# Patient Record
Sex: Female | Born: 1935
Health system: Southern US, Community
[De-identification: ages and names within clinical notes are randomized; demographics above are authoritative.]

## PROBLEM LIST (undated history)

## (undated) DIAGNOSIS — T4145XA Adverse effect of unspecified anesthetic, initial encounter: Secondary | ICD-10-CM

## (undated) DIAGNOSIS — I491 Atrial premature depolarization: Secondary | ICD-10-CM

## (undated) DIAGNOSIS — Z8619 Personal history of other infectious and parasitic diseases: Secondary | ICD-10-CM

## (undated) DIAGNOSIS — R002 Palpitations: Secondary | ICD-10-CM

## (undated) DIAGNOSIS — M1990 Unspecified osteoarthritis, unspecified site: Secondary | ICD-10-CM

## (undated) DIAGNOSIS — Z87442 Personal history of urinary calculi: Secondary | ICD-10-CM

## (undated) DIAGNOSIS — N200 Calculus of kidney: Secondary | ICD-10-CM

## (undated) DIAGNOSIS — Z8679 Personal history of other diseases of the circulatory system: Secondary | ICD-10-CM

## (undated) DIAGNOSIS — N201 Calculus of ureter: Secondary | ICD-10-CM

## (undated) DIAGNOSIS — M419 Scoliosis, unspecified: Secondary | ICD-10-CM

## (undated) DIAGNOSIS — Z972 Presence of dental prosthetic device (complete) (partial): Secondary | ICD-10-CM

## (undated) DIAGNOSIS — R251 Tremor, unspecified: Secondary | ICD-10-CM

## (undated) DIAGNOSIS — G709 Myoneural disorder, unspecified: Secondary | ICD-10-CM

## (undated) DIAGNOSIS — C801 Malignant (primary) neoplasm, unspecified: Secondary | ICD-10-CM

## (undated) DIAGNOSIS — T8859XA Other complications of anesthesia, initial encounter: Secondary | ICD-10-CM

## (undated) DIAGNOSIS — R350 Frequency of micturition: Secondary | ICD-10-CM

## (undated) DIAGNOSIS — R3915 Urgency of urination: Secondary | ICD-10-CM

## (undated) DIAGNOSIS — R5383 Other fatigue: Secondary | ICD-10-CM

## (undated) DIAGNOSIS — I10 Essential (primary) hypertension: Secondary | ICD-10-CM

## (undated) DIAGNOSIS — R911 Solitary pulmonary nodule: Secondary | ICD-10-CM

## (undated) DIAGNOSIS — J309 Allergic rhinitis, unspecified: Secondary | ICD-10-CM

## (undated) DIAGNOSIS — M545 Low back pain, unspecified: Secondary | ICD-10-CM

## (undated) DIAGNOSIS — I499 Cardiac arrhythmia, unspecified: Secondary | ICD-10-CM

## (undated) HISTORY — DX: Tremor, unspecified: R25.1

## (undated) HISTORY — DX: Scoliosis, unspecified: M41.9

## (undated) HISTORY — PX: TOTAL ABDOMINAL HYSTERECTOMY: SHX209

## (undated) HISTORY — DX: Low back pain, unspecified: M54.50

## (undated) HISTORY — DX: Other fatigue: R53.83

## (undated) HISTORY — PX: OTHER SURGICAL HISTORY: SHX169

---
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
## 2007-03-14 ENCOUNTER — Ambulatory Visit: Payer: Self-pay | Admitting: Cardiology

## 2008-10-02 HISTORY — PX: CATARACT EXTRACTION W/ INTRAOCULAR LENS  IMPLANT, BILATERAL: SHX1307

## 2011-04-10 ENCOUNTER — Other Ambulatory Visit (HOSPITAL_COMMUNITY): Payer: Self-pay | Admitting: Urology

## 2011-04-10 ENCOUNTER — Ambulatory Visit (HOSPITAL_COMMUNITY)
Admission: RE | Admit: 2011-04-10 | Discharge: 2011-04-10 | Disposition: A | Discharge status: Home / Self Care | Payer: Medicare Other | Source: Ambulatory Visit | Attending: Urology | Admitting: Urology

## 2011-04-10 DIAGNOSIS — N200 Calculus of kidney: Secondary | ICD-10-CM | POA: Insufficient documentation

## 2011-04-10 DIAGNOSIS — R109 Unspecified abdominal pain: Secondary | ICD-10-CM | POA: Insufficient documentation

## 2011-10-03 HISTORY — PX: EXTRACORPOREAL SHOCK WAVE LITHOTRIPSY: SHX1557

## 2012-02-13 HISTORY — PX: TRANSTHORACIC ECHOCARDIOGRAM: SHX275

## 2012-04-03 ENCOUNTER — Encounter: Payer: Self-pay | Admitting: Cardiology

## 2012-04-08 ENCOUNTER — Ambulatory Visit: Payer: Medicare Other | Admitting: Cardiology

## 2012-05-06 ENCOUNTER — Ambulatory Visit (INDEPENDENT_AMBULATORY_CARE_PROVIDER_SITE_OTHER): Payer: Medicare Other | Admitting: Cardiology

## 2012-05-06 ENCOUNTER — Encounter: Payer: Self-pay | Admitting: Cardiology

## 2012-05-06 VITALS — BP 180/80 | HR 62 | Ht 66.0 in | Wt 157.0 lb

## 2012-05-06 DIAGNOSIS — I491 Atrial premature depolarization: Secondary | ICD-10-CM

## 2012-05-06 DIAGNOSIS — I4891 Unspecified atrial fibrillation: Secondary | ICD-10-CM

## 2012-05-06 DIAGNOSIS — R002 Palpitations: Secondary | ICD-10-CM | POA: Insufficient documentation

## 2012-05-06 NOTE — Patient Instructions (Signed)
   24 hour holter monitor - applied today  Office will contact with results Follow up as needed

## 2012-05-06 NOTE — Progress Notes (Signed)
HPI The patient presents for evaluation of atrial fibrillation. She had this transiently while she was hospitalized recently for management of nephrolithiasis and bacteremia. I reviewed these hospital records. She developed bacteremia and sepsis. During her acute illness she did have an episode of atrial fibrillation with rapid ventricular rate. However, this converted quickly after treatment with Cardizem. She did have an echocardiogram which demonstrated a well preserved ejection fraction and no evidence of significant valvular abnormalities or wall motion abnormalities.  Since going home she has had no sustained tachypalpitations. However, she has had occasional skipped beats. She thinks that at times these might wake her from her sleep very she's experiencing them during this visit and was noted to have PACs on her EKG. She describes them as fleeting skipped beats. She does not describe presyncope or syncope. She's not been having any chest pressure, neck or arm discomfort. She's not been having any palpitations, presyncope or syncope. She hasn't been overly active since her hospitalization but she does her chores of daily living without symptoms. She has no shortness of breath, PND or orthopnea. She's had no weight gain or edema.  No Known Allergies  Current Outpatient Prescriptions  Medication Sig Dispense Refill  . atenolol (TENORMIN) 50 MG tablet Take 50 mg by mouth 2 (two) times daily.       . cholecalciferol (VITAMIN D) 1000 UNITS tablet Take 1,000 Units by mouth daily.      . fluticasone (FLONASE) 50 MCG/ACT nasal spray Place 2 sprays into the nose daily.      . hydrochlorothiazide (HYDRODIURIL) 25 MG tablet Take 25 mg by mouth daily.      . Multiple Vitamins-Minerals (MULTIVITAMIN WITH MINERALS) tablet Take 1 tablet by mouth daily.        Past Medical History  Diagnosis Date  . Calculus of kidney   . Acute pyelonephritis without lesion of renal medullary necrosis   . Atrial  fibrillation   . Anemia, unspecified     Past Surgical History  Procedure Date  . Cataract extraction   . Abdominal hysterectomy     Family History  Problem Relation Age of Onset  . Cancer      Lung and bladder  . Cancer      "Bone marrow cancer"    History   Social History  . Marital Status: Married    Spouse Name: N/A    Number of Children: 1  . Years of Education: N/A   Occupational History  .     Social History Main Topics  . Smoking status: Never Smoker   . Smokeless tobacco: Not on file  . Alcohol Use: Not on file  . Drug Use: Not on file  . Sexually Active: Not on file   Other Topics Concern  . Not on file   Social History Narrative  . No narrative on file    ROS:  Positive for seasonal allergies, frequent headaches, pain and swelling in her legs, fatigue. Otherwise as stated in the HPI and negative for all other systems.  PHYSICAL EXAM BP 180/80  Pulse 62  Ht 5\' 6"  (1.676 m)  Wt 157 lb (71.215 kg)  BMI 25.34 kg/m2 GENERAL:  Well appearing HEENT:  Pupils equal round and reactive, fundi not visualized, oral mucosa unremarkable NECK:  No jugular venous distention, waveform within normal limits, carotid upstroke brisk and symmetric, no bruits, no thyromegaly LYMPHATICS:  No cervical, inguinal adenopathy LUNGS:  Clear to auscultation bilaterally BACK:  No CVA tenderness CHEST:  Unremarkable HEART:  PMI not displaced or sustained,S1 and S2 within normal limits, no S3, no S4, no clicks, no rubs, no murmurs ABD:  Flat, positive bowel sounds normal in frequency in pitch, no bruits, no rebound, no guarding, no midline pulsatile mass, no hepatomegaly, no splenomegaly EXT:  2 plus pulses throughout, no edema, no cyanosis no clubbing SKIN:  No rashes no nodules NEURO:  Cranial nerves II through XII grossly intact, motor grossly intact throughout PSYCH:  Cognitively intact, oriented to person place and time  EKG:  Normal sinus rhythm, rate 60 to, axis within  normal limits, intervals within normal limits, premature atrial contractions, no acute ST-T wave changes. 05/06/2012  ASSESSMENT AND PLAN  Atrial fibrillation:  I suspect this is related to her acute illness. I'm going to apply a 24-hour monitor which will certainly be an imperfect screening for atrial fibrillation but will be helpful in quantifying her PACs. At this point there is no indication for anticoagulation for further evaluation.  PACs:  As above  Hypertension:  We discussed this and the need for a blood pressure diary. She'll let me know she is seeing frequent blood pressures above 1/90.

## 2013-10-02 HISTORY — PX: BACK SURGERY: SHX140

## 2014-08-09 ENCOUNTER — Inpatient Hospital Stay (HOSPITAL_COMMUNITY): Payer: Medicare HMO | Admitting: Registered Nurse

## 2014-08-09 ENCOUNTER — Encounter (HOSPITAL_COMMUNITY)
Admission: EM | Disposition: A | Discharge status: Home / Self Care | Payer: Self-pay | Source: Home / Self Care | Attending: Urology

## 2014-08-09 ENCOUNTER — Encounter (HOSPITAL_COMMUNITY): Payer: Self-pay | Admitting: Emergency Medicine

## 2014-08-09 ENCOUNTER — Observation Stay (HOSPITAL_COMMUNITY)
Admission: EM | Admit: 2014-08-09 | Discharge: 2014-08-10 | Disposition: A | Discharge status: Home / Self Care | Payer: Medicare HMO | Attending: Urology | Admitting: Urology

## 2014-08-09 ENCOUNTER — Emergency Department (HOSPITAL_COMMUNITY): Payer: Medicare HMO

## 2014-08-09 DIAGNOSIS — I4891 Unspecified atrial fibrillation: Secondary | ICD-10-CM | POA: Insufficient documentation

## 2014-08-09 DIAGNOSIS — N132 Hydronephrosis with renal and ureteral calculous obstruction: Secondary | ICD-10-CM | POA: Diagnosis not present

## 2014-08-09 DIAGNOSIS — Z79899 Other long term (current) drug therapy: Secondary | ICD-10-CM | POA: Insufficient documentation

## 2014-08-09 DIAGNOSIS — N200 Calculus of kidney: Secondary | ICD-10-CM

## 2014-08-09 DIAGNOSIS — D649 Anemia, unspecified: Secondary | ICD-10-CM | POA: Diagnosis not present

## 2014-08-09 DIAGNOSIS — N201 Calculus of ureter: Secondary | ICD-10-CM

## 2014-08-09 DIAGNOSIS — R109 Unspecified abdominal pain: Secondary | ICD-10-CM

## 2014-08-09 DIAGNOSIS — N12 Tubulo-interstitial nephritis, not specified as acute or chronic: Secondary | ICD-10-CM | POA: Insufficient documentation

## 2014-08-09 HISTORY — PX: CYSTOSCOPY/RETROGRADE/URETEROSCOPY: SHX5316

## 2014-08-09 LAB — URINALYSIS, ROUTINE W REFLEX MICROSCOPIC
Bilirubin Urine: NEGATIVE
Glucose, UA: NEGATIVE mg/dL
KETONES UR: NEGATIVE mg/dL
Nitrite: NEGATIVE
PROTEIN: 30 mg/dL — AB
Specific Gravity, Urine: 1.025 (ref 1.005–1.030)
Urobilinogen, UA: 0.2 mg/dL (ref 0.0–1.0)
pH: 6 (ref 5.0–8.0)

## 2014-08-09 LAB — CBC WITH DIFFERENTIAL/PLATELET
BASOS PCT: 0 % (ref 0–1)
Basophils Absolute: 0 10*3/uL (ref 0.0–0.1)
Eosinophils Absolute: 0 10*3/uL (ref 0.0–0.7)
Eosinophils Relative: 0 % (ref 0–5)
HCT: 38.9 % (ref 36.0–46.0)
Hemoglobin: 13.6 g/dL (ref 12.0–15.0)
Lymphocytes Relative: 9 % — ABNORMAL LOW (ref 12–46)
Lymphs Abs: 1.2 10*3/uL (ref 0.7–4.0)
MCH: 34.3 pg — ABNORMAL HIGH (ref 26.0–34.0)
MCHC: 35 g/dL (ref 30.0–36.0)
MCV: 98 fL (ref 78.0–100.0)
Monocytes Absolute: 1.3 10*3/uL — ABNORMAL HIGH (ref 0.1–1.0)
Monocytes Relative: 10 % (ref 3–12)
Neutro Abs: 10.5 10*3/uL — ABNORMAL HIGH (ref 1.7–7.7)
Neutrophils Relative %: 81 % — ABNORMAL HIGH (ref 43–77)
PLATELETS: 235 10*3/uL (ref 150–400)
RBC: 3.97 MIL/uL (ref 3.87–5.11)
RDW: 12.5 % (ref 11.5–15.5)
WBC: 13.1 10*3/uL — AB (ref 4.0–10.5)

## 2014-08-09 LAB — BASIC METABOLIC PANEL
Anion gap: 13 (ref 5–15)
BUN: 11 mg/dL (ref 6–23)
CALCIUM: 9.2 mg/dL (ref 8.4–10.5)
CO2: 25 mEq/L (ref 19–32)
CREATININE: 0.91 mg/dL (ref 0.50–1.10)
Chloride: 92 mEq/L — ABNORMAL LOW (ref 96–112)
GFR calc Af Amer: 69 mL/min — ABNORMAL LOW (ref >90)
GFR, EST NON AFRICAN AMERICAN: 59 mL/min — AB (ref >90)
Glucose, Bld: 143 mg/dL — ABNORMAL HIGH (ref 70–99)
Potassium: 3.1 mEq/L — ABNORMAL LOW (ref 3.7–5.3)
SODIUM: 130 meq/L — AB (ref 137–147)

## 2014-08-09 LAB — CBC
HCT: 37 % (ref 36.0–46.0)
Hemoglobin: 12.7 g/dL (ref 12.0–15.0)
MCH: 33.3 pg (ref 26.0–34.0)
MCHC: 34.3 g/dL (ref 30.0–36.0)
MCV: 97.1 fL (ref 78.0–100.0)
PLATELETS: 221 10*3/uL (ref 150–400)
RBC: 3.81 MIL/uL — ABNORMAL LOW (ref 3.87–5.11)
RDW: 12.5 % (ref 11.5–15.5)
WBC: 12.3 10*3/uL — ABNORMAL HIGH (ref 4.0–10.5)

## 2014-08-09 LAB — URINE MICROSCOPIC-ADD ON

## 2014-08-09 LAB — I-STAT CG4 LACTIC ACID, ED: LACTIC ACID, VENOUS: 1.69 mmol/L (ref 0.5–2.2)

## 2014-08-09 LAB — CREATININE, SERUM
CREATININE: 0.79 mg/dL (ref 0.50–1.10)
GFR calc Af Amer: >90 mL/min (ref >90)
GFR, EST NON AFRICAN AMERICAN: 78 mL/min — AB (ref >90)

## 2014-08-09 SURGERY — CYSTOSCOPY/RETROGRADE/URETEROSCOPY
Anesthesia: Monitor Anesthesia Care | Site: Ureter | Laterality: Left

## 2014-08-09 MED ORDER — DOCUSATE SODIUM 100 MG PO CAPS
100.0000 mg | ORAL_CAPSULE | Freq: Two times a day (BID) | ORAL | Status: DC
  Administered 2014-08-09 – 2014-08-10 (×2): 100 mg via ORAL
  Filled 2014-08-09 (×2): qty 1

## 2014-08-09 MED ORDER — ONDANSETRON HCL 4 MG/2ML IJ SOLN
4.0000 mg | Freq: Once | INTRAMUSCULAR | Status: AC
  Administered 2014-08-09: 4 mg via INTRAVENOUS
  Filled 2014-08-09: qty 2

## 2014-08-09 MED ORDER — KCL IN DEXTROSE-NACL 20-5-0.45 MEQ/L-%-% IV SOLN
INTRAVENOUS | Status: AC
  Filled 2014-08-09: qty 1000

## 2014-08-09 MED ORDER — HEPARIN SODIUM (PORCINE) 5000 UNIT/ML IJ SOLN
5000.0000 [IU] | Freq: Three times a day (TID) | INTRAMUSCULAR | Status: DC
  Administered 2014-08-09 – 2014-08-10 (×2): 5000 [IU] via SUBCUTANEOUS
  Filled 2014-08-09 (×3): qty 1

## 2014-08-09 MED ORDER — OXYBUTYNIN CHLORIDE 5 MG PO TABS
5.0000 mg | ORAL_TABLET | Freq: Three times a day (TID) | ORAL | Status: DC | PRN
  Filled 2014-08-09: qty 1

## 2014-08-09 MED ORDER — DEXTROSE 5 % IV SOLN
1.0000 g | Freq: Once | INTRAVENOUS | Status: AC
  Administered 2014-08-09: 1 g via INTRAVENOUS
  Filled 2014-08-09: qty 10

## 2014-08-09 MED ORDER — LIDOCAINE HCL 2 % EX GEL
CUTANEOUS | Status: DC | PRN
  Administered 2014-08-09: 1 via URETHRAL

## 2014-08-09 MED ORDER — HYDROMORPHONE HCL 1 MG/ML IJ SOLN
0.5000 mg | INTRAMUSCULAR | Status: AC | PRN
  Administered 2014-08-09 (×2): 0.5 mg via INTRAVENOUS
  Filled 2014-08-09 (×2): qty 1

## 2014-08-09 MED ORDER — SODIUM CHLORIDE 0.9 % IR SOLN
Status: DC | PRN
  Administered 2014-08-09: 3000 mL

## 2014-08-09 MED ORDER — DEXAMETHASONE SODIUM PHOSPHATE 10 MG/ML IJ SOLN
INTRAMUSCULAR | Status: DC | PRN
  Administered 2014-08-09: 10 mg via INTRAVENOUS

## 2014-08-09 MED ORDER — FENTANYL CITRATE 0.05 MG/ML IJ SOLN: 25.0000 ug | INTRAMUSCULAR | Status: DC | PRN

## 2014-08-09 MED ORDER — IOHEXOL 300 MG/ML  SOLN
INTRAMUSCULAR | Status: DC | PRN
  Administered 2014-08-09: 10 mL

## 2014-08-09 MED ORDER — DEXTROSE 5 % IV SOLN
1.0000 g | INTRAVENOUS | Status: DC
  Administered 2014-08-09: 1 g via INTRAVENOUS
  Filled 2014-08-09: qty 10

## 2014-08-09 MED ORDER — LACTATED RINGERS IV SOLN: INTRAVENOUS | Status: DC

## 2014-08-09 MED ORDER — ATENOLOL 50 MG PO TABS
50.0000 mg | ORAL_TABLET | Freq: Two times a day (BID) | ORAL | Status: DC
  Administered 2014-08-09 – 2014-08-10 (×2): 50 mg via ORAL
  Filled 2014-08-09 (×2): qty 1

## 2014-08-09 MED ORDER — HYDROCODONE-ACETAMINOPHEN 5-325 MG PO TABS
1.0000 | ORAL_TABLET | ORAL | Status: DC | PRN
  Administered 2014-08-10: 1 via ORAL
  Filled 2014-08-09: qty 2
  Filled 2014-08-09: qty 1

## 2014-08-09 MED ORDER — LIDOCAINE HCL 2 % EX GEL
CUTANEOUS | Status: AC
  Filled 2014-08-09: qty 10

## 2014-08-09 MED ORDER — MORPHINE SULFATE 2 MG/ML IJ SOLN
2.0000 mg | INTRAMUSCULAR | Status: DC | PRN
  Administered 2014-08-09: 2 mg via INTRAVENOUS
  Filled 2014-08-09: qty 1

## 2014-08-09 MED ORDER — PROMETHAZINE HCL 25 MG/ML IJ SOLN
12.5000 mg | Freq: Once | INTRAMUSCULAR | Status: AC
  Administered 2014-08-09: 12.5 mg via INTRAVENOUS
  Filled 2014-08-09: qty 1

## 2014-08-09 MED ORDER — ONDANSETRON HCL 4 MG/2ML IJ SOLN
4.0000 mg | INTRAMUSCULAR | Status: DC | PRN
Start: 2014-08-09 — End: 2014-08-10

## 2014-08-09 MED ORDER — KCL IN DEXTROSE-NACL 20-5-0.45 MEQ/L-%-% IV SOLN
INTRAVENOUS | Status: DC
  Administered 2014-08-10: 05:00:00 via INTRAVENOUS
  Filled 2014-08-09 (×3): qty 1000

## 2014-08-09 MED ORDER — PROPOFOL INFUSION 10 MG/ML OPTIME
INTRAVENOUS | Status: DC | PRN
  Administered 2014-08-09: 300 ug/kg/min via INTRAVENOUS

## 2014-08-09 MED ORDER — LACTATED RINGERS IV SOLN
INTRAVENOUS | Status: DC | PRN
  Administered 2014-08-09: 18:00:00 via INTRAVENOUS

## 2014-08-09 MED ORDER — HYDROCHLOROTHIAZIDE 25 MG PO TABS
25.0000 mg | ORAL_TABLET | Freq: Every day | ORAL | Status: DC
  Administered 2014-08-10: 25 mg via ORAL
  Filled 2014-08-09: qty 1

## 2014-08-09 MED ORDER — ACETAMINOPHEN 650 MG RE SUPP
650.0000 mg | Freq: Once | RECTAL | Status: AC
  Administered 2014-08-09: 650 mg via RECTAL
  Filled 2014-08-09: qty 1

## 2014-08-09 SURGICAL SUPPLY — 16 items
BAG URO CATCHER STRL LF (DRAPE) ×3 IMPLANT
BASKET ZERO TIP NITINOL 2.4FR (BASKET) IMPLANT
BSKT STON RTRVL ZERO TP 2.4FR (BASKET)
CATH INTERMIT  6FR 70CM (CATHETERS) ×2 IMPLANT
CATH URET 5FR 28IN CONE TIP (BALLOONS)
CATH URET 5FR 70CM CONE TIP (BALLOONS) IMPLANT
DRAPE CAMERA CLOSED 9X96 (DRAPES) ×3 IMPLANT
GLOVE BIOGEL M STRL SZ7.5 (GLOVE) ×3 IMPLANT
GOWN STRL REUS W/TWL LRG LVL3 (GOWN DISPOSABLE) ×3 IMPLANT
GOWN STRL REUS W/TWL XL LVL3 (GOWN DISPOSABLE) ×3 IMPLANT
MANIFOLD NEPTUNE II (INSTRUMENTS) ×3 IMPLANT
PACK CYSTO (CUSTOM PROCEDURE TRAY) ×3 IMPLANT
STENT CONTOUR 7FRX24 (STENTS) ×2 IMPLANT
TUBING CONNECTING 10 (TUBING) ×1 IMPLANT
TUBING CONNECTING 10' (TUBING) ×1
WIRE COONS/BENSON .038X145CM (WIRE) ×3 IMPLANT

## 2014-08-09 NOTE — Anesthesia Postprocedure Evaluation (Signed)
  Anesthesia Post-op Note  Patient: Tanya Mueller  Procedure(s) Performed: Procedure(s) (LRB): CYSTOSCOPY/RETROGRADE/LEFT STENT (Left)  Patient Location: PACU  Anesthesia Type: MAC  Level of Consciousness: awake and alert   Airway and Oxygen Therapy: Patient Spontanous Breathing  Post-op Pain: mild  Post-op Assessment: Post-op Vital signs reviewed, Patient's Cardiovascular Status Stable, Respiratory Function Stable, Patent Airway and No signs of Nausea or vomiting  Last Vitals:  Filed Vitals:   08/09/14 1845  BP: 135/48  Pulse: 69  Temp: 38.2 C  Resp: 17    Post-op Vital Signs: stable   Complications: No apparent anesthesia complications

## 2014-08-09 NOTE — ED Notes (Signed)
RN spoke with Seth Bake at [facility], states pt going directly to OR. [phone]. Information given to [facility].

## 2014-08-09 NOTE — ED Notes (Signed)
Contact Seth Bake @ [phone] when Karen Chafe is on the way.

## 2014-08-09 NOTE — ED Notes (Signed)
Pt ambulated to restroom with standby assist. Upon returning to room pt vomited small amount of yellow/brown emesis. Zofran given. VSS. nad noted.

## 2014-08-09 NOTE — ED Notes (Signed)
Opened chart at request of OR in order to assist with transfer from [facility] to OR.

## 2014-08-09 NOTE — Anesthesia Preprocedure Evaluation (Addendum)
Anesthesia Evaluation  Patient identified by MRN, date of birth, ID band Patient awake    Reviewed: Allergy & Precautions, H&P , NPO status , Patient's Chart, lab work & pertinent test results, reviewed documented beta blocker date and time   Airway Mallampati: II  TM Distance: >3 FB Neck ROM: full    Dental  (+) Missing, Dental Advisory Given Lower partial:   Pulmonary neg pulmonary ROS,  breath sounds clear to auscultation  Pulmonary exam normal       Cardiovascular Exercise Tolerance: Good + dysrhythmias Atrial Fibrillation Rhythm:regular Rate:Normal  ECG NSR   Neuro/Psych negative neurological ROS  negative psych ROS   GI/Hepatic negative GI ROS, Neg liver ROS,   Endo/Other  negative endocrine ROS  Renal/GU negative Renal ROS  negative genitourinary   Musculoskeletal   Abdominal   Peds  Hematology negative hematology ROS (+)   Anesthesia Other Findings   Reproductive/Obstetrics negative OB ROS                            Anesthesia Physical Anesthesia Plan  ASA: III  Anesthesia Plan: MAC   Post-op Pain Management:    Induction:   Airway Management Planned: Simple Face Mask  Additional Equipment:   Intra-op Plan:   Post-operative Plan:   Informed Consent: I have reviewed the patients History and Physical, chart, labs and discussed the procedure including the risks, benefits and alternatives for the proposed anesthesia with the patient or authorized representative who has indicated his/her understanding and acceptance.   Dental Advisory Given  Plan Discussed with: CRNA and Surgeon  Anesthesia Plan Comments:        Anesthesia Quick Evaluation

## 2014-08-09 NOTE — ED Notes (Addendum)
Pt reports posterior head pain of 6/10. Pt denies headache. Pt reports lateral and posterior neck pain of 6/10. Pt reports generalized weakness and nausea. Pt denies vomiting.

## 2014-08-09 NOTE — ED Provider Notes (Addendum)
CSN: [id number]     Arrival date & time 08/09/14  1026 History  This chart was scribed for Tanna Furry, MD by Martinique Peace, ED Scribe. The patient was seen in [place]. The patient's care was started at 11:35 AM.    Chief Complaint  Patient presents with  . Weakness      Patient is a 79 y.o. female presenting with weakness. The history is provided by the patient. No language interpreter was used.  Weakness Associated symptoms include abdominal pain. Pertinent negatives include no chest pain, no headaches and no shortness of breath.  HPI Comments: Tanya Mueller is a 79 y.o. female who presents to the Emergency Department complaining of generalized weakness and malaise  onset past week with associated fever (max: 103), diaphoresis, and lack of appetite. Pt reports she saw her PCP yesterday and was diagnosed with UTI which she was given antibiotic to treat (Macrobid). . She notes odor to urine. Reports history of kidney stones. Pt also complains of pain to posterior aspect of head and neck rated as 6/10. She states that she may be allergic to Morphine. No neck stiffness. Temperature up to 102 at home.   Past Medical History  Diagnosis Date  . Calculus of kidney   . Acute pyelonephritis without lesion of renal medullary necrosis   . Atrial fibrillation   . Anemia, unspecified    Past Surgical History  Procedure Laterality Date  . Cataract extraction    . Abdominal hysterectomy     Family History  Problem Relation Age of Onset  . Cancer      Lung and bladder  . Cancer      "Bone marrow cancer"   History  Substance Use Topics  . Smoking status: Never Smoker   . Smokeless tobacco: Not on file  . Alcohol Use: No   OB History    No data available     Review of Systems  Constitutional: Positive for fever, diaphoresis, appetite change and fatigue. Negative for chills.  HENT: Negative for mouth sores, sore throat and trouble swallowing.   Eyes: Negative for visual disturbance.   Respiratory: Negative for cough, chest tightness, shortness of breath and wheezing.   Cardiovascular: Negative for chest pain.  Gastrointestinal: Positive for abdominal pain. Negative for nausea, vomiting, diarrhea, blood in stool and abdominal distention.  Endocrine: Negative for polydipsia, polyphagia and polyuria.  Genitourinary: Negative for dysuria, frequency and hematuria.  Musculoskeletal: Positive for neck pain. Negative for gait problem.  Skin: Negative for color change, pallor and rash.  Neurological: Positive for weakness. Negative for dizziness, syncope, light-headedness and headaches.  Hematological: Does not bruise/bleed easily.  Psychiatric/Behavioral: Negative for behavioral problems and confusion.      Allergies  Review of patient's allergies indicates no known allergies.  Home Medications   Prior to Admission medications   Medication Sig Start Date End Date Taking? Authorizing Provider  acetaminophen-codeine (TYLENOL #3) 300-30 MG per tablet Take 1 tablet by mouth 4 (four) times daily as needed (pain).   Yes Historical Provider, MD  atenolol (TENORMIN) 50 MG tablet Take 50 mg by mouth 2 (two) times daily.    Yes Historical Provider, MD  cholecalciferol (VITAMIN D) 1000 UNITS tablet Take 1,000 Units by mouth daily.   Yes Historical Provider, MD  hydrochlorothiazide (HYDRODIURIL) 25 MG tablet Take 25 mg by mouth daily.   Yes Historical Provider, MD  Multiple Vitamins-Minerals (MULTIVITAMIN WITH MINERALS) tablet Take 1 tablet by mouth daily.   Yes Historical Provider, MD  nitrofurantoin, macrocrystal-monohydrate, (MACROBID) 100 MG capsule Take 100 mg by mouth 2 (two) times daily. Started 08/08/14   Yes Historical Provider, MD  ondansetron (ZOFRAN-ODT) 8 MG disintegrating tablet Take 8 mg by mouth every 6 (six) hours as needed for nausea.    Yes Historical Provider, MD  fluticasone (FLONASE) 50 MCG/ACT nasal spray Place 2 sprays into the nose daily as needed for allergies.      Historical Provider, MD   BP 129/101 mmHg  Pulse 71  Temp(Src) 99.3 F (37.4 C) (Oral)  Resp 12  Ht 5\' 5"  (1.651 m)  Wt 160 lb (72.576 kg)  BMI 26.63 kg/m2  SpO2 95% Physical Exam  Constitutional: She is oriented to person, place, and time. She appears well-developed and well-nourished. No distress.  HENT:  Head: Normocephalic.  Left ear effusion.   Eyes: Conjunctivae are normal. Pupils are equal, round, and reactive to light. No scleral icterus.  Neck: Normal range of motion. Neck supple. No thyromegaly present.  Neck pain but freely mobile.   Cardiovascular: Normal rate and regular rhythm.  Exam reveals no gallop and no friction rub.   No murmur heard. Pulmonary/Chest: Effort normal and breath sounds normal. No respiratory distress. She has no wheezes. She has no rales.  Abdominal: Soft. Bowel sounds are normal. She exhibits no distension. There is no tenderness. There is no rebound and no guarding.  Left lower quadrant tenderness.   Musculoskeletal: Normal range of motion.  Neurological: She is alert and oriented to person, place, and time.  Skin: Skin is warm and dry. No rash noted.  Psychiatric: She has a normal mood and affect. Her behavior is normal.    ED Course  Procedures (including critical care time) Labs Review Labs Reviewed  URINALYSIS, ROUTINE W REFLEX MICROSCOPIC - Abnormal; Notable for the following:    Color, Urine AMBER (*)    APPearance CLOUDY (*)    Hgb urine dipstick TRACE (*)    Protein, ur 30 (*)    Leukocytes, UA SMALL (*)    All other components within normal limits  CBC WITH DIFFERENTIAL - Abnormal; Notable for the following:    WBC 13.1 (*)    MCH 34.3 (*)    Neutrophils Relative % 81 (*)    Neutro Abs 10.5 (*)    Lymphocytes Relative 9 (*)    Monocytes Absolute 1.3 (*)    All other components within normal limits  BASIC METABOLIC PANEL - Abnormal; Notable for the following:    Sodium 130 (*)    Potassium 3.1 (*)    Chloride 92 (*)     Glucose, Bld 143 (*)    GFR calc non Af Amer 59 (*)    GFR calc Af Amer 69 (*)    All other components within normal limits  URINE MICROSCOPIC-ADD ON - Abnormal; Notable for the following:    Squamous Epithelial / LPF FEW (*)    Bacteria, UA MANY (*)    All other components within normal limits  I-STAT CG4 LACTIC ACID, ED    Imaging Review Ct Renal Stone Study  08/09/2014   CLINICAL DATA:  79 year old female with fever, decreased appetite and generalized weakness an Chad is. Reports past history of kidney stones.  EXAM: CT ABDOMEN AND PELVIS WITHOUT CONTRAST  TECHNIQUE: Multidetector CT imaging of the abdomen and pelvis was performed following the standard protocol without IV contrast.  COMPARISON:  CT scan of the pelvis 05/21/2014; prior CT of the abdomen 03/13/2012  FINDINGS: Lower Chest:  Dependent and subsegmental atelectasis in the lower lungs. Stable 3 mm pulmonary nodule in the periphery of the right middle lobe dating back to June of 2013. Two year stability is consistent with benignity. No other suspicious pulmonary nodules or masses. Visualized heart within normal limits for size. No pericardial effusion. Small hiatal hernia.  Abdomen: Unenhanced CT was performed per clinician order. Lack of IV contrast limits sensitivity and specificity, especially for evaluation of abdominal/pelvic solid viscera. Within these limitations, unremarkable CT appearance of the stomach, duodenum, spleen, adrenal glands and pancreas. Normal hepatic contour and morphology. No discrete hepatic lesion. Gallbladder is unremarkable. No intra or extrahepatic biliary ductal dilatation.  Moderate to severe left hydronephrosis secondary to a 7.5 mm obstructing stone in the proximal left ureter. There is associated renal edema and perinephric stranding. Additional nephrolithiasis present in the lower pole of the left kidney. The largest remaining stone measures 7 mm. Abnormal contour of the right kidney likely represents  the sequelae of the prior large subcapsular hematoma. No definite mass slight contour abnormality on the sagittal reformatted images. No evidence of right hydronephrosis or nephrolithiasis.  No evidence of focal bowel wall thickening or obstruction. The sigmoid colon is redundant. No free fluid or suspicious adenopathy.  Pelvis: Surgical changes of prior hysterectomy. Mild pelvic floor laxity. Unremarkable appearance of the bladder. No free fluid or suspicious adenopathy.  Bones/Soft Tissues: No acute fracture or aggressive appearing lytic or blastic osseous lesion. Multilevel degenerative disc disease most focal at L4-L5.  Vascular: Limited evaluation in the absence of intravenous contrast. Mild scattered atherosclerotic vascular calcifications without evidence of aneurysmal dilatation.  IMPRESSION: 1. Obstructing 7.5 x 9 mm stone in the proximal left ureter resulting in moderate to severe left hydronephrosis, renal edema and perinephric stranding. 2. Additional nonobstructing nephrolithiasis in the left lower pole collecting system. 3. Abnormal configuration and fullness within the central aspect of the right kidney which is incompletely evaluated in the absence of intravenous contrast material. However, on the prior CT scan dated 03/13/2012, this kidney was affected with a relatively large subcapsular hematoma. The current configuration the kidney is favored to represent the sequelae of the largely resolved subcapsular hematoma. 4. Small hiatal hernia. 5. Atherosclerotic vascular calcifications.   Electronically Signed   By: Jacqulynn Cadet M.D.   On: 08/09/2014 13:01     EKG Interpretation None     Medications  cefTRIAXone (ROCEPHIN) 1 g in dextrose 5 % 50 mL IVPB (1 g Intravenous New Bag/Given 08/09/14 1327)  HYDROmorphone (DILAUDID) injection 0.5 mg (0.5 mg Intravenous Given 08/09/14 1338)  ondansetron (ZOFRAN) injection 4 mg (4 mg Intravenous Given 08/09/14 1219)  ondansetron (ZOFRAN) injection 4 mg  (4 mg Intravenous Given 08/09/14 1300)    11:41 AM- Treatment plan was discussed with patient who verbalizes understanding and agrees.   MDM   Final diagnoses:  Flank pain  Kidney stone  Pyelonephritis    Findings discussed at length with patient and her family. I placed a call to [facility]. Patient follows with Dr. Larwance Sachs. Dr. Risa Grill returned my call. I presented her as a patient with urinary tract infection and flank pain with kidney stone. Hemodynamically stable and not currently septic. Dr. Risa Grill request transfer to [facility] long for stent placement. I think this is perfectly appropriate. Arrangements being made for transfer at this time.  I personally performed the services described in this documentation, which was scribed in my presence. The recorded information has been reviewed and is accurate.   Tanna Furry, MD  08/09/14 [place], MD 08/09/14 1354

## 2014-08-09 NOTE — Op Note (Signed)
Preoperative diagnosis:left proximal ureteral calculus 9 mm with febrile UTI Postoperative diagnosis:same  Procedure:cystoscopy, left retrograde pyelogram, left double-J stent placement 7 French 24 cm   Surgeon: Bernestine Amass M.D.  Anesthesia: Gen.  Indications:79 year old female with prior history of nephrolithiasis. She presented with fever lethargy and flank discomfort. CT revealed a nonobstructing 9 mm left proximal ureteral stone with high-grade obstruction and concurrent febrile UTI. She presents now for semi-emergent decompression.     Technique and findings:patient was brought to the operating room. There she received some IV sedation with monitoring. She was placed in lithotomy position and prepped and draped in usual manner. Appropriate surgical timeout was performed. Cystoscopy revealed some edema and erythema of the trigone consistent with cystitis. Left ureteral orifice was cannulated with an open-ended catheter. Retrograde pyelogram showed a 9 mm filling defect in the proximal left ureter causing high-grade obstruction with dilation of the calyceal system and renal pelvis. A guidewire was placed up to the renal pelvis.  The retrograde was performed with fluoroscopic interpretation. Over the guidewire I placed a 7 French 24 cm double-J stent with good positioning confirmed visually as well as with fluoroscopy. The bladder was emptied. She was brought to recovery room in stable condition.

## 2014-08-09 NOTE — ED Notes (Signed)
Pt reports headache x1 week. Pt reports was seen for same at pcp and diagnosed with UTI. Pt reports no relief in symptoms. Pt reports generalized weakness and malaise.

## 2014-08-09 NOTE — H&P (Signed)
Tanya Mueller is an 79 y.o. female.   Chief Complaint: newly diagnosed left proximal ureteral calculus with concurrent febrile urinary tract infection HPI: Tanya Mueller is 79 years of age. She has a long-standing history of recurrent nephrolithiasis. She was previously cared for by an outside urologist in [facility]. She more recently has been under the care of Tanya Mueller. She presented to the [facility] emergency room complaining of weakness with malaise and fever to as high as 103. She had shaking chills and was diaphoretic. She had significant nausea. She apparently was seen by her primary care physician yesterday and diagnosed with a urinary tract infection. She was given Macrobid. She was noted to be 99.3 in the emergency room with stable vital signs. She was not grossly uroseptic. Urinalysis did show too numerous to count white blood cells with many bacteria. White blood cell count was elevated at 13.1 thousand. Creatinine was 0.9. CT imaging revealed an obstructing 8 x 9 mm stone in the left proximal ureter causing moderate to severe hydronephrosis with what appeared to be renal edema and some perinephric inflammatory changes. Additional nonobstructing stones were noted in the lower pole the left kidney.urine culture was apparently obtained in the emergency room and the patient was given 1 g of Rocephin several hours ago. She appears to have remained hemodynamically stable and is without gross evidence of sepsis.  Past Medical History  Diagnosis Date  . Calculus of kidney   . Acute pyelonephritis without lesion of renal medullary necrosis   . Atrial fibrillation   . Anemia, unspecified     Past Surgical History  Procedure Laterality Date  . Cataract extraction    . Abdominal hysterectomy      Family History  Problem Relation Age of Onset  . Cancer      Lung and bladder  . Cancer      "Bone marrow cancer"   Social History:  reports that she has never smoked. She  does not have any smokeless tobacco history on file. She reports that she does not drink alcohol or use illicit drugs.  Allergies: No Known Allergies  Medications Prior to Admission  Medication Sig Dispense Refill  . acetaminophen-codeine (TYLENOL #3) 300-30 MG per tablet Take 1 tablet by mouth 4 (four) times daily as needed (pain).    [name] atenolol (TENORMIN) 50 MG tablet Take 50 mg by mouth 2 (two) times daily.     . cholecalciferol (VITAMIN D) 1000 UNITS tablet Take 1,000 Units by mouth daily.    . hydrochlorothiazide (HYDRODIURIL) 25 MG tablet Take 25 mg by mouth daily.    . Multiple Vitamins-Minerals (MULTIVITAMIN WITH MINERALS) tablet Take 1 tablet by mouth daily.    . nitrofurantoin, macrocrystal-monohydrate, (MACROBID) 100 MG capsule Take 100 mg by mouth 2 (two) times daily. Started 08/08/14    . ondansetron (ZOFRAN-ODT) 8 MG disintegrating tablet Take 8 mg by mouth every 6 (six) hours as needed for nausea.     . fluticasone (FLONASE) 50 MCG/ACT nasal spray Place 2 sprays into the nose daily as needed for allergies.       Results for orders placed or performed during the hospital encounter of 08/09/14 (from the past 48 hour(s))  CBC with Differential     Status: Abnormal   Collection Time: 08/09/14 12:20 PM  Result Value Ref Range   WBC 13.1 (H) 4.0 - 10.5 K/uL   RBC 3.97 3.87 - 5.11 MIL/uL   Hemoglobin 13.6 12.0 - 15.0 g/dL  HCT 38.9 36.0 - 46.0 %   MCV 98.0 78.0 - 100.0 fL   MCH 34.3 (H) 26.0 - 34.0 pg   MCHC 35.0 30.0 - 36.0 g/dL   RDW 12.5 11.5 - 15.5 %   Platelets 235 150 - 400 K/uL   Neutrophils Relative % 81 (H) 43 - 77 %   Neutro Abs 10.5 (H) 1.7 - 7.7 K/uL   Lymphocytes Relative 9 (L) 12 - 46 %   Lymphs Abs 1.2 0.7 - 4.0 K/uL   Monocytes Relative 10 3 - 12 %   Monocytes Absolute 1.3 (H) 0.1 - 1.0 K/uL   Eosinophils Relative 0 0 - 5 %   Eosinophils Absolute 0.0 0.0 - 0.7 K/uL   Basophils Relative 0 0 - 1 %   Basophils Absolute 0.0 0.0 - 0.1 K/uL  Basic metabolic  panel     Status: Abnormal   Collection Time: 08/09/14 12:20 PM  Result Value Ref Range   Sodium 130 (L) 137 - 147 mEq/L   Potassium 3.1 (L) 3.7 - 5.3 mEq/L   Chloride 92 (L) 96 - 112 mEq/L   CO2 25 19 - 32 mEq/L   Glucose, Bld 143 (H) 70 - 99 mg/dL   BUN 11 6 - 23 mg/dL   Creatinine, Ser 0.91 0.50 - 1.10 mg/dL   Calcium 9.2 8.4 - 10.5 mg/dL   GFR calc non Af Amer 59 (L) >90 mL/min   GFR calc Af Amer 69 (L) >90 mL/min    Comment: (NOTE) The eGFR has been calculated using the CKD EPI equation. This calculation has not been validated in all clinical situations. eGFR's persistently <90 mL/min signify possible Chronic Kidney Disease.    Anion gap 13 5 - 15  Urinalysis, Routine w reflex microscopic     Status: Abnormal   Collection Time: 08/09/14 12:30 PM  Result Value Ref Range   Color, Urine AMBER (A) YELLOW    Comment: BIOCHEMICALS MAY BE AFFECTED BY COLOR   APPearance CLOUDY (A) CLEAR   Specific Gravity, Urine 1.025 1.005 - 1.030   pH 6.0 5.0 - 8.0   Glucose, UA NEGATIVE NEGATIVE mg/dL   Hgb urine dipstick TRACE (A) NEGATIVE   Bilirubin Urine NEGATIVE NEGATIVE   Ketones, ur NEGATIVE NEGATIVE mg/dL   Protein, ur 30 (A) NEGATIVE mg/dL   Urobilinogen, UA 0.2 0.0 - 1.0 mg/dL   Nitrite NEGATIVE NEGATIVE   Leukocytes, UA SMALL (A) NEGATIVE  Urine microscopic-add on     Status: Abnormal   Collection Time: 08/09/14 12:30 PM  Result Value Ref Range   Squamous Epithelial / LPF FEW (A) RARE   WBC, UA TOO NUMEROUS TO COUNT <3 WBC/hpf   RBC / HPF 7-10 <3 RBC/hpf   Bacteria, UA MANY (A) RARE  I-Stat CG4 Lactic Acid, ED     Status: None   Collection Time: 08/09/14 12:32 PM  Result Value Ref Range   Lactic Acid, Venous 1.69 0.5 - 2.2 mmol/L   Ct Renal Stone Study  08/09/2014   CLINICAL DATA:  79 year old female with fever, decreased appetite and generalized weakness an Chad is. Reports past history of kidney stones.  EXAM: CT ABDOMEN AND PELVIS WITHOUT CONTRAST  TECHNIQUE:  Multidetector CT imaging of the abdomen and pelvis was performed following the standard protocol without IV contrast.  COMPARISON:  CT scan of the pelvis 05/21/2014; prior CT of the abdomen 03/13/2012  FINDINGS: Lower Chest: Dependent and subsegmental atelectasis in the lower lungs. Stable 3 mm pulmonary nodule  in the periphery of the right middle lobe dating back to June of 2013. Two year stability is consistent with benignity. No other suspicious pulmonary nodules or masses. Visualized heart within normal limits for size. No pericardial effusion. Small hiatal hernia.  Abdomen: Unenhanced CT was performed per clinician order. Lack of IV contrast limits sensitivity and specificity, especially for evaluation of abdominal/pelvic solid viscera. Within these limitations, unremarkable CT appearance of the stomach, duodenum, spleen, adrenal glands and pancreas. Normal hepatic contour and morphology. No discrete hepatic lesion. Gallbladder is unremarkable. No intra or extrahepatic biliary ductal dilatation.  Moderate to severe left hydronephrosis secondary to a 7.5 mm obstructing stone in the proximal left ureter. There is associated renal edema and perinephric stranding. Additional nephrolithiasis present in the lower pole of the left kidney. The largest remaining stone measures 7 mm. Abnormal contour of the right kidney likely represents the sequelae of the prior large subcapsular hematoma. No definite mass slight contour abnormality on the sagittal reformatted images. No evidence of right hydronephrosis or nephrolithiasis.  No evidence of focal bowel wall thickening or obstruction. The sigmoid colon is redundant. No free fluid or suspicious adenopathy.  Pelvis: Surgical changes of prior hysterectomy. Mild pelvic floor laxity. Unremarkable appearance of the bladder. No free fluid or suspicious adenopathy.  Bones/Soft Tissues: No acute fracture or aggressive appearing lytic or blastic osseous lesion. Multilevel  degenerative disc disease most focal at L4-L5.  Vascular: Limited evaluation in the absence of intravenous contrast. Mild scattered atherosclerotic vascular calcifications without evidence of aneurysmal dilatation.  IMPRESSION: 1. Obstructing 7.5 x 9 mm stone in the proximal left ureter resulting in moderate to severe left hydronephrosis, renal edema and perinephric stranding. 2. Additional nonobstructing nephrolithiasis in the left lower pole collecting system. 3. Abnormal configuration and fullness within the central aspect of the right kidney which is incompletely evaluated in the absence of intravenous contrast material. However, on the prior CT scan dated 03/13/2012, this kidney was affected with a relatively large subcapsular hematoma. The current configuration the kidney is favored to represent the sequelae of the largely resolved subcapsular hematoma. 4. Small hiatal hernia. 5. Atherosclerotic vascular calcifications.   Electronically Signed   By: Jacqulynn Cadet M.D.   On: 08/09/2014 13:01    Review of Systems - Negative except fever, chills, nausea, extreme lethargy and weakness. She is also had some left-sided abdominal and back discomfort.  Blood pressure 137/53, pulse 74, temperature 99.3 F (37.4 C), temperature source Oral, resp. rate 17, height _0  (1.651 m), weight 72.576 kg (160 lb), SpO2 93 %. General appearance: in no acute distress. She is somewhat lethargic and at least mildly confused. Neck: no adenopathy and no JVD Resp: clear to auscultation bilaterally Cardio: regular rate and rhythm GI: soft, non-tender; bowel sounds normal; no masses,  no organomegaly no significant CVA tenderness Extremities: extremities normal, atraumatic, no cyanosis or edema Skin: Skin color, texture, turgor normal. No rashes or lesions Neurologic: Grossly normal  Assessment/Plan 9 mm left proximal ureteral stone with febrile urinary tract infection. I explained to the husband and daughter that  this does require urgent intervention with decompression of left kidney. We will make no effort to attempt to manipulate the stone in any manner.the rationale for this procedure and risk and benefits discussed. If we are unable to place a double-J stent that she will require percutaneous nephrostomy tube placement. She will need to be kept on broad-spectrum antibiotics and observe. Definitive stone treatment will occur 10-14 days from now. We will  plan on admission status post attempted double-J stent placement today.  Bernie Fobes S 08/09/2014, 5:17 PM

## 2014-08-09 NOTE — Transfer of Care (Signed)
Immediate Anesthesia Transfer of Care Note  Patient: Tanya Mueller  Procedure(s) Performed: Procedure(s): CYSTOSCOPY/RETROGRADE/LEFT STENT (Left)  Patient Location: PACU  Anesthesia Type:MAC  Level of Consciousness: awake, sedated and patient cooperative  Airway & Oxygen Therapy: Patient Spontanous Breathing and Patient connected to face mask oxygen  Post-op Assessment: Report given to PACU RN, Post -op Vital signs reviewed and stable and Patient moving all extremities X 4  Post vital signs: stable  Complications: No apparent anesthesia complications

## 2014-08-10 ENCOUNTER — Encounter (HOSPITAL_COMMUNITY): Payer: Self-pay | Admitting: Urology

## 2014-08-10 LAB — CBC
HCT: 38.6 % (ref 36.0–46.0)
Hemoglobin: 13 g/dL (ref 12.0–15.0)
MCH: 32.8 pg (ref 26.0–34.0)
MCHC: 33.7 g/dL (ref 30.0–36.0)
MCV: 97.5 fL (ref 78.0–100.0)
PLATELETS: 203 10*3/uL (ref 150–400)
RBC: 3.96 MIL/uL (ref 3.87–5.11)
RDW: 12.3 % (ref 11.5–15.5)
WBC: 9.4 10*3/uL (ref 4.0–10.5)

## 2014-08-10 LAB — BASIC METABOLIC PANEL
ANION GAP: 15 (ref 5–15)
BUN: 11 mg/dL (ref 6–23)
CO2: 24 meq/L (ref 19–32)
Calcium: 8.9 mg/dL (ref 8.4–10.5)
Chloride: 92 mEq/L — ABNORMAL LOW (ref 96–112)
Creatinine, Ser: 0.7 mg/dL (ref 0.50–1.10)
GFR calc Af Amer: >90 mL/min (ref >90)
GFR calc non Af Amer: 81 mL/min — ABNORMAL LOW (ref >90)
Glucose, Bld: 187 mg/dL — ABNORMAL HIGH (ref 70–99)
POTASSIUM: 3.4 meq/L — AB (ref 3.7–5.3)
SODIUM: 131 meq/L — AB (ref 137–147)

## 2014-08-10 MED ORDER — POTASSIUM CHLORIDE ER 10 MEQ PO TBCR: 10.0000 meq | EXTENDED_RELEASE_TABLET | Freq: Every day | ORAL | Status: DC

## 2014-08-10 MED ORDER — CEPHALEXIN 500 MG PO CAPS: 500.0000 mg | ORAL_CAPSULE | Freq: Four times a day (QID) | ORAL | Status: DC

## 2014-08-10 NOTE — Discharge Summary (Signed)
Patient ID: Tanya Mueller MRN: [id number] DOB/AGE: 1935/04/11 79 y.o.  Admit date: 08/09/2014 Discharge date: 08/10/2014  Admission Diagnoses: Kidney stone [N20.0] Pyelonephritis [N12] Flank pain [R10.9]  Left hydronephrosis  Discharge Diagnoses:  Active Problems:   Kidney stone   Ureteral calculus   Discharged Condition:  Markedly improved.  Hospital Course: Patient was admitted on 11/8 with fever, left flank pain.  CT scan revealed left renal calculi. A 9 mm obstructing proximal left ureteral calculus, moderate to severe left hydronephrosis.  She had left JJ stent insertion by Dr Risa Grill.  She is doing much better today.  She does not have any fever or flank pain. She is alert and oriented.    Consults: None  Significant Diagnostic Studies: Ct Renal Stone Study  08/09/2014   CLINICAL DATA:  79 year old female with fever, decreased appetite and generalized weakness an Malay is. Reports past history of kidney stones.  EXAM: CT ABDOMEN AND PELVIS WITHOUT CONTRAST  TECHNIQUE: Multidetector CT imaging of the abdomen and pelvis was performed following the standard protocol without IV contrast.  COMPARISON:  CT scan of the pelvis 05/21/2014; prior CT of the abdomen 03/13/2012  FINDINGS: Lower Chest: Dependent and subsegmental atelectasis in the lower lungs. Stable 3 mm pulmonary nodule in the periphery of the right middle lobe dating back to June of 2013. Two year stability is consistent with benignity. No other suspicious pulmonary nodules or masses. Visualized heart within normal limits for size. No pericardial effusion. Small hiatal hernia.  Abdomen: Unenhanced CT was performed per clinician order. Lack of IV contrast limits sensitivity and specificity, especially for evaluation of abdominal/pelvic solid viscera. Within these limitations, unremarkable CT appearance of the stomach, duodenum, spleen, adrenal glands and pancreas. Normal hepatic contour and morphology. No discrete hepatic lesion.  Gallbladder is unremarkable. No intra or extrahepatic biliary ductal dilatation.  Moderate to severe left hydronephrosis secondary to a 7.5 mm obstructing stone in the proximal left ureter. There is associated renal edema and perinephric stranding. Additional nephrolithiasis present in the lower pole of the left kidney. The largest remaining stone measures 7 mm. Abnormal contour of the right kidney likely represents the sequelae of the prior large subcapsular hematoma. No definite mass slight contour abnormality on the sagittal reformatted images. No evidence of right hydronephrosis or nephrolithiasis.  No evidence of focal bowel wall thickening or obstruction. The sigmoid colon is redundant. No free fluid or suspicious adenopathy.  Pelvis: Surgical changes of prior hysterectomy. Mild pelvic floor laxity. Unremarkable appearance of the bladder. No free fluid or suspicious adenopathy.  Bones/Soft Tissues: No acute fracture or aggressive appearing lytic or blastic osseous lesion. Multilevel degenerative disc disease most focal at L4-L5.  Vascular: Limited evaluation in the absence of intravenous contrast. Mild scattered atherosclerotic vascular calcifications without evidence of aneurysmal dilatation.  IMPRESSION: 1. Obstructing 7.5 x 9 mm stone in the proximal left ureter resulting in moderate to severe left hydronephrosis, renal edema and perinephric stranding. 2. Additional nonobstructing nephrolithiasis in the left lower pole collecting system. 3. Abnormal configuration and fullness within the central aspect of the right kidney which is incompletely evaluated in the absence of intravenous contrast material. However, on the prior CT scan dated 03/13/2012, this kidney was affected with a relatively large subcapsular hematoma. The current configuration the kidney is favored to represent the sequelae of the largely resolved subcapsular hematoma. 4. Small hiatal hernia. 5. Atherosclerotic vascular calcifications.    Electronically Signed   By: Jacqulynn Cadet M.D.   On: 08/09/2014 13:01  Treatments: Cystoscopy, left JJ stent placement. IV antibiotics.  Discharge Exam: Blood pressure 125/56, pulse 54, temperature 98 F (36.7 C), temperature source Oral, resp. rate 18, height 5\' 5"  (1.651 m), weight 73.3 kg (161 lb 9.6 oz), SpO2 96 %. Abdomen: Soft, non distended, non tender.  No CVA tenderness.  Disposition: 01-Home or Self Care  Patient will be followed as outpatient.  She will have either ESL or ureteroscopic stone extraction when she cools off from her episode of pyelonephritis.  She is discharged home on cephalexin.  Urine culture is pending.  Will switch to culture sensitive antibiotics if the organism is not susceptible to cephalexin.     Medication List    STOP taking these medications        nitrofurantoin (macrocrystal-monohydrate) 100 MG capsule  Commonly known as:  MACROBID      TAKE these medications        acetaminophen-codeine 300-30 MG per tablet  Commonly known as:  TYLENOL #3  Take 1 tablet by mouth 4 (four) times daily as needed (pain).     atenolol 50 MG tablet  Commonly known as:  TENORMIN  Take 50 mg by mouth 2 (two) times daily.     cephALEXin 500 MG capsule  Commonly known as:  KEFLEX  Take 1 capsule (500 mg total) by mouth 4 (four) times daily.     cholecalciferol 1000 UNITS tablet  Commonly known as:  VITAMIN D  Take 1,000 Units by mouth daily.     fluticasone 50 MCG/ACT nasal spray  Commonly known as:  FLONASE  Place 2 sprays into the nose daily as needed for allergies.     hydrochlorothiazide 25 MG tablet  Commonly known as:  HYDRODIURIL  Take 25 mg by mouth daily.     multivitamin with minerals tablet  Take 1 tablet by mouth daily.     ondansetron 8 MG disintegrating tablet  Commonly known as:  ZOFRAN-ODT  Take 8 mg by mouth every 6 (six) hours as needed for nausea.     potassium chloride 10 MEQ tablet  Commonly known as:  K-DUR  Take 1  tablet (10 mEq total) by mouth daily.         Signed: Arvil Persons 08/10/2014, 6:51 PM

## 2014-08-10 NOTE — Plan of Care (Signed)
Problem: Consults Goal: General Surgical Patient Education (See Patient Education module for education specifics) Outcome: Completed/Met Date Met:  08/10/14  Problem: Phase I Progression Outcomes Goal: Pain controlled with appropriate interventions Outcome: Completed/Met Date Met:  08/10/14 Goal: OOB as tolerated unless otherwise ordered Outcome: Completed/Met Date Met:  08/10/14 Goal: Incision/dressings dry and intact Outcome: Not Applicable Date Met:  45/03/88 Goal: Sutures/staples intact Outcome: Not Applicable Date Met:  82/80/03 Goal: Tubes/drains patent Outcome: Not Applicable Date Met:  49/17/91 Goal: Voiding-avoid urinary catheter unless indicated Outcome: Completed/Met Date Met:  08/10/14 Goal: Vital signs/hemodynamically stable Outcome: Completed/Met Date Met:  08/10/14

## 2014-08-18 ENCOUNTER — Other Ambulatory Visit: Payer: Self-pay | Admitting: Urology

## 2014-09-09 ENCOUNTER — Encounter (HOSPITAL_BASED_OUTPATIENT_CLINIC_OR_DEPARTMENT_OTHER): Payer: Self-pay | Admitting: *Deleted

## 2014-09-10 ENCOUNTER — Encounter (HOSPITAL_BASED_OUTPATIENT_CLINIC_OR_DEPARTMENT_OTHER): Payer: Self-pay | Admitting: *Deleted

## 2014-09-11 ENCOUNTER — Encounter (HOSPITAL_BASED_OUTPATIENT_CLINIC_OR_DEPARTMENT_OTHER): Payer: Self-pay | Admitting: *Deleted

## 2014-09-11 NOTE — Progress Notes (Signed)
NPO AFTER MN. ARRIVE AT [id number]. NEEDS ISTAT . CURRENT EKG IN CHART AND EPIC. WILL TAKE ATENOLOL AM DOS W/ SIPS OF WATER AND IF NEEDED TAKE PAIN/ NAUSEA RX.

## 2014-09-14 NOTE — Anesthesia Preprocedure Evaluation (Addendum)
Anesthesia Evaluation  Patient identified by MRN, date of birth, ID band Patient awake    Reviewed: Allergy & Precautions, H&P , NPO status , Patient's Chart, lab work & pertinent test results, reviewed documented beta blocker date and time   Airway Mallampati: II  TM Distance: >3 FB Neck ROM: full    Dental  (+) Missing, Dental Advisory Given, Edentulous Upper Lower partial:   Pulmonary neg pulmonary ROS,  breath sounds clear to auscultation  Pulmonary exam normal       Cardiovascular Exercise Tolerance: Good + dysrhythmias Atrial Fibrillation Rhythm:regular Rate:Normal  ECG NSR   Neuro/Psych negative neurological ROS  negative psych ROS   GI/Hepatic negative GI ROS, Neg liver ROS,   Endo/Other  negative endocrine ROS  Renal/GU negative Renal ROS  negative genitourinary   Musculoskeletal   Abdominal   Peds  Hematology negative hematology ROS (+)   Anesthesia Other Findings   Reproductive/Obstetrics negative OB ROS                            Anesthesia Physical Anesthesia Plan  ASA: III  Anesthesia Plan: General   Post-op Pain Management:    Induction: Intravenous  Airway Management Planned: LMA  Additional Equipment:   Intra-op Plan:   Post-operative Plan:   Informed Consent:   Plan Discussed with: Surgeon  Anesthesia Plan Comments:         Anesthesia Quick Evaluation

## 2014-09-15 ENCOUNTER — Encounter (HOSPITAL_BASED_OUTPATIENT_CLINIC_OR_DEPARTMENT_OTHER): Payer: Self-pay | Admitting: *Deleted

## 2014-09-15 ENCOUNTER — Ambulatory Visit (HOSPITAL_BASED_OUTPATIENT_CLINIC_OR_DEPARTMENT_OTHER)
Admission: RE | Admit: 2014-09-15 | Discharge: 2014-09-15 | Disposition: A | Discharge status: Home / Self Care | Payer: Medicare HMO | Source: Ambulatory Visit | Attending: Urology | Admitting: Urology

## 2014-09-15 ENCOUNTER — Encounter (HOSPITAL_BASED_OUTPATIENT_CLINIC_OR_DEPARTMENT_OTHER)
Admission: RE | Disposition: A | Discharge status: Home / Self Care | Payer: Self-pay | Source: Ambulatory Visit | Attending: Urology

## 2014-09-15 ENCOUNTER — Ambulatory Visit (HOSPITAL_BASED_OUTPATIENT_CLINIC_OR_DEPARTMENT_OTHER): Payer: Medicare HMO | Admitting: Anesthesiology

## 2014-09-15 DIAGNOSIS — Z8052 Family history of malignant neoplasm of bladder: Secondary | ICD-10-CM | POA: Insufficient documentation

## 2014-09-15 DIAGNOSIS — R3915 Urgency of urination: Secondary | ICD-10-CM | POA: Diagnosis not present

## 2014-09-15 DIAGNOSIS — Z801 Family history of malignant neoplasm of trachea, bronchus and lung: Secondary | ICD-10-CM | POA: Diagnosis not present

## 2014-09-15 DIAGNOSIS — R350 Frequency of micturition: Secondary | ICD-10-CM | POA: Diagnosis not present

## 2014-09-15 DIAGNOSIS — R911 Solitary pulmonary nodule: Secondary | ICD-10-CM | POA: Insufficient documentation

## 2014-09-15 DIAGNOSIS — Z807 Family history of other malignant neoplasms of lymphoid, hematopoietic and related tissues: Secondary | ICD-10-CM | POA: Diagnosis not present

## 2014-09-15 DIAGNOSIS — I4891 Unspecified atrial fibrillation: Secondary | ICD-10-CM | POA: Diagnosis not present

## 2014-09-15 DIAGNOSIS — I491 Atrial premature depolarization: Secondary | ICD-10-CM | POA: Diagnosis not present

## 2014-09-15 DIAGNOSIS — N202 Calculus of kidney with calculus of ureter: Secondary | ICD-10-CM | POA: Insufficient documentation

## 2014-09-15 HISTORY — PX: HOLMIUM LASER APPLICATION: SHX5852

## 2014-09-15 HISTORY — DX: Allergic rhinitis, unspecified: J30.9

## 2014-09-15 HISTORY — DX: Presence of dental prosthetic device (complete) (partial): Z97.2

## 2014-09-15 HISTORY — PX: CYSTOSCOPY WITH STENT PLACEMENT: SHX5790

## 2014-09-15 HISTORY — DX: Personal history of urinary calculi: Z87.442

## 2014-09-15 HISTORY — DX: Palpitations: R00.2

## 2014-09-15 HISTORY — DX: Personal history of other infectious and parasitic diseases: Z86.19

## 2014-09-15 HISTORY — DX: Atrial premature depolarization: I49.1

## 2014-09-15 HISTORY — DX: Urgency of urination: R39.15

## 2014-09-15 HISTORY — DX: Calculus of kidney: N20.0

## 2014-09-15 HISTORY — DX: Calculus of ureter: N20.1

## 2014-09-15 HISTORY — PX: URETEROSCOPY: SHX842

## 2014-09-15 HISTORY — DX: Frequency of micturition: R35.0

## 2014-09-15 HISTORY — DX: Personal history of other diseases of the circulatory system: Z86.79

## 2014-09-15 HISTORY — DX: Solitary pulmonary nodule: R91.1

## 2014-09-15 LAB — POCT I-STAT 4, (NA,K, GLUC, HGB,HCT)
Glucose, Bld: 116 mg/dL — ABNORMAL HIGH (ref 70–99)
HEMATOCRIT: 40 % (ref 36.0–46.0)
HEMOGLOBIN: 13.6 g/dL (ref 12.0–15.0)
Potassium: 3.5 mEq/L — ABNORMAL LOW (ref 3.7–5.3)
Sodium: 137 mEq/L (ref 137–147)

## 2014-09-15 SURGERY — URETEROSCOPY
Anesthesia: General | Site: Ureter | Laterality: Left

## 2014-09-15 MED ORDER — ONDANSETRON HCL 4 MG/2ML IJ SOLN
INTRAMUSCULAR | Status: DC | PRN
  Administered 2014-09-15: 4 mg via INTRAVENOUS

## 2014-09-15 MED ORDER — SODIUM CHLORIDE 0.9 % IR SOLN
Status: DC | PRN
  Administered 2014-09-15: 6000 mL

## 2014-09-15 MED ORDER — PROPOFOL 10 MG/ML IV BOLUS
INTRAVENOUS | Status: DC | PRN
  Administered 2014-09-15: 150 mg via INTRAVENOUS

## 2014-09-15 MED ORDER — LIDOCAINE HCL (CARDIAC) 20 MG/ML IV SOLN
INTRAVENOUS | Status: DC | PRN
  Administered 2014-09-15: 50 mg via INTRAVENOUS

## 2014-09-15 MED ORDER — LACTATED RINGERS IV SOLN
INTRAVENOUS | Status: DC
  Filled 2014-09-15: qty 1000

## 2014-09-15 MED ORDER — FENTANYL CITRATE 0.05 MG/ML IJ SOLN
INTRAMUSCULAR | Status: DC | PRN
  Administered 2014-09-15 (×4): 25 ug via INTRAVENOUS

## 2014-09-15 MED ORDER — STERILE WATER FOR IRRIGATION IR SOLN
Status: DC | PRN
  Administered 2014-09-15: 500 mL

## 2014-09-15 MED ORDER — IOHEXOL 350 MG/ML SOLN
INTRAVENOUS | Status: DC | PRN
  Administered 2014-09-15: 10 mL

## 2014-09-15 MED ORDER — ACETAMINOPHEN 10 MG/ML IV SOLN
INTRAVENOUS | Status: DC | PRN
  Administered 2014-09-15: 1000 mg via INTRAVENOUS

## 2014-09-15 MED ORDER — FENTANYL CITRATE 0.05 MG/ML IJ SOLN
INTRAMUSCULAR | Status: AC
  Filled 2014-09-15: qty 4

## 2014-09-15 MED ORDER — BELLADONNA ALKALOIDS-OPIUM 16.2-60 MG RE SUPP
RECTAL | Status: AC
  Filled 2014-09-15: qty 1

## 2014-09-15 MED ORDER — KETOROLAC TROMETHAMINE 30 MG/ML IJ SOLN
INTRAMUSCULAR | Status: DC | PRN
  Administered 2014-09-15: 15 mg via INTRAVENOUS

## 2014-09-15 MED ORDER — SULFAMETHOXAZOLE-TRIMETHOPRIM 800-160 MG PO TABS: 1.0000 | ORAL_TABLET | Freq: Every day | ORAL | Status: DC

## 2014-09-15 MED ORDER — CEFAZOLIN SODIUM-DEXTROSE 2-3 GM-% IV SOLR
2.0000 g | INTRAVENOUS | Status: AC
  Administered 2014-09-15: 2 g via INTRAVENOUS
  Filled 2014-09-15: qty 50

## 2014-09-15 MED ORDER — LACTATED RINGERS IV SOLN
INTRAVENOUS | Status: DC
  Administered 2014-09-15: 07:00:00 via INTRAVENOUS
  Filled 2014-09-15: qty 1000

## 2014-09-15 MED ORDER — OXYCODONE-ACETAMINOPHEN 5-325 MG PO TABS: 1.0000 | ORAL_TABLET | Freq: Four times a day (QID) | ORAL | Status: DC | PRN

## 2014-09-15 MED ORDER — CEFAZOLIN SODIUM-DEXTROSE 2-3 GM-% IV SOLR
INTRAVENOUS | Status: AC
  Filled 2014-09-15: qty 50

## 2014-09-15 MED ORDER — CEFAZOLIN SODIUM 1-5 GM-% IV SOLN
1.0000 g | INTRAVENOUS | Status: DC
  Filled 2014-09-15: qty 50

## 2014-09-15 MED ORDER — DEXAMETHASONE SODIUM PHOSPHATE 4 MG/ML IJ SOLN
INTRAMUSCULAR | Status: DC | PRN
  Administered 2014-09-15: 10 mg via INTRAVENOUS

## 2014-09-15 MED ORDER — FENTANYL CITRATE 0.05 MG/ML IJ SOLN
25.0000 ug | INTRAMUSCULAR | Status: DC | PRN
  Filled 2014-09-15: qty 1

## 2014-09-15 SURGICAL SUPPLY — 45 items
ADAPTER CATH URET PLST 4-6FR (CATHETERS) IMPLANT
ADPR CATH URET STRL DISP 4-6FR (CATHETERS)
BAG DRAIN URO-CYSTO SKYTR STRL (DRAIN) ×3 IMPLANT
BAG DRN UROCATH (DRAIN) ×1
BASKET LASER NITINOL 1.9FR (BASKET) IMPLANT
BASKET STNLS GEMINI 4WIRE 3FR (BASKET) IMPLANT
BASKET ZERO TIP NITINOL 2.4FR (BASKET) IMPLANT
BSKT STON RTRVL 120 1.9FR (BASKET)
BSKT STON RTRVL GEM 120X11 3FR (BASKET)
BSKT STON RTRVL ZERO TP 2.4FR (BASKET)
CANISTER SUCT LVC 12 LTR MEDI- (MISCELLANEOUS) ×2 IMPLANT
CATH INTERMIT  6FR 70CM (CATHETERS) IMPLANT
CATH URET 5FR 28IN CONE TIP (BALLOONS)
CATH URET 5FR 28IN OPEN ENDED (CATHETERS) IMPLANT
CATH URET 5FR 70CM CONE TIP (BALLOONS) IMPLANT
CATH URET DUAL LUMEN 6-10FR 50 (CATHETERS) IMPLANT
CLOTH BEACON ORANGE TIMEOUT ST (SAFETY) ×3 IMPLANT
DRAPE CAMERA CLOSED 9X96 (DRAPES) ×3 IMPLANT
ELECT REM PT RETURN 9FT ADLT (ELECTROSURGICAL)
ELECTRODE REM PT RTRN 9FT ADLT (ELECTROSURGICAL) IMPLANT
FIBER LASER TRAC TIP (UROLOGICAL SUPPLIES) ×2 IMPLANT
GLOVE BIO SURGEON STRL SZ 6.5 (GLOVE) ×1 IMPLANT
GLOVE BIO SURGEON STRL SZ7.5 (GLOVE) ×3 IMPLANT
GLOVE BIO SURGEONS STRL SZ 6.5 (GLOVE) ×1
GLOVE INDICATOR 6.5 STRL GRN (GLOVE) ×2 IMPLANT
GOWN PREVENTION PLUS LG XLONG (DISPOSABLE) ×1 IMPLANT
GOWN STRL REIN XL XLG (GOWN DISPOSABLE) ×3 IMPLANT
GOWN STRL REUS W/ TWL LRG LVL3 (GOWN DISPOSABLE) IMPLANT
GOWN STRL REUS W/ TWL XL LVL3 (GOWN DISPOSABLE) IMPLANT
GOWN STRL REUS W/TWL LRG LVL3 (GOWN DISPOSABLE) ×3
GOWN STRL REUS W/TWL XL LVL3 (GOWN DISPOSABLE) ×3
GUIDEWIRE 0.038 PTFE COATED (WIRE) IMPLANT
GUIDEWIRE ANG ZIPWIRE 038X150 (WIRE) IMPLANT
GUIDEWIRE STR DUAL SENSOR (WIRE) ×5 IMPLANT
IV NS IRRIG 3000ML ARTHROMATIC (IV SOLUTION) ×6 IMPLANT
KIT BALLIN UROMAX 15FX10 (LABEL) IMPLANT
KIT BALLN UROMAX 15FX4 (MISCELLANEOUS) IMPLANT
KIT BALLN UROMAX 26 75X4 (MISCELLANEOUS)
PACK CYSTO (CUSTOM PROCEDURE TRAY) ×3 IMPLANT
SET HIGH PRES BAL DIL (LABEL)
SHEATH ACCESS URETERAL 38CM (SHEATH) IMPLANT
SHEATH URET ACCESS 12FR/35CM (UROLOGICAL SUPPLIES) IMPLANT
SHEATH URET ACCESS 12FR/55CM (UROLOGICAL SUPPLIES) IMPLANT
STENT CONTOUR 7FRX26 (STENTS) ×2 IMPLANT
SYRINGE IRR TOOMEY STRL 70CC (SYRINGE) IMPLANT

## 2014-09-15 NOTE — Discharge Instructions (Signed)
Ureteral Stent Implantation, Care After Refer to this sheet in the next few weeks. These instructions provide you with information on caring for yourself after your procedure. Your health care provider may also give you more specific instructions. Your treatment has been planned according to current medical practices, but problems sometimes occur. Call your health care provider if you have any problems or questions after your procedure. WHAT TO EXPECT AFTER THE PROCEDURE You should be back to normal activity within 48 hours after the procedure. Nausea and vomiting may occur and are commonly the result of anesthesia. It is common to experience sharp pain in the back or lower abdomen and penis with voiding. This is caused by movement of the ends of the stent with the act of urinating.It usually goes away within minutes after you have stopped urinating. HOME CARE INSTRUCTIONS Make sure to drink plenty of fluids. You may have small amounts of bleeding, causing your urine to be red. This is normal. Certain movements may trigger pain or a feeling that you need to urinate. You may be given medicines to prevent infection or bladder spasms. Be sure to take all medicines as directed. Only take over-the-counter or prescription medicines for pain, discomfort, or fever as directed by your health care provider. Do not take aspirin, as this can make bleeding worse.  REMOVAL OF THE STENT: Remove the stent Friday morning, Sep 18, 2014 by pulling the string with slow steady pressure.    Be sure to keep all follow-up appointments so your health care provider can check that you are healing properly in 6 weeks.   SEEK MEDICAL CARE IF:  You experience increasing pain.  Your pain medicine is not working. SEEK IMMEDIATE MEDICAL CARE IF:  Your urine is dark red or has blood clots.  You are leaking urine (incontinent).  You have a fever, chills, feeling sick to your stomach (nausea), or vomiting.  Your pain is not  relieved by pain medicine.  The end of the stent comes out of the urethra.  You are unable to urinate.   Post Anesthesia Home Care Instructions  Activity: Get plenty of rest for the remainder of the day. A responsible adult should stay with you for 24 hours following the procedure.  For the next 24 hours, DO NOT: -Drive a car -Paediatric nurse -Drink alcoholic beverages -Take any medication unless instructed by your physician -Make any legal decisions or sign important papers.  Meals: Start with liquid foods such as gelatin or soup. Progress to regular foods as tolerated. Avoid greasy, spicy, heavy foods. If nausea and/or vomiting occur, drink only clear liquids until the nausea and/or vomiting subsides. Call your physician if vomiting continues.  Special Instructions/Symptoms: Your throat may feel dry or sore from the anesthesia or the breathing tube placed in your throat during surgery. If this causes discomfort, gargle with warm salt water. The discomfort should disappear within 24 hours.

## 2014-09-15 NOTE — Transfer of Care (Signed)
Immediate Anesthesia Transfer of Care Note  Patient: Tanya Mueller  Procedure(s) Performed: Procedure(s): URETEROSCOPY (Left) HOLMIUM LASER APPLICATION (Left) CYSTOSCOPY WITH STENT PLACEMENT (Left)  Patient Location: PACU  Anesthesia Type:General  Level of Consciousness: awake and oriented  Airway & Oxygen Therapy: Patient Spontanous Breathing and Patient connected to nasal cannula oxygen  Post-op Assessment: Report given to PACU RN  Post vital signs: Reviewed and stable  Complications: No apparent anesthesia complications

## 2014-09-15 NOTE — Anesthesia Procedure Notes (Signed)
Procedure Name: LMA Insertion Date/Time: 09/15/2014 8:22 AM Performed by: Bethena Roys T Pre-anesthesia Checklist: Patient identified, Emergency Drugs available, Suction available and Patient being monitored Patient Re-evaluated:Patient Re-evaluated prior to inductionOxygen Delivery Method: Circle System Utilized Preoxygenation: Pre-oxygenation with 100% oxygen Intubation Type: IV induction Ventilation: Mask ventilation without difficulty LMA: LMA inserted LMA Size: 4.0 Number of attempts: 1 Airway Equipment and Method: bite block Placement Confirmation: positive ETCO2 Tube secured with: Tape Dental Injury: Teeth and Oropharynx as per pre-operative assessment

## 2014-09-15 NOTE — Anesthesia Postprocedure Evaluation (Signed)
  Anesthesia Post-op Note  Patient: Tanya Mueller  Procedure(s) Performed: Procedure(s) (LRB): URETEROSCOPY (Left) HOLMIUM LASER APPLICATION (Left) CYSTOSCOPY WITH STENT PLACEMENT (Left)  Patient Location: PACU  Anesthesia Type: General  Level of Consciousness: awake and alert   Airway and Oxygen Therapy: Patient Spontanous Breathing  Post-op Pain: mild  Post-op Assessment: Post-op Vital signs reviewed, Patient's Cardiovascular Status Stable, Respiratory Function Stable, Patent Airway and No signs of Nausea or vomiting  Last Vitals:  Filed Vitals:   09/15/14 0955  BP:   Pulse: 59  Temp:   Resp: 15    Post-op Vital Signs: stable   Complications: No apparent anesthesia complications

## 2014-09-15 NOTE — H&P (Addendum)
H&P  Chief Complaint: Left proximal ureter and left renal stones  History of Present Illness: Patient is a 79 year old female who presented about a month ago with sepsis and obstructing left proximal ureteral stone here and she underwent urgent stenting. She recovered well. She presents today for definitive stone management of the left proximal ureteral stone and she also has significant left renal stones.  She presented earlier in the month and the urine culture grew Escherichia coli resistant to Cipro. She was experiencing fever, frequency and foul urine odor. She was treated with Bactrim and completed this course a few days ago. She reports no recurrence of any of these symptoms. She feels well today. She has had minimal stent discomfort over the past few days and no dysuria. She is afebrile today.    Past Medical History  Diagnosis Date  . History of atrial fibrillation     episode 2013 converted with cardizem-- happened at time acute illness due to kidney stone --  resolved  no issues since  . Left ureteral calculus   . Left nephrolithiasis   . History of kidney stones   . Solitary lung nodule     benign right middle lobe per CT  . PAC (premature atrial contraction)   . Intermittent palpitations     controlled w/ atenolol  . History of sepsis     urosepsis  2013 due to kidney stone  . Allergic rhinitis   . Frequency of urination   . Urgency of urination   . Wears dentures     upper  . Wears partial dentures     lower   Past Surgical History  Procedure Laterality Date  . Cystoscopy/retrograde/ureteroscopy Left 08/09/2014    Procedure: CYSTOSCOPY/RETROGRADE/LEFT STENT;  Surgeon: Bernestine Amass, MD;  Location: [facility];  Service: Urology;  Laterality: Left;  . Transthoracic echocardiogram  02-13-2012    mild LVH/  ef 65%/  trivial MR  &  PR/  mild RVE  . Extracorporeal shock wave lithotripsy  2013  . Cataract extraction w/ intraocular lens  implant, bilateral  2010  . Total  abdominal hysterectomy  age 53    w/ unilateral salpingoophorectomy  . Laparotomy w/  unilateral salpingoophorectomy  age 6    Home Medications:  Prescriptions prior to admission  Medication Sig Dispense Refill Last Dose  . acetaminophen-codeine (TYLENOL #3) 300-30 MG per tablet Take 1 tablet by mouth 4 (four) times daily as needed (pain).   09/14/2014 at Unknown time  . atenolol (TENORMIN) 50 MG tablet Take 50 mg by mouth 2 (two) times daily.    09/15/2014 at 0500  . cholecalciferol (VITAMIN D) 1000 UNITS tablet Take 1,000 Units by mouth daily.   Past Month at Unknown time  . fluticasone (FLONASE) 50 MCG/ACT nasal spray Place 2 sprays into the nose daily as needed for allergies.    Past Week at Unknown time  . hydrochlorothiazide (HYDRODIURIL) 25 MG tablet Take 25 mg by mouth daily.   09/14/2014 at Unknown time  . Multiple Vitamins-Minerals (MULTIVITAMIN WITH MINERALS) tablet Take 1 tablet by mouth daily.   Past Week at Unknown time  . potassium chloride (K-DUR) 10 MEQ tablet Take 1 tablet (10 mEq total) by mouth daily. 30 tablet 5 09/14/2014 at Unknown time  . ondansetron (ZOFRAN-ODT) 8 MG disintegrating tablet Take 8 mg by mouth every 6 (six) hours as needed for nausea.    Unknown at Unknown time  . Psyllium (METAMUCIL PO) Take by mouth as needed.  More than a month at Unknown time   Allergies: No Known Allergies  Family History  Problem Relation Age of Onset  . Cancer      Lung and bladder  . Cancer      "Bone marrow cancer"   Social History:  reports that she has never smoked. She has never used smokeless tobacco. She reports that she does not drink alcohol or use illicit drugs.  ROS: A complete review of systems was performed.  All systems are negative except for pertinent findings as noted. ROS   Physical Exam:  Vital signs in last 24 hours: Temp:  [98.6 F (37 C)] 98.6 F (37 C) (12/15 0648) Pulse Rate:  [66] 66 (12/15 0648) Resp:  [16] 16 (12/15 0648) BP:  (156)/(67) 156/67 mmHg (12/15 0648) SpO2:  [99 %] 99 % (12/15 0648) Weight:  [71.668 kg (158 lb)] 71.668 kg (158 lb) (12/15 7588) General:  Alert and oriented, No acute distress HEENT: Normocephalic, atraumatic Neck: No JVD or lymphadenopathy Cardiovascular: Regular rate and rhythm Lungs: Regular rate and effort Abdomen: Soft, nontender, nondistended, no abdominal masses Back: No CVA tenderness Extremities: No edema Neurologic: Grossly intact  Laboratory Data:  Results for orders placed or performed during the hospital encounter of 09/15/14 (from the past 24 hour(s))  I-STAT 4, (NA,K, GLUC, HGB,HCT)     Status: Abnormal   Collection Time: 09/15/14  7:28 AM  Result Value Ref Range   Sodium 137 137 - 147 mEq/L   Potassium 3.5 (L) 3.7 - 5.3 mEq/L   Glucose, Bld 116 (H) 70 - 99 mg/dL   HCT 40.0 36.0 - 46.0 %   Hemoglobin 13.6 12.0 - 15.0 g/dL   No results found for this or any previous visit (from the past 240 hour(s)). Creatinine: No results for input(s): CREATININE in the last 168 hours.  Impression/Assessment/plan: I discussed with the patient, her husband and daughter the nature, potential benefits, risks and alternatives to cystoscopy with left ureteroscopy holmium laser lithotripsy stone basket extraction and left ureteral stent exchange, including side effects of the proposed treatment, the likelihood of the patient achieving the goals of the procedure, and any potential problems that might occur during the procedure or recuperation. We discussed she may need a staged procedure. We discussed our goal would be to render the left ureter and kidney stone free now the chance access. We discussed alternatives such as simply removing the stent or shockwave lithotripsy. All questions answered. Patient elects to proceed.  Festus Aloe 09/15/2014, 8:15 AM    I should add I discussed the patient with Dr. Janice Norrie and also reviewed her labs, notes and November 2015 CT images.

## 2014-09-16 ENCOUNTER — Encounter (HOSPITAL_BASED_OUTPATIENT_CLINIC_OR_DEPARTMENT_OTHER): Payer: Self-pay | Admitting: Urology

## 2014-09-16 NOTE — Op Note (Signed)
Preoperative diagnosis: Left ureteral stone, left nephrolithiasis Postoperative diagnosis: Left nephrolithiasis  Procedure: Cystoscopy Left ureteroscopy with holmium laser lithotripsy, stone basket extraction Left ureteral stent exchange  Surgeon: Gracelin Weisberg  Type of anesthesia: General  Indications for procedure: Patient is a 79 year old female who was stented urgently for a left proximal ureteral stone, UTI and sepsis.  She presents for definitive stone management.  On CT stone was in the left proximal ureter and there were some left lower pole calcifications.  Findings: Cystoscopy was unremarkable.  On left ureteroscopy the stone had been pushed into the renal pelvis likely at stent placement.  The lower pole stones were submucosal and not accessible.  Description of procedure: After consent was obtained patient brought to the operating room.  After adequate anesthesia she was placed in lithotomy position and prepped and draped in the usual sterile fashion.  A timeout was performed to confirm the patient and procedure.  The cystoscope was passed per urethra and the left ureteral stent grasped and removed to the urethral meatus.  A sensor wire was advanced and coiled in the collecting system.  A dual lumen exchange catheter was passed without difficulty.  A second wire was placed.  Over the second wire a ureteral access sheath was advanced without difficulty.  The digital ureteroscope was passed and the stone was found in the renal pelvis.  It was grasped with an end gauge basket and placed in the upper pole for more direct access for lithotripsy.  It was broken into small fragments with the holmium laser at a setting of 0.8 and 8.  The fragments were sequentially removed with the engage basket without difficulty.  Final inspection of the collecting system noted there to be no other stones or significant stone fragments.  The renal pelvis was normal.  The proximal ureter was normal.  The scope was  backed out to the access sheath and the 2 were removed together inspecting the ureter.  The ureter was noted to be without injury and without stone fragment or stone.  The remaining wire was backloaded on the cystoscope and a 6 x 26 cm stent was advanced.  The wire was removed with a good coil reconstituting in the kidney and a good coil in the bladder.  The string was left on the stent.  Patient was awakened taken to the recovery room in stable condition.  Complications: None Blood loss: Minimal  Specimens: Stone fragments given to patient  Drains: 6 x 26 cm left ureteral stent with string

## 2015-11-03 DIAGNOSIS — N200 Calculus of kidney: Secondary | ICD-10-CM | POA: Diagnosis not present

## 2015-11-03 DIAGNOSIS — R8271 Bacteriuria: Secondary | ICD-10-CM | POA: Diagnosis not present

## 2015-11-03 DIAGNOSIS — Z0000 Encounter for general adult medical examination without abnormal findings: Secondary | ICD-10-CM | POA: Diagnosis not present

## 2015-11-19 DIAGNOSIS — N183 Chronic kidney disease, stage 3 (moderate): Secondary | ICD-10-CM | POA: Diagnosis not present

## 2015-11-19 DIAGNOSIS — E7800 Pure hypercholesterolemia, unspecified: Secondary | ICD-10-CM | POA: Diagnosis not present

## 2015-11-19 DIAGNOSIS — I10 Essential (primary) hypertension: Secondary | ICD-10-CM | POA: Diagnosis not present

## 2015-11-23 DIAGNOSIS — M25552 Pain in left hip: Secondary | ICD-10-CM | POA: Diagnosis not present

## 2015-11-23 DIAGNOSIS — I10 Essential (primary) hypertension: Secondary | ICD-10-CM | POA: Diagnosis not present

## 2015-11-23 DIAGNOSIS — I480 Paroxysmal atrial fibrillation: Secondary | ICD-10-CM | POA: Diagnosis not present

## 2015-12-16 DIAGNOSIS — M5431 Sciatica, right side: Secondary | ICD-10-CM | POA: Diagnosis not present

## 2015-12-24 DIAGNOSIS — M25551 Pain in right hip: Secondary | ICD-10-CM | POA: Diagnosis not present

## 2016-01-05 DIAGNOSIS — Z0000 Encounter for general adult medical examination without abnormal findings: Secondary | ICD-10-CM | POA: Diagnosis not present

## 2016-01-05 DIAGNOSIS — R3915 Urgency of urination: Secondary | ICD-10-CM | POA: Diagnosis not present

## 2016-01-12 DIAGNOSIS — I700 Atherosclerosis of aorta: Secondary | ICD-10-CM | POA: Diagnosis not present

## 2016-01-12 DIAGNOSIS — M545 Low back pain: Secondary | ICD-10-CM | POA: Diagnosis not present

## 2016-01-12 DIAGNOSIS — M47816 Spondylosis without myelopathy or radiculopathy, lumbar region: Secondary | ICD-10-CM | POA: Diagnosis not present

## 2016-01-12 DIAGNOSIS — M47896 Other spondylosis, lumbar region: Secondary | ICD-10-CM | POA: Diagnosis not present

## 2016-01-12 DIAGNOSIS — M5431 Sciatica, right side: Secondary | ICD-10-CM | POA: Diagnosis not present

## 2016-01-12 DIAGNOSIS — M4186 Other forms of scoliosis, lumbar region: Secondary | ICD-10-CM | POA: Diagnosis not present

## 2016-01-12 DIAGNOSIS — N200 Calculus of kidney: Secondary | ICD-10-CM | POA: Diagnosis not present

## 2016-01-12 DIAGNOSIS — M5432 Sciatica, left side: Secondary | ICD-10-CM | POA: Diagnosis not present

## 2016-01-27 DIAGNOSIS — Y939 Activity, unspecified: Secondary | ICD-10-CM | POA: Diagnosis not present

## 2016-01-27 DIAGNOSIS — M25551 Pain in right hip: Secondary | ICD-10-CM | POA: Diagnosis not present

## 2016-01-27 DIAGNOSIS — M47816 Spondylosis without myelopathy or radiculopathy, lumbar region: Secondary | ICD-10-CM | POA: Diagnosis not present

## 2016-01-27 DIAGNOSIS — M10051 Idiopathic gout, right hip: Secondary | ICD-10-CM | POA: Diagnosis not present

## 2016-01-27 DIAGNOSIS — M5136 Other intervertebral disc degeneration, lumbar region: Secondary | ICD-10-CM | POA: Diagnosis not present

## 2016-01-27 DIAGNOSIS — M7071 Other bursitis of hip, right hip: Secondary | ICD-10-CM | POA: Diagnosis not present

## 2016-01-27 DIAGNOSIS — Z79899 Other long term (current) drug therapy: Secondary | ICD-10-CM | POA: Diagnosis not present

## 2016-01-28 DIAGNOSIS — M419 Scoliosis, unspecified: Secondary | ICD-10-CM | POA: Diagnosis not present

## 2016-01-28 DIAGNOSIS — M549 Dorsalgia, unspecified: Secondary | ICD-10-CM | POA: Diagnosis not present

## 2016-01-28 DIAGNOSIS — Z6826 Body mass index (BMI) 26.0-26.9, adult: Secondary | ICD-10-CM | POA: Diagnosis not present

## 2016-01-28 DIAGNOSIS — M4726 Other spondylosis with radiculopathy, lumbar region: Secondary | ICD-10-CM | POA: Diagnosis not present

## 2016-01-28 DIAGNOSIS — M4806 Spinal stenosis, lumbar region: Secondary | ICD-10-CM | POA: Diagnosis not present

## 2016-01-31 DIAGNOSIS — M79604 Pain in right leg: Secondary | ICD-10-CM | POA: Diagnosis not present

## 2016-01-31 DIAGNOSIS — M1611 Unilateral primary osteoarthritis, right hip: Secondary | ICD-10-CM | POA: Diagnosis not present

## 2016-01-31 DIAGNOSIS — R600 Localized edema: Secondary | ICD-10-CM | POA: Diagnosis not present

## 2016-01-31 DIAGNOSIS — Z79899 Other long term (current) drug therapy: Secondary | ICD-10-CM | POA: Diagnosis not present

## 2016-02-01 DIAGNOSIS — N3090 Cystitis, unspecified without hematuria: Secondary | ICD-10-CM | POA: Diagnosis not present

## 2016-02-01 DIAGNOSIS — J189 Pneumonia, unspecified organism: Secondary | ICD-10-CM | POA: Diagnosis not present

## 2016-02-01 DIAGNOSIS — I10 Essential (primary) hypertension: Secondary | ICD-10-CM | POA: Diagnosis not present

## 2016-02-01 DIAGNOSIS — R51 Headache: Secondary | ICD-10-CM | POA: Diagnosis not present

## 2016-02-02 DIAGNOSIS — B9620 Unspecified Escherichia coli [E. coli] as the cause of diseases classified elsewhere: Secondary | ICD-10-CM | POA: Diagnosis not present

## 2016-02-02 DIAGNOSIS — N390 Urinary tract infection, site not specified: Secondary | ICD-10-CM | POA: Diagnosis not present

## 2016-02-02 DIAGNOSIS — I10 Essential (primary) hypertension: Secondary | ICD-10-CM | POA: Diagnosis not present

## 2016-02-02 DIAGNOSIS — E871 Hypo-osmolality and hyponatremia: Secondary | ICD-10-CM | POA: Diagnosis not present

## 2016-02-02 DIAGNOSIS — M25551 Pain in right hip: Secondary | ICD-10-CM | POA: Diagnosis not present

## 2016-02-02 DIAGNOSIS — N200 Calculus of kidney: Secondary | ICD-10-CM | POA: Diagnosis not present

## 2016-02-02 DIAGNOSIS — G894 Chronic pain syndrome: Secondary | ICD-10-CM | POA: Diagnosis not present

## 2016-02-02 DIAGNOSIS — I499 Cardiac arrhythmia, unspecified: Secondary | ICD-10-CM | POA: Diagnosis not present

## 2016-02-02 DIAGNOSIS — I159 Secondary hypertension, unspecified: Secondary | ICD-10-CM | POA: Diagnosis not present

## 2016-02-02 DIAGNOSIS — Z79899 Other long term (current) drug therapy: Secondary | ICD-10-CM | POA: Diagnosis not present

## 2016-02-03 DIAGNOSIS — E871 Hypo-osmolality and hyponatremia: Secondary | ICD-10-CM | POA: Diagnosis not present

## 2016-02-04 DIAGNOSIS — E871 Hypo-osmolality and hyponatremia: Secondary | ICD-10-CM | POA: Diagnosis not present

## 2016-02-09 DIAGNOSIS — M5126 Other intervertebral disc displacement, lumbar region: Secondary | ICD-10-CM | POA: Diagnosis not present

## 2016-02-09 DIAGNOSIS — M4806 Spinal stenosis, lumbar region: Secondary | ICD-10-CM | POA: Diagnosis not present

## 2016-02-09 DIAGNOSIS — M47816 Spondylosis without myelopathy or radiculopathy, lumbar region: Secondary | ICD-10-CM | POA: Diagnosis not present

## 2016-02-11 DIAGNOSIS — M5116 Intervertebral disc disorders with radiculopathy, lumbar region: Secondary | ICD-10-CM | POA: Diagnosis not present

## 2016-02-11 DIAGNOSIS — M4726 Other spondylosis with radiculopathy, lumbar region: Secondary | ICD-10-CM | POA: Diagnosis not present

## 2016-02-11 DIAGNOSIS — M419 Scoliosis, unspecified: Secondary | ICD-10-CM | POA: Diagnosis not present

## 2016-02-11 DIAGNOSIS — M4806 Spinal stenosis, lumbar region: Secondary | ICD-10-CM | POA: Diagnosis not present

## 2016-02-23 DIAGNOSIS — N3000 Acute cystitis without hematuria: Secondary | ICD-10-CM | POA: Diagnosis not present

## 2016-02-24 DIAGNOSIS — M4806 Spinal stenosis, lumbar region: Secondary | ICD-10-CM | POA: Diagnosis not present

## 2016-02-24 DIAGNOSIS — M419 Scoliosis, unspecified: Secondary | ICD-10-CM | POA: Diagnosis not present

## 2016-02-24 DIAGNOSIS — M5416 Radiculopathy, lumbar region: Secondary | ICD-10-CM | POA: Diagnosis not present

## 2016-03-01 DIAGNOSIS — M5416 Radiculopathy, lumbar region: Secondary | ICD-10-CM | POA: Diagnosis not present

## 2016-03-03 DIAGNOSIS — M5416 Radiculopathy, lumbar region: Secondary | ICD-10-CM | POA: Diagnosis not present

## 2016-03-03 DIAGNOSIS — M4806 Spinal stenosis, lumbar region: Secondary | ICD-10-CM | POA: Diagnosis not present

## 2016-03-03 DIAGNOSIS — M545 Low back pain: Secondary | ICD-10-CM | POA: Diagnosis not present

## 2016-03-03 DIAGNOSIS — I482 Chronic atrial fibrillation: Secondary | ICD-10-CM | POA: Diagnosis not present

## 2016-03-05 DIAGNOSIS — E871 Hypo-osmolality and hyponatremia: Secondary | ICD-10-CM | POA: Diagnosis not present

## 2016-03-05 DIAGNOSIS — M5431 Sciatica, right side: Secondary | ICD-10-CM | POA: Diagnosis not present

## 2016-03-22 DIAGNOSIS — M5416 Radiculopathy, lumbar region: Secondary | ICD-10-CM | POA: Diagnosis not present

## 2016-03-27 DIAGNOSIS — I482 Chronic atrial fibrillation: Secondary | ICD-10-CM | POA: Diagnosis not present

## 2016-03-27 DIAGNOSIS — R300 Dysuria: Secondary | ICD-10-CM | POA: Diagnosis not present

## 2016-03-27 DIAGNOSIS — M5416 Radiculopathy, lumbar region: Secondary | ICD-10-CM | POA: Diagnosis not present

## 2016-03-27 DIAGNOSIS — M545 Low back pain: Secondary | ICD-10-CM | POA: Diagnosis not present

## 2016-03-27 DIAGNOSIS — M4806 Spinal stenosis, lumbar region: Secondary | ICD-10-CM | POA: Diagnosis not present

## 2016-04-05 DIAGNOSIS — M419 Scoliosis, unspecified: Secondary | ICD-10-CM | POA: Diagnosis not present

## 2016-04-05 DIAGNOSIS — M4716 Other spondylosis with myelopathy, lumbar region: Secondary | ICD-10-CM | POA: Diagnosis not present

## 2016-04-17 DIAGNOSIS — M5126 Other intervertebral disc displacement, lumbar region: Secondary | ICD-10-CM | POA: Diagnosis not present

## 2016-04-17 DIAGNOSIS — M5136 Other intervertebral disc degeneration, lumbar region: Secondary | ICD-10-CM | POA: Diagnosis not present

## 2016-04-17 DIAGNOSIS — M4716 Other spondylosis with myelopathy, lumbar region: Secondary | ICD-10-CM | POA: Diagnosis not present

## 2016-04-17 DIAGNOSIS — M47814 Spondylosis without myelopathy or radiculopathy, thoracic region: Secondary | ICD-10-CM | POA: Diagnosis not present

## 2016-04-17 DIAGNOSIS — I700 Atherosclerosis of aorta: Secondary | ICD-10-CM | POA: Diagnosis not present

## 2016-04-17 DIAGNOSIS — M4807 Spinal stenosis, lumbosacral region: Secondary | ICD-10-CM | POA: Diagnosis not present

## 2016-04-17 DIAGNOSIS — M4125 Other idiopathic scoliosis, thoracolumbar region: Secondary | ICD-10-CM | POA: Diagnosis not present

## 2016-04-17 DIAGNOSIS — N200 Calculus of kidney: Secondary | ICD-10-CM | POA: Diagnosis not present

## 2016-04-17 DIAGNOSIS — M5135 Other intervertebral disc degeneration, thoracolumbar region: Secondary | ICD-10-CM | POA: Diagnosis not present

## 2016-04-17 DIAGNOSIS — M4806 Spinal stenosis, lumbar region: Secondary | ICD-10-CM | POA: Diagnosis not present

## 2016-04-19 DIAGNOSIS — Z6824 Body mass index (BMI) 24.0-24.9, adult: Secondary | ICD-10-CM | POA: Diagnosis not present

## 2016-04-19 DIAGNOSIS — R600 Localized edema: Secondary | ICD-10-CM | POA: Diagnosis not present

## 2016-04-19 DIAGNOSIS — N3000 Acute cystitis without hematuria: Secondary | ICD-10-CM | POA: Diagnosis not present

## 2016-05-02 DIAGNOSIS — M419 Scoliosis, unspecified: Secondary | ICD-10-CM | POA: Diagnosis not present

## 2016-05-02 DIAGNOSIS — M4716 Other spondylosis with myelopathy, lumbar region: Secondary | ICD-10-CM | POA: Diagnosis not present

## 2016-05-05 DIAGNOSIS — N3000 Acute cystitis without hematuria: Secondary | ICD-10-CM | POA: Diagnosis not present

## 2016-05-05 DIAGNOSIS — H81319 Aural vertigo, unspecified ear: Secondary | ICD-10-CM | POA: Diagnosis not present

## 2016-05-05 DIAGNOSIS — Z6824 Body mass index (BMI) 24.0-24.9, adult: Secondary | ICD-10-CM | POA: Diagnosis not present

## 2016-05-22 DIAGNOSIS — E7800 Pure hypercholesterolemia, unspecified: Secondary | ICD-10-CM | POA: Diagnosis not present

## 2016-05-22 DIAGNOSIS — I10 Essential (primary) hypertension: Secondary | ICD-10-CM | POA: Diagnosis not present

## 2016-05-22 DIAGNOSIS — N183 Chronic kidney disease, stage 3 (moderate): Secondary | ICD-10-CM | POA: Diagnosis not present

## 2016-05-24 DIAGNOSIS — M25552 Pain in left hip: Secondary | ICD-10-CM | POA: Diagnosis not present

## 2016-05-24 DIAGNOSIS — Z6825 Body mass index (BMI) 25.0-25.9, adult: Secondary | ICD-10-CM | POA: Diagnosis not present

## 2016-05-24 DIAGNOSIS — I480 Paroxysmal atrial fibrillation: Secondary | ICD-10-CM | POA: Diagnosis not present

## 2016-05-24 DIAGNOSIS — I10 Essential (primary) hypertension: Secondary | ICD-10-CM | POA: Diagnosis not present

## 2016-06-19 DIAGNOSIS — Z79891 Long term (current) use of opiate analgesic: Secondary | ICD-10-CM | POA: Diagnosis not present

## 2016-06-19 DIAGNOSIS — M4726 Other spondylosis with radiculopathy, lumbar region: Secondary | ICD-10-CM | POA: Diagnosis not present

## 2016-06-19 DIAGNOSIS — I10 Essential (primary) hypertension: Secondary | ICD-10-CM | POA: Diagnosis not present

## 2016-06-19 DIAGNOSIS — M4716 Other spondylosis with myelopathy, lumbar region: Secondary | ICD-10-CM | POA: Diagnosis not present

## 2016-06-19 DIAGNOSIS — M4316 Spondylolisthesis, lumbar region: Secondary | ICD-10-CM | POA: Diagnosis not present

## 2016-06-19 DIAGNOSIS — I499 Cardiac arrhythmia, unspecified: Secondary | ICD-10-CM | POA: Diagnosis not present

## 2016-06-19 DIAGNOSIS — M419 Scoliosis, unspecified: Secondary | ICD-10-CM | POA: Diagnosis not present

## 2016-06-19 DIAGNOSIS — Z79899 Other long term (current) drug therapy: Secondary | ICD-10-CM | POA: Diagnosis not present

## 2016-06-19 DIAGNOSIS — M47816 Spondylosis without myelopathy or radiculopathy, lumbar region: Secondary | ICD-10-CM | POA: Diagnosis not present

## 2016-06-19 DIAGNOSIS — M4186 Other forms of scoliosis, lumbar region: Secondary | ICD-10-CM | POA: Diagnosis not present

## 2016-06-19 DIAGNOSIS — M4326 Fusion of spine, lumbar region: Secondary | ICD-10-CM | POA: Diagnosis not present

## 2016-06-20 DIAGNOSIS — M4716 Other spondylosis with myelopathy, lumbar region: Secondary | ICD-10-CM | POA: Diagnosis not present

## 2016-06-21 DIAGNOSIS — M4716 Other spondylosis with myelopathy, lumbar region: Secondary | ICD-10-CM | POA: Diagnosis not present

## 2016-06-22 DIAGNOSIS — M47816 Spondylosis without myelopathy or radiculopathy, lumbar region: Secondary | ICD-10-CM | POA: Diagnosis not present

## 2016-06-23 DIAGNOSIS — M4326 Fusion of spine, lumbar region: Secondary | ICD-10-CM | POA: Diagnosis not present

## 2016-06-27 DIAGNOSIS — M47816 Spondylosis without myelopathy or radiculopathy, lumbar region: Secondary | ICD-10-CM | POA: Diagnosis not present

## 2016-06-27 DIAGNOSIS — Z981 Arthrodesis status: Secondary | ICD-10-CM | POA: Diagnosis not present

## 2016-06-27 DIAGNOSIS — M4716 Other spondylosis with myelopathy, lumbar region: Secondary | ICD-10-CM | POA: Diagnosis not present

## 2016-07-07 ENCOUNTER — Other Ambulatory Visit: Payer: Self-pay | Admitting: *Deleted

## 2016-07-07 ENCOUNTER — Encounter: Payer: Self-pay | Admitting: *Deleted

## 2016-07-07 NOTE — Telephone Encounter (Signed)
This encounter was created in error - please disregard.

## 2016-07-07 NOTE — Patient Outreach (Signed)
[facility]) Care Management  07/07/2016  Tanya Mueller 13-Jun-1935 [id number]  Referral from [name]:  Telephone call to patient. Person who answered call advised that patient was not available. Message left to have patient return call.  Plan: Will follow up.   [name], RN BSN [name] Management Coordinator [facility] Care Management  [phone]

## 2016-07-10 ENCOUNTER — Other Ambulatory Visit: Payer: Self-pay | Admitting: *Deleted

## 2016-07-10 NOTE — Patient Outreach (Signed)
[name]) Care Management  07/10/2016  Tanya Mueller 1935/03/08 [id number]  Telephone call to patient; who was advised of reason for call & of [facility] care management services.  HIPPA verification received from patient.  Patient voices that she was recently hospitalized for lumbar fusion at [facility].  Patient voices that she was discharged to home 06/25/2016. Voices that spouse & daughter help care for her. Currently no physical therapy or home services.  States she is using cane or walker to move around with. States she has fallen once since discharge from hospital.    Voices that she has set up appointment for follow up with back surgeon-Dr. Glenna Fellows on 07/25/2016.  Voices that she has not set up appointment with primary care doctor. Patient advised of the importance of setting up appointment for hospital follow up with primary care. Voices understanding & plans to call for appointment. States spouse will take her to appointments.   States getting prescriptions filled without problems and taking medications as prescribed consistently. States her pain is controlled with pain medication & only uses twice daily.   Patient consents to [facility] care management services & agrees to referral to [facility] care coordinator.   Plan: Refer to care management assistant to assign to community care coordinator for complex case management of patient with recent hospital discharge.    Tanya Daisy, RN BSN [place] Management Coordinator [facility] Care Management  [phone]

## 2016-07-11 NOTE — Patient Outreach (Signed)
Telephone call to pt for transition of care week 1, no answer to telephone, left voicemail requesting return phone call.  Jacqlyn Larsen [facility], [place] Coordinator [phone]

## 2016-07-12 ENCOUNTER — Encounter: Payer: Self-pay | Admitting: *Deleted

## 2016-07-12 ENCOUNTER — Other Ambulatory Visit: Payer: Self-pay | Admitting: *Deleted

## 2016-07-12 NOTE — Patient Outreach (Signed)
RN CM received referral from [place], pt discharged [facility] 06/25/16 after lumbar fusion.  RN CM spoke with pt, HIPAA verified, pt reports she lives with husband and a friend is staying with her while her husband is out.  Pt to follow up Dr. Carloyn Manner in 2 weeks and sees Dr. Quintin Alto q 3-4 months, RN CM encouraged pt to see primary care MD post hospital follow up, pt does not want to do this and does not feel it is necessary, pt still drives, pt states she has all medications and her husband assists with the medications.  RN CM attempted to reconcile medications and ask pt to read from labels, pt put her friend on the phone to assist with this and friend hung up the telephone.  RN CM called back and no answer to telephone and no option to leave voicemail, unable to reconcile medications over the phone. Pt agreeable to weekly transition of care calls but does not feel she needs a home visit citing she does not have a lot of needs.  Pt states her main goal is to heal up and "get over this surgery".  Pt states " I already feel so much better since I had this surgery"  [facility] CM Care Plan Problem One   Flowsheet Row Most Recent Value  Care Plan Problem One  Pt will heal without complications or infection (lumbar fusion)  Role Documenting the Problem One  Care Management [place] for Problem One  Active  [facility]N Long Term Goal (31-90 days)  pt will heal without complications or infection within 45 days.  THN Long Term Goal Start Date  07/12/16  Interventions for Problem One Long Term Goal  RN CM reviewed signs/ symptoms infection, reportable signs/ symptoms, reviewed no heavy lifting and try to walk some daily.  THN CM Short Term Goal #1 (0-30 days)  pt will attend all MD appointments within 30 days.  THN CM Short Term Goal #1 Start Date  07/12/16  Interventions for Short Term Goal #1  RN CM reviewed all upcoming appointments with pt and importance of attending, pt states does not see importance of  follow up with primary care MD but will be seeing surgeon.  THN CM Short Term Goal #2 (0-30 days)  Pt will verbalize ways to prevent falls within 30 days.  THN CM Short Term Goal #2 Start Date  07/12/16  Interventions for Short Term Goal #2  RN CM reviewed safety precautions and importance of using walker at all times, ask husband for assistance as needed, do not ambulate if dizzy, keep pathways clear.      PLAN Continue weekly transition of care calls  Jacqlyn Larsen [facility], [place] Coordinator [phone]

## 2016-07-13 DIAGNOSIS — Z23 Encounter for immunization: Secondary | ICD-10-CM | POA: Diagnosis not present

## 2016-07-17 ENCOUNTER — Other Ambulatory Visit: Payer: Self-pay | Admitting: *Deleted

## 2016-07-17 ENCOUNTER — Encounter: Payer: Self-pay | Admitting: *Deleted

## 2016-07-17 NOTE — Patient Outreach (Signed)
07/17/16- Telephone call to pt for transition of care, spoke with pt, HIPAA verified, pt reports she is to see surgeon on 07/25/16, pt states she will not see primary care MD yet and does not feel this is important to do so, states she can call MD if she needs anything.  Pt reports she is doing better, moving around well with walker, has had no recent falls, has shower seat in bathroom and husband and daughter assists her "with anything I need".  Pt states she got flu vaccine recently.  RN CM discussed with pt about doing home visit and pt states " what for?".  Pt states she has healed well so far after back surgery and states she "really doesn't have any needs" and does not feel that she needs [facility] program at present.  RN CM gave pt 24 hour nurse line number as reminder to call at anytime if she has any health related concerns, questions.  RN CM mailed case closure letter with [facility] contact numbers- to patient's home, faxed case closure letter to primary MD Dr. Consuello Masse.  [facility] CM Care Plan Problem One   Flowsheet Row Most Recent Value  Care Plan Problem One  Pt will heal without complications or infection (lumbar fusion)  Role Documenting the Problem One  Care Management [place] for Problem One  Active  THN Long Term Goal (31-90 days)  pt will heal without complications or infection within 45 days.  THN Long Term Goal Start Date  07/12/16  THN Long Term Goal Met Date  07/17/16  Interventions for Problem One Long Term Goal  RN CM reinforced signs/ symptoms infection, reportable signs/ symptoms, reviewed no heavy lifting and try to walk some daily.  THN CM Short Term Goal #1 (0-30 days)  pt will attend all MD appointments within 30 days.  THN CM Short Term Goal #1 Start Date  07/12/16  [facility] CM Short Term Goal #1 Met Date  07/17/16  Interventions for Short Term Goal #1  RN CM reviewed all upcoming appointments with pt and importance of attending, pt states does not see importance of follow  up with primary care MD but will be seeing surgeon.  THN CM Short Term Goal #2 (0-30 days)  Pt will verbalize ways to prevent falls within 30 days.  THN CM Short Term Goal #2 Start Date  07/12/16  [facility] CM Short Term Goal #2 Met Date  07/17/16  Interventions for Short Term Goal #2  RN CM reinforced safety precautions and importance of using walker at all times, ask husband for assistance as needed, do not ambulate if dizzy, keep pathways clear.     Discharge today  Jacqlyn Larsen [facility], BSN [place] Coordinator [phone]

## 2016-08-12 DIAGNOSIS — Z6823 Body mass index (BMI) 23.0-23.9, adult: Secondary | ICD-10-CM | POA: Diagnosis not present

## 2016-08-12 DIAGNOSIS — J0100 Acute maxillary sinusitis, unspecified: Secondary | ICD-10-CM | POA: Diagnosis not present

## 2016-09-18 DIAGNOSIS — I480 Paroxysmal atrial fibrillation: Secondary | ICD-10-CM | POA: Diagnosis not present

## 2016-09-18 DIAGNOSIS — E871 Hypo-osmolality and hyponatremia: Secondary | ICD-10-CM | POA: Diagnosis not present

## 2016-09-18 DIAGNOSIS — E7800 Pure hypercholesterolemia, unspecified: Secondary | ICD-10-CM | POA: Diagnosis not present

## 2016-09-18 DIAGNOSIS — N183 Chronic kidney disease, stage 3 (moderate): Secondary | ICD-10-CM | POA: Diagnosis not present

## 2016-09-18 DIAGNOSIS — I10 Essential (primary) hypertension: Secondary | ICD-10-CM | POA: Diagnosis not present

## 2016-09-22 DIAGNOSIS — I10 Essential (primary) hypertension: Secondary | ICD-10-CM | POA: Diagnosis not present

## 2016-09-22 DIAGNOSIS — E7800 Pure hypercholesterolemia, unspecified: Secondary | ICD-10-CM | POA: Diagnosis not present

## 2016-09-22 DIAGNOSIS — Z6824 Body mass index (BMI) 24.0-24.9, adult: Secondary | ICD-10-CM | POA: Diagnosis not present

## 2016-09-22 DIAGNOSIS — M25552 Pain in left hip: Secondary | ICD-10-CM | POA: Diagnosis not present

## 2016-09-22 DIAGNOSIS — I480 Paroxysmal atrial fibrillation: Secondary | ICD-10-CM | POA: Diagnosis not present

## 2016-09-22 DIAGNOSIS — N3000 Acute cystitis without hematuria: Secondary | ICD-10-CM | POA: Diagnosis not present

## 2016-11-15 DIAGNOSIS — R8271 Bacteriuria: Secondary | ICD-10-CM | POA: Diagnosis not present

## 2016-11-15 DIAGNOSIS — N200 Calculus of kidney: Secondary | ICD-10-CM | POA: Diagnosis not present

## 2016-11-23 DIAGNOSIS — M419 Scoliosis, unspecified: Secondary | ICD-10-CM | POA: Diagnosis not present

## 2016-11-23 DIAGNOSIS — M4716 Other spondylosis with myelopathy, lumbar region: Secondary | ICD-10-CM | POA: Diagnosis not present

## 2017-01-16 DIAGNOSIS — E7800 Pure hypercholesterolemia, unspecified: Secondary | ICD-10-CM | POA: Diagnosis not present

## 2017-01-16 DIAGNOSIS — E871 Hypo-osmolality and hyponatremia: Secondary | ICD-10-CM | POA: Diagnosis not present

## 2017-01-16 DIAGNOSIS — I10 Essential (primary) hypertension: Secondary | ICD-10-CM | POA: Diagnosis not present

## 2017-01-22 DIAGNOSIS — E7800 Pure hypercholesterolemia, unspecified: Secondary | ICD-10-CM | POA: Diagnosis not present

## 2017-01-22 DIAGNOSIS — B372 Candidiasis of skin and nail: Secondary | ICD-10-CM | POA: Diagnosis not present

## 2017-01-22 DIAGNOSIS — Z6824 Body mass index (BMI) 24.0-24.9, adult: Secondary | ICD-10-CM | POA: Diagnosis not present

## 2017-01-22 DIAGNOSIS — M25552 Pain in left hip: Secondary | ICD-10-CM | POA: Diagnosis not present

## 2017-01-22 DIAGNOSIS — I480 Paroxysmal atrial fibrillation: Secondary | ICD-10-CM | POA: Diagnosis not present

## 2017-01-22 DIAGNOSIS — I10 Essential (primary) hypertension: Secondary | ICD-10-CM | POA: Diagnosis not present

## 2017-02-15 DIAGNOSIS — M419 Scoliosis, unspecified: Secondary | ICD-10-CM | POA: Diagnosis not present

## 2017-02-15 DIAGNOSIS — M4716 Other spondylosis with myelopathy, lumbar region: Secondary | ICD-10-CM | POA: Diagnosis not present

## 2017-03-20 DIAGNOSIS — N3000 Acute cystitis without hematuria: Secondary | ICD-10-CM | POA: Diagnosis not present

## 2017-03-20 DIAGNOSIS — Z6826 Body mass index (BMI) 26.0-26.9, adult: Secondary | ICD-10-CM | POA: Diagnosis not present

## 2017-03-29 DIAGNOSIS — N390 Urinary tract infection, site not specified: Secondary | ICD-10-CM | POA: Diagnosis not present

## 2017-03-29 DIAGNOSIS — Z6825 Body mass index (BMI) 25.0-25.9, adult: Secondary | ICD-10-CM | POA: Diagnosis not present

## 2017-03-29 DIAGNOSIS — I480 Paroxysmal atrial fibrillation: Secondary | ICD-10-CM | POA: Diagnosis not present

## 2017-03-29 DIAGNOSIS — R141 Gas pain: Secondary | ICD-10-CM | POA: Diagnosis not present

## 2017-04-10 DIAGNOSIS — N200 Calculus of kidney: Secondary | ICD-10-CM | POA: Diagnosis not present

## 2017-04-10 DIAGNOSIS — N3020 Other chronic cystitis without hematuria: Secondary | ICD-10-CM | POA: Diagnosis not present

## 2017-04-10 DIAGNOSIS — R1030 Lower abdominal pain, unspecified: Secondary | ICD-10-CM | POA: Diagnosis not present

## 2017-04-28 ENCOUNTER — Inpatient Hospital Stay (HOSPITAL_COMMUNITY)
Admission: EM | Admit: 2017-04-28 | Discharge: 2017-04-30 | DRG: 292 | Disposition: A | Discharge status: Home / Self Care | Payer: PPO | Attending: Internal Medicine | Admitting: Internal Medicine

## 2017-04-28 ENCOUNTER — Emergency Department (HOSPITAL_COMMUNITY): Payer: PPO

## 2017-04-28 ENCOUNTER — Encounter (HOSPITAL_COMMUNITY): Payer: Self-pay | Admitting: Emergency Medicine

## 2017-04-28 DIAGNOSIS — J069 Acute upper respiratory infection, unspecified: Secondary | ICD-10-CM | POA: Diagnosis present

## 2017-04-28 DIAGNOSIS — E876 Hypokalemia: Secondary | ICD-10-CM | POA: Diagnosis not present

## 2017-04-28 DIAGNOSIS — Z9841 Cataract extraction status, right eye: Secondary | ICD-10-CM | POA: Diagnosis not present

## 2017-04-28 DIAGNOSIS — Z7951 Long term (current) use of inhaled steroids: Secondary | ICD-10-CM

## 2017-04-28 DIAGNOSIS — T502X5A Adverse effect of carbonic-anhydrase inhibitors, benzothiadiazides and other diuretics, initial encounter: Secondary | ICD-10-CM | POA: Diagnosis present

## 2017-04-28 DIAGNOSIS — R002 Palpitations: Secondary | ICD-10-CM | POA: Diagnosis not present

## 2017-04-28 DIAGNOSIS — I493 Ventricular premature depolarization: Secondary | ICD-10-CM | POA: Diagnosis present

## 2017-04-28 DIAGNOSIS — Z79899 Other long term (current) drug therapy: Secondary | ICD-10-CM

## 2017-04-28 DIAGNOSIS — R791 Abnormal coagulation profile: Secondary | ICD-10-CM | POA: Diagnosis present

## 2017-04-28 DIAGNOSIS — R0602 Shortness of breath: Secondary | ICD-10-CM | POA: Diagnosis not present

## 2017-04-28 DIAGNOSIS — E871 Hypo-osmolality and hyponatremia: Secondary | ICD-10-CM | POA: Diagnosis present

## 2017-04-28 DIAGNOSIS — Z9842 Cataract extraction status, left eye: Secondary | ICD-10-CM

## 2017-04-28 DIAGNOSIS — N201 Calculus of ureter: Secondary | ICD-10-CM | POA: Diagnosis present

## 2017-04-28 DIAGNOSIS — I5041 Acute combined systolic (congestive) and diastolic (congestive) heart failure: Secondary | ICD-10-CM | POA: Diagnosis not present

## 2017-04-28 DIAGNOSIS — I517 Cardiomegaly: Secondary | ICD-10-CM | POA: Diagnosis not present

## 2017-04-28 DIAGNOSIS — R000 Tachycardia, unspecified: Secondary | ICD-10-CM | POA: Diagnosis not present

## 2017-04-28 DIAGNOSIS — Z961 Presence of intraocular lens: Secondary | ICD-10-CM | POA: Diagnosis not present

## 2017-04-28 DIAGNOSIS — I110 Hypertensive heart disease with heart failure: Principal | ICD-10-CM | POA: Diagnosis present

## 2017-04-28 DIAGNOSIS — R7989 Other specified abnormal findings of blood chemistry: Secondary | ICD-10-CM | POA: Diagnosis not present

## 2017-04-28 DIAGNOSIS — I509 Heart failure, unspecified: Secondary | ICD-10-CM

## 2017-04-28 HISTORY — DX: Palpitations: R00.2

## 2017-04-28 LAB — BASIC METABOLIC PANEL
ANION GAP: 11 (ref 5–15)
BUN: 10 mg/dL (ref 6–20)
CALCIUM: 9.8 mg/dL (ref 8.9–10.3)
CO2: 24 mmol/L (ref 22–32)
CREATININE: 0.74 mg/dL (ref 0.44–1.00)
Chloride: 93 mmol/L — ABNORMAL LOW (ref 101–111)
GLUCOSE: 115 mg/dL — AB (ref 65–99)
Potassium: 3.5 mmol/L (ref 3.5–5.1)
Sodium: 128 mmol/L — ABNORMAL LOW (ref 135–145)

## 2017-04-28 LAB — BRAIN NATRIURETIC PEPTIDE: B Natriuretic Peptide: 2049 pg/mL — ABNORMAL HIGH (ref 0.0–100.0)

## 2017-04-28 LAB — CBC WITH DIFFERENTIAL/PLATELET
BASOS PCT: 1 %
Basophils Absolute: 0.1 10*3/uL (ref 0.0–0.1)
EOS ABS: 0.1 10*3/uL (ref 0.0–0.7)
EOS PCT: 1 %
HCT: 38.8 % (ref 36.0–46.0)
HEMOGLOBIN: 13.4 g/dL (ref 12.0–15.0)
LYMPHS ABS: 1.8 10*3/uL (ref 0.7–4.0)
Lymphocytes Relative: 17 %
MCH: 33.6 pg (ref 26.0–34.0)
MCHC: 34.5 g/dL (ref 30.0–36.0)
MCV: 97.2 fL (ref 78.0–100.0)
MONO ABS: 1.2 10*3/uL — AB (ref 0.1–1.0)
MONOS PCT: 12 %
NEUTROS PCT: 69 %
Neutro Abs: 7.3 10*3/uL (ref 1.7–7.7)
PLATELETS: 334 10*3/uL (ref 150–400)
RBC: 3.99 MIL/uL (ref 3.87–5.11)
RDW: 12.2 % (ref 11.5–15.5)
WBC: 10.5 10*3/uL (ref 4.0–10.5)

## 2017-04-28 LAB — URINALYSIS, ROUTINE W REFLEX MICROSCOPIC
BILIRUBIN URINE: NEGATIVE
GLUCOSE, UA: NEGATIVE mg/dL
HGB URINE DIPSTICK: NEGATIVE
KETONES UR: NEGATIVE mg/dL
Leukocytes, UA: NEGATIVE
Nitrite: NEGATIVE
PROTEIN: NEGATIVE mg/dL
Specific Gravity, Urine: 1.005 (ref 1.005–1.030)
pH: 7 (ref 5.0–8.0)

## 2017-04-28 LAB — MAGNESIUM: MAGNESIUM: 1.7 mg/dL (ref 1.7–2.4)

## 2017-04-28 LAB — T4, FREE: FREE T4: 0.89 ng/dL (ref 0.61–1.12)

## 2017-04-28 LAB — TSH: TSH: 1.406 u[IU]/mL (ref 0.350–4.500)

## 2017-04-28 LAB — TROPONIN I: Troponin I: <0.03 ng/mL (ref <0.03)

## 2017-04-28 LAB — D-DIMER, QUANTITATIVE (NOT AT ARMC): D DIMER QUANT: 0.91 ug{FEU}/mL — AB (ref 0.00–0.50)

## 2017-04-28 MED ORDER — CLONAZEPAM 0.5 MG PO TABS
0.2500 mg | ORAL_TABLET | Freq: Two times a day (BID) | ORAL | Status: DC | PRN
  Administered 2017-04-28 – 2017-04-29 (×2): 0.25 mg via ORAL
  Filled 2017-04-28 (×2): qty 1

## 2017-04-28 MED ORDER — LEVALBUTEROL HCL 0.63 MG/3ML IN NEBU
0.6300 mg | INHALATION_SOLUTION | Freq: Four times a day (QID) | RESPIRATORY_TRACT | Status: DC
  Administered 2017-04-28 – 2017-04-29 (×5): 0.63 mg via RESPIRATORY_TRACT
  Filled 2017-04-28 (×6): qty 3

## 2017-04-28 MED ORDER — SALINE SPRAY 0.65 % NA SOLN: 1.0000 | NASAL | Status: DC | PRN

## 2017-04-28 MED ORDER — ATENOLOL 25 MG PO TABS
50.0000 mg | ORAL_TABLET | Freq: Two times a day (BID) | ORAL | Status: DC
  Administered 2017-04-28 – 2017-04-29 (×2): 50 mg via ORAL
  Filled 2017-04-28 (×2): qty 2

## 2017-04-28 MED ORDER — IOPAMIDOL (ISOVUE-370) INJECTION 76%
100.0000 mL | Freq: Once | INTRAVENOUS | Status: AC | PRN
  Administered 2017-04-28: 100 mL via INTRAVENOUS

## 2017-04-28 MED ORDER — POTASSIUM CHLORIDE CRYS ER 20 MEQ PO TBCR
40.0000 meq | EXTENDED_RELEASE_TABLET | Freq: Two times a day (BID) | ORAL | Status: AC
  Administered 2017-04-28 (×2): 40 meq via ORAL
  Filled 2017-04-28 (×2): qty 2

## 2017-04-28 MED ORDER — MAGNESIUM SULFATE 50 % IJ SOLN
2.0000 g | Freq: Once | INTRAVENOUS | Status: AC
  Administered 2017-04-28: 2 g via INTRAVENOUS
  Filled 2017-04-28: qty 4

## 2017-04-28 MED ORDER — MAGNESIUM OXIDE 400 (241.3 MG) MG PO TABS
400.0000 mg | ORAL_TABLET | Freq: Every day | ORAL | Status: DC
  Administered 2017-04-28 – 2017-04-30 (×3): 400 mg via ORAL
  Filled 2017-04-28 (×3): qty 1

## 2017-04-28 MED ORDER — ENOXAPARIN SODIUM 40 MG/0.4ML ~~LOC~~ SOLN
40.0000 mg | SUBCUTANEOUS | Status: DC
  Administered 2017-04-28 – 2017-04-29 (×2): 40 mg via SUBCUTANEOUS
  Filled 2017-04-28 (×2): qty 0.4

## 2017-04-28 MED ORDER — ACETAMINOPHEN 650 MG RE SUPP: 650.0000 mg | Freq: Four times a day (QID) | RECTAL | Status: DC | PRN

## 2017-04-28 MED ORDER — ONDANSETRON HCL 4 MG PO TABS: 4.0000 mg | ORAL_TABLET | Freq: Four times a day (QID) | ORAL | Status: DC | PRN

## 2017-04-28 MED ORDER — FLUTICASONE PROPIONATE 50 MCG/ACT NA SUSP
1.0000 | Freq: Every day | NASAL | Status: DC
  Administered 2017-04-28 – 2017-04-30 (×3): 1 via NASAL
  Filled 2017-04-28: qty 16

## 2017-04-28 MED ORDER — POTASSIUM CHLORIDE CRYS ER 20 MEQ PO TBCR
40.0000 meq | EXTENDED_RELEASE_TABLET | Freq: Once | ORAL | Status: AC
  Administered 2017-04-28: 40 meq via ORAL
  Filled 2017-04-28: qty 2

## 2017-04-28 MED ORDER — FUROSEMIDE 10 MG/ML IJ SOLN
40.0000 mg | Freq: Two times a day (BID) | INTRAMUSCULAR | Status: DC
  Administered 2017-04-28 – 2017-04-30 (×4): 40 mg via INTRAVENOUS
  Filled 2017-04-28 (×4): qty 4

## 2017-04-28 MED ORDER — ONDANSETRON HCL 4 MG/2ML IJ SOLN: 4.0000 mg | Freq: Four times a day (QID) | INTRAMUSCULAR | Status: DC | PRN

## 2017-04-28 MED ORDER — ACETAMINOPHEN 325 MG PO TABS: 650.0000 mg | ORAL_TABLET | Freq: Four times a day (QID) | ORAL | Status: DC | PRN

## 2017-04-28 MED ORDER — DIAZEPAM 5 MG/ML IJ SOLN
2.5000 mg | Freq: Once | INTRAMUSCULAR | Status: AC
  Administered 2017-04-28: 2.5 mg via INTRAVENOUS
  Filled 2017-04-28: qty 2

## 2017-04-28 MED ORDER — OXYCODONE-ACETAMINOPHEN 7.5-325 MG PO TABS
1.0000 | ORAL_TABLET | Freq: Four times a day (QID) | ORAL | Status: DC | PRN
  Administered 2017-04-28 – 2017-04-29 (×4): 1 via ORAL
  Filled 2017-04-28 (×4): qty 1

## 2017-04-28 NOTE — ED Provider Notes (Signed)
[facility] DEPT Provider Note   CSN: [id number] Arrival date & time: 04/28/17  1019     History   Chief Complaint Chief Complaint  Patient presents with  . Palpitations    HPI Tanya Mueller is a 82 y.o. female.  HPI  Pt was seen at 1035. Per pt, c/o gradual onset and persistence of multiple intermittent episodes of palpitations that began several months ago. Pt states she "feels like they're coming more frequently" over the past few weeks.  Has been associated with DOE for the past 2 weeks. Denies CP, no cough, no abd pain, no N/V/D, no back pain, no focal motor weakness, no tingling/numbness in extremities.   Past Medical History:  Diagnosis Date  . Allergic rhinitis   . Frequency of urination   . History of atrial fibrillation    episode 2013 converted with cardizem-- happened at time acute illness due to kidney stone --  resolved  no issues since  . History of kidney stones   . History of sepsis    urosepsis  2013 due to kidney stone  . Intermittent palpitations    controlled w/ atenolol  . Left nephrolithiasis   . Left ureteral calculus   . PAC (premature atrial contraction)   . Palpitations   . Solitary lung nodule    benign right middle lobe per CT  . Urgency of urination   . Wears dentures    upper  . Wears partial dentures    lower    Patient Active Problem List   Diagnosis Date Noted  . Kidney stone 08/09/2014  . Ureteral calculus 08/09/2014  . Palpitations 05/06/2012    Past Surgical History:  Procedure Laterality Date  . CATARACT EXTRACTION W/ INTRAOCULAR LENS  IMPLANT, BILATERAL  2010  . CYSTOSCOPY WITH STENT PLACEMENT Left 09/15/2014   Procedure: CYSTOSCOPY WITH STENT PLACEMENT;  Surgeon: Georgette Dover, MD;  Location: [facility];  Service: Urology;  Laterality: Left;  . CYSTOSCOPY/RETROGRADE/URETEROSCOPY Left 08/09/2014   Procedure: CYSTOSCOPY/RETROGRADE/LEFT STENT;  Surgeon: Bernestine Amass, MD;  Location: [facility];   Service: Urology;  Laterality: Left;  . EXTRACORPOREAL SHOCK WAVE LITHOTRIPSY  2013  . HOLMIUM LASER APPLICATION Left 85/63/1497   Procedure: HOLMIUM LASER APPLICATION;  Surgeon: Georgette Dover, MD;  Location: [facility];  Service: Urology;  Laterality: Left;  . LAPAROTOMY W/  UNILATERAL SALPINGOOPHORECTOMY  age 62  . TOTAL ABDOMINAL HYSTERECTOMY  age 86   w/ unilateral salpingoophorectomy  . TRANSTHORACIC ECHOCARDIOGRAM  02-13-2012   mild LVH/  ef 65%/  trivial MR  &  PR/  mild RVE  . URETEROSCOPY Left 09/15/2014   Procedure: URETEROSCOPY;  Surgeon: Georgette Dover, MD;  Location: [facility];  Service: Urology;  Laterality: Left;    OB History    No data available       Home Medications    Prior to Admission medications   Medication Sig Start Date End Date Taking? Authorizing Provider  acetaminophen-codeine (TYLENOL #3) 300-30 MG per tablet Take 1 tablet by mouth 4 (four) times daily as needed (pain).    [provider]  atenolol (TENORMIN) 50 MG tablet Take 50 mg by mouth 2 (two) times daily.     [provider]  cholecalciferol (VITAMIN D) 1000 UNITS tablet Take 1,000 Units by mouth daily.    [provider]  fluticasone (FLONASE) 50 MCG/ACT nasal spray Place 2 sprays into the nose daily as needed for allergies.  [provider]  hydrochlorothiazide (HYDRODIURIL) 25 MG tablet Take 25 mg by mouth daily.    [provider]  Multiple Vitamins-Minerals (MULTIVITAMIN WITH MINERALS) tablet Take 1 tablet by mouth daily.    [provider]  ondansetron (ZOFRAN-ODT) 8 MG disintegrating tablet Take 8 mg by mouth every 6 (six) hours as needed for nausea.     [provider]  oxyCODONE-acetaminophen (ROXICET) 5-325 MG per tablet Take 1 tablet by mouth every 6 (six) hours as needed for severe pain. 09/15/14   Festus Aloe, MD  Psyllium (METAMUCIL PO) Take by mouth as needed.    [provider]    Family History Family History  Problem Relation Age of Onset  . Cancer Unknown        Lung and bladder  . Cancer Unknown        "Bone marrow cancer"    Social History Social History  Substance Use Topics  . Smoking status: Never Smoker  . Smokeless tobacco: Never Used  . Alcohol use No     Allergies   Patient has no known allergies.   Review of Systems Review of Systems ROS: Statement: All systems negative except as marked or noted in the HPI; Constitutional: Negative for fever and chills. ; ; Eyes: Negative for eye pain, redness and discharge. ; ; ENMT: Negative for ear pain, hoarseness, nasal congestion, sinus pressure and sore throat. ; ; Cardiovascular: +palpitations, DOE. Negative for chest pain, diaphoresis, and peripheral edema. ; ; Respiratory: Negative for cough, wheezing and stridor. ; ; Gastrointestinal: Negative for nausea, vomiting, diarrhea, abdominal pain, blood in stool, hematemesis, jaundice and rectal bleeding. . ; ; Genitourinary: Negative for dysuria, flank pain and hematuria. ; ; Musculoskeletal: Negative for back pain and neck pain. Negative for swelling and trauma.; ; Skin: Negative for pruritus, rash, abrasions, blisters, bruising and skin lesion.; ; Neuro: Negative for headache, lightheadedness and neck stiffness. Negative for weakness, altered level of consciousness, altered mental status, extremity weakness, paresthesias, involuntary movement, seizure and syncope.     Physical Exam Updated Vital Signs BP (!) 161/62 (BP Location: Left Arm)   Pulse 73   Temp 98.5 F (36.9 C) (Oral)   Resp (!) 22   Ht 5\' 6"  (1.676 m)   Wt 72.1 kg (159 lb)   SpO2 100%   BMI 25.66 kg/m   Physical Exam 1040: Physical examination:  Nursing notes reviewed; Vital signs and O2 SAT reviewed;  Constitutional: Well developed, Well nourished, Well hydrated, In no acute distress; Head:  Normocephalic, atraumatic; Eyes: EOMI, PERRL, No scleral icterus; ENMT:  Mouth and pharynx normal, Mucous membranes moist; Neck: Supple, Full range of motion, No lymphadenopathy; Cardiovascular: Regular rate and rhythm, No gallop; Respiratory: Breath sounds coarse & equal bilaterally, No wheezes.  Speaking full sentences with ease, Normal respiratory effort/excursion; Chest: Nontender, Movement normal; Abdomen: Soft, Nontender, Nondistended, Normal bowel sounds; Genitourinary: No CVA tenderness; Extremities: Pulses normal, No tenderness, No edema, No calf edema or asymmetry.; Neuro: AA&Ox3, Major CN grossly intact. Speech clear. No gross focal motor or sensory deficits in extremities.; Skin: Color normal, Warm, Dry.; Psych:  Anxious.     ED Treatments / Results  Labs (all labs ordered are listed, but only abnormal results are displayed)   EKG  EKG Interpretation  Date/Time:  Saturday April 28 2017 10:29:59 EDT Ventricular Rate:  80 PR Interval:    QRS Duration: 83 QT Interval:  358 QTC Calculation: 392 R Axis:   77 Text Interpretation:  Sinus rhythm Atrial premature complexes Abnormal R-wave progression, early transition Baseline wander When compared with ECG of 08/09/2014 No significant change was found Confirmed by [facility]  MD, Nunzio Cory [phone]) on 04/28/2017 10:44:17 AM       Radiology   Procedures Procedures (including critical care time)  Medications Ordered in ED Medications - No data to display   Initial Impression / Assessment and Plan / ED Course  I have reviewed the triage vital signs and the nursing notes.  Pertinent labs & imaging results that were available during my care of the patient were reviewed by me and considered in my medical decision making (see chart for details).  MDM Reviewed: previous chart, nursing note and vitals Reviewed previous: labs and ECG Interpretation: labs, ECG and x-ray Total time providing critical care: 30-74 minutes. This excludes time spent performing separately reportable procedures and  services. Consults: admitting MD   CRITICAL CARE Performed by: Alfonzo Feller Total critical care time: 35 minutes Critical care time was exclusive of separately billable procedures and treating other patients. Critical care was necessary to treat or prevent imminent or life-threatening deterioration. Critical care was time spent personally by me on the following activities: development of treatment plan with patient and/or surrogate as well as nursing, discussions with consultants, evaluation of patient's response to treatment, examination of patient, obtaining history from patient or surrogate, ordering and performing treatments and interventions, ordering and review of laboratory studies, ordering and review of radiographic studies, pulse oximetry and re-evaluation of patient's condition.    Results for orders placed or performed during the hospital encounter of 59/56/38  Basic metabolic panel  Result Value Ref Range   Sodium 128 (L) 135 - 145 mmol/L   Potassium 3.5 3.5 - 5.1 mmol/L   Chloride 93 (L) 101 - 111 mmol/L   CO2 24 22 - 32 mmol/L   Glucose, Bld 115 (H) 65 - 99 mg/dL   BUN 10 6 - 20 mg/dL   Creatinine, Ser 0.74 0.44 - 1.00 mg/dL   Calcium 9.8 8.9 - 10.3 mg/dL   GFR calc non Af Amer >60 >60 mL/min   GFR calc Af Amer >60 >60 mL/min   Anion gap 11 5 - 15  Troponin I  Result Value Ref Range   Troponin I <0.03 <0.03 ng/mL  CBC with Differential  Result Value Ref Range   WBC 10.5 4.0 - 10.5 K/uL   RBC 3.99 3.87 - 5.11 MIL/uL   Hemoglobin 13.4 12.0 - 15.0 g/dL   HCT 38.8 36.0 - 46.0 %   MCV 97.2 78.0 - 100.0 fL   MCH 33.6 26.0 - 34.0 pg   MCHC 34.5 30.0 - 36.0 g/dL   RDW 12.2 11.5 - 15.5 %   Platelets 334 150 - 400 K/uL   Neutrophils Relative % 69 %   Neutro Abs 7.3 1.7 - 7.7 K/uL   Lymphocytes Relative 17 %   Lymphs Abs 1.8 0.7 - 4.0 K/uL   Monocytes Relative 12 %   Monocytes Absolute 1.2 (H) 0.1 - 1.0 K/uL   Eosinophils Relative 1 %   Eosinophils Absolute  0.1 0.0 - 0.7 K/uL   Basophils Relative 1 %   Basophils Absolute 0.1 0.0 - 0.1 K/uL  Magnesium  Result Value Ref Range   Magnesium 1.7 1.7 - 2.4 mg/dL  Urinalysis, Routine w reflex microscopic  Result Value Ref Range   Color, Urine STRAW (A) YELLOW   APPearance CLEAR CLEAR   Specific Gravity, Urine 1.005 1.005 - 1.030  pH 7.0 5.0 - 8.0   Glucose, UA NEGATIVE NEGATIVE mg/dL   Hgb urine dipstick NEGATIVE NEGATIVE   Bilirubin Urine NEGATIVE NEGATIVE   Ketones, ur NEGATIVE NEGATIVE mg/dL   Protein, ur NEGATIVE NEGATIVE mg/dL   Nitrite NEGATIVE NEGATIVE   Leukocytes, UA NEGATIVE NEGATIVE  D-dimer, quantitative  Result Value Ref Range   D-Dimer, Quant 0.91 (H) 0.00 - 0.50 ug/mL-FEU  Brain natriuretic peptide  Result Value Ref Range   B Natriuretic Peptide 2,049.0 (H) 0.0 - 100.0 pg/mL   Dg Chest 2 View Result Date: 04/28/2017 CLINICAL DATA:  Tachycardia.  Palpitations. EXAM: CHEST  2 VIEW COMPARISON:  Chest radiograph 02/13/2012. FINDINGS: Monitoring leads overlie the patient. Stable cardiomegaly and tortuosity of the thoracic aorta. Emphysematous change. Pulmonary vascular redistribution. Pulmonary hyperinflation. Thoracic spine degenerative changes. No definite pleural effusion or pneumothorax. IMPRESSION: Cardiomegaly and pulmonary vascular redistribution. Cannot exclude mild interstitial edema. Pulmonary hyperinflation. Electronically Signed   By: [name] Newcomer M.D.   On: 04/28/2017 11:16   Ct Angio Chest Pe W/cm &/or Wo Cm Result Date: 04/28/2017 CLINICAL DATA:  Heart palpitations.  Elevated D-dimer EXAM: CT ANGIOGRAPHY CHEST WITH CONTRAST TECHNIQUE: Multidetector CT imaging of the chest was performed using the standard protocol during bolus administration of intravenous contrast. Multiplanar CT image reconstructions and MIPs were obtained to evaluate the vascular anatomy. CONTRAST:  70 cc Isovue 370 IV COMPARISON:  Chest x-ray earlier today FINDINGS: Cardiovascular: No filling defects in  the pulmonary arteries to suggest pulmonary emboli. Heart is upper limits normal in size. Aorta is normal caliber. Scattered aortic calcifications. Mediastinum/Nodes: No mediastinal, hilar, or axillary adenopathy. Lungs/Pleura: Areas of atelectasis in the lower lobes bilaterally. No effusions. Upper Abdomen: Imaging into the upper abdomen shows no acute findings. Musculoskeletal: Chest wall soft tissues are unremarkable. No acute bony abnormality. Review of the MIP images confirms the above findings. IMPRESSION: No evidence of pulmonary embolus. Bilateral lower lobe atelectasis. No acute findings. Aortic Atherosclerosis (ICD10-I70.0). Electronically Signed   By: Rolm Baptise M.D.   On: 04/28/2017 12:43    1330:  EKG and monitor in ED with NSR/PAC's, no afib while in the ED.  New hyponatremia on labs today. Potassium repleted PO. Magnesium repleted IV. New BNP elevation; will dose IV lasix, admit. Dx and testing d/w pt and family.  Questions answered.  Verb understanding, agreeable to admit. T/C to [facility] Dr. Allyson Sabal, case discussed, including:  HPI, pertinent PM/SHx, VS/PE, dx testing, ED course and treatment:  Agreeable to admit.    Final Clinical Impressions(s) / ED Diagnoses   Final diagnoses:  None    New Prescriptions New Prescriptions   No medications on file     Francine Graven, DO 04/30/17 [id number]

## 2017-04-28 NOTE — ED Triage Notes (Signed)
Pt c/o heart palpitaions x 2 weeiks and getting worse. Denies any other sxs or pain. Nad. Color wnl. Non diaphoretic. Hx of a fib

## 2017-04-28 NOTE — ED Notes (Signed)
Pt ambulated well. O2 stayed 98-100.

## 2017-04-28 NOTE — H&P (Addendum)
[facility] History and Physical  Tanya Mueller:[id number] DOB: 1935/10/02 DOA: 04/28/2017  Referring physician: ER * PCP: Manon Hilding, MD   Chief Complaint palpitations 82 year old female with a history of PACs,/atrial fibrillation on atenolol, essential hypertension, presents to the ED today with gradual onset of intermittent palpitations associated with dyspnea on exertion 2 weeks. Patient has had URI-like symptoms for 2 weeks and started taking over-the-counter sinus medication. She feels that this was Mucinex. She still has URI-like symptoms and is requesting something for nasal congestion. Patient denied chest pain. Patient denied any recent intercurrent illness. No prior history of coronary artery disease. Denies any history of orthopnea, dependent edema, ED course BP (!) 161/62 (BP Location: Left Arm)   Pulse 73   Temp 98.5 F (36.9 C) (Oral)   Resp (!) 22    EKG shows sinus rhythm with PACs, sodium 128, potassium 3.5, creatinine 0.74, BNP, 2049 Patient admitted for rule out arrhythmia/CHF    Review of Systems: negative for the following  Constitutional: Denies fever, chills, diaphoresis, appetite change and fatigue.  HEENT: Denies photophobia, eye pain, redness, hearing loss, ear pain, congestion, sore throat, rhinorrhea, sneezing, mouth sores, trouble swallowing, neck pain, neck stiffness and tinnitus.  Respiratory: Denies SOB, DOE, cough, chest tightness, and wheezing.  Cardiovascular: Denies chest pain, positive for palpitations   Gastrointestinal: Denies nausea, vomiting, abdominal pain, diarrhea, constipation, blood in stool and abdominal distention.  Genitourinary: Denies dysuria, urgency, frequency, hematuria, flank pain and difficulty urinating.  Musculoskeletal: Denies myalgias, back pain, joint swelling, arthralgias and gait problem.  Skin: Denies pallor, rash and wound.  Neurological: Denies dizziness, seizures, syncope, weakness, light-headedness,  numbness and headaches.  Hematological: Denies adenopathy. Easy bruising, personal or family bleeding history  Psychiatric/Behavioral: Denies suicidal ideation, mood changes, confusion, nervousness, sleep disturbance and agitation       Past Medical History:  Diagnosis Date  . Allergic rhinitis   . Frequency of urination   . History of atrial fibrillation    episode 2013 converted with cardizem-- happened at time acute illness due to kidney stone --  resolved  no issues since  . History of kidney stones   . History of sepsis    urosepsis  2013 due to kidney stone  . Intermittent palpitations    controlled w/ atenolol  . Left nephrolithiasis   . Left ureteral calculus   . PAC (premature atrial contraction)   . Palpitations   . Solitary lung nodule    benign right middle lobe per CT  . Urgency of urination   . Wears dentures    upper  . Wears partial dentures    lower     Past Surgical History:  Procedure Laterality Date  . CATARACT EXTRACTION W/ INTRAOCULAR LENS  IMPLANT, BILATERAL  2010  . CYSTOSCOPY WITH STENT PLACEMENT Left 09/15/2014   Procedure: CYSTOSCOPY WITH STENT PLACEMENT;  Surgeon: Georgette Dover, MD;  Location: [facility];  Service: Urology;  Laterality: Left;  . CYSTOSCOPY/RETROGRADE/URETEROSCOPY Left 08/09/2014   Procedure: CYSTOSCOPY/RETROGRADE/LEFT STENT;  Surgeon: Bernestine Amass, MD;  Location: [facility];  Service: Urology;  Laterality: Left;  . EXTRACORPOREAL SHOCK WAVE LITHOTRIPSY  2013  . HOLMIUM LASER APPLICATION Left 35/00/9381   Procedure: HOLMIUM LASER APPLICATION;  Surgeon: Georgette Dover, MD;  Location: [facility];  Service: Urology;  Laterality: Left;  . LAPAROTOMY W/  UNILATERAL SALPINGOOPHORECTOMY  age 24  . TOTAL ABDOMINAL HYSTERECTOMY  age 53   w/ unilateral salpingoophorectomy  .  TRANSTHORACIC ECHOCARDIOGRAM  02-13-2012   mild LVH/  ef 65%/  trivial MR  &  PR/  mild RVE  . URETEROSCOPY Left 09/15/2014    Procedure: URETEROSCOPY;  Surgeon: Georgette Dover, MD;  Location: [facility];  Service: Urology;  Laterality: Left;      Social History:  reports that she has never smoked. She has never used smokeless tobacco. She reports that she does not drink alcohol or use drugs.    No Known Allergies  Family History  Problem Relation Age of Onset  . Cancer Unknown        Lung and bladder  . Cancer Unknown        "Bone marrow cancer"        Prior to Admission medications   Medication Sig Start Date End Date Taking? Authorizing Provider  atenolol (TENORMIN) 50 MG tablet Take 50 mg by mouth 2 (two) times daily.    Yes [provider]  fluticasone (FLONASE) 50 MCG/ACT nasal spray Place 2 sprays into the nose daily as needed for allergies.    Yes [provider]  hydrochlorothiazide (HYDRODIURIL) 25 MG tablet Take 25 mg by mouth daily.   Yes [provider]  losartan (COZAAR) 50 MG tablet Take 50 mg by mouth daily.   Yes [provider]  methenamine (HIPREX) 1 g tablet 2 (two) times daily with a meal. Take 1/2 tablet twice a day.   Yes [provider]  oxyCODONE-acetaminophen (PERCOCET) 7.5-325 MG tablet Take 1 tablet by mouth every 6 (six) hours as needed for severe pain.   Yes [provider]  Psyllium (METAMUCIL PO) Take by mouth as needed.   Yes [provider]  trimethoprim (TRIMPEX) 100 MG tablet Take 100 mg by mouth at bedtime.   Yes [provider]     Physical Exam: Vitals:   04/28/17 1130 04/28/17 1145 04/28/17 1200 04/28/17 1300  BP: (!) 169/69  (!) 167/72 (!) 162/64  Pulse:  70 72   Resp: 19 12 18 15   Temp:      TempSrc:      SpO2:  96% 99%   Weight:      Height:            Vitals:   04/28/17 1130 04/28/17 1145 04/28/17 1200 04/28/17 1300  BP: (!) 169/69  (!) 167/72 (!) 162/64  Pulse:  70 72   Resp: 19 12 18 15   Temp:      TempSrc:      SpO2:  96% 99%   Weight:       Height:       Constitutional: NAD, calm, comfortable Eyes: PERRL, lids and conjunctivae normal ENMT: Mucous membranes are moist. Posterior pharynx clear of any exudate or lesions.Normal dentition.  Neck: normal, supple, no masses, no thyromegaly Respiratory: clear to auscultation bilaterally, no wheezing, no crackles. Normal respiratory effort. No accessory muscle use.  Cardiovascular: Irregularly irregular, no murmurs / rubs / gallops. No extremity edema. 2+ pedal pulses. No carotid bruits.  Abdomen: no tenderness, no masses palpated. No hepatosplenomegaly. Bowel sounds positive.  Musculoskeletal: no clubbing / cyanosis. No joint deformity upper and lower extremities. Good ROM, no contractures. Normal muscle tone.  Skin: no rashes, lesions, ulcers. No induration Neurologic: CN 2-12 grossly intact. Sensation intact, DTR normal. Strength 5/5 in all 4.  Psychiatric: Normal judgment and insight. Alert and oriented x 3. Normal mood.     Labs on Admission: I have personally reviewed following labs  and imaging studies  CBC:  Recent Labs Lab 04/28/17 1050  WBC 10.5  NEUTROABS 7.3  HGB 13.4  HCT 38.8  MCV 97.2  PLT 854    Basic Metabolic Panel:  Recent Labs Lab 04/28/17 1050  NA 128*  K 3.5  CL 93*  CO2 24  GLUCOSE 115*  BUN 10  CREATININE 0.74  CALCIUM 9.8  MG 1.7    GFR: Estimated Creatinine Clearance: 57 mL/min (by C-G formula based on SCr of 0.74 mg/dL).  Liver Function Tests: No results for input(s): AST, ALT, ALKPHOS, BILITOT, PROT, ALBUMIN in the last 168 hours. No results for input(s): LIPASE, AMYLASE in the last 168 hours. No results for input(s): AMMONIA in the last 168 hours.  Coagulation Profile: No results for input(s): INR, PROTIME in the last 168 hours.  Recent Labs  04/28/17 1050  DDIMER 0.91*    Cardiac Enzymes:  Recent Labs Lab 04/28/17 1050  TROPONINI <0.03    BNP (last 3 results) No results for input(s): PROBNP in the last 8760  hours.  HbA1C: No results for input(s): HGBA1C in the last 72 hours. No results found for: HGBA1C   CBG: No results for input(s): GLUCAP in the last 168 hours.  Lipid Profile: No results for input(s): CHOL, HDL, LDLCALC, TRIG, CHOLHDL, LDLDIRECT in the last 72 hours.  Thyroid Function Tests: No results for input(s): TSH, T4TOTAL, FREET4, T3FREE, THYROIDAB in the last 72 hours.  Anemia Panel: No results for input(s): VITAMINB12, FOLATE, FERRITIN, TIBC, IRON, RETICCTPCT in the last 72 hours.  Urine analysis:    Component Value Date/Time   COLORURINE STRAW (A) 04/28/2017 1040   APPEARANCEUR CLEAR 04/28/2017 1040   LABSPEC 1.005 04/28/2017 1040   PHURINE 7.0 04/28/2017 1040   GLUCOSEU NEGATIVE 04/28/2017 1040   HGBUR NEGATIVE 04/28/2017 1040   BILIRUBINUR NEGATIVE 04/28/2017 1040   KETONESUR NEGATIVE 04/28/2017 1040   PROTEINUR NEGATIVE 04/28/2017 1040   UROBILINOGEN 0.2 08/09/2014 1230   NITRITE NEGATIVE 04/28/2017 1040   LEUKOCYTESUR NEGATIVE 04/28/2017 1040    Sepsis Labs: @LABRCNTIP (procalcitonin:4,lacticidven:4) )No results found for this or any previous visit (from the past 240 hour(s)).       Radiological Exams on Admission: Dg Chest 2 View  Result Date: 04/28/2017 CLINICAL DATA:  Tachycardia.  Palpitations. EXAM: CHEST  2 VIEW COMPARISON:  Chest radiograph 02/13/2012. FINDINGS: Monitoring leads overlie the patient. Stable cardiomegaly and tortuosity of the thoracic aorta. Emphysematous change. Pulmonary vascular redistribution. Pulmonary hyperinflation. Thoracic spine degenerative changes. No definite pleural effusion or pneumothorax. IMPRESSION: Cardiomegaly and pulmonary vascular redistribution. Cannot exclude mild interstitial edema. Pulmonary hyperinflation. Electronically Signed   By: [name] Newcomer M.D.   On: 04/28/2017 11:16   Ct Angio Chest Pe W/cm &/or Wo Cm  Result Date: 04/28/2017 CLINICAL DATA:  Heart palpitations.  Elevated D-dimer EXAM: CT ANGIOGRAPHY  CHEST WITH CONTRAST TECHNIQUE: Multidetector CT imaging of the chest was performed using the standard protocol during bolus administration of intravenous contrast. Multiplanar CT image reconstructions and MIPs were obtained to evaluate the vascular anatomy. CONTRAST:  70 cc Isovue 370 IV COMPARISON:  Chest x-ray earlier today FINDINGS: Cardiovascular: No filling defects in the pulmonary arteries to suggest pulmonary emboli. Heart is upper limits normal in size. Aorta is normal caliber. Scattered aortic calcifications. Mediastinum/Nodes: No mediastinal, hilar, or axillary adenopathy. Lungs/Pleura: Areas of atelectasis in the lower lobes bilaterally. No effusions. Upper Abdomen: Imaging into the upper abdomen shows no acute findings. Musculoskeletal: Chest wall soft tissues are unremarkable. No acute bony abnormality.  Review of the MIP images confirms the above findings. IMPRESSION: No evidence of pulmonary embolus. Bilateral lower lobe atelectasis. No acute findings. Aortic Atherosclerosis (ICD10-I70.0). Electronically Signed   By: Rolm Baptise M.D.   On: 04/28/2017 12:43   Dg Chest 2 View  Result Date: 04/28/2017 CLINICAL DATA:  Tachycardia.  Palpitations. EXAM: CHEST  2 VIEW COMPARISON:  Chest radiograph 02/13/2012. FINDINGS: Monitoring leads overlie the patient. Stable cardiomegaly and tortuosity of the thoracic aorta. Emphysematous change. Pulmonary vascular redistribution. Pulmonary hyperinflation. Thoracic spine degenerative changes. No definite pleural effusion or pneumothorax. IMPRESSION: Cardiomegaly and pulmonary vascular redistribution. Cannot exclude mild interstitial edema. Pulmonary hyperinflation. Electronically Signed   By: [name] Newcomer M.D.   On: 04/28/2017 11:16   Ct Angio Chest Pe W/cm &/or Wo Cm  Result Date: 04/28/2017 CLINICAL DATA:  Heart palpitations.  Elevated D-dimer EXAM: CT ANGIOGRAPHY CHEST WITH CONTRAST TECHNIQUE: Multidetector CT imaging of the chest was performed using the  standard protocol during bolus administration of intravenous contrast. Multiplanar CT image reconstructions and MIPs were obtained to evaluate the vascular anatomy. CONTRAST:  70 cc Isovue 370 IV COMPARISON:  Chest x-ray earlier today FINDINGS: Cardiovascular: No filling defects in the pulmonary arteries to suggest pulmonary emboli. Heart is upper limits normal in size. Aorta is normal caliber. Scattered aortic calcifications. Mediastinum/Nodes: No mediastinal, hilar, or axillary adenopathy. Lungs/Pleura: Areas of atelectasis in the lower lobes bilaterally. No effusions. Upper Abdomen: Imaging into the upper abdomen shows no acute findings. Musculoskeletal: Chest wall soft tissues are unremarkable. No acute bony abnormality. Review of the MIP images confirms the above findings. IMPRESSION: No evidence of pulmonary embolus. Bilateral lower lobe atelectasis. No acute findings. Aortic Atherosclerosis (ICD10-I70.0). Electronically Signed   By: Rolm Baptise M.D.   On: 04/28/2017 12:43      EKG: Independently reviewed.  Sinus rhythm with PACs  Assessment/Plan Principal Problem:  Palpitations-likely PACs Patient will be admitted to telemetry Cannot rule out paroxysmal A. fib/flutter Patient has never had a Holter or loop Check TSH, free T4 , [history of goiter] Patient may benefit from outpatient 30 day Holter monitor Symptoms could have been exacerbated due to recent URI, patient is absolutely sure that her over-the-counter medications did not have pseudoephedrine  Will continue atenolol    Acute combined  congestive heart failure (HCC) BNP elevated Chest x-ray shows cardiomegaly with pulmonary vascular congestion Patient will be treated with Lasix IV 2-D echo to rule out congestive heart failure  Hypokalemia/hypomagnesemia-replete  Hyponatremia likely secondary to HCTZ, hold HCTZ  URI-continue Flonase and will start Ocean nasal spray      DVT prophylaxis: Lovenox     Code Status  Orders full code        consults called:None  Family Communication: Admission, patients condition and plan of care including tests being ordered have been discussed with the patient  who indicates understanding and agree with the plan and Code Status  Admission status: inpatient    Disposition plan: Further plan will depend as patient's clinical course evolves and further radiologic and laboratory data become available. Likely home when stable   At the time of admission, it appears that the appropriate admission status for this patient is INPATIENT .Thisis judged to be reasonable and necessary in order to provide the required intensity of service to ensure the patient's safetygiven thepresenting symptoms, physical exam findings, and initial radiographic and laboratory data in the context of their chronic comorbidities.   Reyne Dumas MD [facility] Pager [phone]  If 7PM-7AM, please  contact night-coverage [email or web address] Password [email or web address]  04/28/2017, 1:30 PM

## 2017-04-28 NOTE — ED Notes (Signed)
Pt stable and ready for transport to [facility]. Report given to [facility], Therapist, sports.

## 2017-04-29 ENCOUNTER — Inpatient Hospital Stay (HOSPITAL_COMMUNITY): Payer: PPO

## 2017-04-29 DIAGNOSIS — R002 Palpitations: Secondary | ICD-10-CM

## 2017-04-29 DIAGNOSIS — I5041 Acute combined systolic (congestive) and diastolic (congestive) heart failure: Secondary | ICD-10-CM

## 2017-04-29 DIAGNOSIS — E871 Hypo-osmolality and hyponatremia: Secondary | ICD-10-CM

## 2017-04-29 DIAGNOSIS — I517 Cardiomegaly: Secondary | ICD-10-CM

## 2017-04-29 LAB — COMPREHENSIVE METABOLIC PANEL
ALBUMIN: 3.7 g/dL (ref 3.5–5.0)
ALK PHOS: 48 U/L (ref 38–126)
ALT: 17 U/L (ref 14–54)
AST: 26 U/L (ref 15–41)
Anion gap: 11 (ref 5–15)
BUN: 15 mg/dL (ref 6–20)
CALCIUM: 9.6 mg/dL (ref 8.9–10.3)
CHLORIDE: 99 mmol/L — AB (ref 101–111)
CO2: 23 mmol/L (ref 22–32)
CREATININE: 1.17 mg/dL — AB (ref 0.44–1.00)
GFR calc non Af Amer: 43 mL/min — ABNORMAL LOW (ref >60)
GFR, EST AFRICAN AMERICAN: 50 mL/min — AB (ref >60)
GLUCOSE: 104 mg/dL — AB (ref 65–99)
Potassium: 4.4 mmol/L (ref 3.5–5.1)
SODIUM: 133 mmol/L — AB (ref 135–145)
Total Bilirubin: 1 mg/dL (ref 0.3–1.2)
Total Protein: 7.8 g/dL (ref 6.5–8.1)

## 2017-04-29 LAB — CBC
HCT: 37 % (ref 36.0–46.0)
Hemoglobin: 12.8 g/dL (ref 12.0–15.0)
MCH: 33.9 pg (ref 26.0–34.0)
MCHC: 34.6 g/dL (ref 30.0–36.0)
MCV: 97.9 fL (ref 78.0–100.0)
PLATELETS: 348 10*3/uL (ref 150–400)
RBC: 3.78 MIL/uL — ABNORMAL LOW (ref 3.87–5.11)
RDW: 12.4 % (ref 11.5–15.5)
WBC: 10.1 10*3/uL (ref 4.0–10.5)

## 2017-04-29 LAB — BRAIN NATRIURETIC PEPTIDE: B NATRIURETIC PEPTIDE 5: 158 pg/mL — AB (ref 0.0–100.0)

## 2017-04-29 LAB — ECHOCARDIOGRAM COMPLETE
Height: 66 in
Weight: 2398.4 oz

## 2017-04-29 MED ORDER — CLONAZEPAM 0.5 MG PO TABS
0.5000 mg | ORAL_TABLET | Freq: Three times a day (TID) | ORAL | Status: DC | PRN
  Administered 2017-04-29 – 2017-04-30 (×3): 0.5 mg via ORAL
  Filled 2017-04-29 (×3): qty 1

## 2017-04-29 MED ORDER — METOPROLOL TARTRATE 50 MG PO TABS
50.0000 mg | ORAL_TABLET | Freq: Two times a day (BID) | ORAL | Status: DC
  Administered 2017-04-29 – 2017-04-30 (×2): 50 mg via ORAL
  Filled 2017-04-29 (×2): qty 1

## 2017-04-29 NOTE — Progress Notes (Signed)
  Echocardiogram 2D Echocardiogram has been performed.  Tanya Mueller 04/29/2017, 8:40 AM

## 2017-04-29 NOTE — Progress Notes (Signed)
PROGRESS NOTE                                                                                                                                                                                                             Patient Demographics:    Tanya Mueller, is a 82 y.o. female, DOB - [birth year], NTI:[id number]  Admit date - 04/28/2017   Admitting Physician Reyne Dumas, MD  Outpatient Primary MD for the patient is Sasser, Silvestre Moment, MD  LOS - 1  Outpatient Specialists:None  Chief Complaint  Patient presents with  . Palpitations       Brief Narrative   82 year old female with history of PACs/A. fib (in 2013 with acute illness which converted with Cardizem and no further issues), history of kidney stones who presented to the ED with intermittent palpitations which have worsened and associated dyspnea on exertion for past 2 weeks. She reported having some URI symptoms for 2 weeks and was taking some over-the-counter sinus medications (mainly Mucinex). She denied any chest pain, dizziness, lightheadedness, syncope, abdominal pain, leg edema, nausea, vomiting, bowel urinary symptoms. In the ED blood pressure was mildly elevated, EKG shows sinus rhythm with PACs. Blood work shows sodium of 128, potassium 3.5, BNP in 2000 and normal renal function. CT angiogram of the chest was negative for PE, showed bilateral lower lobe atelectasis.   Subjective:   Patient reports her palpitations to be better. Noted for multiple PACs on the heart monitor.   Assessment  & Plan :    Principal Problem: Palpitations Multiple PACs on the cardiac monitor. Did not see any A. fib or flutter was on a monitor and EKG. TSH and free T4 are normal. Patient should benefit from outpatient 30 day Holter monitoring. Switch atenolol to metoprolol. Symptoms are making her anxious. Added Klonopin as needed.     Acute combined systolic and diastolic congestive  heart failure (HCC) Symptoms better. Being diuresed with IV Lasix. Monitor strict I/O and daily weight. Check 2-D echo. Switch atenolol to metoprolol.[name]  add baby aspirin.  Hypokalemia/hypomagnesemia Replenished  Hyponatremia Possibly due to HCTZ which is being held  URI Continue Flonase and nasal spray ED if symptoms better  Elevated d-dimer CT angiogram of the chest in the ED was negative for PE.  Code Status : Full code  Family Communication  : Husband and  daughter at bedside  Disposition Plan  : Home possibly tomorrow if heart rate stable  Barriers For Discharge : Active symptoms  Consults  :  None  Procedures  : 2-D echo  DVT Prophylaxis  :  Lovenox -  Lab Results  Component Value Date   PLT 348 04/29/2017    Antibiotics  :    Anti-infectives    None        Objective:   Vitals:   04/29/17 0526 04/29/17 0637 04/29/17 0738 04/29/17 0747  BP: (!) 104/44 (!) 108/56    Pulse: 65 68    Resp: 16 17    Temp: 98.7 F (37.1 C) 98.4 F (36.9 C)    TempSrc: Oral Oral    SpO2: 98% 97%  94%  Weight:   68 kg (149 lb 14.4 oz)   Height:        Wt Readings from Last 3 Encounters:  04/29/17 68 kg (149 lb 14.4 oz)  09/15/14 71.7 kg (158 lb)  08/09/14 73.3 kg (161 lb 9.6 oz)     Intake/Output Summary (Last 24 hours) at 04/29/17 1115 Last data filed at 04/29/17 0900  Gross per 24 hour  Intake              700 ml  Output              700 ml  Net                0 ml     Physical Exam  Gen: not in distress HEENT:  moist mucosa, supple neck Chest: clear b/l, no added sounds CVS: Son and S2 irregular, no murmurs rub or gallop GI: soft, NT, ND,  Musculoskeletal: warm, no edema     Data Review:    CBC  Recent Labs Lab 04/28/17 1050 04/29/17 0613  WBC 10.5 10.1  HGB 13.4 12.8  HCT 38.8 37.0  PLT 334 348  MCV 97.2 97.9  MCH 33.6 33.9  MCHC 34.5 34.6  RDW 12.2 12.4  LYMPHSABS 1.8  --   MONOABS 1.2*  --   EOSABS 0.1  --   BASOSABS 0.1  --      Chemistries   Recent Labs Lab 04/28/17 1050 04/29/17 0613  NA 128* 133*  K 3.5 4.4  CL 93* 99*  CO2 24 23  GLUCOSE 115* 104*  BUN 10 15  CREATININE 0.74 1.17*  CALCIUM 9.8 9.6  MG 1.7  --   AST  --  26  ALT  --  17  ALKPHOS  --  48  BILITOT  --  1.0   ------------------------------------------------------------------------------------------------------------------ No results for input(s): CHOL, HDL, LDLCALC, TRIG, CHOLHDL, LDLDIRECT in the last 72 hours.  No results found for: HGBA1C ------------------------------------------------------------------------------------------------------------------  Recent Labs  04/28/17 1050  TSH 1.406   ------------------------------------------------------------------------------------------------------------------ No results for input(s): VITAMINB12, FOLATE, FERRITIN, TIBC, IRON, RETICCTPCT in the last 72 hours.  Coagulation profile No results for input(s): INR, PROTIME in the last 168 hours.   Recent Labs  04/28/17 1050  DDIMER 0.91*    Cardiac Enzymes  Recent Labs Lab 04/28/17 1050  TROPONINI <0.03   ------------------------------------------------------------------------------------------------------------------    Component Value Date/Time   BNP 158.0 (H) 04/29/2017 8676    Inpatient Medications  Scheduled Meds: . atenolol  50 mg Oral BID  . enoxaparin (LOVENOX) injection  40 mg Subcutaneous Q24H  . fluticasone  1 spray Each Nare Daily  . furosemide  40 mg Intravenous Q12H  . levalbuterol  0.63 mg Nebulization Q6H  . magnesium oxide  400 mg Oral Daily   Continuous Infusions: PRN Meds:.acetaminophen **OR** acetaminophen, clonazePAM, ondansetron **OR** ondansetron (ZOFRAN) IV, oxyCODONE-acetaminophen, sodium chloride  Micro Results No results found for this or any previous visit (from the past 240 hour(s)).  Radiology Reports Dg Chest 2 View  Result Date: 04/28/2017 CLINICAL DATA:  Tachycardia.   Palpitations. EXAM: CHEST  2 VIEW COMPARISON:  Chest radiograph 02/13/2012. FINDINGS: Monitoring leads overlie the patient. Stable cardiomegaly and tortuosity of the thoracic aorta. Emphysematous change. Pulmonary vascular redistribution. Pulmonary hyperinflation. Thoracic spine degenerative changes. No definite pleural effusion or pneumothorax. IMPRESSION: Cardiomegaly and pulmonary vascular redistribution. Cannot exclude mild interstitial edema. Pulmonary hyperinflation. Electronically Signed   By: [name] Newcomer M.D.   On: 04/28/2017 11:16   Ct Angio Chest Pe W/cm &/or Wo Cm  Result Date: 04/28/2017 CLINICAL DATA:  Heart palpitations.  Elevated D-dimer EXAM: CT ANGIOGRAPHY CHEST WITH CONTRAST TECHNIQUE: Multidetector CT imaging of the chest was performed using the standard protocol during bolus administration of intravenous contrast. Multiplanar CT image reconstructions and MIPs were obtained to evaluate the vascular anatomy. CONTRAST:  70 cc Isovue 370 IV COMPARISON:  Chest x-ray earlier today FINDINGS: Cardiovascular: No filling defects in the pulmonary arteries to suggest pulmonary emboli. Heart is upper limits normal in size. Aorta is normal caliber. Scattered aortic calcifications. Mediastinum/Nodes: No mediastinal, hilar, or axillary adenopathy. Lungs/Pleura: Areas of atelectasis in the lower lobes bilaterally. No effusions. Upper Abdomen: Imaging into the upper abdomen shows no acute findings. Musculoskeletal: Chest wall soft tissues are unremarkable. No acute bony abnormality. Review of the MIP images confirms the above findings. IMPRESSION: No evidence of pulmonary embolus. Bilateral lower lobe atelectasis. No acute findings. Aortic Atherosclerosis (ICD10-I70.0). Electronically Signed   By: Rolm Baptise M.D.   On: 04/28/2017 12:43    Time Spent in minutes  25   Louellen Molder M.D on 04/29/2017 at 11:15 AM  Between 7am to 7pm - Pager - [phone]  After 7pm go to [email or web address] - password  [facility]  [facility] -  Office  [phone]

## 2017-04-30 DIAGNOSIS — E871 Hypo-osmolality and hyponatremia: Secondary | ICD-10-CM | POA: Diagnosis present

## 2017-04-30 DIAGNOSIS — E876 Hypokalemia: Secondary | ICD-10-CM

## 2017-04-30 DIAGNOSIS — I493 Ventricular premature depolarization: Secondary | ICD-10-CM

## 2017-04-30 LAB — BASIC METABOLIC PANEL
ANION GAP: 11 (ref 5–15)
BUN: 20 mg/dL (ref 6–20)
CHLORIDE: 97 mmol/L — AB (ref 101–111)
CO2: 23 mmol/L (ref 22–32)
Calcium: 9.5 mg/dL (ref 8.9–10.3)
Creatinine, Ser: 1.1 mg/dL — ABNORMAL HIGH (ref 0.44–1.00)
GFR calc non Af Amer: 46 mL/min — ABNORMAL LOW (ref >60)
GFR, EST AFRICAN AMERICAN: 53 mL/min — AB (ref >60)
Glucose, Bld: 118 mg/dL — ABNORMAL HIGH (ref 65–99)
POTASSIUM: 4.1 mmol/L (ref 3.5–5.1)
SODIUM: 131 mmol/L — AB (ref 135–145)

## 2017-04-30 MED ORDER — LEVALBUTEROL HCL 0.63 MG/3ML IN NEBU
0.6300 mg | INHALATION_SOLUTION | Freq: Four times a day (QID) | RESPIRATORY_TRACT | Status: DC
  Administered 2017-04-30: 0.63 mg via RESPIRATORY_TRACT
  Filled 2017-04-30: qty 3

## 2017-04-30 MED ORDER — SALINE SPRAY 0.65 % NA SOLN: 1.0000 | NASAL | 0 refills | Status: DC | PRN

## 2017-04-30 MED ORDER — METOPROLOL TARTRATE 50 MG PO TABS: 50.0000 mg | ORAL_TABLET | Freq: Two times a day (BID) | ORAL | 0 refills | Status: DC

## 2017-04-30 MED ORDER — CLONAZEPAM 0.5 MG PO TABS: 0.5000 mg | ORAL_TABLET | Freq: Two times a day (BID) | ORAL | 0 refills | Status: DC | PRN

## 2017-04-30 NOTE — Progress Notes (Signed)
Discharge instructions and prescriptions given, verbalized understanding, out in stable condition via w/c with staff. 

## 2017-04-30 NOTE — Progress Notes (Signed)
Changed neb to while awake.

## 2017-04-30 NOTE — Discharge Instructions (Signed)
Palpitations A palpitation is the feeling that your heart:  Has an uneven (irregular) heartbeat.  Is beating faster than normal.  Is fluttering.  Is skipping a beat.  This is usually not a serious problem. In some cases, you may need more medical tests. Follow these instructions at home:  Avoid: ? Caffeine in coffee, tea, soft drinks, diet pills, and energy drinks. ? Chocolate. ? Alcohol.  Do not use any tobacco products. These include cigarettes, chewing tobacco, and e-cigarettes. If you need help quitting, ask your doctor.  Try to reduce your stress. These things may help: ? Yoga. ? Meditation. ? Physical activity. Swimming, jogging, and walking are good choices. ? A method that helps you use your mind to control things in your body, like heartbeats (biofeedback).  Get plenty of rest and sleep.  Take over-the-counter and prescription medicines only as told by your doctor.  Keep all follow-up visits as told by your doctor. This is important. Contact a doctor if:  Your heartbeat is still fast or uneven after 24 hours.  Your palpitations occur more often. Get help right away if:  You have chest pain.  You feel short of breath.  You have a very bad headache.  You feel dizzy.  You pass out (faint). This information is not intended to replace advice given to you by your health care provider. Make sure you discuss any questions you have with your health care provider. Document Released: 06/27/2008 Document Revised: 02/24/2016 Document Reviewed: 06/03/2015 Elsevier Interactive Patient Education  2018 Elsevier Inc.  

## 2017-04-30 NOTE — Care Management Note (Signed)
Case Management Note  Patient Details  Name: Tanya Mueller MRN: [id number] Date of Birth: 07-Oct-1934  Subjective/Objective:                  Admitted with CHF. Pt is from home. She lives with her husband, she has a daughter for support. Pt is ind with ADL's. She has PCP, transportation and insurance with drug coverage. She has no HH or DME needs pta. She has a scale and uses it often. She follows with her cardiologist regularly. Pt communicates no needs.   Action/Plan: Discharge home today with self care.   Expected Discharge Date:  04/30/17               Expected Discharge Plan:  Home/Self Care  In-House Referral:  NA  Discharge planning Services  CM Consult  Post Acute Care Choice:  NA Choice offered to:  NA  Status of Service:  Completed, signed off Sherald Barge, RN 04/30/2017, 1:13 PM

## 2017-04-30 NOTE — Discharge Summary (Signed)
Physician Discharge Summary  Tanya Mueller KGM:[id number] DOB: [birth year] DOA: 04/28/2017  PCP: Manon Hilding, MD  Admit date: 04/28/2017 Discharge date: 04/30/2017  Admitted From: Home Disposition: Home  Recommendations for Outpatient Follow-up:  1. Follow up with PCP in 1-2 weeks 2. Cardiology will arrange outpatient follow-up and Holter monitoring.  Home Health: None Equipment/Devices: None  Discharge Condition: Fair CODE STATUS: Full code Diet recommendation: Heart Healthy    Discharge Diagnoses:   Principal problem Palpitations   Active Problems:   Ureteral calculus   Shortness of breath   Hypokalemia   Hypomagnesemia   Hyponatremia   PVC's (premature ventricular contractions)  Brief narrative/history of present illness Reason for to admission H&P for details, in brief,82 year old female with history of PACs/A. fib (in 2013 with acute illness which converted with Cardizem and no further issues), history of kidney stones who presented to the ED with intermittent palpitations which have worsened and associated dyspnea on exertion for past 2 weeks. She reported having some URI symptoms for 2 weeks and was taking some over-the-counter sinus medications (mainly Mucinex). She denied any chest pain, dizziness, lightheadedness, syncope, abdominal pain, leg edema, nausea, vomiting, bowel urinary symptoms. In the ED blood pressure was mildly elevated, EKG shows sinus rhythm with PACs. Blood work shows sodium of 128, potassium 3.5, BNP in 2000 and normal renal function. CT angiogram of the chest was negative for PE, showed bilateral lower lobe atelectasis.  Hospital course  Principal Problem: Palpitations Multiple PACs on the cardiac monitor. Did not have any A. fib on the monitor or on EKG. Switched atenolol to metoprolol twice daily. Added Klonopin given anxiety symptoms with the palpitations. 2-D echo with normal EF, no wall motion abnormality or diastolic dysfunction. No  valvular abnormality except for tubular mitral and tricuspid regurgitation. Mild LVH I have discussed with cardiology who will arrange outpatient Holter monitoring.  Patient symptoms are much better today and can be discharged home with outpatient PCP and cardiology follow-up.   Patient had elevated BNP on presentation and some shortness of breath however workup in the hospital did not show clinical signs or symptoms of CHF. Repeat BNP was normal. echo was also normal ruling out the possibility for acute CHF.     Hyponatremia/hypokalemia/hypomagnesemia Replenished. Discontinued HCTZ.  Essential hypertension Continue losartan. Switch atenolol to metoprolol. Discontinued HCTZ. Blood pressure stable.  URI Improved with nasal spray.  Elevated d-dimer CT angiogram of the chest in the ED was negative for PE.    Family Communication  : Husband and daughter at bedside  Disposition Plan  : Home  Consults  :  None  Procedures  : 2-D echo   Discharge Instructions   Allergies as of 04/30/2017   No Known Allergies     Medication List    STOP taking these medications   atenolol 50 MG tablet Commonly known as:  TENORMIN   hydrochlorothiazide 25 MG tablet Commonly known as:  HYDRODIURIL     TAKE these medications   clonazePAM 0.5 MG tablet Commonly known as:  KLONOPIN Take 1 tablet (0.5 mg total) by mouth 2 (two) times daily as needed (anxiety).   fluticasone 50 MCG/ACT nasal spray Commonly known as:  FLONASE Place 2 sprays into the nose daily as needed for allergies.   losartan 50 MG tablet Commonly known as:  COZAAR Take 50 mg by mouth daily.   METAMUCIL PO Take by mouth as needed.   methenamine 1 g tablet Commonly known as:  HIPREX 2 (two) times  daily with a meal. Take 1/2 tablet twice a day.   metoprolol tartrate 50 MG tablet Commonly known as:  LOPRESSOR Take 1 tablet (50 mg total) by mouth 2 (two) times daily.   oxyCODONE-acetaminophen 7.5-325  MG tablet Commonly known as:  PERCOCET Take 1 tablet by mouth every 6 (six) hours as needed for severe pain.   sodium chloride 0.65 % Soln nasal spray Commonly known as:  OCEAN Place 1 spray into both nostrils as needed for congestion.   trimethoprim 100 MG tablet Commonly known as:  TRIMPEX Take 100 mg by mouth at bedtime.      Follow-up Information    Arnoldo Lenis, MD Follow up on 05/31/2017.   Specialty:  Cardiology Why:  10:20 Contact information: [address] [place] [address] [phone]        Manon Hilding, MD. Schedule an appointment as soon as possible for a visit in 1 week(s).   Specialty:  Family Medicine Contact information: [place] [address] [phone]          No Known Allergies    Procedures/Studies: Dg Chest 2 View  Result Date: 04/28/2017 CLINICAL DATA:  Tachycardia.  Palpitations. EXAM: CHEST  2 VIEW COMPARISON:  Chest radiograph 02/13/2012. FINDINGS: Monitoring leads overlie the patient. Stable cardiomegaly and tortuosity of the thoracic aorta. Emphysematous change. Pulmonary vascular redistribution. Pulmonary hyperinflation. Thoracic spine degenerative changes. No definite pleural effusion or pneumothorax. IMPRESSION: Cardiomegaly and pulmonary vascular redistribution. Cannot exclude mild interstitial edema. Pulmonary hyperinflation. Electronically Signed   By: [name] Newcomer M.D.   On: 04/28/2017 11:16   Ct Angio Chest Pe W/cm &/or Wo Cm  Result Date: 04/28/2017 CLINICAL DATA:  Heart palpitations.  Elevated D-dimer EXAM: CT ANGIOGRAPHY CHEST WITH CONTRAST TECHNIQUE: Multidetector CT imaging of the chest was performed using the standard protocol during bolus administration of intravenous contrast. Multiplanar CT image reconstructions and MIPs were obtained to evaluate the vascular anatomy. CONTRAST:  70 cc Isovue 370 IV COMPARISON:  Chest x-ray earlier today FINDINGS: Cardiovascular: No filling defects in the pulmonary  arteries to suggest pulmonary emboli. Heart is upper limits normal in size. Aorta is normal caliber. Scattered aortic calcifications. Mediastinum/Nodes: No mediastinal, hilar, or axillary adenopathy. Lungs/Pleura: Areas of atelectasis in the lower lobes bilaterally. No effusions. Upper Abdomen: Imaging into the upper abdomen shows no acute findings. Musculoskeletal: Chest wall soft tissues are unremarkable. No acute bony abnormality. Review of the MIP images confirms the above findings. IMPRESSION: No evidence of pulmonary embolus. Bilateral lower lobe atelectasis. No acute findings. Aortic Atherosclerosis (ICD10-I70.0). Electronically Signed   By: Rolm Baptise M.D.   On: 04/28/2017 12:43    2-D echo Study Conclusions  - Left ventricle: The cavity size was normal. Wall thickness was   increased in a pattern of mild LVH. Systolic function was normal.   The estimated ejection fraction was in the range of 55% to 60%.   Wall motion was normal; there were no regional wall motion   abnormalities. Left ventricular diastolic function parameters   were normal for the patient&'s age. - Aortic valve: Mildly calcified annulus. Trileaflet. - Mitral valve: Calcified annulus. There was trivial regurgitation. - Right atrium: Central venous pressure (est): 3 mm Hg. - Atrial septum: No defect or patent foramen ovale was identified. - Tricuspid valve: There was trivial regurgitation. - Pulmonary arteries: PA peak pressure: 26 mm Hg (S). - Pericardium, extracardiac: There was no pericardial effusion.  Impressions:  - Mild LVH with LVEF  55-60%. Grossly normal diastolic function.   Mild calcified mitral annulus with trivial mitral regurgitation.   Mild calcified aortic annulus. Trivial tricuspid regurgitation   with PASP 26 mmHg.   Subjective: Still having some palpitations but much improved. Denies any shortness of breath.  Discharge Exam: Vitals:   04/29/17 2231 04/30/17 0500  BP: (!) 142/56 (!)  118/58  Pulse: 62 67  Resp: 16 16  Temp: 98.4 F (36.9 C) (!) 97.5 F (36.4 C)   Vitals:   04/29/17 2231 04/30/17 0500 04/30/17 0550 04/30/17 0732  BP: (!) 142/56 (!) 118/58    Pulse: 62 67    Resp: 16 16    Temp: 98.4 F (36.9 C) (!) 97.5 F (36.4 C)    TempSrc: Oral Oral    SpO2: 100% 100%  96%  Weight:   70.4 kg (155 lb 3.2 oz)   Height:        Gen: not in distress HEENT:  moist mucosa, supple neck Chest: clear b/l, no added sounds CVS: Son and S2 regular, no murmurs rub or gallop GI: soft, NT, ND,  Musculoskeletal: warm, no edema    The results of significant diagnostics from this hospitalization (including imaging, microbiology, ancillary and laboratory) are listed below for reference.     Microbiology: No results found for this or any previous visit (from the past 240 hour(s)).   Labs: BNP (last 3 results)  Recent Labs  04/28/17 1050 04/29/17 0613  BNP 2,049.0* 417.4*   Basic Metabolic Panel:  Recent Labs Lab 04/28/17 1050 04/29/17 0613 04/30/17 0611  NA 128* 133* 131*  K 3.5 4.4 4.1  CL 93* 99* 97*  CO2 24 23 23   GLUCOSE 115* 104* 118*  BUN 10 15 20   CREATININE 0.74 1.17* 1.10*  CALCIUM 9.8 9.6 9.5  MG 1.7  --   --    Liver Function Tests:  Recent Labs Lab 04/29/17 0613  AST 26  ALT 17  ALKPHOS 48  BILITOT 1.0  PROT 7.8  ALBUMIN 3.7   No results for input(s): LIPASE, AMYLASE in the last 168 hours. No results for input(s): AMMONIA in the last 168 hours. CBC:  Recent Labs Lab 04/28/17 1050 04/29/17 0613  WBC 10.5 10.1  NEUTROABS 7.3  --   HGB 13.4 12.8  HCT 38.8 37.0  MCV 97.2 97.9  PLT 334 348   Cardiac Enzymes:  Recent Labs Lab 04/28/17 1050  TROPONINI <0.03   BNP: Invalid input(s): POCBNP CBG: No results for input(s): GLUCAP in the last 168 hours. D-Dimer  Recent Labs  04/28/17 1050  DDIMER 0.91*   Hgb A1c No results for input(s): HGBA1C in the last 72 hours. Lipid Profile No results for input(s):  CHOL, HDL, LDLCALC, TRIG, CHOLHDL, LDLDIRECT in the last 72 hours. Thyroid function studies  Recent Labs  04/28/17 1050  TSH 1.406   Anemia work up No results for input(s): VITAMINB12, FOLATE, FERRITIN, TIBC, IRON, RETICCTPCT in the last 72 hours. Urinalysis    Component Value Date/Time   COLORURINE STRAW (A) 04/28/2017 1040   APPEARANCEUR CLEAR 04/28/2017 1040   LABSPEC 1.005 04/28/2017 1040   PHURINE 7.0 04/28/2017 1040   GLUCOSEU NEGATIVE 04/28/2017 1040   HGBUR NEGATIVE 04/28/2017 1040   BILIRUBINUR NEGATIVE 04/28/2017 1040   KETONESUR NEGATIVE 04/28/2017 1040   PROTEINUR NEGATIVE 04/28/2017 1040   UROBILINOGEN 0.2 08/09/2014 1230   NITRITE NEGATIVE 04/28/2017 1040   LEUKOCYTESUR NEGATIVE 04/28/2017 1040   Sepsis Labs Invalid input(s): PROCALCITONIN,  WBC,  LACTICIDVEN  Microbiology No results found for this or any previous visit (from the past 240 hour(s)).   Time coordinating discharge: Over 30 minutes  SIGNED:   Louellen Molder, MD  [facility] 04/30/2017, 10:49 AM Pager   If 7PM-7AM, please contact night-coverage [email or web address] Password [email or web address]

## 2017-04-30 NOTE — Care Management Important Message (Signed)
Important Message  Patient Details  Name: Tanya Mueller MRN: [id number] Date of Birth: 07/27/1935   Medicare Important Message Given:  Yes    Sherald Barge, RN 04/30/2017, 1:11 PM

## 2017-05-10 ENCOUNTER — Encounter: Payer: Self-pay | Admitting: *Deleted

## 2017-05-11 ENCOUNTER — Encounter: Payer: Self-pay | Admitting: Cardiovascular Disease

## 2017-05-11 ENCOUNTER — Ambulatory Visit (INDEPENDENT_AMBULATORY_CARE_PROVIDER_SITE_OTHER): Payer: PPO | Admitting: Cardiovascular Disease

## 2017-05-11 VITALS — BP 160/90 | HR 60 | Ht 66.0 in | Wt 154.6 lb

## 2017-05-11 DIAGNOSIS — R002 Palpitations: Secondary | ICD-10-CM

## 2017-05-11 DIAGNOSIS — I10 Essential (primary) hypertension: Secondary | ICD-10-CM | POA: Diagnosis not present

## 2017-05-11 DIAGNOSIS — Z9289 Personal history of other medical treatment: Secondary | ICD-10-CM | POA: Diagnosis not present

## 2017-05-11 MED ORDER — MAGNESIUM OXIDE -MG SUPPLEMENT 400 (240 MG) MG PO TABS: 400.0000 mg | ORAL_TABLET | Freq: Every day | ORAL | 3 refills | Status: DC

## 2017-05-11 MED ORDER — LOSARTAN POTASSIUM 50 MG PO TABS: 75.0000 mg | ORAL_TABLET | Freq: Every day | ORAL | 3 refills | Status: DC

## 2017-05-11 MED ORDER — METOPROLOL TARTRATE 25 MG PO TABS: 25.0000 mg | ORAL_TABLET | Freq: Two times a day (BID) | ORAL | 3 refills | Status: DC

## 2017-05-11 NOTE — Patient Instructions (Signed)
Medication Instructions:  DECREASE METOPROLOL TO 25 MG TWO TIMES DAILY  INCREASE LOSARTAN TO 75 MG DAILY  START MAGNESIUM OXIDE 400 MG DAILY   Labwork: 1 WEEK  MAGNESIUM  Testing/Procedures: Your physician has recommended that you wear a holter monitor. Holter monitors are medical devices that record the heart's electrical activity. Doctors most often use these monitors to diagnose arrhythmias. Arrhythmias are problems with the speed or rhythm of the heartbeat. The monitor is a small, portable device. You can wear one while you do your normal daily activities. This is usually used to diagnose what is causing palpitations/syncope (passing out). 48 HRS.   Follow-Up: Your physician recommends that you schedule a follow-up appointment in: 1 MONTH    Any Other Special Instructions Will Be Listed Below (If Applicable).     If you need a refill on your cardiac medications before your next appointment, please call your pharmacy.

## 2017-05-11 NOTE — Progress Notes (Signed)
CARDIOLOGY CONSULT NOTE  Patient ID: Tanya Mueller MRN: [id number] DOB/AGE: 80-Aug-1936 80 y.o.  Admit date: (Not on file) Primary Physician: Manon Hilding, MD Referring Physician: Quintin Alto  Reason for Consultation: palpitations  HPI: Tanya Mueller is a 82 y.o. female who is being seen today for the evaluation of palpitations at the request of Sasser, Silvestre Moment, MD.   She has a history of hypertension. She was recently hospitalized for palpitations and telemetry demonstrated multiple PACs but no evidence of atrial fibrillation. She was switched from atenolol to metoprolol.  Echocardiogram 04/29/17 demonstrated normal left ventricular systolic function, LVEF 76-22%, mild LVH, normal diastolic function.  Chest x-ray showed cardiomegaly and pulmonary vascular redistribution. CT angiography of the chest showed no evidence of pulmonary embolism with bilateral lower lobe atelectasis.  Relevant labs: BUN 20, creatinine 1.1, GFR 46 mL/m, BNP 158, hemoglobin 12.8, normal TSH of 1.4, magnesium low normal at 1.7.  I personally reviewed the ECG which demonstrated sinus rhythm with frequent PACs.  She reportedly had a very brief episode of atrial fibrillation while hospitalized for nephrolithiasis and bacteremia and sepsis in 2013. She had an episode of rapid atrial fibrillation with conversion to sinus rhythm with diltiazem.  She feels that her palpitations are better controlled with metoprolol. However she has felt much more fatigued in the afternoons and is now frequently napping. Her blood pressures are consistently elevated with systolic readings always greater than 140. In our office today it is 160/90.   She is here with her husband whom I met yesterday as a new patient. They have been married for 62 years.       No Known Allergies  Current Outpatient Prescriptions  Medication Sig Dispense Refill  . clonazePAM (KLONOPIN) 0.5 MG tablet Take 1 tablet (0.5 mg total) by mouth 2 (two)  times daily as needed (anxiety). 20 tablet 0  . fluticasone (FLONASE) 50 MCG/ACT nasal spray Place 2 sprays into the nose daily as needed for allergies.     [name] losartan (COZAAR) 50 MG tablet Take 50 mg by mouth daily.    . methenamine (HIPREX) 1 g tablet Take 0.5 g by mouth 2 (two) times daily with a meal. Take 1/2 tablet twice a day.     . metoprolol tartrate (LOPRESSOR) 50 MG tablet Take 1 tablet (50 mg total) by mouth 2 (two) times daily. 60 tablet 0  . oxyCODONE-acetaminophen (PERCOCET) 7.5-325 MG tablet Take 1 tablet by mouth every 6 (six) hours as needed for severe pain.    . sodium chloride (OCEAN) 0.65 % SOLN nasal spray Place 1 spray into both nostrils as needed for congestion. 1 Bottle 0  . trimethoprim (TRIMPEX) 100 MG tablet Take 100 mg by mouth at bedtime.     No current facility-administered medications for this visit.     Past Medical History:  Diagnosis Date  . Allergic rhinitis   . Frequency of urination   . History of atrial fibrillation    episode 2013 converted with cardizem-- happened at time acute illness due to kidney stone --  resolved  no issues since  . History of kidney stones   . History of sepsis    urosepsis  2013 due to kidney stone  . Intermittent palpitations    controlled w/ atenolol  . Left nephrolithiasis   . Left ureteral calculus   . PAC (premature atrial contraction)   . Palpitations   . Solitary lung nodule    benign right  middle lobe per CT  . Urgency of urination   . Wears dentures    upper  . Wears partial dentures    lower    Past Surgical History:  Procedure Laterality Date  . CATARACT EXTRACTION W/ INTRAOCULAR LENS  IMPLANT, BILATERAL  2010  . CYSTOSCOPY WITH STENT PLACEMENT Left 09/15/2014   Procedure: CYSTOSCOPY WITH STENT PLACEMENT;  Surgeon: Georgette Dover, MD;  Location: [facility];  Service: Urology;  Laterality: Left;  . CYSTOSCOPY/RETROGRADE/URETEROSCOPY Left 08/09/2014   Procedure:  CYSTOSCOPY/RETROGRADE/LEFT STENT;  Surgeon: Bernestine Amass, MD;  Location: [facility];  Service: Urology;  Laterality: Left;  . EXTRACORPOREAL SHOCK WAVE LITHOTRIPSY  2013  . HOLMIUM LASER APPLICATION Left 16/07/9603   Procedure: HOLMIUM LASER APPLICATION;  Surgeon: Georgette Dover, MD;  Location: [facility];  Service: Urology;  Laterality: Left;  . LAPAROTOMY W/  UNILATERAL SALPINGOOPHORECTOMY  age 64  . TOTAL ABDOMINAL HYSTERECTOMY  age 27   w/ unilateral salpingoophorectomy  . TRANSTHORACIC ECHOCARDIOGRAM  02-13-2012   mild LVH/  ef 65%/  trivial MR  &  PR/  mild RVE  . URETEROSCOPY Left 09/15/2014   Procedure: URETEROSCOPY;  Surgeon: Georgette Dover, MD;  Location: [facility];  Service: Urology;  Laterality: Left;    Social History   Social History  . Marital status: Married    Spouse name: N/A  . Number of children: 1  . Years of education: N/A   Occupational History  .  Retired   Social History Main Topics  . Smoking status: Never Smoker  . Smokeless tobacco: Never Used  . Alcohol use No  . Drug use: No  . Sexual activity: Not on file   Other Topics Concern  . Not on file   Social History Narrative  . No narrative on file     No family history of premature CAD in 1st degree relatives.  Current Meds  Medication Sig  . clonazePAM (KLONOPIN) 0.5 MG tablet Take 1 tablet (0.5 mg total) by mouth 2 (two) times daily as needed (anxiety).  . fluticasone (FLONASE) 50 MCG/ACT nasal spray Place 2 sprays into the nose daily as needed for allergies.   [name] losartan (COZAAR) 50 MG tablet Take 50 mg by mouth daily.  . methenamine (HIPREX) 1 g tablet Take 0.5 g by mouth 2 (two) times daily with a meal. Take 1/2 tablet twice a day.   . metoprolol tartrate (LOPRESSOR) 50 MG tablet Take 1 tablet (50 mg total) by mouth 2 (two) times daily.  [name] oxyCODONE-acetaminophen (PERCOCET) 7.5-325 MG tablet Take 1 tablet by mouth every 6 (six) hours as needed for  severe pain.  . sodium chloride (OCEAN) 0.65 % SOLN nasal spray Place 1 spray into both nostrils as needed for congestion.  [name] trimethoprim (TRIMPEX) 100 MG tablet Take 100 mg by mouth at bedtime.      Review of systems complete and found to be negative unless listed above in HPI    Physical exam Blood pressure (!) 160/90, pulse 60, height '5\' 6"'$  (1.676 m), weight 154 lb 9.6 oz (70.1 kg), SpO2 96 %. General: NAD Neck: No JVD, no thyromegaly or thyroid nodule.  Lungs: Clear to auscultation bilaterally with normal respiratory effort. CV: Nondisplaced PMI. Regular rate and rhythm, normal S1/S2, no S3/S4, no murmur.  No peripheral edema.  No carotid bruit.    Abdomen: Soft, nontender, no distention.  Skin: Intact without lesions or rashes.  Neurologic: Alert and oriented  x 3.  Psych: Normal affect. Extremities: No clubbing or cyanosis.  HEENT: Normal.   ECG: Most recent ECG reviewed.   Labs: Lab Results  Component Value Date/Time   K 4.1 04/30/2017 06:11 AM   BUN 20 04/30/2017 06:11 AM   CREATININE 1.10 (H) 04/30/2017 06:11 AM   ALT 17 04/29/2017 06:13 AM   TSH 1.406 04/28/2017 10:50 AM   HGB 12.8 04/29/2017 06:13 AM     Lipids: No results found for: LDLCALC, LDLDIRECT, CHOL, TRIG, HDL      ASSESSMENT AND PLAN:  1. Palpitations: While symptoms are better controlled with metoprolol, she is feeling much more fatigued. Heart rate is 60 bpm. I will reduce the dose to 25 mg twice daily. I will obtain a 48 hour Holter monitor. Magnesium was low normal. I will provide 400 mg of magnesium oxide daily and check a magnesium level one week later. I will also aim to control her blood pressure.  2. Hypertension: Systolic blood pressures have consistently been greater than 140. It is elevated in our office today. I will increase losartan to 75 mg daily. I have asked the patient to check blood pressure readings 4-5 times per week, at different times throughout the day, in order to get a  better approximation of mean BP values. These results will be provided to me at the end of that period so that I can determine if antihypertensive medication titration is indicated.  3. Hypomagnesemia: I will aim to normalize magnesium levels to increase arrhythmia threshold.  I will provide 400 mg of magnesium oxide daily and check a serum magnesium level one week later.     Disposition: Follow up in 1 month  Signed: Kate Sable, M.D., F.A.C.C.  05/11/2017, 10:29 AM

## 2017-05-15 ENCOUNTER — Ambulatory Visit (HOSPITAL_COMMUNITY)
Admission: RE | Admit: 2017-05-15 | Discharge: 2017-05-15 | Disposition: A | Discharge status: Home / Self Care | Payer: PPO | Source: Ambulatory Visit | Attending: Cardiology | Admitting: Cardiology

## 2017-05-15 DIAGNOSIS — R002 Palpitations: Secondary | ICD-10-CM

## 2017-05-17 DIAGNOSIS — M419 Scoliosis, unspecified: Secondary | ICD-10-CM | POA: Diagnosis not present

## 2017-05-17 DIAGNOSIS — M4716 Other spondylosis with myelopathy, lumbar region: Secondary | ICD-10-CM | POA: Diagnosis not present

## 2017-05-18 DIAGNOSIS — J019 Acute sinusitis, unspecified: Secondary | ICD-10-CM | POA: Diagnosis not present

## 2017-05-18 DIAGNOSIS — H9201 Otalgia, right ear: Secondary | ICD-10-CM | POA: Diagnosis not present

## 2017-05-18 DIAGNOSIS — Z6826 Body mass index (BMI) 26.0-26.9, adult: Secondary | ICD-10-CM | POA: Diagnosis not present

## 2017-05-21 DIAGNOSIS — R002 Palpitations: Secondary | ICD-10-CM | POA: Diagnosis not present

## 2017-05-22 ENCOUNTER — Telehealth: Payer: Self-pay

## 2017-05-22 LAB — MAGNESIUM: Magnesium: 2 mg/dL (ref 1.5–2.5)

## 2017-05-22 NOTE — Telephone Encounter (Signed)
-----   Message from Herminio Commons, MD sent at 05/22/2017  9:13 AM EDT ----- Frequent extra beats. No worrisome rhythms. Continue current therapy.

## 2017-05-22 NOTE — Telephone Encounter (Signed)
Called pt., no answer. Left message for pt to return call.  

## 2017-05-26 DIAGNOSIS — R002 Palpitations: Secondary | ICD-10-CM | POA: Diagnosis not present

## 2017-05-26 DIAGNOSIS — F411 Generalized anxiety disorder: Secondary | ICD-10-CM | POA: Diagnosis not present

## 2017-05-26 DIAGNOSIS — H9201 Otalgia, right ear: Secondary | ICD-10-CM | POA: Diagnosis not present

## 2017-05-26 DIAGNOSIS — J019 Acute sinusitis, unspecified: Secondary | ICD-10-CM | POA: Diagnosis not present

## 2017-05-26 DIAGNOSIS — Z6825 Body mass index (BMI) 25.0-25.9, adult: Secondary | ICD-10-CM | POA: Diagnosis not present

## 2017-05-30 DIAGNOSIS — E871 Hypo-osmolality and hyponatremia: Secondary | ICD-10-CM | POA: Diagnosis not present

## 2017-05-30 DIAGNOSIS — E876 Hypokalemia: Secondary | ICD-10-CM | POA: Diagnosis not present

## 2017-05-30 DIAGNOSIS — F411 Generalized anxiety disorder: Secondary | ICD-10-CM | POA: Diagnosis not present

## 2017-05-30 DIAGNOSIS — R002 Palpitations: Secondary | ICD-10-CM | POA: Diagnosis not present

## 2017-05-30 DIAGNOSIS — Z6825 Body mass index (BMI) 25.0-25.9, adult: Secondary | ICD-10-CM | POA: Diagnosis not present

## 2017-05-31 ENCOUNTER — Ambulatory Visit: Payer: PPO | Admitting: Cardiology

## 2017-06-08 ENCOUNTER — Encounter: Payer: Self-pay | Admitting: *Deleted

## 2017-06-08 ENCOUNTER — Ambulatory Visit (INDEPENDENT_AMBULATORY_CARE_PROVIDER_SITE_OTHER): Payer: PPO | Admitting: Cardiovascular Disease

## 2017-06-08 VITALS — BP 130/84 | HR 67 | Ht 66.0 in | Wt 156.0 lb

## 2017-06-08 DIAGNOSIS — I10 Essential (primary) hypertension: Secondary | ICD-10-CM

## 2017-06-08 DIAGNOSIS — F411 Generalized anxiety disorder: Secondary | ICD-10-CM

## 2017-06-08 DIAGNOSIS — R002 Palpitations: Secondary | ICD-10-CM

## 2017-06-08 MED ORDER — LOSARTAN POTASSIUM 100 MG PO TABS: 100.0000 mg | ORAL_TABLET | Freq: Every day | ORAL | 3 refills | Status: DC

## 2017-06-08 MED ORDER — PAROXETINE HCL 20 MG PO TABS: 20.0000 mg | ORAL_TABLET | Freq: Every day | ORAL | 1 refills | Status: DC

## 2017-06-08 NOTE — Patient Instructions (Signed)
Medication Instructions:   Increase Losartan to 100mg  daily.  Stop Clonazepam (Klonopin).  Begin Paxil 20mg  daily.  If this works, patient is to discuss further with primary MD for maintenance of this medication.   Continue all other medications.    Labwork: none  Testing/Procedures: none  Follow-Up: Your physician wants you to follow up in: 6 months.  You will receive a reminder letter in the mail one-two months in advance.  If you don't receive a letter, please call our office to schedule the follow up appointment   Any Other Special Instructions Will Be Listed Below (If Applicable).  If you need a refill on your cardiac medications before your next appointment, please call your pharmacy.

## 2017-06-08 NOTE — Progress Notes (Signed)
SUBJECTIVE: The patient returns for follow-up after undergoing cardiovascular testing performed for the evaluation of palpitations.  Holter monitoring demonstrated sinus rhythm with frequent PACs and isolated PVCs with brief atrial runs. Symptoms correlated with PACs.  I started magnesium oxide at her last visit. Magnesium level was 2 on 05/21/17.  She says her palpitations are less forceful. She has had occasional weak spells. They are not associated with chest pain or shortness of breath.  She also tells me that she was prescribed clonazepam for anxiety to be taken as needed. She feels she does need something for anxiety but is not convinced this has been helpful for her.   Review of Systems: As per "subjective", otherwise negative.  No Known Allergies  Current Outpatient Prescriptions  Medication Sig Dispense Refill  . clonazePAM (KLONOPIN) 0.5 MG tablet Take 1 tablet (0.5 mg total) by mouth 2 (two) times daily as needed (anxiety). 20 tablet 0  . fluticasone (FLONASE) 50 MCG/ACT nasal spray Place 2 sprays into the nose daily as needed for allergies.     [name] losartan (COZAAR) 50 MG tablet Take 1.5 tablets (75 mg total) by mouth daily. 135 tablet 3  . Magnesium Oxide 400 (240 Mg) MG TABS Take 1 tablet (400 mg total) by mouth daily. 90 tablet 3  . methenamine (HIPREX) 1 g tablet Take 0.5 g by mouth 2 (two) times daily with a meal. Take 1/2 tablet twice a day.     . metoprolol tartrate (LOPRESSOR) 25 MG tablet Take 1 tablet (25 mg total) by mouth 2 (two) times daily. 180 tablet 3  . oxyCODONE-acetaminophen (PERCOCET) 7.5-325 MG tablet Take 1 tablet by mouth every 6 (six) hours as needed for severe pain.    . sodium chloride (OCEAN) 0.65 % SOLN nasal spray Place 1 spray into both nostrils as needed for congestion. 1 Bottle 0  . trimethoprim (TRIMPEX) 100 MG tablet Take 100 mg by mouth at bedtime.     No current facility-administered medications for this visit.     Past Medical  History:  Diagnosis Date  . Allergic rhinitis   . Frequency of urination   . History of atrial fibrillation    episode 2013 converted with cardizem-- happened at time acute illness due to kidney stone --  resolved  no issues since  . History of kidney stones   . History of sepsis    urosepsis  2013 due to kidney stone  . Intermittent palpitations    controlled w/ atenolol  . Left nephrolithiasis   . Left ureteral calculus   . PAC (premature atrial contraction)   . Palpitations   . Solitary lung nodule    benign right middle lobe per CT  . Urgency of urination   . Wears dentures    upper  . Wears partial dentures    lower    Past Surgical History:  Procedure Laterality Date  . CATARACT EXTRACTION W/ INTRAOCULAR LENS  IMPLANT, BILATERAL  2010  . CYSTOSCOPY WITH STENT PLACEMENT Left 09/15/2014   Procedure: CYSTOSCOPY WITH STENT PLACEMENT;  Surgeon: Georgette Dover, MD;  Location: [facility];  Service: Urology;  Laterality: Left;  . CYSTOSCOPY/RETROGRADE/URETEROSCOPY Left 08/09/2014   Procedure: CYSTOSCOPY/RETROGRADE/LEFT STENT;  Surgeon: Bernestine Amass, MD;  Location: [facility];  Service: Urology;  Laterality: Left;  . EXTRACORPOREAL SHOCK WAVE LITHOTRIPSY  2013  . HOLMIUM LASER APPLICATION Left 81/19/1478   Procedure: HOLMIUM LASER APPLICATION;  Surgeon: Georgette Dover, MD;  Location:  [place];  Service: Urology;  Laterality: Left;  . LAPAROTOMY W/  UNILATERAL SALPINGOOPHORECTOMY  age 73  . TOTAL ABDOMINAL HYSTERECTOMY  age 4   w/ unilateral salpingoophorectomy  . TRANSTHORACIC ECHOCARDIOGRAM  02-13-2012   mild LVH/  ef 65%/  trivial MR  &  PR/  mild RVE  . URETEROSCOPY Left 09/15/2014   Procedure: URETEROSCOPY;  Surgeon: Georgette Dover, MD;  Location: [facility];  Service: Urology;  Laterality: Left;    Social History   Social History  . Marital status: Married    Spouse name: N/A  . Number of children: 1  .  Years of education: N/A   Occupational History  .  Retired   Social History Main Topics  . Smoking status: Never Smoker  . Smokeless tobacco: Never Used  . Alcohol use No  . Drug use: No  . Sexual activity: Not on file   Other Topics Concern  . Not on file   Social History Narrative  . No narrative on file     Vitals:   06/08/17 0958  BP: 130/84  Pulse: 67  SpO2: 97%  Weight: 156 lb (70.8 kg)  Height: 5\' 6"  (1.676 m)    Wt Readings from Last 3 Encounters:  06/08/17 156 lb (70.8 kg)  05/11/17 154 lb 9.6 oz (70.1 kg)  04/30/17 155 lb 3.2 oz (70.4 kg)     PHYSICAL EXAM General: NAD HEENT: Normal. Neck: No JVD, no thyromegaly. Lungs: Clear to auscultation bilaterally with normal respiratory effort. CV: Nondisplaced PMI.  Regular rate and rhythm, normal S1/S2, no S3/S4, no murmur. No pretibial or periankle edema.     Abdomen: Soft, nontender, no distention.  Neurologic: Alert and oriented.  Psych: Normal affect. Skin: Normal. Musculoskeletal: No gross deformities.    ECG: Most recent ECG reviewed.   Labs: Lab Results  Component Value Date/Time   K 4.1 04/30/2017 06:11 AM   BUN 20 04/30/2017 06:11 AM   CREATININE 1.10 (H) 04/30/2017 06:11 AM   ALT 17 04/29/2017 06:13 AM   TSH 1.406 04/28/2017 10:50 AM   HGB 12.8 04/29/2017 06:13 AM     Lipids: No results found for: LDLCALC, LDLDIRECT, CHOL, TRIG, HDL     ASSESSMENT AND PLAN:  1. Palpitations: Holter monitoring demonstrated sinus rhythm with frequent PACs and isolated PVCs with brief atrial runs. Symptoms correlated with PACs. Continue metoprolol 25 mg twice daily and 400 mg of magnesium oxide daily. Magnesium level was 2 on 05/21/17.  2. Hypertension: I reviewed her blood pressure log. She has had several elevated readings. I will increase losartan to 100 mg daily.  3. Hypomagnesemia: Magnesium level was 2 on 05/21/17. Continue Mag oxide.  4. Generalized anxiety disorder: I will discontinue  clonazepam and start paroxetine 20 mg daily. I will give her a two-month supply. I will defer to her PCP whether this should be increased or maintained.      Disposition: Follow up 6 months.   Kate Sable, M.D., F.A.C.C.

## 2017-06-26 DIAGNOSIS — M419 Scoliosis, unspecified: Secondary | ICD-10-CM | POA: Insufficient documentation

## 2017-06-26 DIAGNOSIS — M4716 Other spondylosis with myelopathy, lumbar region: Secondary | ICD-10-CM | POA: Insufficient documentation

## 2017-06-28 DIAGNOSIS — M419 Scoliosis, unspecified: Secondary | ICD-10-CM | POA: Diagnosis not present

## 2017-06-28 DIAGNOSIS — M4716 Other spondylosis with myelopathy, lumbar region: Secondary | ICD-10-CM | POA: Diagnosis not present

## 2017-07-13 DIAGNOSIS — I10 Essential (primary) hypertension: Secondary | ICD-10-CM | POA: Diagnosis not present

## 2017-07-13 DIAGNOSIS — I482 Chronic atrial fibrillation: Secondary | ICD-10-CM | POA: Diagnosis not present

## 2017-07-13 DIAGNOSIS — N183 Chronic kidney disease, stage 3 (moderate): Secondary | ICD-10-CM | POA: Diagnosis not present

## 2017-07-13 DIAGNOSIS — E7800 Pure hypercholesterolemia, unspecified: Secondary | ICD-10-CM | POA: Diagnosis not present

## 2017-07-13 DIAGNOSIS — I480 Paroxysmal atrial fibrillation: Secondary | ICD-10-CM | POA: Diagnosis not present

## 2017-07-13 DIAGNOSIS — E876 Hypokalemia: Secondary | ICD-10-CM | POA: Diagnosis not present

## 2017-07-13 DIAGNOSIS — E871 Hypo-osmolality and hyponatremia: Secondary | ICD-10-CM | POA: Diagnosis not present

## 2017-07-17 DIAGNOSIS — I10 Essential (primary) hypertension: Secondary | ICD-10-CM | POA: Diagnosis not present

## 2017-07-17 DIAGNOSIS — E7800 Pure hypercholesterolemia, unspecified: Secondary | ICD-10-CM | POA: Diagnosis not present

## 2017-07-17 DIAGNOSIS — Z6824 Body mass index (BMI) 24.0-24.9, adult: Secondary | ICD-10-CM | POA: Diagnosis not present

## 2017-07-17 DIAGNOSIS — F411 Generalized anxiety disorder: Secondary | ICD-10-CM | POA: Diagnosis not present

## 2017-07-17 DIAGNOSIS — I480 Paroxysmal atrial fibrillation: Secondary | ICD-10-CM | POA: Diagnosis not present

## 2017-07-17 DIAGNOSIS — Z23 Encounter for immunization: Secondary | ICD-10-CM | POA: Diagnosis not present

## 2017-07-17 DIAGNOSIS — M25552 Pain in left hip: Secondary | ICD-10-CM | POA: Diagnosis not present

## 2017-07-18 DIAGNOSIS — Z79899 Other long term (current) drug therapy: Secondary | ICD-10-CM | POA: Insufficient documentation

## 2017-07-30 DIAGNOSIS — Z79899 Other long term (current) drug therapy: Secondary | ICD-10-CM | POA: Diagnosis not present

## 2017-09-28 DIAGNOSIS — Z79899 Other long term (current) drug therapy: Secondary | ICD-10-CM | POA: Diagnosis not present

## 2017-10-05 DIAGNOSIS — M419 Scoliosis, unspecified: Secondary | ICD-10-CM | POA: Diagnosis not present

## 2017-10-05 DIAGNOSIS — M4716 Other spondylosis with myelopathy, lumbar region: Secondary | ICD-10-CM | POA: Diagnosis not present

## 2017-11-08 DIAGNOSIS — I10 Essential (primary) hypertension: Secondary | ICD-10-CM | POA: Diagnosis not present

## 2017-11-08 DIAGNOSIS — E7800 Pure hypercholesterolemia, unspecified: Secondary | ICD-10-CM | POA: Diagnosis not present

## 2017-11-08 DIAGNOSIS — E871 Hypo-osmolality and hyponatremia: Secondary | ICD-10-CM | POA: Diagnosis not present

## 2017-11-08 DIAGNOSIS — E876 Hypokalemia: Secondary | ICD-10-CM | POA: Diagnosis not present

## 2017-11-14 DIAGNOSIS — M25552 Pain in left hip: Secondary | ICD-10-CM | POA: Diagnosis not present

## 2017-11-14 DIAGNOSIS — I10 Essential (primary) hypertension: Secondary | ICD-10-CM | POA: Diagnosis not present

## 2017-11-14 DIAGNOSIS — Z6824 Body mass index (BMI) 24.0-24.9, adult: Secondary | ICD-10-CM | POA: Diagnosis not present

## 2017-11-14 DIAGNOSIS — F411 Generalized anxiety disorder: Secondary | ICD-10-CM | POA: Diagnosis not present

## 2017-11-14 DIAGNOSIS — E7800 Pure hypercholesterolemia, unspecified: Secondary | ICD-10-CM | POA: Diagnosis not present

## 2017-11-14 DIAGNOSIS — I480 Paroxysmal atrial fibrillation: Secondary | ICD-10-CM | POA: Diagnosis not present

## 2017-11-14 DIAGNOSIS — Z0001 Encounter for general adult medical examination with abnormal findings: Secondary | ICD-10-CM | POA: Diagnosis not present

## 2017-11-21 DIAGNOSIS — R3915 Urgency of urination: Secondary | ICD-10-CM | POA: Diagnosis not present

## 2017-11-21 DIAGNOSIS — N200 Calculus of kidney: Secondary | ICD-10-CM | POA: Diagnosis not present

## 2017-12-07 ENCOUNTER — Encounter: Payer: Self-pay | Admitting: *Deleted

## 2017-12-07 ENCOUNTER — Ambulatory Visit: Payer: PPO | Admitting: Cardiovascular Disease

## 2017-12-07 VITALS — BP 206/91 | HR 56 | Ht 66.0 in | Wt 147.0 lb

## 2017-12-07 DIAGNOSIS — I10 Essential (primary) hypertension: Secondary | ICD-10-CM

## 2017-12-07 DIAGNOSIS — F411 Generalized anxiety disorder: Secondary | ICD-10-CM | POA: Diagnosis not present

## 2017-12-07 DIAGNOSIS — R002 Palpitations: Secondary | ICD-10-CM | POA: Diagnosis not present

## 2017-12-07 MED ORDER — AMLODIPINE BESYLATE 5 MG PO TABS: 5.0000 mg | ORAL_TABLET | Freq: Every day | ORAL | 6 refills | Status: DC

## 2017-12-07 NOTE — Progress Notes (Signed)
SUBJECTIVE: The patient presents for follow-up of palpitations and hypertension.  Holter monitoring in August 2018 demonstrated sinus rhythm with frequent PACs and isolated PVCs with brief atrial runs. Symptoms correlated with PACs.  She was also found to be hypomagnesemic and I started her on magnesium oxide.  She also has a history of anxiety for which I initially started Paxil.  She feels like the Paxil has really helped her.  She denies chest pain, palpitations, leg swelling, orthopnea, and shortness of breath.  Her blood pressure at home has run in the 170/90 range.       Review of Systems: As per "subjective", otherwise negative.  No Known Allergies  Current Outpatient Medications  Medication Sig Dispense Refill  . fluticasone (FLONASE) 50 MCG/ACT nasal spray Place 2 sprays into the nose daily as needed for allergies.     [name] losartan (COZAAR) 100 MG tablet Take 1 tablet (100 mg total) by mouth daily. 90 tablet 3  . metoprolol tartrate (LOPRESSOR) 25 MG tablet Take 1 tablet (25 mg total) by mouth 2 (two) times daily. 180 tablet 3  . nitrofurantoin (MACRODANTIN) 100 MG capsule Take 1 capsule by mouth 2 (two) times daily.    [name] oxyCODONE-acetaminophen (PERCOCET) 7.5-325 MG tablet Take 1 tablet by mouth every 6 (six) hours as needed for severe pain.    [name] PARoxetine (PAXIL) 20 MG tablet Take 1 tablet (20 mg total) by mouth daily. 30 tablet 1  . sodium chloride (OCEAN) 0.65 % SOLN nasal spray Place 1 spray into both nostrils as needed for congestion. 1 Bottle 0   No current facility-administered medications for this visit.     Past Medical History:  Diagnosis Date  . Allergic rhinitis   . Frequency of urination   . History of atrial fibrillation    episode 2013 converted with cardizem-- happened at time acute illness due to kidney stone --  resolved  no issues since  . History of kidney stones   . History of sepsis    urosepsis  2013 due to kidney stone  .  Intermittent palpitations    controlled w/ atenolol  . Left nephrolithiasis   . Left ureteral calculus   . PAC (premature atrial contraction)   . Palpitations   . Solitary lung nodule    benign right middle lobe per CT  . Urgency of urination   . Wears dentures    upper  . Wears partial dentures    lower    Past Surgical History:  Procedure Laterality Date  . CATARACT EXTRACTION W/ INTRAOCULAR LENS  IMPLANT, BILATERAL  2010  . CYSTOSCOPY WITH STENT PLACEMENT Left 09/15/2014   Procedure: CYSTOSCOPY WITH STENT PLACEMENT;  Surgeon: Georgette Dover, MD;  Location: [facility];  Service: Urology;  Laterality: Left;  . CYSTOSCOPY/RETROGRADE/URETEROSCOPY Left 08/09/2014   Procedure: CYSTOSCOPY/RETROGRADE/LEFT STENT;  Surgeon: Bernestine Amass, MD;  Location: [facility];  Service: Urology;  Laterality: Left;  . EXTRACORPOREAL SHOCK WAVE LITHOTRIPSY  2013  . HOLMIUM LASER APPLICATION Left 47/06/6282   Procedure: HOLMIUM LASER APPLICATION;  Surgeon: Georgette Dover, MD;  Location: [facility];  Service: Urology;  Laterality: Left;  . LAPAROTOMY W/  UNILATERAL SALPINGOOPHORECTOMY  age 44  . TOTAL ABDOMINAL HYSTERECTOMY  age 42   w/ unilateral salpingoophorectomy  . TRANSTHORACIC ECHOCARDIOGRAM  02-13-2012   mild LVH/  ef 65%/  trivial MR  &  PR/  mild RVE  . URETEROSCOPY Left 09/15/2014  Procedure: URETEROSCOPY;  Surgeon: Georgette Dover, MD;  Location: [facility];  Service: Urology;  Laterality: Left;    Social History   Socioeconomic History  . Marital status: Married    Spouse name: Not on file  . Number of children: 1  . Years of education: Not on file  . Highest education level: Not on file  Social Needs  . Financial resource strain: Not on file  . Food insecurity - worry: Not on file  . Food insecurity - inability: Not on file  . Transportation needs - medical: Not on file  . Transportation needs - non-medical: Not on file    Occupational History    Employer: RETIRED  Tobacco Use  . Smoking status: Never Smoker  . Smokeless tobacco: Never Used  Substance and Sexual Activity  . Alcohol use: No  . Drug use: No  . Sexual activity: Not on file  Other Topics Concern  . Not on file  Social History Narrative  . Not on file     Vitals:   12/07/17 1105 12/07/17 1124  BP: (!) 210/75 (!) 206/91  Pulse: (!) 55 (!) 56  Weight: 147 lb (66.7 kg)   Height: 5\' 6"  (1.676 m)     Wt Readings from Last 3 Encounters:  12/07/17 147 lb (66.7 kg)  06/08/17 156 lb (70.8 kg)  05/11/17 154 lb 9.6 oz (70.1 kg)     PHYSICAL EXAM General: NAD HEENT: Normal. Neck: No JVD, no thyromegaly. Lungs: Clear to auscultation bilaterally with normal respiratory effort. CV: Regular rate and rhythm, normal S1/S2, no S3/S4, no murmur. No pretibial or periankle edema.  No carotid bruit.   Abdomen: Soft, nontender, no distention.  Neurologic: Alert and oriented.  Psych: Normal affect. Skin: Normal. Musculoskeletal: No gross deformities.    ECG: Most recent ECG reviewed.   Labs: Lab Results  Component Value Date/Time   K 4.1 04/30/2017 06:11 AM   BUN 20 04/30/2017 06:11 AM   CREATININE 1.10 (H) 04/30/2017 06:11 AM   ALT 17 04/29/2017 06:13 AM   TSH 1.406 04/28/2017 10:50 AM   HGB 12.8 04/29/2017 06:13 AM     Lipids: No results found for: LDLCALC, LDLDIRECT, CHOL, TRIG, HDL     ASSESSMENT AND PLAN: 1. Palpitations:  Symptomatically stable.  Symptoms have also improved with Paxil.  Holter monitoring demonstrated sinus rhythm with frequent PACs and isolated PVCs with brief atrial runs. Symptoms correlated with PACs. Continue metoprolol 25 mg twice daily and 400 mg of magnesium oxide daily. Magnesium level was 2 on 05/21/17.  2. Accelerated hypertension: Blood pressure is severely elevated.  I will start amlodipine 5 mg daily.  3. Hypomagnesemia: Magnesium level was 2 on 05/21/17. Continue Mag oxide.  4. Generalized  anxiety disorder: Symptomatic improvement with Paxil 20 mg daily.  No changes.      Disposition: Follow up 3 months   Kate Sable, M.D., F.A.C.C.

## 2017-12-07 NOTE — Patient Instructions (Signed)
Medication Instructions:   Begin Amlodipine 5mg  daily.  Continue all other medications.    Labwork: none  Testing/Procedures: none  Follow-Up: 3 months   Any Other Special Instructions Will Be Listed Below (If Applicable).  If you need a refill on your cardiac medications before your next appointment, please call your pharmacy.

## 2018-02-18 DIAGNOSIS — M4126 Other idiopathic scoliosis, lumbar region: Secondary | ICD-10-CM | POA: Diagnosis not present

## 2018-02-18 DIAGNOSIS — M4716 Other spondylosis with myelopathy, lumbar region: Secondary | ICD-10-CM | POA: Diagnosis not present

## 2018-02-23 DIAGNOSIS — N390 Urinary tract infection, site not specified: Secondary | ICD-10-CM | POA: Diagnosis not present

## 2018-02-23 DIAGNOSIS — R509 Fever, unspecified: Secondary | ICD-10-CM | POA: Diagnosis not present

## 2018-02-23 DIAGNOSIS — J019 Acute sinusitis, unspecified: Secondary | ICD-10-CM | POA: Diagnosis not present

## 2018-02-23 DIAGNOSIS — Z6824 Body mass index (BMI) 24.0-24.9, adult: Secondary | ICD-10-CM | POA: Diagnosis not present

## 2018-03-05 DIAGNOSIS — R300 Dysuria: Secondary | ICD-10-CM | POA: Diagnosis not present

## 2018-03-05 DIAGNOSIS — Z6824 Body mass index (BMI) 24.0-24.9, adult: Secondary | ICD-10-CM | POA: Diagnosis not present

## 2018-03-08 DIAGNOSIS — Z79899 Other long term (current) drug therapy: Secondary | ICD-10-CM | POA: Diagnosis not present

## 2018-03-08 DIAGNOSIS — I10 Essential (primary) hypertension: Secondary | ICD-10-CM | POA: Diagnosis not present

## 2018-03-08 DIAGNOSIS — E7800 Pure hypercholesterolemia, unspecified: Secondary | ICD-10-CM | POA: Diagnosis not present

## 2018-03-12 DIAGNOSIS — M25552 Pain in left hip: Secondary | ICD-10-CM | POA: Diagnosis not present

## 2018-03-12 DIAGNOSIS — E7800 Pure hypercholesterolemia, unspecified: Secondary | ICD-10-CM | POA: Diagnosis not present

## 2018-03-12 DIAGNOSIS — I480 Paroxysmal atrial fibrillation: Secondary | ICD-10-CM | POA: Diagnosis not present

## 2018-03-12 DIAGNOSIS — F411 Generalized anxiety disorder: Secondary | ICD-10-CM | POA: Diagnosis not present

## 2018-03-12 DIAGNOSIS — R002 Palpitations: Secondary | ICD-10-CM | POA: Diagnosis not present

## 2018-03-12 DIAGNOSIS — I10 Essential (primary) hypertension: Secondary | ICD-10-CM | POA: Diagnosis not present

## 2018-03-12 DIAGNOSIS — Z6825 Body mass index (BMI) 25.0-25.9, adult: Secondary | ICD-10-CM | POA: Diagnosis not present

## 2018-03-25 ENCOUNTER — Encounter: Payer: Self-pay | Admitting: *Deleted

## 2018-03-26 ENCOUNTER — Encounter: Payer: Self-pay | Admitting: Cardiovascular Disease

## 2018-03-26 ENCOUNTER — Ambulatory Visit: Payer: PPO | Admitting: Cardiovascular Disease

## 2018-03-26 VITALS — BP 142/72 | HR 56 | Ht 66.0 in | Wt 152.0 lb

## 2018-03-26 DIAGNOSIS — R002 Palpitations: Secondary | ICD-10-CM | POA: Diagnosis not present

## 2018-03-26 DIAGNOSIS — I10 Essential (primary) hypertension: Secondary | ICD-10-CM | POA: Diagnosis not present

## 2018-03-26 DIAGNOSIS — F411 Generalized anxiety disorder: Secondary | ICD-10-CM | POA: Diagnosis not present

## 2018-03-26 MED ORDER — METOPROLOL TARTRATE 25 MG PO TABS: 25.0000 mg | ORAL_TABLET | Freq: Two times a day (BID) | ORAL | 3 refills | Status: DC

## 2018-03-26 NOTE — Patient Instructions (Signed)

## 2018-03-26 NOTE — Progress Notes (Signed)
SUBJECTIVE: The patient presents for follow-up of palpitations and hypertension.  Holter monitoring in August 2018 demonstrated sinus rhythm with frequent PACs and isolated PVCs with brief atrial runs. Symptoms correlated with PACs.  She was also found to be hypomagnesemic and I started her on magnesium oxide.  She also has a history of anxiety for which I initially started Paxil.  She is doing very well overall.  She continues to have palpitations but they are more seldom and frequency.  She denies exertional chest pain and shortness of breath.  Systolic blood pressures at home run in the 116-120 range.  She tries to stay active around the house.  She has had problems with back pain.  ECG performed in the office today which I ordered and personally interpreted demonstrates sinus bradycardia with first-degree AV block, heart rate 54 bpm, PR interval 234 ms, with septal Q waves.    Review of Systems: As per "subjective", otherwise negative.  No Known Allergies  Current Outpatient Medications  Medication Sig Dispense Refill  . amLODipine (NORVASC) 5 MG tablet Take 1 tablet (5 mg total) by mouth daily. 30 tablet 6  . fluticasone (FLONASE) 50 MCG/ACT nasal spray Place 2 sprays into the nose daily as needed for allergies.     [name] losartan (COZAAR) 100 MG tablet Take 1 tablet (100 mg total) by mouth daily. 90 tablet 3  . metoprolol tartrate (LOPRESSOR) 25 MG tablet Take 1 tablet (25 mg total) by mouth 2 (two) times daily. 180 tablet 3  . oxyCODONE-acetaminophen (PERCOCET) 7.5-325 MG tablet Take 1 tablet by mouth every 6 (six) hours as needed for severe pain.    [name] PARoxetine (PAXIL) 20 MG tablet Take 1 tablet (20 mg total) by mouth daily. 30 tablet 1  . sodium chloride (OCEAN) 0.65 % SOLN nasal spray Place 1 spray into both nostrils as needed for congestion. 1 Bottle 0   No current facility-administered medications for this visit.     Past Medical History:  Diagnosis Date  .  Allergic rhinitis   . Frequency of urination   . History of atrial fibrillation    episode 2013 converted with cardizem-- happened at time acute illness due to kidney stone --  resolved  no issues since  . History of kidney stones   . History of sepsis    urosepsis  2013 due to kidney stone  . Intermittent palpitations    controlled w/ atenolol  . Left nephrolithiasis   . Left ureteral calculus   . PAC (premature atrial contraction)   . Palpitations   . Solitary lung nodule    benign right middle lobe per CT  . Urgency of urination   . Wears dentures    upper  . Wears partial dentures    lower    Past Surgical History:  Procedure Laterality Date  . CATARACT EXTRACTION W/ INTRAOCULAR LENS  IMPLANT, BILATERAL  2010  . CYSTOSCOPY WITH STENT PLACEMENT Left 09/15/2014   Procedure: CYSTOSCOPY WITH STENT PLACEMENT;  Surgeon: Georgette Dover, MD;  Location: [facility];  Service: Urology;  Laterality: Left;  . CYSTOSCOPY/RETROGRADE/URETEROSCOPY Left 08/09/2014   Procedure: CYSTOSCOPY/RETROGRADE/LEFT STENT;  Surgeon: Bernestine Amass, MD;  Location: [facility];  Service: Urology;  Laterality: Left;  . EXTRACORPOREAL SHOCK WAVE LITHOTRIPSY  2013  . HOLMIUM LASER APPLICATION Left 27/12/5007   Procedure: HOLMIUM LASER APPLICATION;  Surgeon: Georgette Dover, MD;  Location: [facility];  Service: Urology;  Laterality: Left;  .  LAPAROTOMY W/  UNILATERAL SALPINGOOPHORECTOMY  age 40  . TOTAL ABDOMINAL HYSTERECTOMY  age 50   w/ unilateral salpingoophorectomy  . TRANSTHORACIC ECHOCARDIOGRAM  02-13-2012   mild LVH/  ef 65%/  trivial MR  &  PR/  mild RVE  . URETEROSCOPY Left 09/15/2014   Procedure: URETEROSCOPY;  Surgeon: Georgette Dover, MD;  Location: [facility];  Service: Urology;  Laterality: Left;    Social History   Socioeconomic History  . Marital status: Married    Spouse name: Not on file  . Number of children: 1  . Years of  education: Not on file  . Highest education level: Not on file  Occupational History    Employer: RETIRED  Social Needs  . Financial resource strain: Not on file  . Food insecurity:    Worry: Not on file    Inability: Not on file  . Transportation needs:    Medical: Not on file    Non-medical: Not on file  Tobacco Use  . Smoking status: Never Smoker  . Smokeless tobacco: Never Used  Substance and Sexual Activity  . Alcohol use: No  . Drug use: No  . Sexual activity: Not on file  Lifestyle  . Physical activity:    Days per week: Not on file    Minutes per session: Not on file  . Stress: Not on file  Relationships  . Social connections:    Talks on phone: Not on file    Gets together: Not on file    Attends religious service: Not on file    Active member of club or organization: Not on file    Attends meetings of clubs or organizations: Not on file    Relationship status: Not on file  . Intimate partner violence:    Fear of current or ex partner: Not on file    Emotionally abused: Not on file    Physically abused: Not on file    Forced sexual activity: Not on file  Other Topics Concern  . Not on file  Social History Narrative  . Not on file     Vitals:   03/26/18 1128  BP: (!) 142/72  Pulse: (!) 56  SpO2: 96%  Weight: 152 lb (68.9 kg)  Height: 5\' 6"  (1.676 m)    Wt Readings from Last 3 Encounters:  03/26/18 152 lb (68.9 kg)  12/07/17 147 lb (66.7 kg)  06/08/17 156 lb (70.8 kg)     PHYSICAL EXAM General: NAD HEENT: Normal. Neck: No JVD, no thyromegaly. Lungs: Clear to auscultation bilaterally with normal respiratory effort. CV: Regular rate and rhythm, normal S1/S2, no S3/S4, no murmur. No pretibial or periankle edema.  No carotid bruit.   Abdomen: Soft, nontender, no distention.  Neurologic: Alert and oriented.  Psych: Normal affect. Skin: Normal. Musculoskeletal: No gross deformities.    ECG: Most recent ECG reviewed.   Labs: Lab Results    Component Value Date/Time   K 4.1 04/30/2017 06:11 AM   BUN 20 04/30/2017 06:11 AM   CREATININE 1.10 (H) 04/30/2017 06:11 AM   ALT 17 04/29/2017 06:13 AM   TSH 1.406 04/28/2017 10:50 AM   HGB 12.8 04/29/2017 06:13 AM     Lipids: No results found for: LDLCALC, LDLDIRECT, CHOL, TRIG, HDL     ASSESSMENT AND PLAN: 1. Palpitations:  Symptomatically stable overall.  Symptoms improved with Paxil.  Holter monitoring reviewed above with frequent PACs and isolated PVCs with brief atrial runs.  Symptoms appear to  correlate with PACs.  Continue metoprolol 25 mg twice daily and 400 mg of magnesium oxide daily.  2. Accelerated hypertension:  Blood pressure is mildly elevated.  This will need further monitoring.  It appears to be normal at home.  No changes to therapy.  3. Hypomagnesemia:Continue magnesium oxide.  4. Generalized anxiety disorder:  Symptomatic improvement with Paxil 20 mg daily.  No changes.      Disposition: Follow up 6 months   Kate Sable, M.D., F.A.C.C.

## 2018-04-01 DIAGNOSIS — M11252 Other chondrocalcinosis, left hip: Secondary | ICD-10-CM | POA: Diagnosis not present

## 2018-04-01 DIAGNOSIS — M11251 Other chondrocalcinosis, right hip: Secondary | ICD-10-CM | POA: Diagnosis not present

## 2018-04-01 DIAGNOSIS — M4716 Other spondylosis with myelopathy, lumbar region: Secondary | ICD-10-CM | POA: Diagnosis not present

## 2018-04-01 DIAGNOSIS — M419 Scoliosis, unspecified: Secondary | ICD-10-CM | POA: Diagnosis not present

## 2018-04-01 DIAGNOSIS — M25552 Pain in left hip: Secondary | ICD-10-CM | POA: Diagnosis not present

## 2018-04-12 DIAGNOSIS — M4805 Spinal stenosis, thoracolumbar region: Secondary | ICD-10-CM | POA: Diagnosis not present

## 2018-04-12 DIAGNOSIS — M4716 Other spondylosis with myelopathy, lumbar region: Secondary | ICD-10-CM | POA: Diagnosis not present

## 2018-04-12 DIAGNOSIS — M48061 Spinal stenosis, lumbar region without neurogenic claudication: Secondary | ICD-10-CM | POA: Diagnosis not present

## 2018-04-12 DIAGNOSIS — M5135 Other intervertebral disc degeneration, thoracolumbar region: Secondary | ICD-10-CM | POA: Diagnosis not present

## 2018-04-12 DIAGNOSIS — M5136 Other intervertebral disc degeneration, lumbar region: Secondary | ICD-10-CM | POA: Diagnosis not present

## 2018-04-12 DIAGNOSIS — Z981 Arthrodesis status: Secondary | ICD-10-CM | POA: Diagnosis not present

## 2018-04-12 DIAGNOSIS — M545 Low back pain: Secondary | ICD-10-CM | POA: Diagnosis not present

## 2018-04-18 DIAGNOSIS — M25552 Pain in left hip: Secondary | ICD-10-CM | POA: Diagnosis not present

## 2018-04-23 DIAGNOSIS — M25552 Pain in left hip: Secondary | ICD-10-CM | POA: Diagnosis not present

## 2018-04-23 DIAGNOSIS — M1612 Unilateral primary osteoarthritis, left hip: Secondary | ICD-10-CM | POA: Diagnosis not present

## 2018-05-08 DIAGNOSIS — R350 Frequency of micturition: Secondary | ICD-10-CM | POA: Diagnosis not present

## 2018-05-08 DIAGNOSIS — R3915 Urgency of urination: Secondary | ICD-10-CM | POA: Diagnosis not present

## 2018-05-08 DIAGNOSIS — N200 Calculus of kidney: Secondary | ICD-10-CM | POA: Diagnosis not present

## 2018-05-29 DIAGNOSIS — I10 Essential (primary) hypertension: Secondary | ICD-10-CM | POA: Diagnosis not present

## 2018-05-29 DIAGNOSIS — N200 Calculus of kidney: Secondary | ICD-10-CM | POA: Diagnosis not present

## 2018-05-29 DIAGNOSIS — F331 Major depressive disorder, recurrent, moderate: Secondary | ICD-10-CM | POA: Diagnosis not present

## 2018-06-18 ENCOUNTER — Other Ambulatory Visit: Payer: Self-pay | Admitting: Cardiovascular Disease

## 2018-07-05 ENCOUNTER — Other Ambulatory Visit: Payer: Self-pay | Admitting: Cardiovascular Disease

## 2018-07-15 DIAGNOSIS — Z23 Encounter for immunization: Secondary | ICD-10-CM | POA: Diagnosis not present

## 2018-07-23 DIAGNOSIS — M25552 Pain in left hip: Secondary | ICD-10-CM | POA: Diagnosis not present

## 2018-07-23 DIAGNOSIS — M419 Scoliosis, unspecified: Secondary | ICD-10-CM | POA: Diagnosis not present

## 2018-08-05 ENCOUNTER — Other Ambulatory Visit: Payer: Self-pay | Admitting: Cardiovascular Disease

## 2018-08-21 ENCOUNTER — Other Ambulatory Visit: Payer: Self-pay | Admitting: *Deleted

## 2018-08-21 MED ORDER — METOPROLOL TARTRATE 25 MG PO TABS: 25.0000 mg | ORAL_TABLET | Freq: Two times a day (BID) | ORAL | 2 refills | Status: DC

## 2018-08-31 DIAGNOSIS — I10 Essential (primary) hypertension: Secondary | ICD-10-CM | POA: Diagnosis not present

## 2018-08-31 DIAGNOSIS — F331 Major depressive disorder, recurrent, moderate: Secondary | ICD-10-CM | POA: Diagnosis not present

## 2018-08-31 DIAGNOSIS — N200 Calculus of kidney: Secondary | ICD-10-CM | POA: Diagnosis not present

## 2018-09-05 DIAGNOSIS — I480 Paroxysmal atrial fibrillation: Secondary | ICD-10-CM | POA: Diagnosis not present

## 2018-09-05 DIAGNOSIS — E876 Hypokalemia: Secondary | ICD-10-CM | POA: Diagnosis not present

## 2018-09-05 DIAGNOSIS — E871 Hypo-osmolality and hyponatremia: Secondary | ICD-10-CM | POA: Diagnosis not present

## 2018-09-05 DIAGNOSIS — N183 Chronic kidney disease, stage 3 (moderate): Secondary | ICD-10-CM | POA: Diagnosis not present

## 2018-09-05 DIAGNOSIS — I10 Essential (primary) hypertension: Secondary | ICD-10-CM | POA: Diagnosis not present

## 2018-09-10 DIAGNOSIS — E7800 Pure hypercholesterolemia, unspecified: Secondary | ICD-10-CM | POA: Diagnosis not present

## 2018-09-10 DIAGNOSIS — R350 Frequency of micturition: Secondary | ICD-10-CM | POA: Diagnosis not present

## 2018-09-10 DIAGNOSIS — M25552 Pain in left hip: Secondary | ICD-10-CM | POA: Diagnosis not present

## 2018-09-10 DIAGNOSIS — Z6826 Body mass index (BMI) 26.0-26.9, adult: Secondary | ICD-10-CM | POA: Diagnosis not present

## 2018-09-10 DIAGNOSIS — I10 Essential (primary) hypertension: Secondary | ICD-10-CM | POA: Diagnosis not present

## 2018-09-10 DIAGNOSIS — I480 Paroxysmal atrial fibrillation: Secondary | ICD-10-CM | POA: Diagnosis not present

## 2018-09-10 DIAGNOSIS — F411 Generalized anxiety disorder: Secondary | ICD-10-CM | POA: Diagnosis not present

## 2018-09-10 DIAGNOSIS — R002 Palpitations: Secondary | ICD-10-CM | POA: Diagnosis not present

## 2018-09-30 DIAGNOSIS — I480 Paroxysmal atrial fibrillation: Secondary | ICD-10-CM | POA: Diagnosis not present

## 2018-09-30 DIAGNOSIS — E7800 Pure hypercholesterolemia, unspecified: Secondary | ICD-10-CM | POA: Diagnosis not present

## 2018-09-30 DIAGNOSIS — I10 Essential (primary) hypertension: Secondary | ICD-10-CM | POA: Diagnosis not present

## 2018-10-01 ENCOUNTER — Encounter: Payer: Self-pay | Admitting: Cardiovascular Disease

## 2018-10-01 ENCOUNTER — Ambulatory Visit: Payer: PPO | Admitting: Cardiovascular Disease

## 2018-10-01 VITALS — BP 118/70 | HR 56 | Ht 66.5 in | Wt 158.0 lb

## 2018-10-01 DIAGNOSIS — R002 Palpitations: Secondary | ICD-10-CM

## 2018-10-01 DIAGNOSIS — M25471 Effusion, right ankle: Secondary | ICD-10-CM

## 2018-10-01 DIAGNOSIS — M25473 Effusion, unspecified ankle: Secondary | ICD-10-CM | POA: Diagnosis not present

## 2018-10-01 DIAGNOSIS — F411 Generalized anxiety disorder: Secondary | ICD-10-CM

## 2018-10-01 DIAGNOSIS — I10 Essential (primary) hypertension: Secondary | ICD-10-CM | POA: Diagnosis not present

## 2018-10-01 DIAGNOSIS — M25476 Effusion, unspecified foot: Secondary | ICD-10-CM | POA: Diagnosis not present

## 2018-10-01 NOTE — Patient Instructions (Signed)

## 2018-10-01 NOTE — Progress Notes (Signed)
SUBJECTIVE:  The patient presents for follow-up of palpitations and hypertension.  Holter monitoringin August 2018demonstrated sinus rhythm with frequent PACs and isolated PVCs with brief atrial runs. Symptoms correlated with PACs.  She was also found to be hypomagnesemic and I started her on magnesium oxide.  She also has a history of anxiety for which I initially started Paxil.  She is doing well overall and denies chest pain and shortness of breath.  She seldom has palpitations.  She does have some ankle and feet swelling and recently had a UTI.     Review of Systems: As per "subjective", otherwise negative.  No Known Allergies  Current Outpatient Medications  Medication Sig Dispense Refill  . amLODipine (NORVASC) 5 MG tablet TAKE 1 TABLET ONCE DAILY. 90 tablet 0  . fluticasone (FLONASE) 50 MCG/ACT nasal spray Place 2 sprays into the nose daily as needed for allergies.     [name] losartan (COZAAR) 100 MG tablet TAKE 1 TABLET ONCE DAILY. 90 tablet 2  . methenamine (MANDELAMINE) 1 g tablet Take 500 mg by mouth 2 (two) times daily.    . metoprolol tartrate (LOPRESSOR) 25 MG tablet Take 1 tablet (25 mg total) by mouth 2 (two) times daily. 180 tablet 2  . oxyCODONE-acetaminophen (PERCOCET) 7.5-325 MG tablet Take 1 tablet by mouth every 4 (four) hours as needed for severe pain.    [name] PARoxetine (PAXIL) 20 MG tablet Take 1 tablet (20 mg total) by mouth daily. 30 tablet 1  . sodium chloride (OCEAN) 0.65 % SOLN nasal spray Place 1 spray into both nostrils as needed for congestion. 1 Bottle 0  . trimethoprim (TRIMPEX) 100 MG tablet Take 100 mg by mouth daily.     No current facility-administered medications for this visit.     Past Medical History:  Diagnosis Date  . Allergic rhinitis   . Frequency of urination   . History of atrial fibrillation    episode 2013 converted with cardizem-- happened at time acute illness due to kidney stone --  resolved  no issues since  . History  of kidney stones   . History of sepsis    urosepsis  2013 due to kidney stone  . Intermittent palpitations    controlled w/ atenolol  . Left nephrolithiasis   . Left ureteral calculus   . PAC (premature atrial contraction)   . Palpitations   . Solitary lung nodule    benign right middle lobe per CT  . Urgency of urination   . Wears dentures    upper  . Wears partial dentures    lower    Past Surgical History:  Procedure Laterality Date  . CATARACT EXTRACTION W/ INTRAOCULAR LENS  IMPLANT, BILATERAL  2010  . CYSTOSCOPY WITH STENT PLACEMENT Left 09/15/2014   Procedure: CYSTOSCOPY WITH STENT PLACEMENT;  Surgeon: Georgette Dover, MD;  Location: [facility];  Service: Urology;  Laterality: Left;  . CYSTOSCOPY/RETROGRADE/URETEROSCOPY Left 08/09/2014   Procedure: CYSTOSCOPY/RETROGRADE/LEFT STENT;  Surgeon: Bernestine Amass, MD;  Location: [facility];  Service: Urology;  Laterality: Left;  . EXTRACORPOREAL SHOCK WAVE LITHOTRIPSY  2013  . HOLMIUM LASER APPLICATION Left 78/24/2353   Procedure: HOLMIUM LASER APPLICATION;  Surgeon: Georgette Dover, MD;  Location: [facility];  Service: Urology;  Laterality: Left;  . LAPAROTOMY W/  UNILATERAL SALPINGOOPHORECTOMY  age 57  . TOTAL ABDOMINAL HYSTERECTOMY  age 39   w/ unilateral salpingoophorectomy  . TRANSTHORACIC ECHOCARDIOGRAM  02-13-2012   mild  LVH/  ef 65%/  trivial MR  &  PR/  mild RVE  . URETEROSCOPY Left 09/15/2014   Procedure: URETEROSCOPY;  Surgeon: Georgette Dover, MD;  Location: [facility];  Service: Urology;  Laterality: Left;    Social History   Socioeconomic History  . Marital status: Married    Spouse name: Not on file  . Number of children: 1  . Years of education: Not on file  . Highest education level: Not on file  Occupational History    Employer: RETIRED  Social Needs  . Financial resource strain: Not on file  . Food insecurity:    Worry: Not on file    Inability:  Not on file  . Transportation needs:    Medical: Not on file    Non-medical: Not on file  Tobacco Use  . Smoking status: Never Smoker  . Smokeless tobacco: Never Used  Substance and Sexual Activity  . Alcohol use: No  . Drug use: No  . Sexual activity: Not on file  Lifestyle  . Physical activity:    Days per week: Not on file    Minutes per session: Not on file  . Stress: Not on file  Relationships  . Social connections:    Talks on phone: Not on file    Gets together: Not on file    Attends religious service: Not on file    Active member of club or organization: Not on file    Attends meetings of clubs or organizations: Not on file    Relationship status: Not on file  . Intimate partner violence:    Fear of current or ex partner: Not on file    Emotionally abused: Not on file    Physically abused: Not on file    Forced sexual activity: Not on file  Other Topics Concern  . Not on file  Social History Narrative  . Not on file     Vitals:   10/01/18 1259  BP: 118/70  Pulse: (!) 56  SpO2: 97%  Weight: 158 lb (71.7 kg)  Height: 5' 6.5" (1.689 m)    Wt Readings from Last 3 Encounters:  10/01/18 158 lb (71.7 kg)  03/26/18 152 lb (68.9 kg)  12/07/17 147 lb (66.7 kg)     PHYSICAL EXAM General: NAD HEENT: Normal. Neck: No JVD, no thyromegaly. Lungs: Clear to auscultation bilaterally with normal respiratory effort. CV: Regular rate and rhythm, normal S1/S2, no S3/S4, no murmur.  Trace bilateral peri-ankle and dorsal pedal edema.  No carotid bruit.   Abdomen: Soft, nontender, no distention.  Neurologic: Alert and oriented.  Psych: Normal affect. Skin: Normal. Musculoskeletal: No gross deformities.    ECG: Reviewed above under Subjective   Labs: Lab Results  Component Value Date/Time   K 4.1 04/30/2017 06:11 AM   BUN 20 04/30/2017 06:11 AM   CREATININE 1.10 (H) 04/30/2017 06:11 AM   ALT 17 04/29/2017 06:13 AM   TSH 1.406 04/28/2017 10:50 AM   HGB 12.8  04/29/2017 06:13 AM     Lipids: No results found for: LDLCALC, LDLDIRECT, CHOL, TRIG, HDL     ASSESSMENT AND PLAN:  1. Palpitations: Symptomatically stable overall.  Symptoms improved with Paxil.  Holter monitoring reviewed above with frequent PACs and isolated PVCs with brief atrial runs.  Symptoms appear to correlate with PACs.  Continue metoprolol 25 mg twice daily and 400 mg of magnesium oxide daily.  2.Accelerated hypertension: Blood pressure is normal.  No changes to therapy.  3. Hypomagnesemia:Continue magnesium oxide.  4. Generalized anxiety disorder: Symptomatic improvement with Paxil 20 mg daily.  No changes.  5.  Bilateral ankle and feet swelling: I will prescribe knee-high compression stockings.   Disposition: Follow up 1 year   Kate Sable, M.D., F.A.C.C.

## 2018-10-22 DIAGNOSIS — R2241 Localized swelling, mass and lump, right lower limb: Secondary | ICD-10-CM | POA: Diagnosis not present

## 2018-10-22 DIAGNOSIS — M4716 Other spondylosis with myelopathy, lumbar region: Secondary | ICD-10-CM | POA: Diagnosis not present

## 2018-10-22 DIAGNOSIS — M419 Scoliosis, unspecified: Secondary | ICD-10-CM | POA: Diagnosis not present

## 2018-10-24 DIAGNOSIS — R600 Localized edema: Secondary | ICD-10-CM | POA: Diagnosis not present

## 2018-10-24 DIAGNOSIS — R2241 Localized swelling, mass and lump, right lower limb: Secondary | ICD-10-CM | POA: Diagnosis not present

## 2018-10-31 DIAGNOSIS — I10 Essential (primary) hypertension: Secondary | ICD-10-CM | POA: Diagnosis not present

## 2018-10-31 DIAGNOSIS — E782 Mixed hyperlipidemia: Secondary | ICD-10-CM | POA: Diagnosis not present

## 2018-11-04 ENCOUNTER — Other Ambulatory Visit: Payer: Self-pay | Admitting: Cardiovascular Disease

## 2018-11-13 DIAGNOSIS — E782 Mixed hyperlipidemia: Secondary | ICD-10-CM | POA: Diagnosis not present

## 2018-11-13 DIAGNOSIS — M545 Low back pain: Secondary | ICD-10-CM | POA: Diagnosis not present

## 2018-11-13 DIAGNOSIS — I10 Essential (primary) hypertension: Secondary | ICD-10-CM | POA: Diagnosis not present

## 2018-11-13 DIAGNOSIS — F331 Major depressive disorder, recurrent, moderate: Secondary | ICD-10-CM | POA: Diagnosis not present

## 2018-11-13 DIAGNOSIS — Z6826 Body mass index (BMI) 26.0-26.9, adult: Secondary | ICD-10-CM | POA: Diagnosis not present

## 2018-11-29 DIAGNOSIS — I10 Essential (primary) hypertension: Secondary | ICD-10-CM | POA: Diagnosis not present

## 2018-11-29 DIAGNOSIS — E782 Mixed hyperlipidemia: Secondary | ICD-10-CM | POA: Diagnosis not present

## 2018-11-29 DIAGNOSIS — F331 Major depressive disorder, recurrent, moderate: Secondary | ICD-10-CM | POA: Diagnosis not present

## 2018-12-09 DIAGNOSIS — N3020 Other chronic cystitis without hematuria: Secondary | ICD-10-CM | POA: Diagnosis not present

## 2018-12-09 DIAGNOSIS — N200 Calculus of kidney: Secondary | ICD-10-CM | POA: Diagnosis not present

## 2018-12-19 DIAGNOSIS — N3020 Other chronic cystitis without hematuria: Secondary | ICD-10-CM | POA: Diagnosis not present

## 2019-02-03 IMAGING — CT CT ANGIO CHEST
2 of 6 series · 19 of 46 positions shown · IV contrast (Isovue)
Comparison: Chest x-ray earlier today

CLINICAL DATA: Heart palpitations.  Elevated D-dimer

EXAM:
CT ANGIOGRAPHY CHEST WITH CONTRAST
TECHNIQUE: Multidetector CT imaging of the chest was performed using the
standard protocol during bolus administration of intravenous
contrast. Multiplanar CT image reconstructions and MIPs were
obtained to evaluate the vascular anatomy.
CONTRAST:  70 cc Isovue 370 IV

[Series 5: thins · axial · 0.73mm/px · z∈[+830,+1088]mm · 16 of 284 slices shown]
[im 13/284  lung]
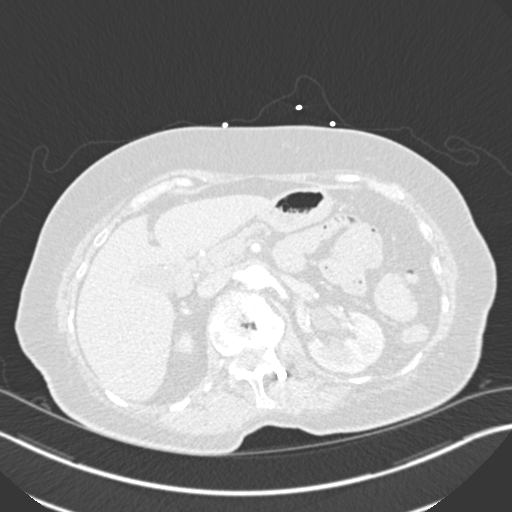
[im 37/284  soft-tissue]
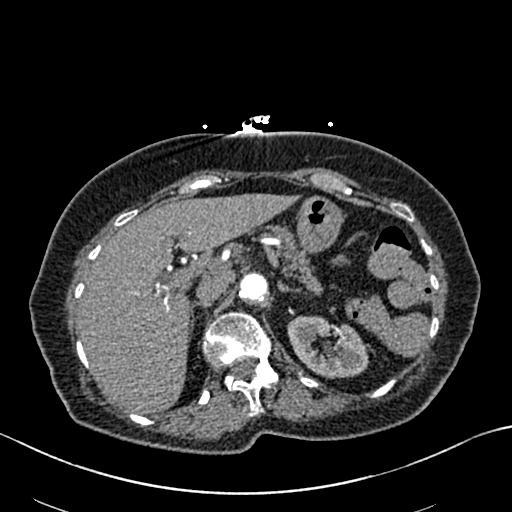
[im 50/284  lung]
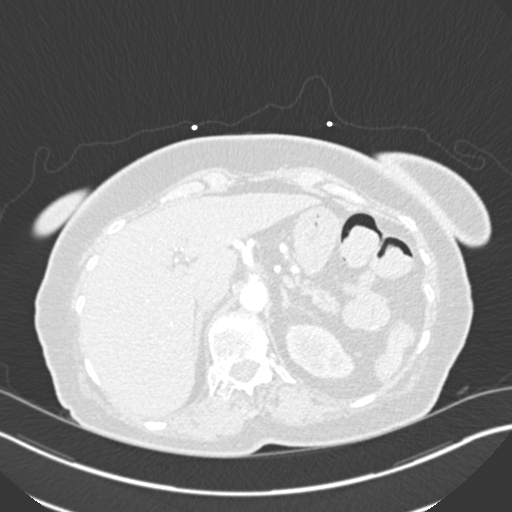
[im 62/284  soft-tissue]
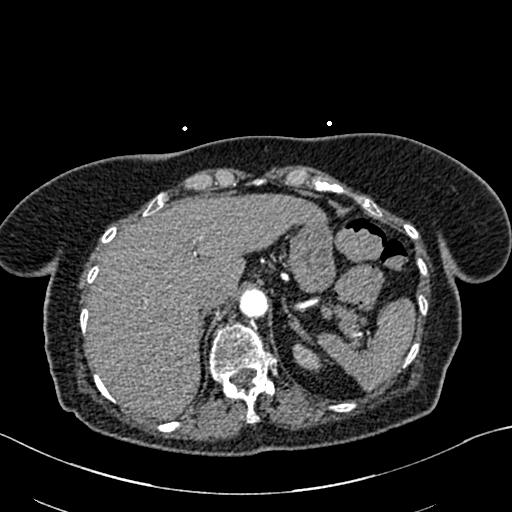
[im 87/284  lung]
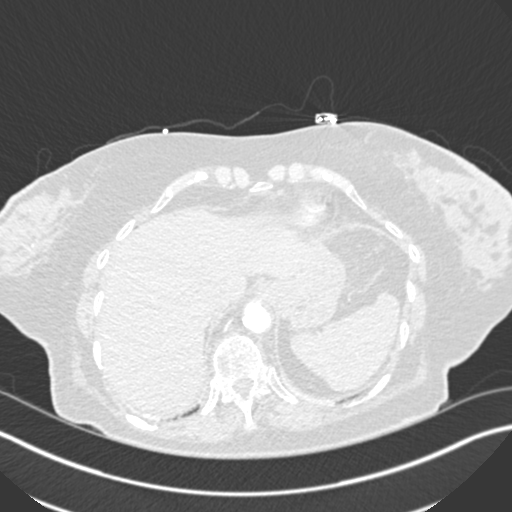
[im 99/284  soft-tissue]
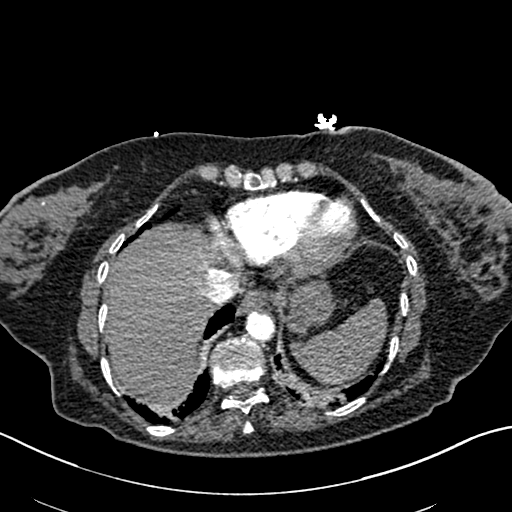
[im 111/284  lung]
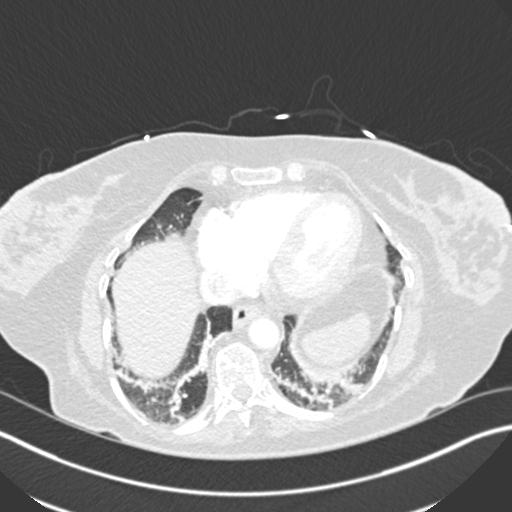
[im 136/284  soft-tissue]
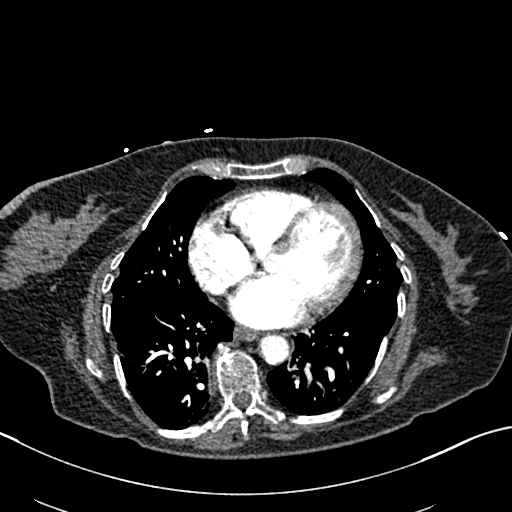
[im 148/284  lung]
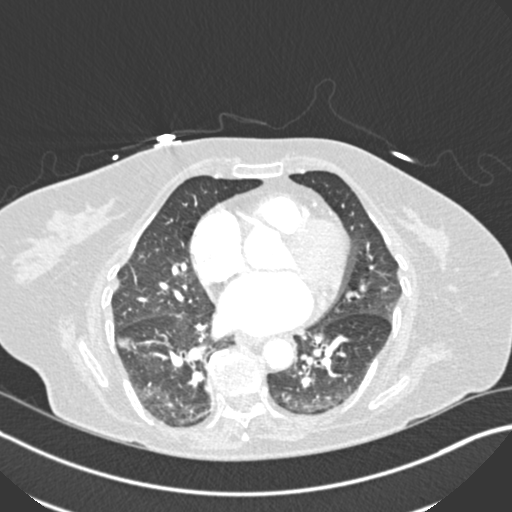
[im 173/284  soft-tissue]
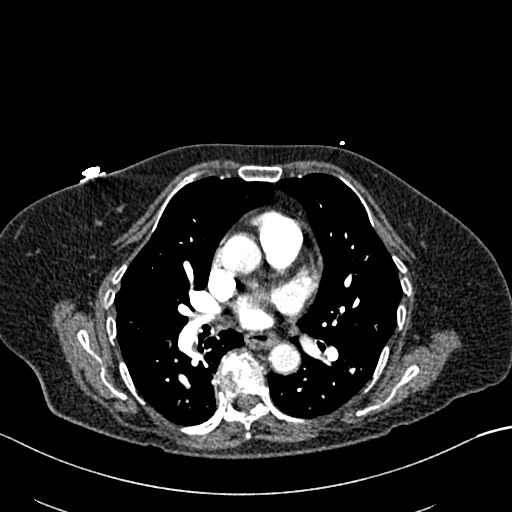
[im 185/284  lung]
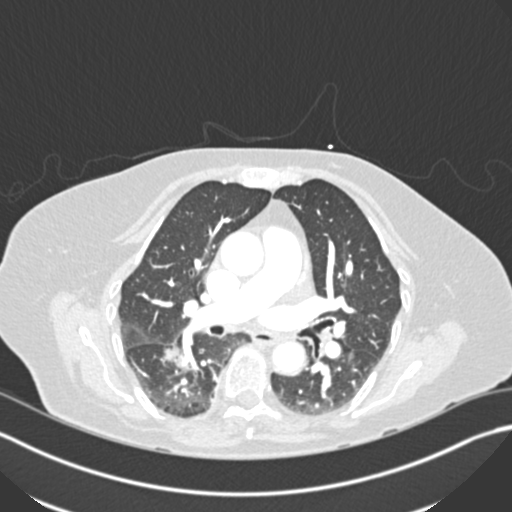
[im 197/284  soft-tissue]
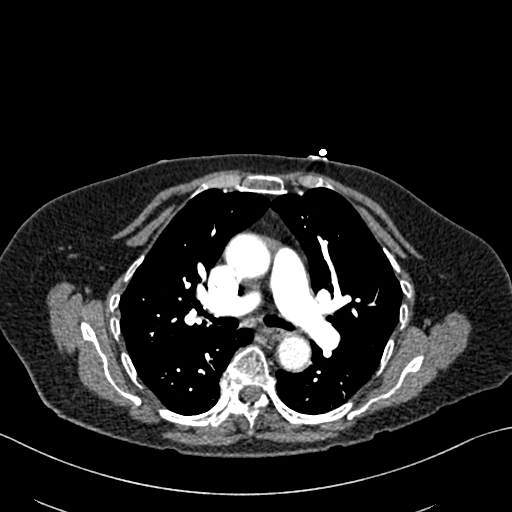
[im 222/284  lung]
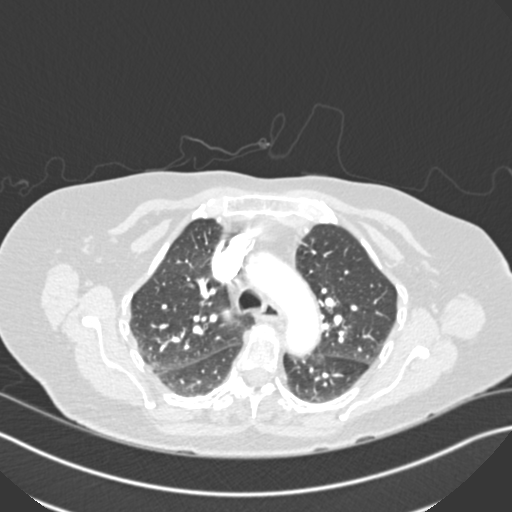
[im 234/284  soft-tissue]
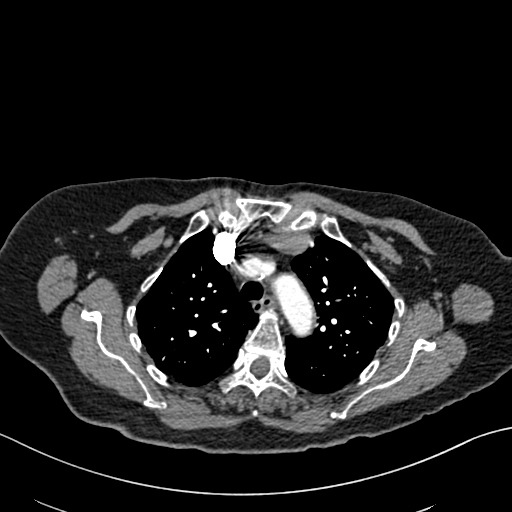
[im 247/284  lung]
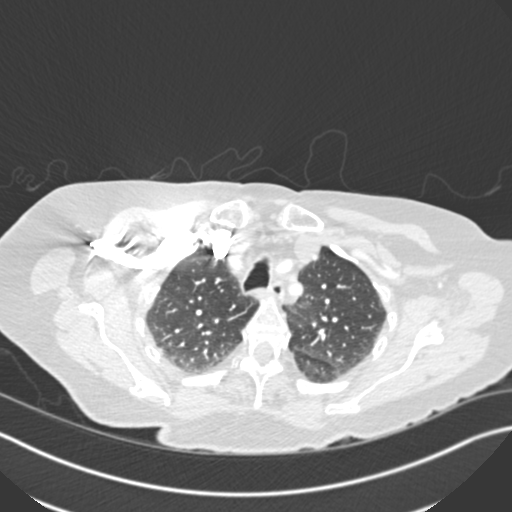
[im 271/284  soft-tissue]
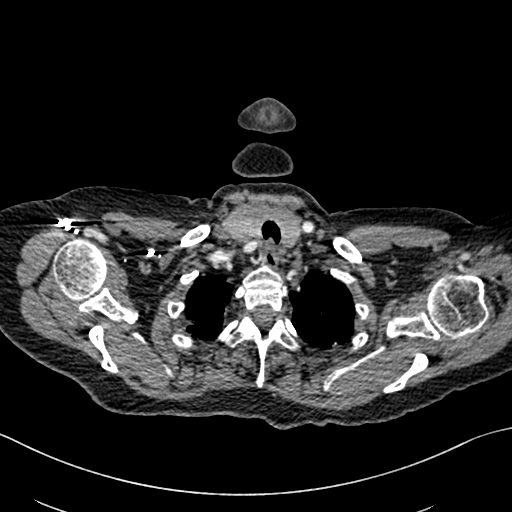

[Series 7: coronal mpr · coronal · 0.56mm/px · 3 of 129 slices shown]
[im 33/129  soft-tissue]
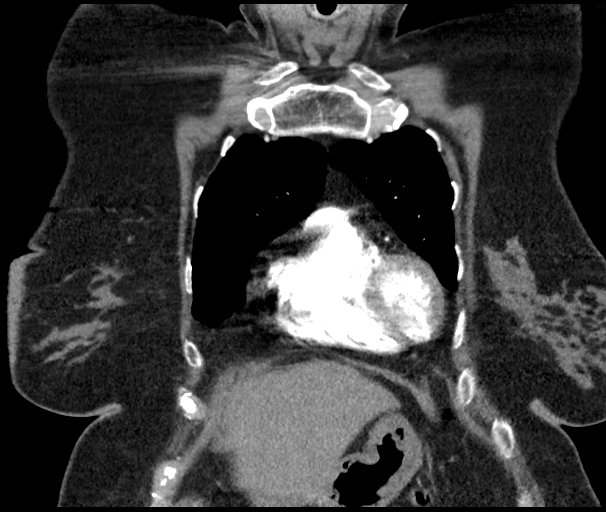
[im 65/129  soft-tissue]
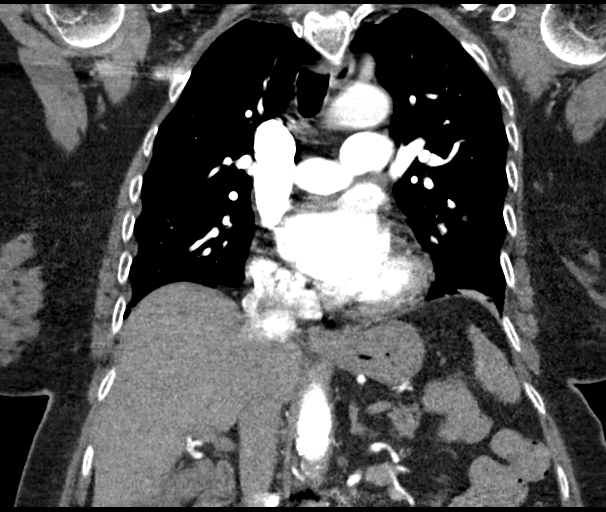
[im 97/129  soft-tissue]
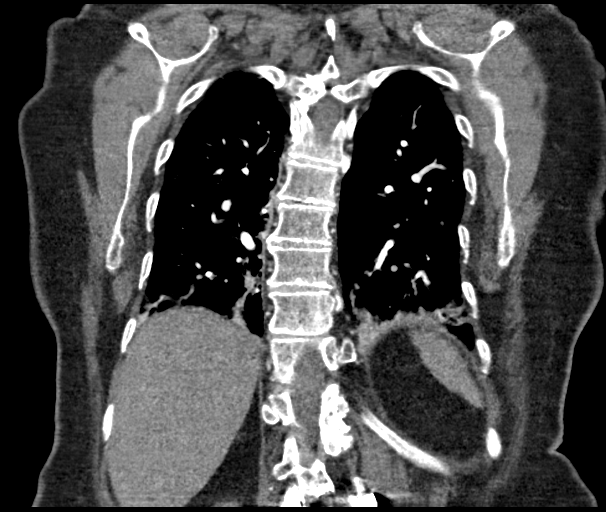

[19 of 46 positions shown; findings below may reference images not displayed]

FINDINGS: Cardiovascular: No filling defects in the pulmonary arteries to
suggest pulmonary emboli. Heart is upper limits normal in size.
Aorta is normal caliber. Scattered aortic calcifications.

Mediastinum/Nodes: No mediastinal, hilar, or axillary adenopathy.

Lungs/Pleura: Areas of atelectasis in the lower lobes bilaterally.
No effusions.

Upper Abdomen: Imaging into the upper abdomen shows no acute
findings.

Musculoskeletal: Chest wall soft tissues are unremarkable. No acute
bony abnormality.

Review of the MIP images confirms the above findings.
IMPRESSION: No evidence of pulmonary embolus.

Bilateral lower lobe atelectasis.

No acute findings.

Aortic Atherosclerosis ([id number]).

## 2019-02-26 DIAGNOSIS — R350 Frequency of micturition: Secondary | ICD-10-CM | POA: Diagnosis not present

## 2019-02-26 DIAGNOSIS — R310 Gross hematuria: Secondary | ICD-10-CM | POA: Diagnosis not present

## 2019-02-26 DIAGNOSIS — N200 Calculus of kidney: Secondary | ICD-10-CM | POA: Diagnosis not present

## 2019-03-04 DIAGNOSIS — N200 Calculus of kidney: Secondary | ICD-10-CM | POA: Diagnosis not present

## 2019-03-04 DIAGNOSIS — R310 Gross hematuria: Secondary | ICD-10-CM | POA: Diagnosis not present

## 2019-03-06 DIAGNOSIS — E7800 Pure hypercholesterolemia, unspecified: Secondary | ICD-10-CM | POA: Diagnosis not present

## 2019-03-06 DIAGNOSIS — E782 Mixed hyperlipidemia: Secondary | ICD-10-CM | POA: Diagnosis not present

## 2019-03-06 DIAGNOSIS — I10 Essential (primary) hypertension: Secondary | ICD-10-CM | POA: Diagnosis not present

## 2019-03-06 DIAGNOSIS — N183 Chronic kidney disease, stage 3 (moderate): Secondary | ICD-10-CM | POA: Diagnosis not present

## 2019-03-06 DIAGNOSIS — E871 Hypo-osmolality and hyponatremia: Secondary | ICD-10-CM | POA: Diagnosis not present

## 2019-03-10 DIAGNOSIS — Z0001 Encounter for general adult medical examination with abnormal findings: Secondary | ICD-10-CM | POA: Diagnosis not present

## 2019-03-10 DIAGNOSIS — F411 Generalized anxiety disorder: Secondary | ICD-10-CM | POA: Diagnosis not present

## 2019-03-10 DIAGNOSIS — R350 Frequency of micturition: Secondary | ICD-10-CM | POA: Diagnosis not present

## 2019-03-10 DIAGNOSIS — R002 Palpitations: Secondary | ICD-10-CM | POA: Diagnosis not present

## 2019-03-10 DIAGNOSIS — I480 Paroxysmal atrial fibrillation: Secondary | ICD-10-CM | POA: Diagnosis not present

## 2019-03-10 DIAGNOSIS — Z6825 Body mass index (BMI) 25.0-25.9, adult: Secondary | ICD-10-CM | POA: Diagnosis not present

## 2019-03-10 DIAGNOSIS — I10 Essential (primary) hypertension: Secondary | ICD-10-CM | POA: Diagnosis not present

## 2019-03-10 DIAGNOSIS — M25552 Pain in left hip: Secondary | ICD-10-CM | POA: Diagnosis not present

## 2019-03-18 DIAGNOSIS — R310 Gross hematuria: Secondary | ICD-10-CM | POA: Diagnosis not present

## 2019-03-18 DIAGNOSIS — N200 Calculus of kidney: Secondary | ICD-10-CM | POA: Diagnosis not present

## 2019-03-21 ENCOUNTER — Other Ambulatory Visit: Payer: Self-pay | Admitting: Urology

## 2019-03-31 ENCOUNTER — Encounter (HOSPITAL_COMMUNITY): Payer: Self-pay

## 2019-03-31 NOTE — Patient Instructions (Addendum)
YOU NEED TO HAVE A COVID 19 TEST ON_Friday 7/3______ @_11 :00______, THIS TEST MUST BE DONE BEFORE SURGERY, COME TO [facility] ENTRANCE. ONCE YOUR COVID TEST IS COMPLETED, PLEASE BEGIN THE QUARANTINE INSTRUCTIONS AS OUTLINED IN YOUR HANDOUT.                Tanya Mueller    Your procedure is scheduled on: 04-08-2019   Report to [facility] Main  Entrance Report to admitting at 7:15 AM      Call this number if you have problems the morning of surgery [phone]    Remember: Do not eat food or drink liquids :After Midnight            . BRUSH YOUR TEETH MORNING OF SURGERY AND RINSE YOUR MOUTH OUT, NO CHEWING GUM CANDY OR MINTS.     Take these medicines the morning of surgery with A SIP OF WATER: Paxil, Amlodipine,Lopressor                               You may not have any metal on your body including hair pins and              piercings  Do not wear jewelry, make-up, lotions, powders or perfumes, deodorant             Do not wear nail polish.  Do not shave  48 hours prior to surgery.               Do not bring valuables to the hospital. [place].  Contacts, dentures or bridgework may not be worn into surgery.  Leave suitcase in the car. After surgery it may be brought to your room.     Patients discharged the day of surgery will not be allowed to drive home. IF YOU ARE HAVING SURGERY AND GOING HOME THE SAME DAY, YOU MUST HAVE AN ADULT TO DRIVE YOU HOME AND BE WITH YOU FOR 24 HOURS. YOU MAY GO HOME BY TAXI OR UBER OR ORTHERWISE, BUT AN ADULT MUST ACCOMPANY YOU HOME AND STAY WITH YOU FOR 24 HOURS.  Name and phone number of your driver:  Special Instructions: N/A              Please read over the following fact sheets you were given: _____________________________________________________________________  [facility] - Preparing for Surgery Before surgery, you can play an important role.  Because skin  is not sterile, your skin needs to be as free of germs as possible.  You can reduce the number of germs on your skin by washing with CHG (chlorahexidine gluconate) soap before surgery.  CHG is an antiseptic cleaner which kills germs and bonds with the skin to continue killing germs even after washing. Please DO NOT use if you have an allergy to CHG or antibacterial soaps.  If your skin becomes reddened/irritated stop using the CHG and inform your nurse when you arrive at Short Stay. Do not shave (including legs and underarms) for at least 48 hours prior to the first CHG shower.  You may shave your face/neck. Please follow these instructions carefully:  1.  Shower with CHG Soap the night before surgery and the  morning of Surgery.  2.  If you choose to wash your hair, wash your hair first as usual with  your  normal  shampoo.  3.  After you shampoo, rinse your hair and body thoroughly to remove the  shampoo.                                        4.  Use CHG as you would any other liquid soap.  You can apply chg directly  to the skin and wash                       Gently with a scrungie or clean washcloth.  5.  Apply the CHG Soap to your body ONLY FROM THE NECK DOWN.   Do not use on face/ open                           Wound or open sores. Avoid contact with eyes, ears mouth and genitals (private parts).                       Wash face,  Genitals (private parts) with your normal soap.             6.  Wash thoroughly, paying special attention to the area where your surgery  will be performed.  7.  Thoroughly rinse your body with warm water from the neck down.  8.  DO NOT shower/wash with your normal soap after using and rinsing off  the CHG Soap.                9.  Pat yourself dry with a clean towel.            10.  Wear clean pajamas.            11.  Place clean sheets on your bed the night of your first shower and do not  sleep with pets. Day of Surgery : Do not apply any lotions/deodorants the  morning of surgery.  Please wear clean clothes to the hospital/surgery center.  FAILURE TO FOLLOW THESE INSTRUCTIONS MAY RESULT IN THE CANCELLATION OF YOUR SURGERY PATIENT SIGNATURE_________________________________  NURSE SIGNATURE__________________________________  ________________________________________________________________________

## 2019-04-01 ENCOUNTER — Encounter (HOSPITAL_COMMUNITY)
Admission: RE | Admit: 2019-04-01 | Discharge: 2019-04-01 | Disposition: A | Discharge status: Home / Self Care | Payer: PPO | Source: Ambulatory Visit | Attending: Urology | Admitting: Urology

## 2019-04-01 ENCOUNTER — Other Ambulatory Visit: Payer: Self-pay

## 2019-04-01 ENCOUNTER — Encounter (HOSPITAL_COMMUNITY): Payer: Self-pay

## 2019-04-01 DIAGNOSIS — I10 Essential (primary) hypertension: Secondary | ICD-10-CM | POA: Diagnosis not present

## 2019-04-01 DIAGNOSIS — Z01812 Encounter for preprocedural laboratory examination: Secondary | ICD-10-CM | POA: Diagnosis not present

## 2019-04-01 DIAGNOSIS — R310 Gross hematuria: Secondary | ICD-10-CM | POA: Diagnosis not present

## 2019-04-01 DIAGNOSIS — F331 Major depressive disorder, recurrent, moderate: Secondary | ICD-10-CM | POA: Diagnosis not present

## 2019-04-01 DIAGNOSIS — Z1159 Encounter for screening for other viral diseases: Secondary | ICD-10-CM | POA: Diagnosis not present

## 2019-04-01 DIAGNOSIS — Z79899 Other long term (current) drug therapy: Secondary | ICD-10-CM | POA: Insufficient documentation

## 2019-04-01 DIAGNOSIS — Z87442 Personal history of urinary calculi: Secondary | ICD-10-CM | POA: Diagnosis not present

## 2019-04-01 DIAGNOSIS — N200 Calculus of kidney: Secondary | ICD-10-CM | POA: Diagnosis not present

## 2019-04-01 HISTORY — DX: Other complications of anesthesia, initial encounter: T88.59XA

## 2019-04-01 HISTORY — DX: Cardiac arrhythmia, unspecified: I49.9

## 2019-04-01 HISTORY — DX: Unspecified osteoarthritis, unspecified site: M19.90

## 2019-04-01 HISTORY — DX: Essential (primary) hypertension: I10

## 2019-04-01 LAB — BASIC METABOLIC PANEL
Anion gap: 8 (ref 5–15)
BUN: 13 mg/dL (ref 8–23)
CO2: 29 mmol/L (ref 22–32)
Calcium: 9.1 mg/dL (ref 8.9–10.3)
Chloride: 105 mmol/L (ref 98–111)
Creatinine, Ser: 0.77 mg/dL (ref 0.44–1.00)
GFR calc Af Amer: >60 mL/min (ref >60)
GFR calc non Af Amer: >60 mL/min (ref >60)
Glucose, Bld: 98 mg/dL (ref 70–99)
Potassium: 4.2 mmol/L (ref 3.5–5.1)
Sodium: 142 mmol/L (ref 135–145)

## 2019-04-01 LAB — CBC
HCT: 40.6 % (ref 36.0–46.0)
Hemoglobin: 13.2 g/dL (ref 12.0–15.0)
MCH: 33.9 pg (ref 26.0–34.0)
MCHC: 32.5 g/dL (ref 30.0–36.0)
MCV: 104.4 fL — ABNORMAL HIGH (ref 80.0–100.0)
Platelets: 258 10*3/uL (ref 150–400)
RBC: 3.89 MIL/uL (ref 3.87–5.11)
RDW: 12.6 % (ref 11.5–15.5)
WBC: 6.5 10*3/uL (ref 4.0–10.5)
nRBC: 0 % (ref 0.0–0.2)

## 2019-04-02 NOTE — Progress Notes (Signed)
Anesthesia Chart Review   Case: [id number] Date/Time: 04/08/19 0900   Procedure: CYSTOSCOPY WITH BIOPSY, FULGURATION (N/A )   Anesthesia type: General   Pre-op diagnosis: BLADDER ERYTHEMA, GROSS HEMATURIA, LEFT RENAL STONE   Location: [place] [facility]   Surgeon: Festus Aloe, MD      DISCUSSION:84 y.o. never smoker with h/o A-fib (epidose in 2013 converted to sinus with cardizem, no episodes since), HTN, h/o awareness during general anesthesia, bladder erythema, gross hematuria, left renal stone scheduled for above procedure 04/08/19 with Dr. Festus Aloe.   Pt last seen by cardiologist, Dr. Kate Sable, 10/01/18 regarding HTN and palpitations.  Stable overall.  Holter monitor with PACs and isolated PVCs.  Continued on metoprolol 25 mg. HTN stable.  1 year follow up recommended.    Anticipate pt can proceed with planned procedure barring acute status change.   VS: BP (!) 143/68 (BP Location: Left Arm)   Pulse 72   Temp 36.8 C (Oral)   Resp 18   Ht 5\' 6"  (1.676 m)   Wt 69.4 kg   SpO2 99%   BMI 24.69 kg/m   PROVIDERS: Sasser, Silvestre Moment, MD is PCP   Kate Sable, MD is Cardiologist  LABS: Labs reviewed: Acceptable for surgery. (all labs ordered are listed, but only abnormal results are displayed)  Labs Reviewed  CBC - Abnormal; Notable for the following components:      Result Value   MCV 104.4 (*)    All other components within normal limits  BASIC METABOLIC PANEL     IMAGES:   EKG: 03/26/18 Rate 54 bpm Sinus bradycardia with 1st degree AV block  Septal infarct, age undetermined   CV: Echo 04/29/17 Study Conclusions  - Left ventricle: The cavity size was normal. Wall thickness was   increased in a pattern of mild LVH. Systolic function was normal.   The estimated ejection fraction was in the range of 55% to 60%.   Wall motion was normal; there were no regional wall motion   abnormalities. Left ventricular diastolic function parameters   were  normal for the patient&'s age. - Aortic valve: Mildly calcified annulus. Trileaflet. - Mitral valve: Calcified annulus. There was trivial regurgitation. - Right atrium: Central venous pressure (est): 3 mm Hg. - Atrial septum: No defect or patent foramen ovale was identified. - Tricuspid valve: There was trivial regurgitation. - Pulmonary arteries: PA peak pressure: 26 mm Hg (S). - Pericardium, extracardiac: There was no pericardial effusion.  Impressions:  - Mild LVH with LVEF 55-60%. Grossly normal diastolic function.   Mild calcified mitral annulus with trivial mitral regurgitation.   Mild calcified aortic annulus. Trivial tricuspid regurgitation   with PASP 26 mmHg. Past Medical History:  Diagnosis Date  . Allergic rhinitis   . Arthritis    Back ,hips  . Complication of anesthesia    awareness during general anesthesia.  [name] Dysrhythmia    A-Fib and PSVT  . Frequency of urination   . History of atrial fibrillation    episode 2013 converted with cardizem-- happened at time acute illness due to kidney stone --  resolved  no issues since  . History of kidney stones   . History of sepsis    urosepsis  2013 due to kidney stone  . Hypertension   . Intermittent palpitations    controlled w/ atenolol  . Left nephrolithiasis   . Left ureteral calculus   . PAC (premature atrial contraction)   . Palpitations   .  Solitary lung nodule    benign right middle lobe per CT  . Urgency of urination   . Wears dentures    upper  . Wears partial dentures    lower    Past Surgical History:  Procedure Laterality Date  . CATARACT EXTRACTION W/ INTRAOCULAR LENS  IMPLANT, BILATERAL  2010  . CYSTOSCOPY WITH STENT PLACEMENT Left 09/15/2014   Procedure: CYSTOSCOPY WITH STENT PLACEMENT;  Surgeon: Georgette Dover, MD;  [facility];  Service: Urology;  Laterality: Left;  . CYSTOSCOPY/RETROGRADE/URETEROSCOPY Left 08/09/2014   Procedure: CYSTOSCOPY/RETROGRADE/LEFT  STENT;  Surgeon: Bernestine Amass, MD;  Location: [facility];  Service: Urology;  Laterality: Left;  . EXTRACORPOREAL SHOCK WAVE LITHOTRIPSY  2013  . HOLMIUM LASER APPLICATION Left 41/93/7902   Procedure: HOLMIUM LASER APPLICATION;  Surgeon: Georgette Dover, MD;  Location: [facility];  Service: Urology;  Laterality: Left;  . LAPAROTOMY W/  UNILATERAL SALPINGOOPHORECTOMY  age 59  . TOTAL ABDOMINAL HYSTERECTOMY  age 87   w/ unilateral salpingoophorectomy  . TRANSTHORACIC ECHOCARDIOGRAM  02-13-2012   mild LVH/  ef 65%/  trivial MR  &  PR/  mild RVE  . URETEROSCOPY Left 09/15/2014   Procedure: URETEROSCOPY;  Surgeon: Georgette Dover, MD;  Location: [facility];  Service: Urology;  Laterality: Left;    MEDICATIONS: . amLODipine (NORVASC) 5 MG tablet  . fluticasone (FLONASE) 50 MCG/ACT nasal spray  . losartan (COZAAR) 100 MG tablet  . methenamine (MANDELAMINE) 1 g tablet  . metoprolol tartrate (LOPRESSOR) 25 MG tablet  . nitrofurantoin (MACRODANTIN) 100 MG capsule  . oxyCODONE-acetaminophen (PERCOCET) 7.5-325 MG tablet  . PARoxetine (PAXIL) 20 MG tablet  . sodium chloride (OCEAN) 0.65 % SOLN nasal spray   No current facility-administered medications for this encounter.    Maia Plan [facility] Pre-Surgical Testing [phone] 04/02/19  11:13 AM

## 2019-04-02 NOTE — Anesthesia Preprocedure Evaluation (Addendum)
Anesthesia Evaluation  Patient identified by MRN, date of birth, ID band Patient awake    Reviewed: Allergy & Precautions, NPO status , Patient's Chart, lab work & pertinent test results  History of Anesthesia Complications (+) history of anesthetic complications  Airway Mallampati: II  TM Distance: >3 FB Neck ROM: Full    Dental  (+) Dental Advisory Given, Edentulous Upper, Edentulous Lower   Pulmonary shortness of breath,    Pulmonary exam normal breath sounds clear to auscultation       Cardiovascular hypertension, Pt. on medications and Pt. on home beta blockers Normal cardiovascular exam+ dysrhythmias  Rhythm:Regular Rate:Normal     Neuro/Psych negative neurological ROS  negative psych ROS   GI/Hepatic negative GI ROS, Neg liver ROS,   Endo/Other  negative endocrine ROS  Renal/GU Renal disease  negative genitourinary   Musculoskeletal  (+) Arthritis ,   Abdominal   Peds negative pediatric ROS (+)  Hematology negative hematology ROS (+)   Anesthesia Other Findings   Reproductive/Obstetrics negative OB ROS                                                          Anesthesia Evaluation  Patient identified by MRN, date of birth, ID band Patient awake    Reviewed: Allergy & Precautions, H&P , NPO status , Patient's Chart, lab work & pertinent test results, reviewed documented beta blocker date and time   Airway Mallampati: II  TM Distance: >3 FB Neck ROM: full    Dental  (+) Missing, Dental Advisory Given, Edentulous Upper Lower partial:   Pulmonary neg pulmonary ROS,  breath sounds clear to auscultation  Pulmonary exam normal       Cardiovascular Exercise Tolerance: Good + dysrhythmias Atrial Fibrillation Rhythm:regular Rate:Normal  ECG NSR   Neuro/Psych negative neurological ROS  negative psych ROS   GI/Hepatic negative GI ROS, Neg liver ROS,   Endo/Other   negative endocrine ROS  Renal/GU negative Renal ROS  negative genitourinary   Musculoskeletal   Abdominal   Peds  Hematology negative hematology ROS (+)   Anesthesia Other Findings   Reproductive/Obstetrics negative OB ROS                            Anesthesia Physical Anesthesia Plan  ASA: III  Anesthesia Plan: General   Post-op Pain Management:    Induction: Intravenous  Airway Management Planned: LMA  Additional Equipment:   Intra-op Plan:   Post-operative Plan:   Informed Consent:   Plan Discussed with: Surgeon  Anesthesia Plan Comments:         Anesthesia Quick Evaluation  Anesthesia Physical Anesthesia Plan  ASA: III  Anesthesia Plan: General   Post-op Pain Management:    Induction: Intravenous  PONV Risk Score and Plan: 4 or greater and Ondansetron, Dexamethasone and Treatment may vary due to age or medical condition  Airway Management Planned: LMA  Additional Equipment:   Intra-op Plan:   Post-operative Plan: Extubation in OR  Informed Consent: I have reviewed the patients History and Physical, chart, labs and discussed the procedure including the risks, benefits and alternatives for the proposed anesthesia with the patient or authorized representative who has indicated his/her understanding and acceptance.     Dental advisory given  Plan Discussed  with: CRNA  Anesthesia Plan Comments: (See PAT note 04/01/2019, Konrad Felix, PA-C)       Anesthesia Quick Evaluation

## 2019-04-04 ENCOUNTER — Other Ambulatory Visit (HOSPITAL_COMMUNITY)
Admission: RE | Admit: 2019-04-04 | Discharge: 2019-04-04 | Disposition: A | Discharge status: Home / Self Care | Payer: PPO | Source: Ambulatory Visit | Attending: Urology | Admitting: Urology

## 2019-04-04 DIAGNOSIS — Z01812 Encounter for preprocedural laboratory examination: Secondary | ICD-10-CM | POA: Diagnosis not present

## 2019-04-04 LAB — SARS CORONAVIRUS 2 (TAT 6-24 HRS): SARS Coronavirus 2: NEGATIVE

## 2019-04-07 NOTE — H&P (Addendum)
Office Visit Report     03/18/2019   --------------------------------------------------------------------------------   Tanya Mueller  MRN: [id number]  PRIMARY CARE:  Consuello Masse, MD  DOB: 08-06-1935, 84 year old Female  REFERRING:  Consuello Masse, MD  SSN: -[id number]  PROVIDER:  Festus Aloe, M.D.    LOCATION:  [facility] [phone]   --------------------------------------------------------------------------------   CC: I have blood in my urine.  HPI: Tanya Mueller is a 84 year-old female established patient who is here for blood in the urine.  She did see the blood in her urine. She has not seen blood clots.   She does have a burning sensation when she urinates.   Her last U/S or CT Scan was 03/04/2019.   She noted red urine May 2020. CT scan of the abdomen and pelvis March 04, 2019 was benign. There was a stable 10 mm left lower pole stone.   She has not had further gross hematuria or dysuria. I treated her with nitrofurantoin twice a day and now daily at bedtime.     ALLERGIES: No Allergies    MEDICATIONS: Methenamine Hippurate 1 gram tablet 1/2 tablet PO BID  Metoprolol Tartrate 25 mg tablet  Amlodipine Besylate 5 mg tablet  Hydrocodone-Acetaminophen  Losartan Potassium 100 mg tablet  Methenamine Hippurate 1 gram tablet 1/2 tablet PO BID  Multi-Vitamin TABS Oral  Nitrofurantoin 100 mg capsule 1 capsule po BID for three days and then one capsule po QHS  Oxycodone-Acetaminophen 7.5 mg-325 mg tablet tablet  Paroxetine Hcl 20 mg tablet     GU PSH: Cysto Uretero Lithotripsy - 2015 Cystoscopy Insert Stent - 2015, 2015 ESWL - 2013, 2013, 2013 Hysterectomy Unilat SO - 2013 Locm 300-399Mg /Ml Iodine,1Ml - 03/04/2019 Ovary Removal Partial or Total - 2013 Ureteroscopic stone removal - 2013      [place] Notes: Cystoscopy With Ureteroscopy With Lithotripsy, Cystoscopy With Insertion Of Ureteral Stent Left, Cystoscopy With Insertion Of Ureteral Stent Left,  Lithotripsy, Cataract Surgery, Lithotripsy, Lithotripsy, Cystoscopy With Ureteroscopy With Manipulation Of Calculus, Oophorectomy - Unilateral (Removal Of One Ovary), Hysterectomy, Incisional Breast Biopsy   NON-GU PSH: Back surgery Breast Biopsy - 2013    GU PMH: Gross hematuria - 02/26/2019 Renal calculus - 02/26/2019, - 05/08/2018, - 11/21/2017, - 2018, Nephrolithiasis, - 2017 Urinary Frequency - 05/08/2018 Chronic cystitis (w/o hematuria) - 04/10/2017 Urinary Urgency, Urinary urgency - 2017 Urinary Tract Inf, Unspec site, Urinary tract infection - 2015 Ureteral calculus, Calculus of ureter - 2015 History of urolithiasis, Nephrolithiasis - 2014    NON-GU PMH: Bacteriuria (Stable) - 2018, Bacteriuria, asymptomatic, - 2017 Encounter for general adult medical examination without abnormal findings, Encounter for preventive health examination - 2017 Fever, unspecified, Fever - 2015 Personal history of other diseases of the circulatory system, History of hypertension - 2014    FAMILY HISTORY: Bladder Cancer - Brother Blood In Urine - Runs In Family Bone Marrow Lymphoma - Brother Family Health Status Number - Runs In Family Father Deceased At Age70 ___ - Runs In Family Mother Deceased At Age 78 from diabetic complicati - Runs In Family   SOCIAL HISTORY: Marital Status: Married Preferred Language: English; Race: White Current Smoking Status: Patient has never smoked.   Tobacco Use Assessment Completed: Used Tobacco in last 30 days? Does not drink caffeine.     Notes: Caffeine Use, Never A Smoker, Marital History - Currently Married, Alcohol Use, Retired From Work   REVIEW OF SYSTEMS:    GU Review Female:   Patient denies frequent  urination, hard to postpone urination, burning /pain with urination, get up at night to urinate, leakage of urine, stream starts and stops, trouble starting your stream, have to strain to urinate, and being pregnant.  Gastrointestinal (Upper):   Patient denies  nausea, indigestion/ heartburn, and vomiting.  Gastrointestinal (Lower):   Patient denies diarrhea and constipation.  Constitutional:   Patient denies fever, night sweats, weight loss, and fatigue.  Skin:   Patient denies skin rash/ lesion and itching.  Eyes:   Patient denies blurred vision and double vision.  Ears/ Nose/ Throat:   Patient denies sore throat and sinus problems.  Hematologic/Lymphatic:   Patient denies swollen glands and easy bruising.  Cardiovascular:   Patient denies leg swelling and chest pains.  Respiratory:   Patient denies cough and shortness of breath.  Endocrine:   Patient denies excessive thirst.  Musculoskeletal:   Patient denies back pain and joint pain.  Neurological:   Patient denies headaches and dizziness.  Psychologic:   Patient denies depression and anxiety.   VITAL SIGNS:      03/18/2019 10:56 AM  BP 182/70 mmHg  Pulse 58 /min  Temperature 98.1 F / 36.7 C   GU PHYSICAL EXAMINATION:    External Genitalia: No hirsutism, no rash, no scarring, no cyst, no erythematous lesion, no papular lesion, no blanched lesion, no warty lesion. No edema.  Urethral Meatus: Normal size. Normal position. No discharge.  Urethra: No tenderness, no mass, no scarring. No hypermobility. No leakage.  Bladder: Normal to palpation, no tenderness, no mass, normal size.   MULTI-SYSTEM PHYSICAL EXAMINATION:    Constitutional: Well-nourished. No physical deformities. Normally developed. Good grooming.  Neck: Neck symmetrical, not swollen. Normal tracheal position.  Respiratory: No labored breathing, no use of accessory muscles.   Cardiovascular: Normal temperature, normal extremity pulses, no swelling, no varicosities.  Neurologic / Psychiatric: Oriented to time, oriented to place, oriented to person. No depression, no anxiety, no agitation.  Gastrointestinal: No mass, no tenderness, no rigidity, non obese abdomen.     PAST DATA REVIEWED:  Source Of History:  Patient    PROCEDURES:         Flexible Cystoscopy - 52000  Risks, benefits, and some of the potential complications of the procedure were discussed at length with the patient including infection, bleeding, voiding discomfort, urinary retention, fever, chills, sepsis, and others. All questions were answered. Informed consent was obtained. Antibiotic prophylaxis was given - she is on NF. Sterile technique and intraurethral analgesia were used. Chaperone - Lauren - for exam and cystoscopy.   Meatus:  Normal size. Normal location. Normal condition.  Urethra:  No hypermobility. No leakage.  Ureteral Orifices:  Normal location. Normal size. Normal shape. Effluxed clear urine.  Bladder:  Erythematous mucosa - posterior, inferior - almost fine papillary in appearance. No trabeculation. No tumors. No stones.      The lower urinary tract was carefully examined. The procedure was well-tolerated and without complications. Antibiotic instructions were given. Instructions were given to call the office immediately for bloody urine, difficulty urinating, urinary retention, painful or frequent urination, fever, chills, nausea, vomiting or other illness. The patient stated that she understood these instructions and would comply with them.         Urinalysis w/Scope Dipstick Dipstick Cont'd Micro  Color: Yellow Bilirubin: Neg mg/dL WBC/hpf: 10 - 20/hpf  Appearance: Cloudy Ketones: Neg mg/dL RBC/hpf: 3 - 10/hpf  Specific Gravity: <=1.005 Blood: 3+ ery/uL Bacteria: Many (>50/hpf)  pH: 7.5 Protein: Neg mg/dL Cystals: NS (  Not Seen)  Glucose: Neg mg/dL Urobilinogen: 0.2 mg/dL Casts: NS (Not Seen)    Nitrites: Positive Trichomonas: Not Present    Leukocyte Esterase: 3+ leu/uL Mucous: Not Present      Epithelial Cells: NS (Not Seen)      Yeast: NS (Not Seen)      Sperm: Not Present    ASSESSMENT:      ICD-10 Details  1 GU:   Gross hematuria - R31.0   2   Renal calculus - N20.0    PLAN:           Orders Labs Urine  Culture          Schedule Return Visit/Planned Activity: Next Available Appointment - Schedule Surgery          Document Letter(s):  Created for Patient: Clinical Summary         Notes:   Gross hematuria-I discussed the findings with the patient and the nature risks benefits and alternatives to cystoscopy with bladder biopsy and she elects to proceed.   Left renal calculus-we discussed the nature risk and benefits of ureteroscopy and laser lithotripsy especially while she is in the OR for bladder biopsy. All questions answered and she elects NOT to proceed. We talked about ureteral stent. She does not want to approach the stone right now.   Cc: Dr. Quintin Alto    * Signed by Festus Aloe, M.D. on 03/19/19 at 10:13 AM (EDT)*     The information contained in this medical record document is considered private and confidential patient information. This information can only be used for the medical diagnosis and/or medical services that are being provided by the patient's selected caregivers. This information can only be distributed outside of the patient's care if the patient agrees and signs waivers of authorization for this information to be sent to an outside source or route.  Addendum; her urine cx from office was > 3 organism, mixed growth

## 2019-04-08 ENCOUNTER — Ambulatory Visit (HOSPITAL_COMMUNITY)
Admission: RE | Admit: 2019-04-08 | Discharge: 2019-04-08 | Disposition: A | Discharge status: Home / Self Care | Payer: PPO | Source: Ambulatory Visit | Attending: Urology | Admitting: Urology

## 2019-04-08 ENCOUNTER — Encounter (HOSPITAL_COMMUNITY): Payer: Self-pay

## 2019-04-08 ENCOUNTER — Ambulatory Visit (HOSPITAL_COMMUNITY): Payer: PPO | Admitting: Anesthesiology

## 2019-04-08 ENCOUNTER — Ambulatory Visit (HOSPITAL_COMMUNITY): Payer: PPO | Admitting: Physician Assistant

## 2019-04-08 ENCOUNTER — Encounter (HOSPITAL_COMMUNITY)
Admission: RE | Disposition: A | Discharge status: Home / Self Care | Payer: Self-pay | Source: Ambulatory Visit | Attending: Urology

## 2019-04-08 DIAGNOSIS — R310 Gross hematuria: Secondary | ICD-10-CM | POA: Diagnosis not present

## 2019-04-08 DIAGNOSIS — N202 Calculus of kidney with calculus of ureter: Secondary | ICD-10-CM | POA: Diagnosis not present

## 2019-04-08 DIAGNOSIS — N3031 Trigonitis with hematuria: Secondary | ICD-10-CM | POA: Diagnosis not present

## 2019-04-08 DIAGNOSIS — N3011 Interstitial cystitis (chronic) with hematuria: Secondary | ICD-10-CM | POA: Diagnosis not present

## 2019-04-08 DIAGNOSIS — N200 Calculus of kidney: Secondary | ICD-10-CM | POA: Insufficient documentation

## 2019-04-08 DIAGNOSIS — N3289 Other specified disorders of bladder: Secondary | ICD-10-CM

## 2019-04-08 DIAGNOSIS — Z79899 Other long term (current) drug therapy: Secondary | ICD-10-CM | POA: Diagnosis not present

## 2019-04-08 DIAGNOSIS — I10 Essential (primary) hypertension: Secondary | ICD-10-CM | POA: Diagnosis not present

## 2019-04-08 DIAGNOSIS — Z87442 Personal history of urinary calculi: Secondary | ICD-10-CM | POA: Diagnosis not present

## 2019-04-08 DIAGNOSIS — I4891 Unspecified atrial fibrillation: Secondary | ICD-10-CM | POA: Diagnosis not present

## 2019-04-08 HISTORY — PX: CYSTOSCOPY WITH BIOPSY: SHX5122

## 2019-04-08 SURGERY — CYSTOSCOPY, WITH BIOPSY
Anesthesia: General

## 2019-04-08 MED ORDER — MEPERIDINE HCL 50 MG/ML IJ SOLN: 6.2500 mg | INTRAMUSCULAR | Status: DC | PRN

## 2019-04-08 MED ORDER — ONDANSETRON HCL 4 MG/2ML IJ SOLN: 4.0000 mg | Freq: Once | INTRAMUSCULAR | Status: DC | PRN

## 2019-04-08 MED ORDER — BELLADONNA ALKALOIDS-OPIUM 16.2-30 MG RE SUPP
RECTAL | Status: AC
  Filled 2019-04-08: qty 1

## 2019-04-08 MED ORDER — PROPOFOL 10 MG/ML IV BOLUS
INTRAVENOUS | Status: AC
  Filled 2019-04-08: qty 20

## 2019-04-08 MED ORDER — LIDOCAINE HCL URETHRAL/MUCOSAL 2 % EX GEL
CUTANEOUS | Status: AC
  Filled 2019-04-08: qty 5

## 2019-04-08 MED ORDER — LIDOCAINE HCL (CARDIAC) PF 50 MG/5ML IV SOSY
PREFILLED_SYRINGE | INTRAVENOUS | Status: DC | PRN
  Administered 2019-04-08: 75 mg via INTRAVENOUS

## 2019-04-08 MED ORDER — LIDOCAINE HCL URETHRAL/MUCOSAL 2 % EX GEL
CUTANEOUS | Status: DC | PRN
  Administered 2019-04-08: 1

## 2019-04-08 MED ORDER — DEXAMETHASONE SODIUM PHOSPHATE 10 MG/ML IJ SOLN
INTRAMUSCULAR | Status: DC | PRN
  Administered 2019-04-08: 6 mg via INTRAVENOUS

## 2019-04-08 MED ORDER — FENTANYL CITRATE (PF) 100 MCG/2ML IJ SOLN
INTRAMUSCULAR | Status: DC | PRN
  Administered 2019-04-08 (×2): 50 ug via INTRAVENOUS

## 2019-04-08 MED ORDER — STERILE WATER FOR IRRIGATION IR SOLN
Status: DC | PRN
  Administered 2019-04-08: 6000 mL

## 2019-04-08 MED ORDER — LACTATED RINGERS IV SOLN
INTRAVENOUS | Status: DC
  Administered 2019-04-08: 08:00:00 via INTRAVENOUS

## 2019-04-08 MED ORDER — CEFAZOLIN SODIUM-DEXTROSE 2-4 GM/100ML-% IV SOLN
2.0000 g | Freq: Once | INTRAVENOUS | Status: AC
  Administered 2019-04-08: 2 g via INTRAVENOUS
  Filled 2019-04-08: qty 100

## 2019-04-08 MED ORDER — FENTANYL CITRATE (PF) 100 MCG/2ML IJ SOLN: 25.0000 ug | INTRAMUSCULAR | Status: DC | PRN

## 2019-04-08 MED ORDER — FENTANYL CITRATE (PF) 100 MCG/2ML IJ SOLN
INTRAMUSCULAR | Status: AC
  Filled 2019-04-08: qty 2

## 2019-04-08 MED ORDER — PROPOFOL 10 MG/ML IV BOLUS
INTRAVENOUS | Status: DC | PRN
  Administered 2019-04-08: 130 mg via INTRAVENOUS

## 2019-04-08 MED ORDER — ONDANSETRON HCL 4 MG/2ML IJ SOLN
INTRAMUSCULAR | Status: DC | PRN
  Administered 2019-04-08: 4 mg via INTRAVENOUS

## 2019-04-08 MED ORDER — ONDANSETRON HCL 4 MG/2ML IJ SOLN
INTRAMUSCULAR | Status: AC
  Filled 2019-04-08: qty 2

## 2019-04-08 MED ORDER — DEXAMETHASONE SODIUM PHOSPHATE 10 MG/ML IJ SOLN
INTRAMUSCULAR | Status: AC
  Filled 2019-04-08: qty 1

## 2019-04-08 SURGICAL SUPPLY — 12 items
BAG URINE DRAINAGE (UROLOGICAL SUPPLIES) IMPLANT
BAG URO CATCHER STRL LF (MISCELLANEOUS) ×3 IMPLANT
COVER WAND RF STERILE (DRAPES) IMPLANT
GLOVE BIOGEL M STRL SZ7.5 (GLOVE) ×3 IMPLANT
GOWN STRL REUS W/TWL XL LVL3 (GOWN DISPOSABLE) ×3 IMPLANT
KIT TURNOVER KIT A (KITS) IMPLANT
LOOP CUT BIPOLAR 24F LRG (ELECTROSURGICAL) IMPLANT
MANIFOLD NEPTUNE II (INSTRUMENTS) ×3 IMPLANT
PACK CYSTO (CUSTOM PROCEDURE TRAY) ×3 IMPLANT
TUBING CONNECTING 10 (TUBING) ×2 IMPLANT
TUBING CONNECTING 10' (TUBING) ×1
TUBING UROLOGY SET (TUBING) ×3 IMPLANT

## 2019-04-08 NOTE — Discharge Instructions (Signed)
Cystoscopy Cystoscopy is a procedure that is used to help diagnose and sometimes treat conditions that affect the lower urinary tract. The lower urinary tract includes the bladder and the urethra. The urethra is the tube that drains urine from the bladder. Cystoscopy is done using a thin, tube-shaped instrument with a light and camera at the end (cystoscope). The cystoscope may be hard or flexible, depending on the goal of the procedure. The cystoscope is inserted through the urethra, into the bladder. Cystoscopy may be recommended if you have:  Urinary tract infections that keep coming back.  Blood in the urine (hematuria).  An inability to control when you urinate (urinary incontinence) or an overactive bladder.  Unusual cells found in a urine sample.  A blockage in the urethra, such as a urinary stone.  Painful urination.  An abnormality in the bladder found during an intravenous pyelogram (IVP) or CT scan. Cystoscopy may also be done to remove a sample of tissue to be examined under a microscope (biopsy). Tell a health care provider about:  Any allergies you have.  All medicines you are taking, including vitamins, herbs, eye drops, creams, and over-the-counter medicines.  Any problems you or family members have had with anesthetic medicines.  Any blood disorders you have.  Any surgeries you have had.  Any medical conditions you have.  Whether you are pregnant or may be pregnant. What are the risks? Generally, this is a safe procedure. However, problems may occur, including:  Infection.  Bleeding.  Allergic reactions to medicines.  Damage to other structures or organs. What happens before the procedure?  Ask your health care provider about: ? Changing or stopping your regular medicines. This is especially important if you are taking diabetes medicines or blood thinners. ? Taking medicines such as aspirin and ibuprofen. These medicines can thin your blood. Do not take  these medicines unless your health care provider tells you to take them. ? Taking over-the-counter medicines, vitamins, herbs, and supplements.  Follow instructions from your health care provider about eating or drinking restrictions.  Ask your health care provider what steps will be taken to help prevent infection. These may include: ? Washing skin with a germ-killing soap. ? Taking antibiotic medicine.  You may have an exam or testing, such as: ? X-rays of the bladder, urethra, or kidneys. ? Urine tests to check for signs of infection.  Plan to have someone take you home from the hospital or clinic. What happens during the procedure?   You will be given one or more of the following: ? A medicine to help you relax (sedative). ? A medicine to numb the area (local anesthetic).  The area around the opening of your urethra will be cleaned.  The cystoscope will be passed through your urethra into your bladder.  Germ-free (sterile) fluid will flow through the cystoscope to fill your bladder. The fluid will stretch your bladder so that your health care provider can clearly examine your bladder walls.  Your doctor will look at the urethra and bladder. Your doctor may take a biopsy or remove stones.  The cystoscope will be removed, and your bladder will be emptied. The procedure may vary among health care providers and hospitals. What can I expect after the procedure? After the procedure, it is common to have:  Some soreness or pain in your abdomen and urethra.  Urinary symptoms. These include: ? Mild pain or burning when you urinate. Pain should stop within a few minutes after you urinate. This   may last for up to 1 week. ? A small amount of blood in your urine for several days. ? Feeling like you need to urinate but producing only a small amount of urine. Follow these instructions at home: Medicines  Take over-the-counter and prescription medicines only as told by your health care  provider.  If you were prescribed an antibiotic medicine, take it as told by your health care provider. Do not stop taking the antibiotic even if you start to feel better. General instructions  Return to your normal activities as told by your health care provider. Ask your health care provider what activities are safe for you.  Do not drive for 24 hours if you were given a sedative during your procedure.  Watch for any blood in your urine. If the amount of blood in your urine increases, call your health care provider.  Follow instructions from your health care provider about eating or drinking restrictions.  If a tissue sample was removed for testing (biopsy) during your procedure, it is up to you to get your test results. Ask your health care provider, or the department that is doing the test, when your results will be ready.  Drink enough fluid to keep your urine pale yellow.  Keep all follow-up visits as told by your health care provider. This is important. Contact a health care provider if you:  Have pain that gets worse or does not get better with medicine, especially pain when you urinate.  Have trouble urinating.  Have more blood in your urine. Get help right away if you:  Have blood clots in your urine.  Have abdominal pain.  Have a fever or chills.  Are unable to urinate. Summary  Cystoscopy is a procedure that is used to help diagnose and sometimes treat conditions that affect the lower urinary tract.  Cystoscopy is done using a thin, tube-shaped instrument with a light and camera at the end.  After the procedure, it is common to have some soreness or pain in your abdomen and urethra.  Watch for any blood in your urine. If the amount of blood in your urine increases, call your health care provider.  If you were prescribed an antibiotic medicine, take it as told by your health care provider. Do not stop taking the antibiotic even if you start to feel better. This  information is not intended to replace advice given to you by your health care provider. Make sure you discuss any questions you have with your health care provider. Document Released: 09/15/2000 Document Revised: 09/10/2018 Document Reviewed: 09/10/2018 Elsevier Patient Education  2020 [employer]  

## 2019-04-08 NOTE — Anesthesia Procedure Notes (Signed)
Procedure Name: LMA Insertion Date/Time: 04/08/2019 10:25 AM Performed by: Lissa Morales, CRNA Pre-anesthesia Checklist: Patient identified, Emergency Drugs available, Suction available and Patient being monitored Patient Re-evaluated:Patient Re-evaluated prior to induction Oxygen Delivery Method: Circle system utilized Preoxygenation: Pre-oxygenation with 100% oxygen Induction Type: IV induction LMA Size: 4.0 Tube type: Oral Number of attempts: 1 Airway Equipment and Method: Stylet and Oral airway Placement Confirmation: positive ETCO2 Tube secured with: Tape Dental Injury: Teeth and Oropharynx as per pre-operative assessment

## 2019-04-08 NOTE — Interval H&P Note (Signed)
History and Physical Interval Note:  04/08/2019 9:59 AM  Tanya Mueller  has presented today for surgery, with the diagnosis of BLADDER ERYTHEMA, GROSS HEMATURIA, LEFT RENAL STONE.  The various methods of treatment have been discussed with the patient and family. After consideration of risks, benefits and other options for treatment, the patient has consented to  Procedure(s): CYSTOSCOPY WITH BIOPSY, FULGURATION (N/A) as a surgical intervention.  The patient's history has been reviewed, patient examined, no change in status, stable for surgery.  I have reviewed the patient's chart and labs.  Questions were answered to the patient's satisfaction.  No dysuria or fever.    Festus Aloe

## 2019-04-08 NOTE — Op Note (Signed)
Preoperative diagnosis: Gross hematuria, bladder erythema Postoperative diagnosis: Same  Procedure: Cystoscopy with bladder biopsy and fulguration 0.5 to 2 cm  Surgeon: Junious Silk  Anesthesia: General  Indication for procedure: Tanya Mueller is an 84 year old female who had gross hematuria.  CT scan of the abdomen and pelvis with delayed imaging was benign but on office cystoscopy she had diffuse erythema especially left inferior that almost look papillary.  She was brought for biopsy today.  Findings: On exam under anesthesia there is moderate to severe vaginal atrophy.  The introitus and vulva appeared normal.  The vagina, urethra, bladder and cuff were palpably normal.  The meatus appeared normal.  On cystoscopy the urethra was unremarkable, and the bladder had diffuse circular pattern erythema especially left inferior but that spread over the entire bladder and decreased in density especially toward the right and toward the dome.  The trigone and ureteral orifice ease appeared normal.  There was clear efflux bilaterally.  Description of procedure: After consent was obtained patient brought to the operating room.  After adequate anesthesia she is placed in lithotomy position and prepped and draped in the usual sterile fashion.  A timeout was performed to confirm the patient and procedure.  An exam under anesthesia was performed.  The cystoscope was passed per urethra and I inspected the bladder with a 30 and 70 degree lens.  The most dense representation of the lesions was left inferior and this was biopsied x2 with the rigid cold cup forceps.  The area was fulgurated total of about 1.5 cm.  Excellent hemostasis at low pressure.  Felt like this area was representative of what was going on diffusely in the bladder and in other areas of the bladder it was much less dense with mostly normal bladder mucosa.  The bladder was drained and the scope removed.  I instilled lidocaine jelly per urethra.  She was  awakened and taken to the recovery room in stable condition.  Complications: None  Blood loss: Minimal  Specimens to pathology: Left inferior bladder biopsy x2 (sent in the same cup)  Drains: None  Disposition: Patient stable to PACU

## 2019-04-08 NOTE — Anesthesia Postprocedure Evaluation (Signed)
Anesthesia Post Note  Patient: Tanya Mueller  Procedure(s) Performed: CYSTOSCOPY WITH bladder BIOPSY, FULGURATION (N/A )     Patient location during evaluation: PACU Anesthesia Type: General Level of consciousness: sedated and patient cooperative Pain management: pain level controlled Vital Signs Assessment: post-procedure vital signs reviewed and stable Respiratory status: spontaneous breathing Cardiovascular status: stable Anesthetic complications: no    Last Vitals:  Vitals:   04/08/19 1124 04/08/19 1145  BP: (!) 162/74 (!) 170/73  Pulse: 66 64  Resp: 14 16  Temp: 36.7 C 36.7 C  SpO2: 96% 94%    Last Pain:  Vitals:   04/08/19 1145  TempSrc:   PainSc: 0-No pain                 Nolon Nations

## 2019-04-08 NOTE — Transfer of Care (Signed)
Immediate Anesthesia Transfer of Care Note  Patient: Tanya Mueller  Procedure(s) Performed: CYSTOSCOPY WITH bladder BIOPSY, FULGURATION (N/A )  Patient Location: PACU  Anesthesia Type:General  Level of Consciousness: awake, alert , oriented and patient cooperative  Airway & Oxygen Therapy: Patient Spontanous Breathing and Patient connected to face mask oxygen  Post-op Assessment: Report given to RN, Post -op Vital signs reviewed and stable and Patient moving all extremities X 4  Post vital signs: stable  Last Vitals:  Vitals Value Taken Time  BP 164/73 04/08/19 1100  Temp    Pulse 66 04/08/19 1102  Resp 16 04/08/19 1102  SpO2 100 % 04/08/19 1102  Vitals shown include unvalidated device data.  Last Pain:  Vitals:   04/08/19 0749  TempSrc:   PainSc: 0-No pain         Complications: No apparent anesthesia complications

## 2019-04-09 ENCOUNTER — Encounter (HOSPITAL_COMMUNITY): Payer: Self-pay | Admitting: Urology

## 2019-04-17 DIAGNOSIS — N3020 Other chronic cystitis without hematuria: Secondary | ICD-10-CM | POA: Diagnosis not present

## 2019-04-17 DIAGNOSIS — N200 Calculus of kidney: Secondary | ICD-10-CM | POA: Diagnosis not present

## 2019-04-17 DIAGNOSIS — R310 Gross hematuria: Secondary | ICD-10-CM | POA: Diagnosis not present

## 2019-04-24 ENCOUNTER — Other Ambulatory Visit: Payer: Self-pay | Admitting: Cardiovascular Disease

## 2019-05-21 DIAGNOSIS — M47816 Spondylosis without myelopathy or radiculopathy, lumbar region: Secondary | ICD-10-CM | POA: Diagnosis not present

## 2019-05-21 DIAGNOSIS — M545 Low back pain: Secondary | ICD-10-CM | POA: Diagnosis not present

## 2019-05-21 DIAGNOSIS — M419 Scoliosis, unspecified: Secondary | ICD-10-CM | POA: Diagnosis not present

## 2019-05-21 DIAGNOSIS — R4582 Worries: Secondary | ICD-10-CM | POA: Diagnosis not present

## 2019-05-21 DIAGNOSIS — Z6825 Body mass index (BMI) 25.0-25.9, adult: Secondary | ICD-10-CM | POA: Diagnosis not present

## 2019-06-02 DIAGNOSIS — F331 Major depressive disorder, recurrent, moderate: Secondary | ICD-10-CM | POA: Diagnosis not present

## 2019-06-02 DIAGNOSIS — I10 Essential (primary) hypertension: Secondary | ICD-10-CM | POA: Diagnosis not present

## 2019-06-02 DIAGNOSIS — E782 Mixed hyperlipidemia: Secondary | ICD-10-CM | POA: Diagnosis not present

## 2019-07-02 DIAGNOSIS — E785 Hyperlipidemia, unspecified: Secondary | ICD-10-CM | POA: Diagnosis not present

## 2019-07-02 DIAGNOSIS — I10 Essential (primary) hypertension: Secondary | ICD-10-CM | POA: Diagnosis not present

## 2019-07-16 DIAGNOSIS — Z23 Encounter for immunization: Secondary | ICD-10-CM | POA: Diagnosis not present

## 2019-08-01 DIAGNOSIS — I10 Essential (primary) hypertension: Secondary | ICD-10-CM | POA: Diagnosis not present

## 2019-08-01 DIAGNOSIS — E7800 Pure hypercholesterolemia, unspecified: Secondary | ICD-10-CM | POA: Diagnosis not present

## 2019-08-06 DIAGNOSIS — Z6825 Body mass index (BMI) 25.0-25.9, adult: Secondary | ICD-10-CM | POA: Diagnosis not present

## 2019-08-06 DIAGNOSIS — R350 Frequency of micturition: Secondary | ICD-10-CM | POA: Diagnosis not present

## 2019-09-05 DIAGNOSIS — I10 Essential (primary) hypertension: Secondary | ICD-10-CM | POA: Diagnosis not present

## 2019-09-05 DIAGNOSIS — R002 Palpitations: Secondary | ICD-10-CM | POA: Diagnosis not present

## 2019-09-05 DIAGNOSIS — E871 Hypo-osmolality and hyponatremia: Secondary | ICD-10-CM | POA: Diagnosis not present

## 2019-09-05 DIAGNOSIS — E782 Mixed hyperlipidemia: Secondary | ICD-10-CM | POA: Diagnosis not present

## 2019-09-05 DIAGNOSIS — E876 Hypokalemia: Secondary | ICD-10-CM | POA: Diagnosis not present

## 2019-09-05 DIAGNOSIS — E7800 Pure hypercholesterolemia, unspecified: Secondary | ICD-10-CM | POA: Diagnosis not present

## 2019-09-09 DIAGNOSIS — M25552 Pain in left hip: Secondary | ICD-10-CM | POA: Diagnosis not present

## 2019-09-09 DIAGNOSIS — I10 Essential (primary) hypertension: Secondary | ICD-10-CM | POA: Diagnosis not present

## 2019-09-09 DIAGNOSIS — E7800 Pure hypercholesterolemia, unspecified: Secondary | ICD-10-CM | POA: Diagnosis not present

## 2019-09-09 DIAGNOSIS — I480 Paroxysmal atrial fibrillation: Secondary | ICD-10-CM | POA: Diagnosis not present

## 2019-09-09 DIAGNOSIS — Z6825 Body mass index (BMI) 25.0-25.9, adult: Secondary | ICD-10-CM | POA: Diagnosis not present

## 2019-09-09 DIAGNOSIS — F411 Generalized anxiety disorder: Secondary | ICD-10-CM | POA: Diagnosis not present

## 2019-09-09 DIAGNOSIS — R002 Palpitations: Secondary | ICD-10-CM | POA: Diagnosis not present

## 2019-09-09 DIAGNOSIS — R350 Frequency of micturition: Secondary | ICD-10-CM | POA: Diagnosis not present

## 2019-09-09 DIAGNOSIS — G252 Other specified forms of tremor: Secondary | ICD-10-CM | POA: Diagnosis not present

## 2019-10-08 ENCOUNTER — Telehealth: Payer: Self-pay | Admitting: Cardiovascular Disease

## 2019-10-08 NOTE — Telephone Encounter (Signed)

## 2019-10-10 ENCOUNTER — Telehealth (INDEPENDENT_AMBULATORY_CARE_PROVIDER_SITE_OTHER): Payer: PPO | Admitting: Cardiovascular Disease

## 2019-10-10 ENCOUNTER — Encounter: Payer: Self-pay | Admitting: Cardiovascular Disease

## 2019-10-10 VITALS — BP 134/60 | HR 60 | Ht 66.0 in | Wt 152.0 lb

## 2019-10-10 DIAGNOSIS — F411 Generalized anxiety disorder: Secondary | ICD-10-CM

## 2019-10-10 DIAGNOSIS — R002 Palpitations: Secondary | ICD-10-CM

## 2019-10-10 DIAGNOSIS — M25475 Effusion, left foot: Secondary | ICD-10-CM

## 2019-10-10 DIAGNOSIS — I10 Essential (primary) hypertension: Secondary | ICD-10-CM

## 2019-10-10 NOTE — Patient Instructions (Signed)
Your physician wants you to follow-up in: 1 YEAR WITH DR KONESWARAN You will receive a reminder letter in the mail two months in advance. If you don't receive a letter, please call our office to schedule the follow-up appointment.  Your physician recommends that you continue on your current medications as directed. Please refer to the Current Medication list given to you today.  Thank you for choosing [facility]!!    

## 2019-10-10 NOTE — Progress Notes (Signed)
Virtual Visit via Telephone Note   This visit type was conducted due to national recommendations for restrictions regarding the COVID-19 Pandemic (e.g. social distancing) in an effort to limit this patient's exposure and mitigate transmission in our community.  Due to her co-morbid illnesses, this patient is at least at moderate risk for complications without adequate follow up.  This format is felt to be most appropriate for this patient at this time.  The patient did not have access to video technology/had technical difficulties with video requiring transitioning to audio format only (telephone).  All issues noted in this document were discussed and addressed.  No physical exam could be performed with this format.  Please refer to the patient's chart for her  consent to telehealth for [facility].   Date:  10/10/2019   ID:  Tanya Mueller, DOB 08/30/1935, MRN [id number]  Patient Location: Home Provider Location: Home  PCP:  Manon Hilding, MD  Cardiologist:  Kate Sable, MD  Electrophysiologist:  None   Evaluation Performed:  Follow-Up Visit  Chief Complaint:  Palps, HTN  History of Present Illness:    Tanya Mueller is a 85 y.o. female with palpitations and hypertension.  Holter monitoringin August 2018demonstrated sinus rhythm with frequent PACs and isolated PVCs with brief atrial runs. Symptoms correlated with PACs.  She was also found to be hypomagnesemic and I started her on magnesium oxide.  She seldom has palpitations. She seldom has chest pains but she does not think it's related to her heart.    Past Medical History:  Diagnosis Date  . Allergic rhinitis   . Arthritis    Back ,hips  . Complication of anesthesia    awareness during general anesthesia.  [name] Dysrhythmia    A-Fib and PSVT  . Frequency of urination   . History of atrial fibrillation    episode 2013 converted with cardizem-- happened at time acute illness due to kidney stone --  resolved  no  issues since  . History of kidney stones   . History of sepsis    urosepsis  2013 due to kidney stone  . Hypertension   . Intermittent palpitations    controlled w/ atenolol  . Left nephrolithiasis   . Left ureteral calculus   . PAC (premature atrial contraction)   . Palpitations   . Solitary lung nodule    benign right middle lobe per CT  . Urgency of urination   . Wears dentures    upper  . Wears partial dentures    lower   Past Surgical History:  Procedure Laterality Date  . CATARACT EXTRACTION W/ INTRAOCULAR LENS  IMPLANT, BILATERAL  2010  . CYSTOSCOPY WITH BIOPSY N/A 04/08/2019   Procedure: CYSTOSCOPY WITH bladder BIOPSY, FULGURATION;  Surgeon: Festus Aloe, MD;  Location: [facility];  Service: Urology;  Laterality: N/A;  . CYSTOSCOPY WITH STENT PLACEMENT Left 09/15/2014   Procedure: CYSTOSCOPY WITH STENT PLACEMENT;  Surgeon: Georgette Dover, MD;  Location: [facility];  Service: Urology;  Laterality: Left;  . CYSTOSCOPY/RETROGRADE/URETEROSCOPY Left 08/09/2014   Procedure: CYSTOSCOPY/RETROGRADE/LEFT STENT;  Surgeon: Bernestine Amass, MD;  Location: [facility];  Service: Urology;  Laterality: Left;  . EXTRACORPOREAL SHOCK WAVE LITHOTRIPSY  2013  . HOLMIUM LASER APPLICATION Left [id number]   Procedure: HOLMIUM LASER APPLICATION;  Surgeon: Georgette Dover, MD;  Location: [facility];  Service: Urology;  Laterality: Left;  . LAPAROTOMY W/  UNILATERAL SALPINGOOPHORECTOMY  age 29  . TOTAL ABDOMINAL  HYSTERECTOMY  age 39   w/ unilateral salpingoophorectomy  . TRANSTHORACIC ECHOCARDIOGRAM  02-13-2012   mild LVH/  ef 65%/  trivial MR  &  PR/  mild RVE  . URETEROSCOPY Left 09/15/2014   Procedure: URETEROSCOPY;  Surgeon: Georgette Dover, MD;  Location: [facility];  Service: Urology;  Laterality: Left;     Current Meds  Medication Sig  . amLODipine (NORVASC) 5 MG tablet TAKE 1 TABLET ONCE DAILY. (Patient taking differently: Take 5 mg by  mouth daily. )  . fluticasone (FLONASE) 50 MCG/ACT nasal spray Place 2 sprays into the nose daily as needed for allergies.   [name] losartan (COZAAR) 100 MG tablet TAKE 1 TABLET ONCE DAILY. (Patient taking differently: Take 100 mg by mouth daily. )  . metoprolol tartrate (LOPRESSOR) 25 MG tablet TAKE (1) TABLET TWICE DAILY.  [name] oxyCODONE-acetaminophen (PERCOCET) 7.5-325 MG tablet Take 1 tablet by mouth every 6 (six) hours as needed for severe pain.   [name] PARoxetine (PAXIL) 20 MG tablet Take 1 tablet (20 mg total) by mouth daily.     Allergies:   Patient has no known allergies.   Social History   Tobacco Use  . Smoking status: Never Smoker  . Smokeless tobacco: Never Used  Substance Use Topics  . Alcohol use: No  . Drug use: No     Family Hx: The patient's family history includes Cancer in some other family members.  ROS:   Please see the history of present illness.     All other systems reviewed and are negative.   Prior CV studies:   The following studies were reviewed today:  Reviewed above  Labs/Other Tests and Data Reviewed:    EKG:  No ECG reviewed.  Recent Labs: 04/01/2019: BUN 13; Creatinine, Ser 0.77; Hemoglobin 13.2; Platelets 258; Potassium 4.2; Sodium 142   Recent Lipid Panel No results found for: CHOL, TRIG, HDL, CHOLHDL, LDLCALC, LDLDIRECT  Wt Readings from Last 3 Encounters:  10/10/19 152 lb (68.9 kg)  04/08/19 153 lb (69.4 kg)  04/01/19 153 lb (69.4 kg)     Objective:    Vital Signs:  BP 134/60   Pulse 60   Ht 5\' 6"  (1.676 m)   Wt 152 lb (68.9 kg)   BMI 24.53 kg/m    VITAL SIGNS:  reviewed  ASSESSMENT & PLAN:    1. Palpitations: Symptomatically stable. Symptoms improved with Paxil. Holter monitoring reviewed above with frequent PACs and isolated PVCs with brief atrial runs. Symptoms appeared to correlate with PACs. Continue metoprolol 25 mg twice daily and 400 mg of magnesium oxide daily.  2.Hypertension:Blood pressure is normal. No  changes to therapy.  3. Generalized anxiety disorder:Symptomatically stable on Paxil 20 mg daily. No changes.  4.  Bilateral ankle and feet swelling: I previously prescribed knee-high compression stockings. She does have swelling but hasn't been wearing them.    COVID-19 Education: The signs and symptoms of COVID-19 were discussed with the patient and how to seek care for testing (follow up with PCP or arrange E-visit).  The importance of social distancing was discussed today.  Time:   Today, I have spent 10 minutes with the patient with telehealth technology discussing the above problems.     Medication Adjustments/Labs and Tests Ordered: Current medicines are reviewed at length with the patient today.  Concerns regarding medicines are outlined above.   Tests Ordered: No orders of the defined types were placed in this encounter.   Medication Changes: No orders of  the defined types were placed in this encounter.   Follow Up:  Virtual Visit  in 1 year(s)  Signed, Kate Sable, MD  10/10/2019 2:28 PM    [place]

## 2019-10-31 DIAGNOSIS — E7801 Familial hypercholesterolemia: Secondary | ICD-10-CM | POA: Diagnosis not present

## 2019-10-31 DIAGNOSIS — I10 Essential (primary) hypertension: Secondary | ICD-10-CM | POA: Diagnosis not present

## 2019-11-08 DIAGNOSIS — R300 Dysuria: Secondary | ICD-10-CM | POA: Diagnosis not present

## 2019-11-08 DIAGNOSIS — Z6825 Body mass index (BMI) 25.0-25.9, adult: Secondary | ICD-10-CM | POA: Diagnosis not present

## 2019-11-08 DIAGNOSIS — R109 Unspecified abdominal pain: Secondary | ICD-10-CM | POA: Diagnosis not present

## 2019-11-28 DIAGNOSIS — I10 Essential (primary) hypertension: Secondary | ICD-10-CM | POA: Diagnosis not present

## 2019-11-28 DIAGNOSIS — E7801 Familial hypercholesterolemia: Secondary | ICD-10-CM | POA: Diagnosis not present

## 2019-11-28 DIAGNOSIS — I480 Paroxysmal atrial fibrillation: Secondary | ICD-10-CM | POA: Diagnosis not present

## 2019-12-26 DIAGNOSIS — Z6825 Body mass index (BMI) 25.0-25.9, adult: Secondary | ICD-10-CM | POA: Diagnosis not present

## 2019-12-26 DIAGNOSIS — M5430 Sciatica, unspecified side: Secondary | ICD-10-CM | POA: Diagnosis not present

## 2020-01-28 DIAGNOSIS — M5432 Sciatica, left side: Secondary | ICD-10-CM | POA: Diagnosis not present

## 2020-02-06 DIAGNOSIS — M419 Scoliosis, unspecified: Secondary | ICD-10-CM | POA: Diagnosis not present

## 2020-02-06 DIAGNOSIS — M5440 Lumbago with sciatica, unspecified side: Secondary | ICD-10-CM | POA: Diagnosis not present

## 2020-02-09 DIAGNOSIS — M5440 Lumbago with sciatica, unspecified side: Secondary | ICD-10-CM | POA: Diagnosis not present

## 2020-02-09 DIAGNOSIS — M419 Scoliosis, unspecified: Secondary | ICD-10-CM | POA: Diagnosis not present

## 2020-02-12 DIAGNOSIS — M419 Scoliosis, unspecified: Secondary | ICD-10-CM | POA: Diagnosis not present

## 2020-02-12 DIAGNOSIS — M5440 Lumbago with sciatica, unspecified side: Secondary | ICD-10-CM | POA: Diagnosis not present

## 2020-02-16 DIAGNOSIS — M5440 Lumbago with sciatica, unspecified side: Secondary | ICD-10-CM | POA: Diagnosis not present

## 2020-02-16 DIAGNOSIS — M419 Scoliosis, unspecified: Secondary | ICD-10-CM | POA: Diagnosis not present

## 2020-02-19 DIAGNOSIS — M5440 Lumbago with sciatica, unspecified side: Secondary | ICD-10-CM | POA: Diagnosis not present

## 2020-02-19 DIAGNOSIS — M419 Scoliosis, unspecified: Secondary | ICD-10-CM | POA: Diagnosis not present

## 2020-02-23 DIAGNOSIS — M5440 Lumbago with sciatica, unspecified side: Secondary | ICD-10-CM | POA: Diagnosis not present

## 2020-02-23 DIAGNOSIS — M419 Scoliosis, unspecified: Secondary | ICD-10-CM | POA: Diagnosis not present

## 2020-02-26 DIAGNOSIS — M5440 Lumbago with sciatica, unspecified side: Secondary | ICD-10-CM | POA: Diagnosis not present

## 2020-02-26 DIAGNOSIS — M419 Scoliosis, unspecified: Secondary | ICD-10-CM | POA: Diagnosis not present

## 2020-03-01 DIAGNOSIS — M47816 Spondylosis without myelopathy or radiculopathy, lumbar region: Secondary | ICD-10-CM | POA: Diagnosis not present

## 2020-03-01 DIAGNOSIS — I129 Hypertensive chronic kidney disease with stage 1 through stage 4 chronic kidney disease, or unspecified chronic kidney disease: Secondary | ICD-10-CM | POA: Diagnosis not present

## 2020-03-01 DIAGNOSIS — N183 Chronic kidney disease, stage 3 unspecified: Secondary | ICD-10-CM | POA: Diagnosis not present

## 2020-03-09 DIAGNOSIS — E871 Hypo-osmolality and hyponatremia: Secondary | ICD-10-CM | POA: Diagnosis not present

## 2020-03-09 DIAGNOSIS — I482 Chronic atrial fibrillation, unspecified: Secondary | ICD-10-CM | POA: Diagnosis not present

## 2020-03-09 DIAGNOSIS — I10 Essential (primary) hypertension: Secondary | ICD-10-CM | POA: Diagnosis not present

## 2020-03-09 DIAGNOSIS — E7800 Pure hypercholesterolemia, unspecified: Secondary | ICD-10-CM | POA: Diagnosis not present

## 2020-03-09 DIAGNOSIS — E782 Mixed hyperlipidemia: Secondary | ICD-10-CM | POA: Diagnosis not present

## 2020-03-09 DIAGNOSIS — N183 Chronic kidney disease, stage 3 unspecified: Secondary | ICD-10-CM | POA: Diagnosis not present

## 2020-03-16 DIAGNOSIS — I480 Paroxysmal atrial fibrillation: Secondary | ICD-10-CM | POA: Diagnosis not present

## 2020-03-16 DIAGNOSIS — Z0001 Encounter for general adult medical examination with abnormal findings: Secondary | ICD-10-CM | POA: Diagnosis not present

## 2020-03-16 DIAGNOSIS — I10 Essential (primary) hypertension: Secondary | ICD-10-CM | POA: Diagnosis not present

## 2020-03-16 DIAGNOSIS — R350 Frequency of micturition: Secondary | ICD-10-CM | POA: Diagnosis not present

## 2020-03-16 DIAGNOSIS — R4582 Worries: Secondary | ICD-10-CM | POA: Diagnosis not present

## 2020-03-16 DIAGNOSIS — R002 Palpitations: Secondary | ICD-10-CM | POA: Diagnosis not present

## 2020-03-16 DIAGNOSIS — M25552 Pain in left hip: Secondary | ICD-10-CM | POA: Diagnosis not present

## 2020-03-16 DIAGNOSIS — Z6825 Body mass index (BMI) 25.0-25.9, adult: Secondary | ICD-10-CM | POA: Diagnosis not present

## 2020-06-01 DIAGNOSIS — N183 Chronic kidney disease, stage 3 unspecified: Secondary | ICD-10-CM | POA: Diagnosis not present

## 2020-06-01 DIAGNOSIS — I129 Hypertensive chronic kidney disease with stage 1 through stage 4 chronic kidney disease, or unspecified chronic kidney disease: Secondary | ICD-10-CM | POA: Diagnosis not present

## 2020-06-01 DIAGNOSIS — M47816 Spondylosis without myelopathy or radiculopathy, lumbar region: Secondary | ICD-10-CM | POA: Diagnosis not present

## 2020-06-16 DIAGNOSIS — F411 Generalized anxiety disorder: Secondary | ICD-10-CM | POA: Diagnosis not present

## 2020-06-16 DIAGNOSIS — E7800 Pure hypercholesterolemia, unspecified: Secondary | ICD-10-CM | POA: Diagnosis not present

## 2020-06-16 DIAGNOSIS — I10 Essential (primary) hypertension: Secondary | ICD-10-CM | POA: Diagnosis not present

## 2020-06-16 DIAGNOSIS — M1990 Unspecified osteoarthritis, unspecified site: Secondary | ICD-10-CM | POA: Diagnosis not present

## 2020-06-16 DIAGNOSIS — Z6825 Body mass index (BMI) 25.0-25.9, adult: Secondary | ICD-10-CM | POA: Diagnosis not present

## 2020-06-16 DIAGNOSIS — Z20828 Contact with and (suspected) exposure to other viral communicable diseases: Secondary | ICD-10-CM | POA: Diagnosis not present

## 2020-06-16 DIAGNOSIS — E782 Mixed hyperlipidemia: Secondary | ICD-10-CM | POA: Diagnosis not present

## 2020-06-16 DIAGNOSIS — I480 Paroxysmal atrial fibrillation: Secondary | ICD-10-CM | POA: Diagnosis not present

## 2020-06-16 DIAGNOSIS — R002 Palpitations: Secondary | ICD-10-CM | POA: Diagnosis not present

## 2020-06-16 DIAGNOSIS — R4582 Worries: Secondary | ICD-10-CM | POA: Diagnosis not present

## 2020-06-16 DIAGNOSIS — M25552 Pain in left hip: Secondary | ICD-10-CM | POA: Diagnosis not present

## 2020-06-16 DIAGNOSIS — J309 Allergic rhinitis, unspecified: Secondary | ICD-10-CM | POA: Diagnosis not present

## 2020-06-16 DIAGNOSIS — R350 Frequency of micturition: Secondary | ICD-10-CM | POA: Diagnosis not present

## 2020-07-01 DIAGNOSIS — N183 Chronic kidney disease, stage 3 unspecified: Secondary | ICD-10-CM | POA: Diagnosis not present

## 2020-07-01 DIAGNOSIS — I129 Hypertensive chronic kidney disease with stage 1 through stage 4 chronic kidney disease, or unspecified chronic kidney disease: Secondary | ICD-10-CM | POA: Diagnosis not present

## 2020-07-01 DIAGNOSIS — M47816 Spondylosis without myelopathy or radiculopathy, lumbar region: Secondary | ICD-10-CM | POA: Diagnosis not present

## 2020-07-26 DIAGNOSIS — Z23 Encounter for immunization: Secondary | ICD-10-CM | POA: Diagnosis not present

## 2020-08-31 DIAGNOSIS — E782 Mixed hyperlipidemia: Secondary | ICD-10-CM | POA: Diagnosis not present

## 2020-08-31 DIAGNOSIS — I129 Hypertensive chronic kidney disease with stage 1 through stage 4 chronic kidney disease, or unspecified chronic kidney disease: Secondary | ICD-10-CM | POA: Diagnosis not present

## 2020-08-31 DIAGNOSIS — M47816 Spondylosis without myelopathy or radiculopathy, lumbar region: Secondary | ICD-10-CM | POA: Diagnosis not present

## 2020-08-31 DIAGNOSIS — N183 Chronic kidney disease, stage 3 unspecified: Secondary | ICD-10-CM | POA: Diagnosis not present

## 2020-09-03 DIAGNOSIS — R350 Frequency of micturition: Secondary | ICD-10-CM | POA: Diagnosis not present

## 2020-09-03 DIAGNOSIS — E782 Mixed hyperlipidemia: Secondary | ICD-10-CM | POA: Diagnosis not present

## 2020-09-03 DIAGNOSIS — I480 Paroxysmal atrial fibrillation: Secondary | ICD-10-CM | POA: Diagnosis not present

## 2020-09-03 DIAGNOSIS — M1990 Unspecified osteoarthritis, unspecified site: Secondary | ICD-10-CM | POA: Diagnosis not present

## 2020-09-03 DIAGNOSIS — R4582 Worries: Secondary | ICD-10-CM | POA: Diagnosis not present

## 2020-09-03 DIAGNOSIS — R002 Palpitations: Secondary | ICD-10-CM | POA: Diagnosis not present

## 2020-09-03 DIAGNOSIS — I10 Essential (primary) hypertension: Secondary | ICD-10-CM | POA: Diagnosis not present

## 2020-10-26 DIAGNOSIS — J069 Acute upper respiratory infection, unspecified: Secondary | ICD-10-CM | POA: Diagnosis not present

## 2020-10-26 DIAGNOSIS — R300 Dysuria: Secondary | ICD-10-CM | POA: Diagnosis not present

## 2020-11-29 DIAGNOSIS — M419 Scoliosis, unspecified: Secondary | ICD-10-CM | POA: Diagnosis not present

## 2020-11-29 DIAGNOSIS — R4582 Worries: Secondary | ICD-10-CM | POA: Diagnosis not present

## 2020-11-29 DIAGNOSIS — S39012A Strain of muscle, fascia and tendon of lower back, initial encounter: Secondary | ICD-10-CM | POA: Diagnosis not present

## 2020-11-29 DIAGNOSIS — Z6826 Body mass index (BMI) 26.0-26.9, adult: Secondary | ICD-10-CM | POA: Diagnosis not present

## 2020-11-29 DIAGNOSIS — M5432 Sciatica, left side: Secondary | ICD-10-CM | POA: Diagnosis not present

## 2020-11-29 DIAGNOSIS — M48062 Spinal stenosis, lumbar region with neurogenic claudication: Secondary | ICD-10-CM | POA: Diagnosis not present

## 2020-11-29 DIAGNOSIS — M47816 Spondylosis without myelopathy or radiculopathy, lumbar region: Secondary | ICD-10-CM | POA: Diagnosis not present

## 2020-11-29 DIAGNOSIS — M545 Low back pain, unspecified: Secondary | ICD-10-CM | POA: Diagnosis not present

## 2020-11-29 DIAGNOSIS — M1990 Unspecified osteoarthritis, unspecified site: Secondary | ICD-10-CM | POA: Diagnosis not present

## 2020-12-15 ENCOUNTER — Encounter: Payer: Self-pay | Admitting: Cardiology

## 2020-12-15 DIAGNOSIS — M25473 Effusion, unspecified ankle: Secondary | ICD-10-CM | POA: Insufficient documentation

## 2020-12-15 DIAGNOSIS — I10 Essential (primary) hypertension: Secondary | ICD-10-CM | POA: Insufficient documentation

## 2020-12-15 NOTE — Progress Notes (Unsigned)
Cardiology Office Note   Date:  12/16/2020   ID:  Tanya Mueller, DOB 06/04/1935, MRN [id number]  PCP:  Manon Hilding, MD  Cardiologist:   Minus Breeding, MD   Chief Complaint  Patient presents with  . Palpitations      History of Present Illness: Tanya Mueller is a 86 y.o. female who was previously seen by Dr. Bronson Ing.  She has had documented PACs and PVCs with some atrial tachycardia.  There is no mention of atrial fibrillation in the distant past but I actually do not see documentation of this.  She was actually in the hospital in 2018 and I reviewed these records and did not see atrial fibrillation.  She has not been on long-term anticoagulation.  She has been treated with some low-dose beta-blocker and she was hypomagnesemic in the past and so was started on magnesium.  She thinks those things helped her palpitations.  However, she still gets nights where she feels her heart racing and skipping.  She cannot go to sleep when that happens.  She has not had any presyncope or syncope.  She denies any chest pressure, neck or arm discomfort.  She lives at home and takes care of her house with her husband.  She is limited by some back pain.  Past Medical History:  Diagnosis Date  . Allergic rhinitis   . Arthritis    Back ,hips  . Complication of anesthesia    awareness during general anesthesia.  [name] Dysrhythmia    A-Fib and PSVT  . History of atrial fibrillation    episode 2013 converted with cardizem-- happened at time acute illness due to kidney stone --  resolved  no issues since  . History of sepsis    urosepsis  2013 due to kidney stone  . Hypertension   . Left nephrolithiasis   . Left ureteral calculus   . PAC (premature atrial contraction)   . Solitary lung nodule    benign right middle lobe per CT    Past Surgical History:  Procedure Laterality Date  . CATARACT EXTRACTION W/ INTRAOCULAR LENS  IMPLANT, BILATERAL  2010  . CYSTOSCOPY WITH BIOPSY N/A 04/08/2019    Procedure: CYSTOSCOPY WITH bladder BIOPSY, FULGURATION;  Surgeon: Festus Aloe, MD;  Location: [facility];  Service: Urology;  Laterality: N/A;  . CYSTOSCOPY WITH STENT PLACEMENT Left 09/15/2014   Procedure: CYSTOSCOPY WITH STENT PLACEMENT;  Surgeon: Georgette Dover, MD;  Location: [facility];  Service: Urology;  Laterality: Left;  . CYSTOSCOPY/RETROGRADE/URETEROSCOPY Left 08/09/2014   Procedure: CYSTOSCOPY/RETROGRADE/LEFT STENT;  Surgeon: Bernestine Amass, MD;  Location: [facility];  Service: Urology;  Laterality: Left;  . EXTRACORPOREAL SHOCK WAVE LITHOTRIPSY  2013  . HOLMIUM LASER APPLICATION Left [id number]/4081   Procedure: HOLMIUM LASER APPLICATION;  Surgeon: Georgette Dover, MD;  Location: [facility];  Service: Urology;  Laterality: Left;  . LAPAROTOMY W/  UNILATERAL SALPINGOOPHORECTOMY  age 42  . TOTAL ABDOMINAL HYSTERECTOMY  age 76   w/ unilateral salpingoophorectomy  . TRANSTHORACIC ECHOCARDIOGRAM  02-13-2012   mild LVH/  ef 65%/  trivial MR  &  PR/  mild RVE  . URETEROSCOPY Left 09/15/2014   Procedure: URETEROSCOPY;  Surgeon: Georgette Dover, MD;  Location: [facility];  Service: Urology;  Laterality: Left;     Current Outpatient Medications  Medication Sig Dispense Refill  . amLODipine (NORVASC) 5 MG tablet TAKE 1 TABLET ONCE DAILY. (Patient taking differently: Take 5  mg by mouth daily.) 90 tablet 3  . cephALEXin (KEFLEX) 250 MG capsule Take 250 mg by mouth daily.    . fluticasone (FLONASE) 50 MCG/ACT nasal spray Place 2 sprays into the nose daily as needed for allergies.     [name] losartan (COZAAR) 100 MG tablet TAKE 1 TABLET ONCE DAILY. (Patient taking differently: Take 100 mg by mouth daily.) 90 tablet 2  . methenamine (MANDELAMINE) 1 g tablet Take 500 mg by mouth 2 (two) times daily.    . metoprolol tartrate (LOPRESSOR) 25 MG tablet TAKE (1) TABLET TWICE DAILY. 180 tablet 1  . oxyCODONE-acetaminophen (PERCOCET) 7.5-325 MG tablet Take  1 tablet by mouth every 6 (six) hours as needed for severe pain.     [name] PARoxetine (PAXIL) 20 MG tablet Take 1 tablet (20 mg total) by mouth daily. 30 tablet 1   No current facility-administered medications for this visit.    Allergies:   Patient has no known allergies.    Social History:  The patient  reports that she has never smoked. She has never used smokeless tobacco. She reports that she does not drink alcohol and does not use drugs.   Family History:  The patient's family history includes Cancer in some other family members.    ROS:  Please see the history of present illness.   Otherwise, review of systems are positive for none.   All other systems are reviewed and negative.    PHYSICAL EXAM: VS:  BP 116/68 (BP Location: Left Arm, Patient Position: Sitting, Cuff Size: Normal)   Pulse (!) 55   SpO2 96%  , BMI There is no height or weight on file to calculate BMI. GENERAL:  Well appearing HEENT:  Pupils equal round and reactive, fundi not visualized, oral mucosa unremarkable NECK:  No jugular venous distention, waveform within normal limits, carotid upstroke brisk and symmetric, no bruits, no thyromegaly LYMPHATICS:  No cervical, inguinal adenopathy LUNGS:  Clear to auscultation bilaterally BACK:  No CVA tenderness CHEST:  Unremarkable HEART:  PMI not displaced or sustained,S1 and S2 within normal limits, no S3, no S4, no clicks, no rubs, no murmurs ABD:  Flat, positive bowel sounds normal in frequency in pitch, no bruits, no rebound, no guarding, no midline pulsatile mass, no hepatomegaly, no splenomegaly EXT:  2 plus pulses throughout, right greater than left leg edema, no cyanosis no clubbing SKIN:  No rashes no nodules NEURO:  Cranial nerves II through XII grossly intact, motor grossly intact throughout PSYCH:  Cognitively intact, oriented to person place and time    EKG:  EKG is ordered today. The ekg ordered today demonstrates sinus rhythm, rate 56, axis within normal  limits, intervals within normal limits, no acute ST-T wave changes.   Recent Labs: No results found for requested labs within last 8760 hours.    Lipid Panel No results found for: CHOL, TRIG, HDL, CHOLHDL, VLDL, LDLCALC, LDLDIRECT    Wt Readings from Last 3 Encounters:  10/10/19 152 lb (68.9 kg)  04/08/19 153 lb (69.4 kg)  04/01/19 153 lb (69.4 kg)      Other studies Reviewed: Additional studies/ records that were reviewed today include: Labs. Review of the above records demonstrates:  Please see elsewhere in the note.     ASSESSMENT AND PLAN:  Palpitations:   She seems to be controlled symptomatically for the most part.  We talked about taking as needed metoprolol one half dose on evenings when she is particularly symptomatic.  If she has to  do this a lot we could increase her maintenance dose.  No further testing is indicated.  Of particular note I do not see evidence of atrial fibrillation that would necessitate systemic anticoagulation.   Accelerated hypertension:The blood pressure is very well controlled.  No change in therapy.   Hypomagnesemia:She continues meds as listed.  No change in therapy.    Current medicines are reviewed at length with the patient today.  The patient does not have concerns regarding medicines.  The following changes have been made:  no change  Labs/ tests ordered today include: None No orders of the defined types were placed in this encounter.    Disposition:   FU with MD in [place] in 12 months.     Signed, Minus Breeding, MD  12/16/2020 12:49 PM    [place]

## 2020-12-16 ENCOUNTER — Ambulatory Visit: Payer: PPO | Admitting: Cardiology

## 2020-12-16 ENCOUNTER — Other Ambulatory Visit: Payer: Self-pay

## 2020-12-16 ENCOUNTER — Encounter: Payer: Self-pay | Admitting: Cardiology

## 2020-12-16 VITALS — BP 116/68 | HR 55

## 2020-12-16 DIAGNOSIS — M25473 Effusion, unspecified ankle: Secondary | ICD-10-CM

## 2020-12-16 DIAGNOSIS — I10 Essential (primary) hypertension: Secondary | ICD-10-CM

## 2020-12-16 DIAGNOSIS — R002 Palpitations: Secondary | ICD-10-CM | POA: Diagnosis not present

## 2020-12-16 NOTE — Patient Instructions (Signed)
Your physician recommends that you schedule a follow-up appointment in: [place] physician recommends that you continue on your current medications as directed. Please refer to the Current Medication list given to you today.  Thank you for choosing [facility]!!

## 2020-12-16 NOTE — Addendum Note (Signed)
Addended by: Merlene Laughter on: 12/16/2020 04:16 PM   Modules accepted: Orders

## 2021-02-03 DIAGNOSIS — L821 Other seborrheic keratosis: Secondary | ICD-10-CM | POA: Diagnosis not present

## 2021-02-03 DIAGNOSIS — L814 Other melanin hyperpigmentation: Secondary | ICD-10-CM | POA: Diagnosis not present

## 2021-02-03 DIAGNOSIS — L570 Actinic keratosis: Secondary | ICD-10-CM | POA: Diagnosis not present

## 2021-02-17 DIAGNOSIS — E7849 Other hyperlipidemia: Secondary | ICD-10-CM | POA: Diagnosis not present

## 2021-02-17 DIAGNOSIS — D649 Anemia, unspecified: Secondary | ICD-10-CM | POA: Diagnosis not present

## 2021-02-17 DIAGNOSIS — E7800 Pure hypercholesterolemia, unspecified: Secondary | ICD-10-CM | POA: Diagnosis not present

## 2021-02-17 DIAGNOSIS — N183 Chronic kidney disease, stage 3 unspecified: Secondary | ICD-10-CM | POA: Diagnosis not present

## 2021-02-17 DIAGNOSIS — E876 Hypokalemia: Secondary | ICD-10-CM | POA: Diagnosis not present

## 2021-02-17 DIAGNOSIS — E7801 Familial hypercholesterolemia: Secondary | ICD-10-CM | POA: Diagnosis not present

## 2021-02-17 DIAGNOSIS — E782 Mixed hyperlipidemia: Secondary | ICD-10-CM | POA: Diagnosis not present

## 2021-02-23 DIAGNOSIS — M1990 Unspecified osteoarthritis, unspecified site: Secondary | ICD-10-CM | POA: Diagnosis not present

## 2021-02-23 DIAGNOSIS — M5432 Sciatica, left side: Secondary | ICD-10-CM | POA: Diagnosis not present

## 2021-02-23 DIAGNOSIS — M48062 Spinal stenosis, lumbar region with neurogenic claudication: Secondary | ICD-10-CM | POA: Diagnosis not present

## 2021-02-23 DIAGNOSIS — N3090 Cystitis, unspecified without hematuria: Secondary | ICD-10-CM | POA: Diagnosis not present

## 2021-02-23 DIAGNOSIS — R4582 Worries: Secondary | ICD-10-CM | POA: Diagnosis not present

## 2021-02-23 DIAGNOSIS — M545 Low back pain, unspecified: Secondary | ICD-10-CM | POA: Diagnosis not present

## 2021-02-23 DIAGNOSIS — M47816 Spondylosis without myelopathy or radiculopathy, lumbar region: Secondary | ICD-10-CM | POA: Diagnosis not present

## 2021-02-23 DIAGNOSIS — M419 Scoliosis, unspecified: Secondary | ICD-10-CM | POA: Diagnosis not present

## 2021-02-23 DIAGNOSIS — Z6826 Body mass index (BMI) 26.0-26.9, adult: Secondary | ICD-10-CM | POA: Diagnosis not present

## 2021-03-09 DIAGNOSIS — L719 Rosacea, unspecified: Secondary | ICD-10-CM | POA: Diagnosis not present

## 2021-03-09 DIAGNOSIS — L570 Actinic keratosis: Secondary | ICD-10-CM | POA: Diagnosis not present

## 2021-03-14 DIAGNOSIS — S0510XA Contusion of eyeball and orbital tissues, unspecified eye, initial encounter: Secondary | ICD-10-CM | POA: Diagnosis not present

## 2021-03-14 DIAGNOSIS — S0990XA Unspecified injury of head, initial encounter: Secondary | ICD-10-CM | POA: Diagnosis not present

## 2021-03-14 DIAGNOSIS — M25519 Pain in unspecified shoulder: Secondary | ICD-10-CM | POA: Diagnosis not present

## 2021-03-14 DIAGNOSIS — S43492A Other sprain of left shoulder joint, initial encounter: Secondary | ICD-10-CM | POA: Diagnosis not present

## 2021-03-14 DIAGNOSIS — S199XXA Unspecified injury of neck, initial encounter: Secondary | ICD-10-CM | POA: Diagnosis not present

## 2021-03-14 DIAGNOSIS — S4992XA Unspecified injury of left shoulder and upper arm, initial encounter: Secondary | ICD-10-CM | POA: Diagnosis not present

## 2021-03-14 DIAGNOSIS — I10 Essential (primary) hypertension: Secondary | ICD-10-CM | POA: Diagnosis not present

## 2021-03-14 DIAGNOSIS — R0902 Hypoxemia: Secondary | ICD-10-CM | POA: Diagnosis not present

## 2021-03-14 DIAGNOSIS — M47812 Spondylosis without myelopathy or radiculopathy, cervical region: Secondary | ICD-10-CM | POA: Diagnosis not present

## 2021-03-14 DIAGNOSIS — W19XXXA Unspecified fall, initial encounter: Secondary | ICD-10-CM | POA: Diagnosis not present

## 2021-03-14 DIAGNOSIS — S0512XA Contusion of eyeball and orbital tissues, left eye, initial encounter: Secondary | ICD-10-CM | POA: Diagnosis not present

## 2021-03-14 DIAGNOSIS — Z87442 Personal history of urinary calculi: Secondary | ICD-10-CM | POA: Diagnosis not present

## 2021-03-14 DIAGNOSIS — S134XXA Sprain of ligaments of cervical spine, initial encounter: Secondary | ICD-10-CM | POA: Diagnosis not present

## 2021-03-14 DIAGNOSIS — S139XXA Sprain of joints and ligaments of unspecified parts of neck, initial encounter: Secondary | ICD-10-CM | POA: Diagnosis not present

## 2021-03-14 DIAGNOSIS — S0181XA Laceration without foreign body of other part of head, initial encounter: Secondary | ICD-10-CM | POA: Diagnosis not present

## 2021-03-14 DIAGNOSIS — W010XXA Fall on same level from slipping, tripping and stumbling without subsequent striking against object, initial encounter: Secondary | ICD-10-CM | POA: Diagnosis not present

## 2021-03-18 DIAGNOSIS — S0083XA Contusion of other part of head, initial encounter: Secondary | ICD-10-CM | POA: Diagnosis not present

## 2021-03-18 DIAGNOSIS — Z6826 Body mass index (BMI) 26.0-26.9, adult: Secondary | ICD-10-CM | POA: Diagnosis not present

## 2021-03-18 DIAGNOSIS — M25519 Pain in unspecified shoulder: Secondary | ICD-10-CM | POA: Diagnosis not present

## 2021-04-11 DIAGNOSIS — N3090 Cystitis, unspecified without hematuria: Secondary | ICD-10-CM | POA: Diagnosis not present

## 2021-04-11 DIAGNOSIS — R300 Dysuria: Secondary | ICD-10-CM | POA: Diagnosis not present

## 2021-04-19 DIAGNOSIS — Z6826 Body mass index (BMI) 26.0-26.9, adult: Secondary | ICD-10-CM | POA: Diagnosis not present

## 2021-04-19 DIAGNOSIS — R350 Frequency of micturition: Secondary | ICD-10-CM | POA: Diagnosis not present

## 2021-05-13 DIAGNOSIS — N3090 Cystitis, unspecified without hematuria: Secondary | ICD-10-CM | POA: Diagnosis not present

## 2021-05-13 DIAGNOSIS — J069 Acute upper respiratory infection, unspecified: Secondary | ICD-10-CM | POA: Diagnosis not present

## 2021-05-13 DIAGNOSIS — Z20828 Contact with and (suspected) exposure to other viral communicable diseases: Secondary | ICD-10-CM | POA: Diagnosis not present

## 2021-05-20 DIAGNOSIS — E7849 Other hyperlipidemia: Secondary | ICD-10-CM | POA: Diagnosis not present

## 2021-05-20 DIAGNOSIS — I10 Essential (primary) hypertension: Secondary | ICD-10-CM | POA: Diagnosis not present

## 2021-05-20 DIAGNOSIS — E782 Mixed hyperlipidemia: Secondary | ICD-10-CM | POA: Diagnosis not present

## 2021-05-20 DIAGNOSIS — N183 Chronic kidney disease, stage 3 unspecified: Secondary | ICD-10-CM | POA: Diagnosis not present

## 2021-05-20 DIAGNOSIS — E7801 Familial hypercholesterolemia: Secondary | ICD-10-CM | POA: Diagnosis not present

## 2021-05-20 DIAGNOSIS — E7800 Pure hypercholesterolemia, unspecified: Secondary | ICD-10-CM | POA: Diagnosis not present

## 2021-05-25 DIAGNOSIS — M5432 Sciatica, left side: Secondary | ICD-10-CM | POA: Diagnosis not present

## 2021-05-25 DIAGNOSIS — I10 Essential (primary) hypertension: Secondary | ICD-10-CM | POA: Diagnosis not present

## 2021-05-25 DIAGNOSIS — Z0001 Encounter for general adult medical examination with abnormal findings: Secondary | ICD-10-CM | POA: Diagnosis not present

## 2021-05-25 DIAGNOSIS — M47816 Spondylosis without myelopathy or radiculopathy, lumbar region: Secondary | ICD-10-CM | POA: Diagnosis not present

## 2021-05-25 DIAGNOSIS — N3090 Cystitis, unspecified without hematuria: Secondary | ICD-10-CM | POA: Diagnosis not present

## 2021-05-25 DIAGNOSIS — M545 Low back pain, unspecified: Secondary | ICD-10-CM | POA: Diagnosis not present

## 2021-05-25 DIAGNOSIS — M48062 Spinal stenosis, lumbar region with neurogenic claudication: Secondary | ICD-10-CM | POA: Diagnosis not present

## 2021-05-25 DIAGNOSIS — R4582 Worries: Secondary | ICD-10-CM | POA: Diagnosis not present

## 2021-05-25 DIAGNOSIS — Z6826 Body mass index (BMI) 26.0-26.9, adult: Secondary | ICD-10-CM | POA: Diagnosis not present

## 2021-05-25 DIAGNOSIS — M1990 Unspecified osteoarthritis, unspecified site: Secondary | ICD-10-CM | POA: Diagnosis not present

## 2021-05-25 DIAGNOSIS — R002 Palpitations: Secondary | ICD-10-CM | POA: Diagnosis not present

## 2021-05-25 DIAGNOSIS — M419 Scoliosis, unspecified: Secondary | ICD-10-CM | POA: Diagnosis not present

## 2021-06-10 DIAGNOSIS — Z20828 Contact with and (suspected) exposure to other viral communicable diseases: Secondary | ICD-10-CM | POA: Diagnosis not present

## 2021-06-10 DIAGNOSIS — J40 Bronchitis, not specified as acute or chronic: Secondary | ICD-10-CM | POA: Diagnosis not present

## 2021-06-17 DIAGNOSIS — J329 Chronic sinusitis, unspecified: Secondary | ICD-10-CM | POA: Diagnosis not present

## 2021-06-17 DIAGNOSIS — J40 Bronchitis, not specified as acute or chronic: Secondary | ICD-10-CM | POA: Diagnosis not present

## 2021-06-24 DIAGNOSIS — J40 Bronchitis, not specified as acute or chronic: Secondary | ICD-10-CM | POA: Diagnosis not present

## 2021-06-24 DIAGNOSIS — Z6826 Body mass index (BMI) 26.0-26.9, adult: Secondary | ICD-10-CM | POA: Diagnosis not present

## 2021-06-24 DIAGNOSIS — J329 Chronic sinusitis, unspecified: Secondary | ICD-10-CM | POA: Diagnosis not present

## 2021-07-14 DIAGNOSIS — Z23 Encounter for immunization: Secondary | ICD-10-CM | POA: Diagnosis not present

## 2021-07-26 DIAGNOSIS — Z6827 Body mass index (BMI) 27.0-27.9, adult: Secondary | ICD-10-CM | POA: Diagnosis not present

## 2021-07-26 DIAGNOSIS — Z6831 Body mass index (BMI) 31.0-31.9, adult: Secondary | ICD-10-CM | POA: Diagnosis not present

## 2021-07-26 DIAGNOSIS — N390 Urinary tract infection, site not specified: Secondary | ICD-10-CM | POA: Diagnosis not present

## 2021-08-08 DIAGNOSIS — I10 Essential (primary) hypertension: Secondary | ICD-10-CM | POA: Diagnosis not present

## 2021-08-08 DIAGNOSIS — N183 Chronic kidney disease, stage 3 unspecified: Secondary | ICD-10-CM | POA: Diagnosis not present

## 2021-08-08 DIAGNOSIS — E7849 Other hyperlipidemia: Secondary | ICD-10-CM | POA: Diagnosis not present

## 2021-08-08 DIAGNOSIS — E782 Mixed hyperlipidemia: Secondary | ICD-10-CM | POA: Diagnosis not present

## 2021-08-08 DIAGNOSIS — E7801 Familial hypercholesterolemia: Secondary | ICD-10-CM | POA: Diagnosis not present

## 2021-08-08 DIAGNOSIS — D649 Anemia, unspecified: Secondary | ICD-10-CM | POA: Diagnosis not present

## 2021-08-08 DIAGNOSIS — E7800 Pure hypercholesterolemia, unspecified: Secondary | ICD-10-CM | POA: Diagnosis not present

## 2021-08-11 DIAGNOSIS — M1990 Unspecified osteoarthritis, unspecified site: Secondary | ICD-10-CM | POA: Diagnosis not present

## 2021-08-11 DIAGNOSIS — R4582 Worries: Secondary | ICD-10-CM | POA: Diagnosis not present

## 2021-08-11 DIAGNOSIS — R002 Palpitations: Secondary | ICD-10-CM | POA: Diagnosis not present

## 2021-08-11 DIAGNOSIS — I10 Essential (primary) hypertension: Secondary | ICD-10-CM | POA: Diagnosis not present

## 2021-08-11 DIAGNOSIS — M5432 Sciatica, left side: Secondary | ICD-10-CM | POA: Diagnosis not present

## 2021-08-11 DIAGNOSIS — Z23 Encounter for immunization: Secondary | ICD-10-CM | POA: Diagnosis not present

## 2021-08-11 DIAGNOSIS — M48062 Spinal stenosis, lumbar region with neurogenic claudication: Secondary | ICD-10-CM | POA: Diagnosis not present

## 2021-08-11 DIAGNOSIS — M545 Low back pain, unspecified: Secondary | ICD-10-CM | POA: Diagnosis not present

## 2021-08-11 DIAGNOSIS — M419 Scoliosis, unspecified: Secondary | ICD-10-CM | POA: Diagnosis not present

## 2021-09-05 DIAGNOSIS — Z6825 Body mass index (BMI) 25.0-25.9, adult: Secondary | ICD-10-CM | POA: Diagnosis not present

## 2021-09-05 DIAGNOSIS — N3090 Cystitis, unspecified without hematuria: Secondary | ICD-10-CM | POA: Diagnosis not present

## 2021-09-07 DIAGNOSIS — C44629 Squamous cell carcinoma of skin of left upper limb, including shoulder: Secondary | ICD-10-CM | POA: Diagnosis not present

## 2021-10-06 DIAGNOSIS — C44629 Squamous cell carcinoma of skin of left upper limb, including shoulder: Secondary | ICD-10-CM | POA: Diagnosis not present

## 2021-10-19 DIAGNOSIS — M545 Low back pain, unspecified: Secondary | ICD-10-CM | POA: Diagnosis not present

## 2021-10-19 DIAGNOSIS — Z6826 Body mass index (BMI) 26.0-26.9, adult: Secondary | ICD-10-CM | POA: Diagnosis not present

## 2021-10-19 DIAGNOSIS — N3090 Cystitis, unspecified without hematuria: Secondary | ICD-10-CM | POA: Diagnosis not present

## 2021-11-02 DIAGNOSIS — Z6825 Body mass index (BMI) 25.0-25.9, adult: Secondary | ICD-10-CM | POA: Diagnosis not present

## 2021-11-02 DIAGNOSIS — N3090 Cystitis, unspecified without hematuria: Secondary | ICD-10-CM | POA: Diagnosis not present

## 2021-11-02 DIAGNOSIS — K5792 Diverticulitis of intestine, part unspecified, without perforation or abscess without bleeding: Secondary | ICD-10-CM | POA: Diagnosis not present

## 2021-11-08 ENCOUNTER — Emergency Department (HOSPITAL_COMMUNITY): Payer: PPO

## 2021-11-08 ENCOUNTER — Encounter (HOSPITAL_COMMUNITY): Payer: Self-pay | Admitting: *Deleted

## 2021-11-08 ENCOUNTER — Emergency Department (HOSPITAL_COMMUNITY)
Admission: EM | Admit: 2021-11-08 | Discharge: 2021-11-08 | Disposition: A | Discharge status: Home / Self Care | Payer: PPO | Attending: Emergency Medicine | Admitting: Emergency Medicine

## 2021-11-08 ENCOUNTER — Other Ambulatory Visit: Payer: Self-pay

## 2021-11-08 DIAGNOSIS — I10 Essential (primary) hypertension: Secondary | ICD-10-CM | POA: Diagnosis not present

## 2021-11-08 DIAGNOSIS — R1032 Left lower quadrant pain: Secondary | ICD-10-CM | POA: Diagnosis present

## 2021-11-08 DIAGNOSIS — N200 Calculus of kidney: Secondary | ICD-10-CM | POA: Insufficient documentation

## 2021-11-08 DIAGNOSIS — I700 Atherosclerosis of aorta: Secondary | ICD-10-CM | POA: Diagnosis not present

## 2021-11-08 DIAGNOSIS — N3289 Other specified disorders of bladder: Secondary | ICD-10-CM | POA: Diagnosis not present

## 2021-11-08 DIAGNOSIS — Z79899 Other long term (current) drug therapy: Secondary | ICD-10-CM | POA: Insufficient documentation

## 2021-11-08 DIAGNOSIS — R109 Unspecified abdominal pain: Secondary | ICD-10-CM

## 2021-11-08 LAB — URINALYSIS, ROUTINE W REFLEX MICROSCOPIC
Bilirubin Urine: NEGATIVE
Glucose, UA: NEGATIVE mg/dL
Ketones, ur: NEGATIVE mg/dL
Nitrite: NEGATIVE
Protein, ur: NEGATIVE mg/dL
Specific Gravity, Urine: 1.004 — ABNORMAL LOW (ref 1.005–1.030)
pH: 7 (ref 5.0–8.0)

## 2021-11-08 LAB — COMPREHENSIVE METABOLIC PANEL WITH GFR
ALT: 54 U/L — ABNORMAL HIGH (ref 0–44)
AST: 77 U/L — ABNORMAL HIGH (ref 15–41)
Albumin: 3.8 g/dL (ref 3.5–5.0)
Alkaline Phosphatase: 53 U/L (ref 38–126)
Anion gap: 8 (ref 5–15)
BUN: 11 mg/dL (ref 8–23)
CO2: 24 mmol/L (ref 22–32)
Calcium: 9.4 mg/dL (ref 8.9–10.3)
Chloride: 103 mmol/L (ref 98–111)
Creatinine, Ser: 0.8 mg/dL (ref 0.44–1.00)
GFR, Estimated: >60 mL/min
Glucose, Bld: 124 mg/dL — ABNORMAL HIGH (ref 70–99)
Potassium: 4.1 mmol/L (ref 3.5–5.1)
Sodium: 135 mmol/L (ref 135–145)
Total Bilirubin: 0.9 mg/dL (ref 0.3–1.2)
Total Protein: 7.7 g/dL (ref 6.5–8.1)

## 2021-11-08 LAB — CBC
HCT: 41.4 % (ref 36.0–46.0)
Hemoglobin: 13.7 g/dL (ref 12.0–15.0)
MCH: 34.3 pg — ABNORMAL HIGH (ref 26.0–34.0)
MCHC: 33.1 g/dL (ref 30.0–36.0)
MCV: 103.5 fL — ABNORMAL HIGH (ref 80.0–100.0)
Platelets: 270 K/uL (ref 150–400)
RBC: 4 MIL/uL (ref 3.87–5.11)
RDW: 12.4 % (ref 11.5–15.5)
WBC: 7.3 K/uL (ref 4.0–10.5)
nRBC: 0 % (ref 0.0–0.2)

## 2021-11-08 LAB — LIPASE, BLOOD: Lipase: 34 U/L (ref 11–51)

## 2021-11-08 MED ORDER — ONDANSETRON HCL 4 MG/2ML IJ SOLN
4.0000 mg | Freq: Once | INTRAMUSCULAR | Status: AC
  Administered 2021-11-08: 4 mg via INTRAVENOUS
  Filled 2021-11-08: qty 2

## 2021-11-08 MED ORDER — HYDROMORPHONE HCL 1 MG/ML IJ SOLN
0.5000 mg | Freq: Once | INTRAMUSCULAR | Status: AC
  Administered 2021-11-08: 0.5 mg via INTRAVENOUS
  Filled 2021-11-08: qty 1

## 2021-11-08 MED ORDER — IOHEXOL 300 MG/ML  SOLN
100.0000 mL | Freq: Once | INTRAMUSCULAR | Status: AC | PRN
  Administered 2021-11-08: 100 mL via INTRAVENOUS

## 2021-11-08 NOTE — Discharge Instructions (Signed)
Lab work imaging reassuring please continue with the antibiotics that are prescribed to you.  Would like to follow-up with urology for further evaluation given the contact information above  Come back to the emergency department if you develop chest pain, shortness of breath, severe abdominal pain, uncontrolled nausea, vomiting, diarrhea.

## 2021-11-08 NOTE — ED Triage Notes (Signed)
Low abdominal pain for over a week, on treatment for UTI currently

## 2021-11-08 NOTE — ED Provider Notes (Signed)
[facility] Provider Note   CSN: [id number] Arrival date & time: 11/08/21  1333     History  Chief Complaint  Patient presents with   Abdominal Pain    AARALYN KIL is a 87 y.o. female.  HPI  Patient with medical history including A-fib not on anticoagulants, nephrolithiasis hypertension presents with complaints of left side pain.  Patient states this started about 10 days ago, came on suddenly, has intermittent throbbing-like sensation in her left side and will go into her left lower abdomen, not worsen with movement or p.o. intake, describes as a pressure-like sensation, she does get relief with bowel movements, says that she been taking Dulcolax which been helping, her last bowel movement was today, she denies hematochezia or melena, she denies any urinary symptoms, urinary urgency frequency dysuria no associated nausea or vomiting no fevers or chills. states she has history of having UTIs she is currently being treated for UTI at the moment Flagyl as well as doxycycline started this 3 days ago, she also has a history of kidney stones Symptoms were fevers, chills, congestion, cough, general body aches.    Reviewed patient's chart was above by Dr. Junious Silk of urology back in 2020 has a noted 10 mm stone in the inferior pole of the left kidney no other acute abnormalities present.    Home Medications Prior to Admission medications   Medication Sig Start Date End Date Taking? Authorizing Provider  amLODipine (NORVASC) 5 MG tablet TAKE 1 TABLET ONCE DAILY. Patient taking differently: Take 5 mg by mouth daily. 11/04/18  Yes Herminio Commons, MD  doxycycline (VIBRA-TABS) 100 MG tablet Take 100 mg by mouth 2 (two) times daily. 11/05/21  Yes [provider]  fluticasone (FLONASE) 50 MCG/ACT nasal spray Place 2 sprays into the nose daily as needed for allergies.    Yes [provider]  ibuprofen (ADVIL) 400 MG tablet Take by mouth. 03/14/21  Yes  [provider]  losartan (COZAAR) 100 MG tablet TAKE 1 TABLET ONCE DAILY. Patient taking differently: Take 100 mg by mouth daily. 06/18/18  Yes Herminio Commons, MD  metoprolol tartrate (LOPRESSOR) 25 MG tablet TAKE (1) TABLET TWICE DAILY. Patient taking differently: Take 25 mg by mouth 2 (two) times daily. 04/24/19  Yes Herminio Commons, MD  metroNIDAZOLE (FLAGYL) 500 MG tablet Take 500 mg by mouth 2 (two) times daily. 11/02/21  Yes [provider]  oxyCODONE-acetaminophen (PERCOCET) 7.5-325 MG tablet Take 1 tablet by mouth every 6 (six) hours as needed for severe pain.    Yes [provider]  vitamin E 180 MG (400 UNITS) capsule Take 400 Units by mouth daily.   Yes [provider]  PARoxetine (PAXIL) 20 MG tablet Take 1 tablet (20 mg total) by mouth daily. Patient not taking: Reported on 11/08/2021 06/08/17   Herminio Commons, MD      Allergies    Patient has no known allergies.    Review of Systems   Review of Systems  Constitutional:  Negative for chills and fever.  Respiratory:  Negative for shortness of breath.   Cardiovascular:  Negative for chest pain.  Gastrointestinal:  Positive for abdominal pain. Negative for constipation and vomiting.  Genitourinary:  Positive for flank pain. Negative for difficulty urinating and enuresis.  Neurological:  Negative for headaches.   Physical Exam Updated Vital Signs BP (!) 157/82    Pulse 75    Temp 98.3 F (36.8 C)    Resp 16  SpO2 100%  Physical Exam Vitals and nursing note reviewed.  Constitutional:      General: She is not in acute distress.    Appearance: She is not ill-appearing.  HENT:     Head: Normocephalic and atraumatic.     Nose: No congestion.  Eyes:     Conjunctiva/sclera: Conjunctivae normal.  Cardiovascular:     Rate and Rhythm: Normal rate and regular rhythm.     Pulses: Normal pulses.     Heart sounds: No murmur heard.   No friction rub. No gallop.  Pulmonary:      Effort: No respiratory distress.     Breath sounds: No wheezing, rhonchi or rales.  Abdominal:     Palpations: Abdomen is soft.     Tenderness: There is abdominal tenderness. There is no right CVA tenderness or left CVA tenderness.     Comments: Abdomen nondistended normal bowel sounds, dull to percussion, has slight left-sided pain, there is no guarding, rebound tenderness, peritoneal sign, negative Murphy sign McBurney point no CVA tenderness.  Musculoskeletal:     Right lower leg: No edema.     Left lower leg: No edema.  Skin:    General: Skin is warm and dry.  Neurological:     Mental Status: She is alert.  Psychiatric:        Mood and Affect: Mood normal.    ED Results / Procedures / Treatments   Labs (all labs ordered are listed, but only abnormal results are displayed) Labs Reviewed  COMPREHENSIVE METABOLIC PANEL - Abnormal; Notable for the following components:      Result Value   Glucose, Bld 124 (*)    AST 77 (*)    ALT 54 (*)    All other components within normal limits  CBC - Abnormal; Notable for the following components:   MCV 103.5 (*)    MCH 34.3 (*)    All other components within normal limits  URINALYSIS, ROUTINE W REFLEX MICROSCOPIC - Abnormal; Notable for the following components:   Specific Gravity, Urine 1.004 (*)    Hgb urine dipstick SMALL (*)    Leukocytes,Ua SMALL (*)    Bacteria, UA RARE (*)    All other components within normal limits  LIPASE, BLOOD    EKG None  Radiology CT Abdomen Pelvis W Contrast  Result Date: 11/08/2021 CLINICAL DATA:  Worsening left-sided abdominal pain for 1 week EXAM: CT ABDOMEN AND PELVIS WITH CONTRAST TECHNIQUE: Multidetector CT imaging of the abdomen and pelvis was performed using the standard protocol following bolus administration of intravenous contrast. RADIATION DOSE REDUCTION: This exam was performed according to the departmental dose-optimization program which includes automated exposure control, adjustment of  the mA and/or kV according to patient size and/or use of iterative reconstruction technique. CONTRAST:  154mL OMNIPAQUE IOHEXOL 300 MG/ML  SOLN COMPARISON:  03/04/2019 FINDINGS: Lower chest: No acute pleural or parenchymal lung disease. Stable bibasilar scarring. Hepatobiliary: No focal liver abnormality is seen. No gallstones, gallbladder wall thickening, or biliary dilatation. Pancreas: Unremarkable. No pancreatic ductal dilatation or surrounding inflammatory changes. Spleen: Normal in size without focal abnormality. Adrenals/Urinary Tract: There is a large lamellated nonobstructing calculus within the left renal pelvis, measuring 2.3 by 1.5 x 2.0 cm. Multiple other nonobstructing left renal calculi are identified, largest in the lower pole calyx measuring 8 mm. There are multiple nonobstructing right renal calculi, largest measuring 5 mm within the lower pole calyx. No obstructive uropathy within either kidney. Mild mucosal enhancement of the left ureter  could reflect underlying infection. Bladder is moderately distended without gross abnormality. The adrenals are normal. Stomach/Bowel: No bowel obstruction or ileus. No bowel wall thickening or inflammatory change. Vascular/Lymphatic: Aortic atherosclerosis. No enlarged abdominal or pelvic lymph nodes. Reproductive: Status post hysterectomy. No adnexal masses. Other: No free fluid or free gas.  No abdominal wall hernia. Musculoskeletal: No acute or destructive bony lesions. Extensive postsurgical changes throughout the lumbar spine again noted. Reconstructed images demonstrate no additional findings. IMPRESSION: 1. Bilateral nonobstructing renal calculi, left greater than right. 2. Mild enhancement of the left ureteral mucosa, nonspecific. This could reflect underlying infection. Please correlate with urinalysis. 3.  Aortic Atherosclerosis (ICD10-I70.0). Electronically Signed   By: Randa Ngo M.D.   On: 11/08/2021 17:35    Procedures Procedures     Medications Ordered in ED Medications  HYDROmorphone (DILAUDID) injection 0.5 mg (0.5 mg Intravenous Given 11/08/21 1631)  ondansetron (ZOFRAN) injection 4 mg (4 mg Intravenous Given 11/08/21 1631)  iohexol (OMNIPAQUE) 300 MG/ML solution 100 mL (100 mLs Intravenous Contrast Given 11/08/21 1724)    ED Course/ Medical Decision Making/ A&P                           Medical Decision Making Amount and/or Complexity of Data Reviewed Labs: ordered. Radiology: ordered.  Risk Prescription drug management.   This patient presents to the ED for concern of abdominal pain, this involves an extensive number of treatment options, and is a complaint that carries with it a high risk of complications and morbidity.  The differential diagnosis includes diverticulitis, bowel obstruction, kidney stone, Pilo    Additional history obtained:  Additional history obtained from electronic medical record External records from outside source obtained and reviewed including please see HPI   Co morbidities that complicate the patient evaluation  N/A  Social Determinants of Health:  Age    Lab Tests:  I Ordered, and personally interpreted labs.  The pertinent results include: CBC unremarkable, CMP shows glucose of 124, AST 77 ALT 54 UA shows small leukocytes white blood cells rare bacteria lipase 34   Imaging Studies ordered:  I ordered imaging studies including CT abdomen pelvis I independently visualized and interpreted imaging which showed bilateral nonobstructing kidney stones larger than the left versus the right, I agree with the radiologist interpretation   Cardiac Monitoring:  The patient was maintained on a cardiac monitor.  I personally viewed and interpreted the cardiac monitored which showed an underlying rhythm of: N/A   Medicines ordered and prescription drug management:  I ordered medication including Dilaudid for pain I have reviewed the patients home medicines and have  made adjustments as needed    Reevaluation:  On arrival patient endorsing some side pain, provided with Dilaudid, reassessed she states she is feeling better, resting calmly, vital signs remained stable.  Reassessed update on lab or imaging have no complaints, vitals and again have remained stable she is agreeable for discharge.    Rule out Low suspicion for systemic infection, nontoxic-appearing, vital signs reassuring, does not meet sepsis or SIRS criteria.  Low suspicion for lower lobe pneumonia lung sounds are clear bilaterally does not endorse URI-like symptoms.  Low suspicion for UTI, pyelo-, kidney stone UA is negative for hematuria, no nitrates, only small leukocytes and rare bacteria none endorsing any urinary symptoms currently on antibiotics we will have her continue with this.  Low suspicion for bowel obstruction, volvulus, diverticulitis, bowel perforation CT imaging are negative or these findings.  Low suspicion for dissection as presentation atypical of etiology.    Dispostion and problem list  After consideration of the diagnostic results and the patients response to treatment, I feel that the patent would benefit from discharge.  Flank pain-unclear etiology likely multifactorial possibly from chronic back pain as well as large stone seen in the renal pole, will have her follow-up with urology for further evaluation. UTI-urine slightly abnormal, she is nontoxic-appearing no leukocytosis we will have her continue with antibiotics follow-up PCP as needed.            Final Clinical Impression(s) / ED Diagnoses Final diagnoses:  Flank pain    Rx / DC Orders ED Discharge Orders     None         Marcello Fennel, PA-C 11/08/21 1839    Truddie Hidden, MD 11/08/21 2320

## 2021-11-09 DIAGNOSIS — M47816 Spondylosis without myelopathy or radiculopathy, lumbar region: Secondary | ICD-10-CM | POA: Diagnosis not present

## 2021-11-09 DIAGNOSIS — M5432 Sciatica, left side: Secondary | ICD-10-CM | POA: Diagnosis not present

## 2021-11-09 DIAGNOSIS — R002 Palpitations: Secondary | ICD-10-CM | POA: Diagnosis not present

## 2021-11-09 DIAGNOSIS — M1990 Unspecified osteoarthritis, unspecified site: Secondary | ICD-10-CM | POA: Diagnosis not present

## 2021-11-09 DIAGNOSIS — I10 Essential (primary) hypertension: Secondary | ICD-10-CM | POA: Diagnosis not present

## 2021-11-09 DIAGNOSIS — R109 Unspecified abdominal pain: Secondary | ICD-10-CM | POA: Diagnosis not present

## 2021-11-09 DIAGNOSIS — M419 Scoliosis, unspecified: Secondary | ICD-10-CM | POA: Diagnosis not present

## 2021-11-09 DIAGNOSIS — M48062 Spinal stenosis, lumbar region with neurogenic claudication: Secondary | ICD-10-CM | POA: Diagnosis not present

## 2021-11-29 DIAGNOSIS — N3090 Cystitis, unspecified without hematuria: Secondary | ICD-10-CM | POA: Diagnosis not present

## 2021-11-29 DIAGNOSIS — Z6826 Body mass index (BMI) 26.0-26.9, adult: Secondary | ICD-10-CM | POA: Diagnosis not present

## 2021-12-26 DIAGNOSIS — Z6825 Body mass index (BMI) 25.0-25.9, adult: Secondary | ICD-10-CM | POA: Diagnosis not present

## 2021-12-26 DIAGNOSIS — I10 Essential (primary) hypertension: Secondary | ICD-10-CM | POA: Diagnosis not present

## 2021-12-26 DIAGNOSIS — N3090 Cystitis, unspecified without hematuria: Secondary | ICD-10-CM | POA: Diagnosis not present

## 2021-12-28 DIAGNOSIS — I480 Paroxysmal atrial fibrillation: Secondary | ICD-10-CM | POA: Diagnosis not present

## 2021-12-28 DIAGNOSIS — I10 Essential (primary) hypertension: Secondary | ICD-10-CM | POA: Diagnosis not present

## 2021-12-28 DIAGNOSIS — F339 Major depressive disorder, recurrent, unspecified: Secondary | ICD-10-CM | POA: Diagnosis not present

## 2022-01-18 DIAGNOSIS — R350 Frequency of micturition: Secondary | ICD-10-CM | POA: Diagnosis not present

## 2022-01-18 DIAGNOSIS — J309 Allergic rhinitis, unspecified: Secondary | ICD-10-CM | POA: Diagnosis not present

## 2022-01-18 DIAGNOSIS — R059 Cough, unspecified: Secondary | ICD-10-CM | POA: Diagnosis not present

## 2022-01-18 DIAGNOSIS — Z6825 Body mass index (BMI) 25.0-25.9, adult: Secondary | ICD-10-CM | POA: Diagnosis not present

## 2022-01-31 DIAGNOSIS — R4582 Worries: Secondary | ICD-10-CM | POA: Diagnosis not present

## 2022-01-31 DIAGNOSIS — M5432 Sciatica, left side: Secondary | ICD-10-CM | POA: Diagnosis not present

## 2022-01-31 DIAGNOSIS — R609 Edema, unspecified: Secondary | ICD-10-CM | POA: Diagnosis not present

## 2022-01-31 DIAGNOSIS — M48062 Spinal stenosis, lumbar region with neurogenic claudication: Secondary | ICD-10-CM | POA: Diagnosis not present

## 2022-01-31 DIAGNOSIS — M1990 Unspecified osteoarthritis, unspecified site: Secondary | ICD-10-CM | POA: Diagnosis not present

## 2022-01-31 DIAGNOSIS — R002 Palpitations: Secondary | ICD-10-CM | POA: Diagnosis not present

## 2022-01-31 DIAGNOSIS — M545 Low back pain, unspecified: Secondary | ICD-10-CM | POA: Diagnosis not present

## 2022-01-31 DIAGNOSIS — I10 Essential (primary) hypertension: Secondary | ICD-10-CM | POA: Diagnosis not present

## 2022-02-07 DIAGNOSIS — K602 Anal fissure, unspecified: Secondary | ICD-10-CM | POA: Diagnosis not present

## 2022-02-07 DIAGNOSIS — Z1212 Encounter for screening for malignant neoplasm of rectum: Secondary | ICD-10-CM | POA: Diagnosis not present

## 2022-02-07 DIAGNOSIS — Z6825 Body mass index (BMI) 25.0-25.9, adult: Secondary | ICD-10-CM | POA: Diagnosis not present

## 2022-02-20 DIAGNOSIS — Z6825 Body mass index (BMI) 25.0-25.9, adult: Secondary | ICD-10-CM | POA: Diagnosis not present

## 2022-02-20 DIAGNOSIS — N3090 Cystitis, unspecified without hematuria: Secondary | ICD-10-CM | POA: Diagnosis not present

## 2022-03-07 ENCOUNTER — Encounter: Payer: Self-pay | Admitting: Internal Medicine

## 2022-03-22 ENCOUNTER — Ambulatory Visit (INDEPENDENT_AMBULATORY_CARE_PROVIDER_SITE_OTHER): Payer: PPO | Admitting: Internal Medicine

## 2022-03-22 ENCOUNTER — Encounter: Payer: Self-pay | Admitting: Internal Medicine

## 2022-03-22 VITALS — BP 158/69 | HR 71 | Temp 98.2°F | Ht 66.0 in | Wt 161.4 lb

## 2022-03-22 DIAGNOSIS — K581 Irritable bowel syndrome with constipation: Secondary | ICD-10-CM | POA: Diagnosis not present

## 2022-03-22 DIAGNOSIS — R110 Nausea: Secondary | ICD-10-CM | POA: Diagnosis not present

## 2022-03-22 DIAGNOSIS — F1190 Opioid use, unspecified, uncomplicated: Secondary | ICD-10-CM | POA: Diagnosis not present

## 2022-03-22 DIAGNOSIS — R1032 Left lower quadrant pain: Secondary | ICD-10-CM | POA: Diagnosis not present

## 2022-03-22 NOTE — Progress Notes (Signed)
Primary Care Physician:  Manon Hilding, MD Primary Gastroenterologist:  Dr. Abbey Chatters  Chief Complaint  Patient presents with   Rectal Bleeding    Patient arrives with daughter Renda. Referred for rectal bleeding, abdominal pain, nausea and constipation.     HPI:   Tanya Mueller is a 87 y.o. female who presents to clinic today by referral from her PCP Dr. Quintin Alto for evaluation.  Patient states for many months she has had left lower quadrant abdominal discomfort.  Relieved with bowel movements.  Also notes severe constipation.  Currently taking Dulcolax and stool softeners.  Will have a bowel movement once per week.  Chronically on Percocet for back pain.  Has had back surgeries in the past.  No melena.  Does note 3-4 episodes of rectal bleeding, significant, filling bowl with blood.  Has not had any bleeding in over 6 weeks however.  No rectal pain or discomfort.  Some associated nausea.  A lot of her symptoms do improve after bowel movements.  Last colonoscopy "many years ago."  No family history of colorectal malignancy.  Past Medical History:  Diagnosis Date   Allergic rhinitis    Arthritis    Back ,hips   Complication of anesthesia    awareness during general anesthesia.   Dysrhythmia    A-Fib and PSVT   History of atrial fibrillation    episode 2013 converted with cardizem-- happened at time acute illness due to kidney stone --  resolved  no issues since   History of sepsis    urosepsis  2013 due to kidney stone   Hypertension    Left nephrolithiasis    Left ureteral calculus    PAC (premature atrial contraction)    Solitary lung nodule    benign right middle lobe per CT    Past Surgical History:  Procedure Laterality Date   CATARACT EXTRACTION W/ INTRAOCULAR LENS  IMPLANT, BILATERAL  2010   CYSTOSCOPY WITH BIOPSY N/A 04/08/2019   Procedure: CYSTOSCOPY WITH bladder BIOPSY, FULGURATION;  Surgeon: Festus Aloe, MD;  Location: [facility];  Service: Urology;  Laterality: N/A;    CYSTOSCOPY WITH STENT PLACEMENT Left 09/15/2014   Procedure: CYSTOSCOPY WITH STENT PLACEMENT;  Surgeon: Georgette Dover, MD;  Location: [facility];  Service: Urology;  Laterality: Left;   CYSTOSCOPY/RETROGRADE/URETEROSCOPY Left 08/09/2014   Procedure: CYSTOSCOPY/RETROGRADE/LEFT STENT;  Surgeon: Bernestine Amass, MD;  Location: [facility];  Service: Urology;  Laterality: Left;   EXTRACORPOREAL SHOCK WAVE LITHOTRIPSY  2013   HOLMIUM LASER APPLICATION Left 91/63/8466   Procedure: HOLMIUM LASER APPLICATION;  Surgeon: Georgette Dover, MD;  Location: [facility];  Service: Urology;  Laterality: Left;   LAPAROTOMY W/  UNILATERAL SALPINGOOPHORECTOMY  age 43   TOTAL ABDOMINAL HYSTERECTOMY  age 79   w/ unilateral salpingoophorectomy   TRANSTHORACIC ECHOCARDIOGRAM  02-13-2012   mild LVH/  ef 65%/  trivial MR  &  PR/  mild RVE   URETEROSCOPY Left 09/15/2014   Procedure: URETEROSCOPY;  Surgeon: Georgette Dover, MD;  Location: [facility];  Service: Urology;  Laterality: Left;    Current Outpatient Medications  Medication Sig Dispense Refill   amLODipine (NORVASC) 5 MG tablet TAKE 1 TABLET ONCE DAILY. (Patient taking differently: Take 5 mg by mouth daily.) 90 tablet 3   fluticasone (FLONASE) 50 MCG/ACT nasal spray Place 2 sprays into the nose daily as needed for allergies.      ibuprofen (ADVIL) 200 MG tablet Take 200  mg by mouth. Takes as needed     losartan (COZAAR) 100 MG tablet TAKE 1 TABLET ONCE DAILY. (Patient taking differently: Take 100 mg by mouth daily.) 90 tablet 2   Magnesium Hydroxide (DULCOLAX PO) Take by mouth. 2 daily as needed.     metoprolol tartrate (LOPRESSOR) 25 MG tablet TAKE (1) TABLET TWICE DAILY. (Patient taking differently: Take 25 mg by mouth 2 (two) times daily.) 180 tablet 1   nitrofurantoin, macrocrystal-monohydrate, (MACROBID) 100 MG capsule Take 100 mg by mouth daily.     oxyCODONE-acetaminophen (PERCOCET) 7.5-325 MG tablet  Take 1 tablet by mouth every 6 (six) hours as needed for severe pain.      vitamin E 180 MG (400 UNITS) capsule Take 400 Units by mouth daily.     No current facility-administered medications for this visit.    Allergies as of 03/22/2022   (No Known Allergies)    Family History  Problem Relation Age of Onset   Cancer Other        Lung and bladder   Cancer Other        "Bone marrow cancer"    Social History   Socioeconomic History   Marital status: Married    Spouse name: Not on file   Number of children: 1   Years of education: Not on file   Highest education level: Not on file  Occupational History    Employer: RETIRED  Tobacco Use   Smoking status: Never    Passive exposure: Never   Smokeless tobacco: Never  Vaping Use   Vaping Use: Never used  Substance and Sexual Activity   Alcohol use: No   Drug use: No   Sexual activity: Not on file  Other Topics Concern   Not on file  Social History Narrative   Not on file   Social Determinants of Health   Financial Resource Strain: Not on file  Food Insecurity: Not on file  Transportation Needs: Not on file  Physical Activity: Not on file  Stress: Not on file  Social Connections: Not on file  Intimate Partner Violence: Not on file    Subjective: Review of Systems  Constitutional:  Negative for chills and fever.  HENT:  Negative for congestion and hearing loss.   Eyes:  Negative for blurred vision and double vision.  Respiratory:  Negative for cough and shortness of breath.   Cardiovascular:  Negative for chest pain and palpitations.  Gastrointestinal:  Positive for abdominal pain, constipation and nausea. Negative for blood in stool, diarrhea, heartburn, melena and vomiting.  Genitourinary:  Negative for dysuria and urgency.  Musculoskeletal:  Negative for joint pain and myalgias.  Skin:  Negative for itching and rash.  Neurological:  Negative for dizziness and headaches.  Psychiatric/Behavioral:  Negative for  depression. The patient is not nervous/anxious.        Objective: BP (!) 158/69 (BP Location: Left Arm, Patient Position: Sitting, Cuff Size: Large)   Pulse 71   Temp 98.2 F (36.8 C) (Oral)   Ht '5\' 6"'$  (1.676 m)   Wt 161 lb 6.4 oz (73.2 kg)   BMI 26.05 kg/m  Physical Exam Constitutional:      Appearance: Normal appearance.  HENT:     Head: Normocephalic and atraumatic.  Eyes:     Extraocular Movements: Extraocular movements intact.     Conjunctiva/sclera: Conjunctivae normal.  Cardiovascular:     Rate and Rhythm: Normal rate and regular rhythm.  Pulmonary:     Effort: Pulmonary effort  is normal.     Breath sounds: Normal breath sounds.  Abdominal:     General: Bowel sounds are normal.     Palpations: Abdomen is soft.  Musculoskeletal:        General: No swelling. Normal range of motion.     Cervical back: Normal range of motion and neck supple.  Skin:    General: Skin is warm and dry.     Coloration: Skin is not jaundiced.  Neurological:     General: No focal deficit present.     Mental Status: She is alert and oriented to person, place, and time.  Psychiatric:        Mood and Affect: Mood normal.        Behavior: Behavior normal.      Assessment/plan:  1.  Irritable bowel syndrome-constipation predominant, worsened by chronic opioid use, has tried and failed over-the-counter medications including Dulcolax and stool softeners.  We will give samples of Linzess 290 mcg daily.  Counseled on initial washout.  She understands.  Patient to call next week if symptoms improved and I will send in formal prescription.  2.  Abdominal pain-due to above, Linzess as above  3.  Rectal bleeding-3-4 episodes of rectal bleeding, no blood in over 6 weeks.  Likely hemorrhoidal in the setting of chronic constipation.  Discussed conservative measures versus colonoscopy to further evaluate.  Patient elects for conservative measures for now which is reasonable given her age.  I did  counsel her that there is a possibility we could be missing something more ominous in her colon including polyp/malignancy and she understands.  4.  Nausea-mild, intermittent, possible consequence due to severe constipation.  Continue to monitor for now.  Thank you Dr. Quintin Alto for the kind referral.  03/22/2022 9:52 AM   Disclaimer: This note was dictated with voice recognition software. Similar sounding words can inadvertently be transcribed and may not be corrected upon review.

## 2022-03-22 NOTE — Patient Instructions (Signed)
For your chronic constipation and abdominal discomfort, I am going to give you samples of a medication called Linzess 290 mcg daily.  Let us know next week if this is helping and I will send in formal prescription.  We do have room to decrease dose if needed.  You will likely have an initial washout period the first 2 to 3 days.  This should steadily get better.  Be sure to drink at least 6 glasses of water daily.  Otherwise follow-up in 3 to 4 months.  It was very nice meeting both of you today.  Dr. Abbey Chatters  At [facility] we value your feedback. You may receive a survey about your visit today. Please share your experience as we strive to create trusting relationships with our patients to provide genuine, compassionate, quality care.  We appreciate your understanding and patience as we review any laboratory studies, imaging, and other diagnostic tests that are ordered as we care for you. Our office policy is 5 business days for review of these results, and any emergent or urgent results are addressed in a timely manner for your best interest. If you do not hear from our office in 1 week, please contact us.   We also encourage the use of MyChart, which contains your medical information for your review as well. If you are not enrolled in this feature, an access code is on this after visit summary for your convenience. Thank you for allowing [place] to be involved in your care.  It was great to see you today!  I hope you have a great rest of your Spring!    Elon Alas. Abbey Chatters, D.O. Gastroenterology and Hepatology [facility]

## 2022-03-27 ENCOUNTER — Telehealth: Payer: Self-pay

## 2022-03-27 ENCOUNTER — Telehealth: Payer: Self-pay | Admitting: Internal Medicine

## 2022-03-27 NOTE — Telephone Encounter (Signed)
Pt called and advised that the Linzess works but she is not sure if her still taking 2 dulcolax a day helped it or not. Pt does want a Rx sent to her pharmacy

## 2022-03-30 ENCOUNTER — Other Ambulatory Visit: Payer: Self-pay | Admitting: Internal Medicine

## 2022-03-30 MED ORDER — LINACLOTIDE 290 MCG PO CAPS: 290.0000 ug | ORAL_CAPSULE | Freq: Every day | ORAL | 3 refills | Status: DC

## 2022-03-30 NOTE — Telephone Encounter (Signed)
Linzess 290 mcg sent to pharmacy. She does not need to take Dulcolax on top of this if Linzess working by itself. Thank you

## 2022-03-31 NOTE — Telephone Encounter (Signed)
Phoned and advised the pt of her Rx for Linzess 290 mcg being sent in and instructions to Dulcolax. Pt expressed understanding

## 2022-04-06 DIAGNOSIS — Z6825 Body mass index (BMI) 25.0-25.9, adult: Secondary | ICD-10-CM | POA: Diagnosis not present

## 2022-04-06 DIAGNOSIS — N390 Urinary tract infection, site not specified: Secondary | ICD-10-CM | POA: Diagnosis not present

## 2022-04-06 DIAGNOSIS — R300 Dysuria: Secondary | ICD-10-CM | POA: Diagnosis not present

## 2022-04-10 ENCOUNTER — Telehealth: Payer: Self-pay | Admitting: Internal Medicine

## 2022-04-10 NOTE — Telephone Encounter (Signed)
Returned the pt's call and was advised the Bentley worked one day and since then she still has to take Dulcolax with it to use the bathroom. It has been 3 or 4 days now since her last BM. She states she is miserable ( bloating, feels urge to go but she cannot, pain in abd until she takes a Dulcolax and then she can use the bathroom). We have a little of Trulance, Movantik, and Symporic samples here if you want her to try a sample). Please advise

## 2022-04-10 NOTE — Telephone Encounter (Signed)
Given her chronic Percocet use, Movantik might be a better option.  Lets give her samples of this.  Thank you

## 2022-04-10 NOTE — Telephone Encounter (Signed)
Noted   Phoned and advised the pt of the samples being left up front for her and directions. Pt will pick up and advise [place] next week how it did.

## 2022-04-10 NOTE — Telephone Encounter (Signed)
Patient called and said that dr Tanya Mueller got her to try linzess and it has not worked at all.  Please advise.

## 2022-04-12 NOTE — Telephone Encounter (Signed)
Pt's daughter called  stating that the Movantik caused the pt to be a little nervous/jittery but was better as the day went on. Pt took it again this morning and it did the same but not as bad. Pt wanted you to be aware and to make sure she should still take it. Pt has been able to have a bm since taking it. Please advise.

## 2022-04-13 NOTE — Telephone Encounter (Signed)
Pt was made aware and verbalized understanding.  

## 2022-04-13 NOTE — Telephone Encounter (Signed)
It seems like day 2 she tolerated it better based on what you are telling me?  I would stay on it and see how she does.  Hopefully as her body adjusts to the medication, these side effects will lessen.

## 2022-04-19 ENCOUNTER — Other Ambulatory Visit: Payer: Self-pay | Admitting: Internal Medicine

## 2022-04-19 MED ORDER — ONDANSETRON HCL 4 MG PO TABS: 4.0000 mg | ORAL_TABLET | Freq: Three times a day (TID) | ORAL | 2 refills | Status: DC | PRN

## 2022-04-19 MED ORDER — NALOXEGOL OXALATE 25 MG PO TABS: 25.0000 mg | ORAL_TABLET | Freq: Every day | ORAL | 3 refills | Status: DC

## 2022-04-19 NOTE — Telephone Encounter (Signed)
Noted    Phoned the pt/pt's daughter and advised of both Rx's being sent to [facility] in [place]. Understanding was expressed

## 2022-04-19 NOTE — Telephone Encounter (Signed)
If her bowels are moving on Movantik then I would continue it.  I will send in formal prescription.  I will send in Zofran to take as needed for nausea.  Thank you

## 2022-04-19 NOTE — Telephone Encounter (Signed)
Returned the pt's daughter call (Rinda) and was advised her mother is having BM's so Movantik is working but she has had some nausea with it. Her being jittery is slowing going away (but better than last week). They want to know what will be the next step. A formal Rx with something for nausea or try something else. Please advise

## 2022-04-20 NOTE — Telephone Encounter (Signed)
Can we try patient assistance?

## 2022-04-20 NOTE — Telephone Encounter (Signed)
Pt's daughter phoned and LMOVM to call advising that the Rx phoned in was too expensive and wants something cheaper. LMOVM for pt's daughter to return call

## 2022-04-21 NOTE — Telephone Encounter (Signed)
Yes we can try pt assistance or a PA waiting for pt's daughter to return call

## 2022-04-21 NOTE — Telephone Encounter (Signed)
Phoned and LMOVM for the pt's daughter to return call (Renda @ [phone]

## 2022-04-24 DIAGNOSIS — R300 Dysuria: Secondary | ICD-10-CM | POA: Diagnosis not present

## 2022-04-24 DIAGNOSIS — Z6825 Body mass index (BMI) 25.0-25.9, adult: Secondary | ICD-10-CM | POA: Diagnosis not present

## 2022-04-24 DIAGNOSIS — N3090 Cystitis, unspecified without hematuria: Secondary | ICD-10-CM | POA: Diagnosis not present

## 2022-04-24 NOTE — Telephone Encounter (Signed)
Phoned and LMOVM of the pt's daughter regarding the PA for medication and the cost.

## 2022-04-24 NOTE — Telephone Encounter (Signed)
Pt's Movantik was approved. Advised the pharmacist and the pt's cost is $100.00. pharmacist stated he would advised the pt and Jimmy. Documentation sent to scan for pt's chart

## 2022-04-24 NOTE — Telephone Encounter (Signed)
Phoned and spoke with the pt's daughter Rinda and advised her that I would do a PA on this pt's Movantik and she if I can get it approved and if needed we will move to financial assistance for the pt. Rinda agreed.  PA done for Movantik 25 mg. Tried/failed: Linzess 290 mcg, stool studies and Dulcolax. Dx: K58.1 IBS-C and F11.90 chronic continous use of opoids. Pt has chronic constipaiton. Ov was sent along to Cover My Meds.

## 2022-04-28 DIAGNOSIS — E7849 Other hyperlipidemia: Secondary | ICD-10-CM | POA: Diagnosis not present

## 2022-04-28 DIAGNOSIS — N183 Chronic kidney disease, stage 3 unspecified: Secondary | ICD-10-CM | POA: Diagnosis not present

## 2022-04-28 DIAGNOSIS — E871 Hypo-osmolality and hyponatremia: Secondary | ICD-10-CM | POA: Diagnosis not present

## 2022-04-28 DIAGNOSIS — E876 Hypokalemia: Secondary | ICD-10-CM | POA: Diagnosis not present

## 2022-05-02 DIAGNOSIS — M1990 Unspecified osteoarthritis, unspecified site: Secondary | ICD-10-CM | POA: Diagnosis not present

## 2022-05-02 DIAGNOSIS — M5432 Sciatica, left side: Secondary | ICD-10-CM | POA: Diagnosis not present

## 2022-05-02 DIAGNOSIS — R609 Edema, unspecified: Secondary | ICD-10-CM | POA: Diagnosis not present

## 2022-05-02 DIAGNOSIS — N183 Chronic kidney disease, stage 3 unspecified: Secondary | ICD-10-CM | POA: Diagnosis not present

## 2022-05-02 DIAGNOSIS — R002 Palpitations: Secondary | ICD-10-CM | POA: Diagnosis not present

## 2022-05-02 DIAGNOSIS — R4582 Worries: Secondary | ICD-10-CM | POA: Diagnosis not present

## 2022-05-02 DIAGNOSIS — I10 Essential (primary) hypertension: Secondary | ICD-10-CM | POA: Diagnosis not present

## 2022-05-02 DIAGNOSIS — M545 Low back pain, unspecified: Secondary | ICD-10-CM | POA: Diagnosis not present

## 2022-05-02 DIAGNOSIS — Z6826 Body mass index (BMI) 26.0-26.9, adult: Secondary | ICD-10-CM | POA: Diagnosis not present

## 2022-05-02 DIAGNOSIS — M47816 Spondylosis without myelopathy or radiculopathy, lumbar region: Secondary | ICD-10-CM | POA: Diagnosis not present

## 2022-05-02 DIAGNOSIS — M48062 Spinal stenosis, lumbar region with neurogenic claudication: Secondary | ICD-10-CM | POA: Diagnosis not present

## 2022-05-02 DIAGNOSIS — R5383 Other fatigue: Secondary | ICD-10-CM | POA: Diagnosis not present

## 2022-05-03 NOTE — Progress Notes (Unsigned)
Referring Provider: Manon Hilding, MD Primary Care Physician:  Manon Hilding, MD Primary GI Physician: Dr. Abbey Chatters  No chief complaint on file.   HPI:   Tanya Mueller is a 87 y.o. female presenting today for follow-up.  Last seen in our office 03/22/2022 at the time of initial consult.  She was referred for rectal bleeding, abdominal pain, nausea, constipation.  She reported several months of left lower quadrant abdominal discomfort relieved by bowel movements, but struggling with severe constipation currently taking Dulcolax and stool softeners having a bowel movement about once a week.  Reported 3-4 episodes of significant rectal bleeding, but last episode over 6 weeks ago.  Some associated nausea, but this also improves with bowel movements.  Last colonoscopy many years ago.  No family history of colon cancer.  She was provided samples of Linzess 290 mcg.  Discussed colonoscopy versus conservative measures for rectal bleeding.  Patient preferred conservative measures given her age and holding off on colonoscopy.  Patient called in July reporting Linzess was not working.  She was read as samples of Movantik.  She reported Movantik helped, but caused some jitteriness that seem to be going away with time.  Also felt that Movantik may be causing nausea.  Prescription for Zofran was also sent to pharmacy.  PA was completed for Movantik and the cost was going to be $100.  Today:    Past Medical History:  Diagnosis Date   Allergic rhinitis    Arthritis    Back ,hips   Complication of anesthesia    awareness during general anesthesia.   Dysrhythmia    A-Fib and PSVT   History of atrial fibrillation    episode 2013 converted with cardizem-- happened at time acute illness due to kidney stone --  resolved  no issues since   History of sepsis    urosepsis  2013 due to kidney stone   Hypertension    Left nephrolithiasis    Left ureteral calculus    PAC (premature atrial contraction)     Solitary lung nodule    benign right middle lobe per CT    Past Surgical History:  Procedure Laterality Date   CATARACT EXTRACTION W/ INTRAOCULAR LENS  IMPLANT, BILATERAL  2010   CYSTOSCOPY WITH BIOPSY N/A 04/08/2019   Procedure: CYSTOSCOPY WITH bladder BIOPSY, FULGURATION;  Surgeon: Festus Aloe, MD;  Location: [facility];  Service: Urology;  Laterality: N/A;   CYSTOSCOPY WITH STENT PLACEMENT Left 09/15/2014   Procedure: CYSTOSCOPY WITH STENT PLACEMENT;  Surgeon: Georgette Dover, MD;  Location: [facility];  Service: Urology;  Laterality: Left;   CYSTOSCOPY/RETROGRADE/URETEROSCOPY Left 08/09/2014   Procedure: CYSTOSCOPY/RETROGRADE/LEFT STENT;  Surgeon: Bernestine Amass, MD;  Location: [facility];  Service: Urology;  Laterality: Left;   EXTRACORPOREAL SHOCK WAVE LITHOTRIPSY  2013   HOLMIUM LASER APPLICATION Left 37/07/6268   Procedure: HOLMIUM LASER APPLICATION;  Surgeon: Georgette Dover, MD;  Location: [facility];  Service: Urology;  Laterality: Left;   LAPAROTOMY W/  UNILATERAL SALPINGOOPHORECTOMY  age 62   TOTAL ABDOMINAL HYSTERECTOMY  age 34   w/ unilateral salpingoophorectomy   TRANSTHORACIC ECHOCARDIOGRAM  02-13-2012   mild LVH/  ef 65%/  trivial MR  &  PR/  mild RVE   URETEROSCOPY Left 09/15/2014   Procedure: URETEROSCOPY;  Surgeon: Georgette Dover, MD;  Location: [facility];  Service: Urology;  Laterality: Left;    Current Outpatient Medications  Medication Sig Dispense Refill  amLODipine (NORVASC) 5 MG tablet TAKE 1 TABLET ONCE DAILY. (Patient taking differently: Take 5 mg by mouth daily.) 90 tablet 3   fluticasone (FLONASE) 50 MCG/ACT nasal spray Place 2 sprays into the nose daily as needed for allergies.      ibuprofen (ADVIL) 200 MG tablet Take 200 mg by mouth. Takes as needed     linaclotide (LINZESS) 290 MCG CAPS capsule Take 1 capsule (290 mcg total) by mouth daily before breakfast. 90 capsule 3   losartan (COZAAR) 100 MG  tablet TAKE 1 TABLET ONCE DAILY. (Patient taking differently: Take 100 mg by mouth daily.) 90 tablet 2   Magnesium Hydroxide (DULCOLAX PO) Take by mouth. 2 daily as needed.     metoprolol tartrate (LOPRESSOR) 25 MG tablet TAKE (1) TABLET TWICE DAILY. (Patient taking differently: Take 25 mg by mouth 2 (two) times daily.) 180 tablet 1   naloxegol oxalate (MOVANTIK) 25 MG TABS tablet Take 1 tablet (25 mg total) by mouth daily. 90 tablet 3   nitrofurantoin, macrocrystal-monohydrate, (MACROBID) 100 MG capsule Take 100 mg by mouth daily.     ondansetron (ZOFRAN) 4 MG tablet Take 1 tablet (4 mg total) by mouth every 8 (eight) hours as needed for nausea or vomiting. 30 tablet 2   oxyCODONE-acetaminophen (PERCOCET) 7.5-325 MG tablet Take 1 tablet by mouth every 6 (six) hours as needed for severe pain.      vitamin E 180 MG (400 UNITS) capsule Take 400 Units by mouth daily.     No current facility-administered medications for this visit.    Allergies as of 05/04/2022   (No Known Allergies)    Family History  Problem Relation Age of Onset   Cancer Other        Lung and bladder   Cancer Other        "Bone marrow cancer"    Social History   Socioeconomic History   Marital status: Married    Spouse name: Not on file   Number of children: 1   Years of education: Not on file   Highest education level: Not on file  Occupational History    Employer: RETIRED  Tobacco Use   Smoking status: Never    Passive exposure: Never   Smokeless tobacco: Never  Vaping Use   Vaping Use: Never used  Substance and Sexual Activity   Alcohol use: No   Drug use: No   Sexual activity: Not on file  Other Topics Concern   Not on file  Social History Narrative   Not on file   Social Determinants of Health   Financial Resource Strain: Not on file  Food Insecurity: Not on file  Transportation Needs: Not on file  Physical Activity: Not on file  Stress: Not on file  Social Connections: Not on file     Review of Systems: Gen: Denies fever, chills, cold or flu like symptoms, pre-syncope, or syncope.  CV: Denies chest pain, palpitations. Resp: Denies dyspnea at rest, cough. GI: See HPI Heme: See HPI  Physical Exam: There were no vitals taken for this visit. General:   Alert and oriented. No distress noted. Pleasant and cooperative.  Head:  Normocephalic and atraumatic. Eyes:  Conjuctiva clear without scleral icterus. Heart:  S1, S2 present without murmurs appreciated. Lungs:  Clear to auscultation bilaterally. No wheezes, rales, or rhonchi. No distress.  Abdomen:  +BS, soft, non-tender and non-distended. No rebound or guarding. No HSM or masses noted. Msk:  Symmetrical without gross deformities. Normal posture. Extremities:  Without edema. Neurologic:  Alert and  oriented x4 Psych:  Normal mood and affect.    Assessment:     Plan:  ***   Aliene Altes, PA-C [facility] 05/04/2022

## 2022-05-04 ENCOUNTER — Ambulatory Visit: Payer: PPO | Admitting: Gastroenterology

## 2022-05-04 ENCOUNTER — Encounter: Payer: Self-pay | Admitting: Gastroenterology

## 2022-05-04 VITALS — BP 151/63 | HR 62 | Temp 97.7°F | Ht 66.0 in | Wt 159.8 lb

## 2022-05-04 DIAGNOSIS — R110 Nausea: Secondary | ICD-10-CM | POA: Diagnosis not present

## 2022-05-04 DIAGNOSIS — K5903 Drug induced constipation: Secondary | ICD-10-CM

## 2022-05-04 DIAGNOSIS — T402X5A Adverse effect of other opioids, initial encounter: Secondary | ICD-10-CM | POA: Insufficient documentation

## 2022-05-04 MED ORDER — LUBIPROSTONE 24 MCG PO CAPS: 24.0000 ug | ORAL_CAPSULE | Freq: Two times a day (BID) | ORAL | 3 refills | Status: DC

## 2022-05-04 NOTE — Patient Instructions (Signed)
Start Amitiza 24 mcg twice daily with a meal.  While waiting to start Amitiza, try completing a MiraLAX bowel prep.  Separate instructions provided below.  After you complete MiraLAX bowel prep, continue taking MiraLAX twice daily to keep your bowels moving.  Be sure to drink at least 64 ounces of water daily.  Consume plenty of fruits and vegetables daily to maintain adequate fiber intake.  You may continue to take Zofran as needed for mild nausea.  Let me know of any worsening symptoms.  We will plan to follow-up in 3 months.  Do not hesitate to call if you have any questions or concerns prior to your next visit.  It was nice meeting you today!  Aliene Altes, PA-C [facility]    MiraLAX bowel prep instructions: Purchase:  MIRALAX 238 gram bottle, 1 FLEET ENEMA, 1 box of DULCOLAX (All over the counter medications)  1 day prior to starting bowel prep:  START CLEAR LIQUID DIET AFTER YOUR LUNCH MEAL--NO SOLID FOODS!  Day of bowel prep:  CLEAR LIQUIDS ALL DAY--NO SOLID FOODS!  At 10:00 AM, take 2 DULCOLAX '5mg'$  tablets  At 12:00 PM, Mix 5 teaspoons of Miralax in any 4-6 ounces of CLEAR LIQUIDS (Gatorade) every hour for 5 hours until passing clear, watery stools. Be sure to drink 4 ounces of clear liquid 30 minutes after each dose of Miralax.   At 3:00 PM, take 2 Dulcolax '5mg'$  tablets  If bowels are not moving well and becoming watery by 6 PM, you may take 5 teaspoons of MiraLAX x3 more doses.

## 2022-05-05 ENCOUNTER — Emergency Department (HOSPITAL_COMMUNITY)
Admission: EM | Admit: 2022-05-05 | Discharge: 2022-05-05 | Disposition: A | Discharge status: Home / Self Care | Payer: PPO | Attending: Emergency Medicine | Admitting: Emergency Medicine

## 2022-05-05 ENCOUNTER — Emergency Department (HOSPITAL_COMMUNITY): Payer: PPO

## 2022-05-05 ENCOUNTER — Other Ambulatory Visit: Payer: Self-pay

## 2022-05-05 ENCOUNTER — Encounter (HOSPITAL_COMMUNITY): Payer: Self-pay | Admitting: Emergency Medicine

## 2022-05-05 DIAGNOSIS — K5903 Drug induced constipation: Secondary | ICD-10-CM | POA: Insufficient documentation

## 2022-05-05 DIAGNOSIS — K5900 Constipation, unspecified: Secondary | ICD-10-CM | POA: Diagnosis not present

## 2022-05-05 DIAGNOSIS — T507X5A Adverse effect of analeptics and opioid receptor antagonists, initial encounter: Secondary | ICD-10-CM | POA: Insufficient documentation

## 2022-05-05 DIAGNOSIS — R1032 Left lower quadrant pain: Secondary | ICD-10-CM | POA: Diagnosis not present

## 2022-05-05 LAB — URINALYSIS, ROUTINE W REFLEX MICROSCOPIC
Bacteria, UA: NONE SEEN
Bilirubin Urine: NEGATIVE
Glucose, UA: NEGATIVE mg/dL
Ketones, ur: NEGATIVE mg/dL
Nitrite: NEGATIVE
Protein, ur: NEGATIVE mg/dL
Specific Gravity, Urine: 1.005 (ref 1.005–1.030)
pH: 7 (ref 5.0–8.0)

## 2022-05-05 MED ORDER — METOCLOPRAMIDE HCL 10 MG PO TABS: 10.0000 mg | ORAL_TABLET | Freq: Four times a day (QID) | ORAL | 0 refills | Status: DC

## 2022-05-05 NOTE — ED Provider Notes (Signed)
[facility] EMERGENCY DEPARTMENT Provider Note   CSN: [id number] Arrival date & time: 05/05/22  1824     History  Chief Complaint  Patient presents with   Constipation    Tanya Mueller is a 87 y.o. female.   Constipation  This patient is an 87 year old female, unfortunately she suffers from chronic pain in her back and takes 7.5 mg oxycodone acetaminophen tablets every 6 hours as needed for pain however she has recently developed worsening constipation and is now been 8 days without a bowel movement, she was passing gas but states she is no longer passing gas.  She is not nauseated or vomiting.  She does not have any significant abdominal pain but feels very "jittery".  Her gastroenterologist has treated her with several different treatments including Linzess which made the patient feel very poorly, Amitiza which she is currently taking and was started on yesterday and today was placed on a MiraLAX cleanout and has had about 5 doses of MiraLAX but despite this has not had a bowel movement.  The patient is requesting additional assistance and concern over her not having a bowel movement.    Home Medications Prior to Admission medications   Medication Sig Start Date End Date Taking? Authorizing Provider  amLODipine (NORVASC) 5 MG tablet TAKE 1 TABLET ONCE DAILY. Patient taking differently: Take 5 mg by mouth daily. 11/04/18  Yes Herminio Commons, MD  fluticasone (FLONASE) 50 MCG/ACT nasal spray Place 2 sprays into the nose daily as needed for allergies.    Yes [provider]  ibuprofen (ADVIL) 200 MG tablet Take 200 mg by mouth. Takes as needed   Yes [provider]  losartan (COZAAR) 100 MG tablet TAKE 1 TABLET ONCE DAILY. Patient taking differently: Take 100 mg by mouth daily. 06/18/18  Yes Herminio Commons, MD  metoCLOPramide (REGLAN) 10 MG tablet Take 1 tablet (10 mg total) by mouth every 6 (six) hours. 05/05/22  Yes Noemi Chapel, MD  metoprolol tartrate  (LOPRESSOR) 25 MG tablet TAKE (1) TABLET TWICE DAILY. Patient taking differently: Take 25 mg by mouth 2 (two) times daily. 04/24/19  Yes Herminio Commons, MD  ondansetron (ZOFRAN) 4 MG tablet Take 1 tablet (4 mg total) by mouth every 8 (eight) hours as needed for nausea or vomiting. 04/19/22  Yes Eloise Harman, DO  oxyCODONE-acetaminophen (PERCOCET) 10-325 MG tablet Take 1 tablet by mouth 4 (four) times daily as needed. 05/02/22  Yes [provider]  vitamin E 180 MG (400 UNITS) capsule Take 400 Units by mouth daily.   Yes [provider]  lubiprostone (AMITIZA) 24 MCG capsule Take 1 capsule (24 mcg total) by mouth 2 (two) times daily with a meal. 05/04/22   Jodi Mourning, Tivis Ringer, PA-C  nitrofurantoin, macrocrystal-monohydrate, (MACROBID) 100 MG capsule Take 100 mg by mouth daily. Patient not taking: Reported on 05/05/2022 02/20/22   [provider]      Allergies    Patient has no known allergies.    Review of Systems   Review of Systems  Gastrointestinal:  Positive for constipation.  All other systems reviewed and are negative.   Physical Exam Updated Vital Signs BP (!) 177/73 (BP Location: Right Arm)   Pulse 73   Temp 98 F (36.7 C) (Oral)   Resp 16   Ht 1.689 m (5' 6.5")   Wt 77.1 kg   SpO2 98%   BMI 27.03 kg/m  Physical Exam Vitals and nursing note reviewed.  Constitutional:  General: She is not in acute distress.    Appearance: She is well-developed.  HENT:     Head: Normocephalic and atraumatic.     Nose: No congestion or rhinorrhea.     Mouth/Throat:     Mouth: Mucous membranes are moist.     Pharynx: No oropharyngeal exudate.  Eyes:     General: No scleral icterus.       Right eye: No discharge.        Left eye: No discharge.     Conjunctiva/sclera: Conjunctivae normal.     Pupils: Pupils are equal, round, and reactive to light.  Neck:     Thyroid: No thyromegaly.     Vascular: No JVD.  Cardiovascular:     Rate and Rhythm: Normal  rate and regular rhythm.     Heart sounds: Normal heart sounds. No murmur heard.    No friction rub. No gallop.  Pulmonary:     Effort: Pulmonary effort is normal. No respiratory distress.     Breath sounds: Normal breath sounds. No wheezing or rales.  Abdominal:     General: Bowel sounds are normal. There is no distension.     Palpations: Abdomen is soft. There is no mass.     Tenderness: There is abdominal tenderness.     Comments: Mild left lower quadrant tenderness, no masses, no guarding, no peritoneal signs  Musculoskeletal:        General: No tenderness. Normal range of motion.     Cervical back: Normal range of motion and neck supple.     Right lower leg: No edema.     Left lower leg: No edema.  Lymphadenopathy:     Cervical: No cervical adenopathy.  Skin:    General: Skin is warm and dry.     Findings: No erythema or rash.  Neurological:     Mental Status: She is alert.     Coordination: Coordination normal.  Psychiatric:        Behavior: Behavior normal.     ED Results / Procedures / Treatments   Labs (all labs ordered are listed, but only abnormal results are displayed) Labs Reviewed  URINALYSIS, ROUTINE W REFLEX MICROSCOPIC - Abnormal; Notable for the following components:      Result Value   Hgb urine dipstick SMALL (*)    Leukocytes,Ua TRACE (*)    All other components within normal limits  URINE CULTURE    EKG None  Radiology DG Abdomen 1 View  Result Date: 05/05/2022 CLINICAL DATA:  Constipation. EXAM: ABDOMEN - 1 VIEW COMPARISON:  12/09/2018, 11/08/2021. FINDINGS: The bowel gas pattern is normal. A mild-to-moderate amount of retained stool is noted in the colon. Renal calculi are noted on the left. Lumbar spinal fusion hardware is unchanged. IMPRESSION: 1. No evidence of bowel obstruction. 2. Mild to moderate residual stool in the colon. 3. Left renal calculi. Electronically Signed   By: Brett Fairy M.D.   On: 05/05/2022 20:39     Procedures Procedures    Medications Ordered in ED Medications - No data to display  ED Course/ Medical Decision Making/ A&P                           Medical Decision Making Amount and/or Complexity of Data Reviewed Labs: ordered. Radiology: ordered.  Risk Prescription drug management.   This patient presents to the ED for concern of constipation differential diagnosis includes opiate induced constipation, fecal impaction  Additional history obtained:  Additional history obtained from electronic medical record External records from outside source obtained and reviewed including GI notes from yesterday   Lab Tests:  I Ordered, and personally interpreted labs.  The pertinent results include: Urinalysis which shows urinalysis without significant findings of hematuria or leukocytes to any degree, no significant bacteria   Imaging Studies ordered:  I ordered imaging studies including KUB I independently visualized and interpreted imaging which showed fecal burden I agree with the radiologist interpretation   Medicines ordered and prescription drug management:  I ordered medication including metoclopramide for constipation Reevaluation of the patient after rectal exam  showed that the patient is unchanged and will try these medications at home I have reviewed the patients home medicines and have made adjustments as needed   Problem List / ED Course:  The patient was given a rectal exam while she was here, the chaperone was present with me throughout.  The patient had no significant amount of stool in the rectal vault, she had no bleeding, no masses, no hemorrhoids.  She was instructed exactly on the use of the medications that she is to take at home and expressed her understanding.  This was all given to her in written form as well.  She was instructed on her results including the x-ray, family member at the bedside was also given the results with the patient's  permission and they understand the indications for return and follow-up with GI   Social Determinants of Health:  None           Final Clinical Impression(s) / ED Diagnoses Final diagnoses:  Drug-induced constipation    Rx / DC Orders ED Discharge Orders          Ordered    metoCLOPramide (REGLAN) 10 MG tablet  Every 6 hours        05/05/22 2115              Noemi Chapel, MD 05/05/22 2119

## 2022-05-05 NOTE — ED Triage Notes (Signed)
Pt states she has not had a bowel movement in 8 days and started bowel prep today from gi doctor. Pt states she has not had any results from prep. Pt states she does not feel well and states she has not urinated very much today.

## 2022-05-05 NOTE — Discharge Instructions (Signed)
Unfortunately because of the medications that you take chronically for your pain you have experienced some constipation which may continue on for quite some time.  Unfortunately you have not tolerated many of the medicines that we usually use for constipation so your doctors have prescribed you some new medications including Amitiza to take twice a day as well as the MiraLAX.  I would like for you to take the medications exactly as follows  Amitiza twice daily  MiraLAX, 1 dose every 2 hours until you are having regular soft or loose bowel movements  You may continue to try to use stool softener such as Dulcolax, senna or Colace  Please make sure that you are drinking plenty of clear liquids as this will certainly help keep you hydrated.  The Reglan is another medication that you may take that may help to stimulate your bowels to move faster.  This medication does have a potential side effect of making someone feel jittery or stiff, if that occurs please take a Benadryl and come back to the hospital for reevaluation.  Please follow-up with your GI specialist within 3 days if no improvement or come back to the ER immediately for severe worsening symptoms including abdominal pain or vomiting

## 2022-05-07 LAB — URINE CULTURE: Culture: NO GROWTH

## 2022-05-20 DIAGNOSIS — T402X5A Adverse effect of other opioids, initial encounter: Secondary | ICD-10-CM | POA: Diagnosis not present

## 2022-05-20 DIAGNOSIS — N200 Calculus of kidney: Secondary | ICD-10-CM | POA: Diagnosis not present

## 2022-05-20 DIAGNOSIS — K5903 Drug induced constipation: Secondary | ICD-10-CM | POA: Diagnosis not present

## 2022-05-20 DIAGNOSIS — Z6825 Body mass index (BMI) 25.0-25.9, adult: Secondary | ICD-10-CM | POA: Diagnosis not present

## 2022-05-20 DIAGNOSIS — M48062 Spinal stenosis, lumbar region with neurogenic claudication: Secondary | ICD-10-CM | POA: Diagnosis not present

## 2022-05-20 DIAGNOSIS — N183 Chronic kidney disease, stage 3 unspecified: Secondary | ICD-10-CM | POA: Diagnosis not present

## 2022-05-20 DIAGNOSIS — N390 Urinary tract infection, site not specified: Secondary | ICD-10-CM | POA: Diagnosis not present

## 2022-06-15 DIAGNOSIS — N3090 Cystitis, unspecified without hematuria: Secondary | ICD-10-CM | POA: Diagnosis not present

## 2022-06-15 DIAGNOSIS — N390 Urinary tract infection, site not specified: Secondary | ICD-10-CM | POA: Diagnosis not present

## 2022-06-15 DIAGNOSIS — R002 Palpitations: Secondary | ICD-10-CM | POA: Diagnosis not present

## 2022-06-15 DIAGNOSIS — M545 Low back pain, unspecified: Secondary | ICD-10-CM | POA: Diagnosis not present

## 2022-06-15 DIAGNOSIS — Z6825 Body mass index (BMI) 25.0-25.9, adult: Secondary | ICD-10-CM | POA: Diagnosis not present

## 2022-06-15 DIAGNOSIS — R609 Edema, unspecified: Secondary | ICD-10-CM | POA: Diagnosis not present

## 2022-06-27 DIAGNOSIS — I10 Essential (primary) hypertension: Secondary | ICD-10-CM | POA: Diagnosis not present

## 2022-06-27 DIAGNOSIS — F419 Anxiety disorder, unspecified: Secondary | ICD-10-CM | POA: Diagnosis not present

## 2022-06-27 DIAGNOSIS — M5432 Sciatica, left side: Secondary | ICD-10-CM | POA: Diagnosis not present

## 2022-06-27 DIAGNOSIS — R600 Localized edema: Secondary | ICD-10-CM | POA: Diagnosis not present

## 2022-06-27 DIAGNOSIS — F339 Major depressive disorder, recurrent, unspecified: Secondary | ICD-10-CM | POA: Diagnosis not present

## 2022-07-06 ENCOUNTER — Encounter: Payer: Self-pay | Admitting: Urology

## 2022-07-06 ENCOUNTER — Ambulatory Visit: Payer: PPO | Admitting: Urology

## 2022-07-06 VITALS — BP 183/70 | HR 52

## 2022-07-06 DIAGNOSIS — N390 Urinary tract infection, site not specified: Secondary | ICD-10-CM | POA: Diagnosis not present

## 2022-07-06 DIAGNOSIS — N200 Calculus of kidney: Secondary | ICD-10-CM | POA: Diagnosis not present

## 2022-07-06 DIAGNOSIS — R3915 Urgency of urination: Secondary | ICD-10-CM

## 2022-07-06 DIAGNOSIS — R350 Frequency of micturition: Secondary | ICD-10-CM

## 2022-07-06 DIAGNOSIS — N3020 Other chronic cystitis without hematuria: Secondary | ICD-10-CM

## 2022-07-06 LAB — MICROSCOPIC EXAMINATION

## 2022-07-06 LAB — URINALYSIS, ROUTINE W REFLEX MICROSCOPIC
Bilirubin, UA: NEGATIVE
Glucose, UA: NEGATIVE
Ketones, UA: NEGATIVE
Nitrite, UA: POSITIVE — AB
Specific Gravity, UA: 1.01 (ref 1.005–1.030)
Urobilinogen, Ur: 2 mg/dL — ABNORMAL HIGH (ref 0.2–1.0)
pH, UA: 6 (ref 5.0–7.5)

## 2022-07-06 NOTE — Progress Notes (Signed)
Subjective: 1. Frequent UTI   2. Chronic cystitis   3. Renal stones   4. Urinary frequency   5. Urgency of urination      Consult requested by Consuello Masse MD  Tanya Mueller is an 87 yo female who is a former patient of Dr. Junious Silk in [place].  She had follicular cystitis on a biopsy in 7/20 and has a history of stones with multiple prior procedures.  She has a history of OAB and has failed vesicare and myrbetriq.   She last had a CT in in 2/23 and had a 2.3cm laminated stone in the left renal pelvis and smaller right renal stones.  There was a 18m stone in the LLP on a CT in 2020.   She has a chronic UTI that are becoming more frequency and she will generally benefit from a course of antibiotics.  The last oral antibiotic didn't help and needed a injection. She was in the ER on 05/05/22 with constipation.  A culture that day was negative.  She currently complains of malodorous urine over the last 2 weeks with malaise.  She has no dysuria.   She has some frequency and urgency.  She has not had recent hematuria.   She has had multilevel back surgery with fusion and her back pain gets worse with the infections, left > right.  Her UA looks infected.    ROS:  Review of Systems  Cardiovascular:  Positive for leg swelling.  Gastrointestinal:  Positive for constipation.  Genitourinary:  Positive for frequency and urgency.  Musculoskeletal:  Positive for back pain and joint pain.  All other systems reviewed and are negative.   No Known Allergies  Past Medical History:  Diagnosis Date   Allergic rhinitis    Arthritis    Back ,hips   Complication of anesthesia    awareness during general anesthesia.   Dysrhythmia    A-Fib and PSVT   History of atrial fibrillation    episode 2013 converted with cardizem-- happened at time acute illness due to kidney stone --  resolved  no issues since   History of sepsis    urosepsis  2013 due to kidney stone   Hypertension    Left nephrolithiasis    Left  ureteral calculus    PAC (premature atrial contraction)    Solitary lung nodule    benign right middle lobe per CT    Past Surgical History:  Procedure Laterality Date   CATARACT EXTRACTION W/ INTRAOCULAR LENS  IMPLANT, BILATERAL  2010   CYSTOSCOPY WITH BIOPSY N/A 04/08/2019   Procedure: CYSTOSCOPY WITH bladder BIOPSY, FULGURATION;  Surgeon: EFestus Aloe MD;  Location: [facility];  Service: Urology;  Laterality: N/A;   CYSTOSCOPY WITH STENT PLACEMENT Left 09/15/2014   Procedure: CYSTOSCOPY WITH STENT PLACEMENT;  Surgeon: MGeorgette Dover MD;  [facility]  Service: Urology;  Laterality: Left;   CYSTOSCOPY/RETROGRADE/URETEROSCOPY Left 08/09/2014   Procedure: CYSTOSCOPY/RETROGRADE/LEFT STENT;  Surgeon: DBernestine Amass MD;  Location: [facility];  Service: Urology;  Laterality: Left;   EXTRACORPOREAL SHOCK WAVE LITHOTRIPSY  2013   HOLMIUM LASER APPLICATION Left 157/32/2025  Procedure: HOLMIUM LASER APPLICATION;  Surgeon: MGeorgette Dover MD;  [facility]  Service: Urology;  Laterality: Left;   LAPAROTOMY W/  UNILATERAL SALPINGOOPHORECTOMY  age 89  TOTAL ABDOMINAL HYSTERECTOMY  age 87  w/ unilateral salpingoophorectomy   TRANSTHORACIC ECHOCARDIOGRAM  02-13-2012   mild LVH/  ef 65%/  trivial MR  &  PR/  mild RVE   URETEROSCOPY Left 09/15/2014   Procedure: URETEROSCOPY;  Surgeon: Georgette Dover, MD;  Location: [facility];  Service: Urology;  Laterality: Left;    Social History   Socioeconomic History   Marital status: Married    Spouse name: Not on file   Number of children: 1   Years of education: Not on file   Highest education level: Not on file  Occupational History    Employer: RETIRED  Tobacco Use   Smoking status: Never    Passive exposure: Never   Smokeless tobacco: Never  Vaping Use   Vaping Use: Never used  Substance and Sexual Activity   Alcohol use: No   Drug use: No   Sexual activity: Not on  file  Other Topics Concern   Not on file  Social History Narrative   Not on file   Social Determinants of Health   Financial Resource Strain: Not on file  Food Insecurity: Not on file  Transportation Needs: Not on file  Physical Activity: Not on file  Stress: Not on file  Social Connections: Not on file  Intimate Partner Violence: Not on file    Family History  Problem Relation Age of Onset   Cancer Other        Lung and bladder   Cancer Other        "Bone marrow cancer"    Anti-infectives: Anti-infectives (From admission, onward)    None       Current Outpatient Medications  Medication Sig Dispense Refill   fluticasone (FLONASE) 50 MCG/ACT nasal spray Place 2 sprays into the nose daily as needed for allergies.      ibuprofen (ADVIL) 200 MG tablet Take 200 mg by mouth. Takes as needed     losartan (COZAAR) 100 MG tablet TAKE 1 TABLET ONCE DAILY. (Patient taking differently: Take 100 mg by mouth daily.) 90 tablet 2   lubiprostone (AMITIZA) 24 MCG capsule Take 1 capsule (24 mcg total) by mouth 2 (two) times daily with a meal. 60 capsule 3   metoCLOPramide (REGLAN) 10 MG tablet Take 1 tablet (10 mg total) by mouth every 6 (six) hours. 30 tablet 0   metoprolol tartrate (LOPRESSOR) 25 MG tablet TAKE (1) TABLET TWICE DAILY. (Patient taking differently: Take 25 mg by mouth 2 (two) times daily.) 180 tablet 1   ondansetron (ZOFRAN) 4 MG tablet Take 1 tablet (4 mg total) by mouth every 8 (eight) hours as needed for nausea or vomiting. 30 tablet 2   oxyCODONE-acetaminophen (PERCOCET) 10-325 MG tablet Take 1 tablet by mouth 4 (four) times daily as needed.     vitamin E 180 MG (400 UNITS) capsule Take 400 Units by mouth daily.     No current facility-administered medications for this visit.     Objective: Vital signs in last 24 hours: BP (!) 183/70   Pulse (!) 52   Intake/Output from previous day: No intake/output data recorded. Intake/Output this  shift: '@IOTHISSHIFT'$ @   Physical Exam Vitals reviewed.  Constitutional:      Appearance: Normal appearance.  Cardiovascular:     Rate and Rhythm: Normal rate and regular rhythm.  Pulmonary:     Effort: Pulmonary effort is normal.     Breath sounds: Normal breath sounds.  Abdominal:     General: Abdomen is flat.     Palpations: Abdomen is soft.     Tenderness: There is no abdominal tenderness.  Neurological:     Mental Status: She  is alert.     Lab Results:  Results for orders placed or performed in visit on 07/06/22 (from the past 24 hour(s))  Urinalysis, Routine w reflex microscopic     Status: Abnormal   Collection Time: 07/06/22 10:01 AM  Result Value Ref Range   Specific Gravity, UA 1.010 1.005 - 1.030   pH, UA 6.0 5.0 - 7.5   Color, UA Yellow Yellow   Appearance Ur Cloudy (A) Clear   Leukocytes,UA 1+ (A) Negative   Protein,UA 1+ (A) Negative/Trace   Glucose, UA Negative Negative   Ketones, UA Negative Negative   RBC, UA 3+ (A) Negative   Bilirubin, UA Negative Negative   Urobilinogen, Ur 2.0 (H) 0.2 - 1.0 mg/dL   Nitrite, UA Positive (A) Negative   Microscopic Examination See below:    Narrative   Performed at:  [facility] [address], [place], [place]  [id number] Lab Director: Mina Marble MT, Phone:  [phone]  Microscopic Examination     Status: Abnormal   Collection Time: 07/06/22 10:01 AM   Urine  Result Value Ref Range   WBC, UA 11-30 (A) 0 - 5 /hpf   RBC, Urine 11-30 (A) 0 - 2 /hpf   Epithelial Cells (non renal) 0-10 0 - 10 /hpf   Bacteria, UA Many (A) None seen/Few   Narrative   Performed at:  [facility] [address], [place], [place]  [id number] Lab Director: Mina Marble MT, Phone:  [phone]    BMET No results for input(s): "NA", "K", "CL", "CO2", "GLUCOSE", "BUN", "CREATININE", "CALCIUM" in the last 72 hours. PT/INR No results for input(s): "LABPROT", "INR" in the last 72 hours. ABG No results for  input(s): "PHART", "HCO3" in the last 72 hours.  Invalid input(s): "PCO2", "PO2"  Studies/Results: No results found. DG Abdomen 1 View  Result Date: 05/05/2022 CLINICAL DATA:  Constipation. EXAM: ABDOMEN - 1 VIEW COMPARISON:  12/09/2018, 11/08/2021. FINDINGS: The bowel gas pattern is normal. A mild-to-moderate amount of retained stool is noted in the colon. Renal calculi are noted on the left. Lumbar spinal fusion hardware is unchanged. IMPRESSION: 1. No evidence of bowel obstruction. 2. Mild to moderate residual stool in the colon. 3. Left renal calculi. Electronically Signed   By: Brett Fairy M.D.   On: 05/05/2022 20:39    CLINICAL DATA:  Worsening left-sided abdominal pain for 1 week   EXAM: CT ABDOMEN AND PELVIS WITH CONTRAST   TECHNIQUE: Multidetector CT imaging of the abdomen and pelvis was performed using the standard protocol following bolus administration of intravenous contrast.   RADIATION DOSE REDUCTION: This exam was performed according to the departmental dose-optimization program which includes automated exposure control, adjustment of the mA and/or kV according to patient size and/or use of iterative reconstruction technique.   CONTRAST:  14m OMNIPAQUE IOHEXOL 300 MG/ML  SOLN   COMPARISON:  03/04/2019   FINDINGS: Lower chest: No acute pleural or parenchymal lung disease. Stable bibasilar scarring.   Hepatobiliary: No focal liver abnormality is seen. No gallstones, gallbladder wall thickening, or biliary dilatation.   Pancreas: Unremarkable. No pancreatic ductal dilatation or surrounding inflammatory changes.   Spleen: Normal in size without focal abnormality.   Adrenals/Urinary Tract: There is a large lamellated nonobstructing calculus within the left renal pelvis, measuring 2.3 by 1.5 x 2.0 cm. Multiple other nonobstructing left renal calculi are identified, largest in the lower pole calyx measuring 8 mm. There are multiple nonobstructing right renal  calculi, largest measuring  5 mm within the lower pole calyx. No obstructive uropathy within either kidney. Mild mucosal enhancement of the left ureter could reflect underlying infection.   Bladder is moderately distended without gross abnormality. The adrenals are normal.   Stomach/Bowel: No bowel obstruction or ileus. No bowel wall thickening or inflammatory change.   Vascular/Lymphatic: Aortic atherosclerosis. No enlarged abdominal or pelvic lymph nodes.   Reproductive: Status post hysterectomy. No adnexal masses.   Other: No free fluid or free gas.  No abdominal wall hernia.   Musculoskeletal: No acute or destructive bony lesions. Extensive postsurgical changes throughout the lumbar spine again noted. Reconstructed images demonstrate no additional findings.   IMPRESSION: 1. Bilateral nonobstructing renal calculi, left greater than right. 2. Mild enhancement of the left ureteral mucosa, nonspecific. This could reflect underlying infection. Please correlate with urinalysis. 3.  Aortic Atherosclerosis (ICD10-I70.0).     Electronically Signed   By: Randa Ngo M.D.   On: 11/08/2021 17:35  I have reviewed her records from [place].  Assessment/Plan: 2.3cm left renal pelvic stone with mild obstruction on CT in 2/23.   She needs a left PCNL and I have described the procedure and risks including bleeding, infection, urine leaks, renal and adjacent organ injury, need for secondary procedures, thrombotic events and anesthetic complications.  Chronic cystitis.   I will culture the urine and consider treatment with subsequent suppression but I think we need to get the stone out to have any chance of long term resolution of the infection.   No orders of the defined types were placed in this encounter.    Orders Placed This Encounter  Procedures   Urine Culture   Microscopic Examination   Urinalysis, Routine w reflex microscopic     Return for I will send a posting sheet to see  her up for PCNL.   f/u 2-3 weeks post op. .    CC: Dr. Consuello Masse.      Irine Seal 07/07/2022

## 2022-07-10 ENCOUNTER — Telehealth: Payer: Self-pay

## 2022-07-10 ENCOUNTER — Other Ambulatory Visit: Payer: Self-pay | Admitting: Physician Assistant

## 2022-07-10 DIAGNOSIS — N3020 Other chronic cystitis without hematuria: Secondary | ICD-10-CM

## 2022-07-10 LAB — URINE CULTURE

## 2022-07-10 MED ORDER — SULFAMETHOXAZOLE-TRIMETHOPRIM 800-160 MG PO TABS: 1.0000 | ORAL_TABLET | Freq: Two times a day (BID) | ORAL | 0 refills | Status: DC

## 2022-07-10 NOTE — Telephone Encounter (Signed)
-----   Message from Reynaldo Minium, [place] sent at 07/10/2022  4:20 PM EDT ----- Jaydn Moscato, please let pt know I am sending in Rx for Bactrim DS for positive cx indicating UTI. She needs appt before PCNL(which needs to be scheduled) to ensure her urine is clear.   ----- Message ----- From: Iris Pert, LPN Sent: 27/04/4127   4:03 PM EDT To: Irine Seal, MD; #  Please review in Dr. Ralene Muskrat absence  No tx started

## 2022-07-10 NOTE — Telephone Encounter (Signed)
Patient called and made aware of urine culture and rx sent to pharmacy.  Patient states that she will proceed with surgery in [place] at [facility].

## 2022-07-11 MED ORDER — TRIMETHOPRIM 100 MG PO TABS: 100.0000 mg | ORAL_TABLET | Freq: Every evening | ORAL | 5 refills | Status: DC

## 2022-07-11 NOTE — Telephone Encounter (Signed)
Patient called and made aware.  Patient states that she will call [facility] to confirm surgery.

## 2022-07-11 NOTE — Telephone Encounter (Signed)
-----   Message from Irine Seal, MD sent at 07/11/2022  8:04 AM EDT ----- After completing the bactrim that [facility] sent, I would like for her to be on Trimethoprim '100mg'$  po qhs #30 with 5 refills to suppress infection.  She will need f/u in 4-6 weeks.  ----- Message ----- From: Iris Pert, LPN Sent: 46/10/9010   4:03 PM EDT To: Irine Seal, MD; #  Please review in Dr. Ralene Muskrat absence  No tx started

## 2022-07-17 ENCOUNTER — Other Ambulatory Visit (HOSPITAL_COMMUNITY): Payer: Self-pay | Admitting: Urology

## 2022-07-17 ENCOUNTER — Other Ambulatory Visit: Payer: Self-pay | Admitting: Urology

## 2022-07-17 DIAGNOSIS — N200 Calculus of kidney: Secondary | ICD-10-CM

## 2022-07-17 NOTE — Progress Notes (Unsigned)
Referring Provider: Manon Hilding, MD Primary Care Physician:  Manon Hilding, MD Primary GI Physician: Dr. Abbey Chatters  No chief complaint on file.   HPI:   Tanya Mueller is a 87 y.o. female with history of chronic pain on chronic pain medications (taking 4 times daily), HTN, chronic constipation, presenting today for follow-up of chronic constipation.   Last seen in our office for the same on 05/04/22.  Reported over-the-counter medications had lost effect.  Tried Linzess back in June which was unhelpful.  Tried Movantik in July, but this caused significant jitteriness, more than she could handle.  She was not taking anything to help with bowel regularity at the time of her office visit.  Previously with few episodes of rectal bleeding in April/May, but no recurrent episodes and had requested to hold off on colonoscopy unless symptoms recur.  Plan to try Amitiza 24 mcg twice daily.  She is also given instructions on MiraLAX bowel prep.  She was seen in the emergency room 05/05/2022 with concerns about constipation.  She had abdominal x-ray which showed mild to moderate residual stool in the colon, no bowel obstruction.  She was discharged on Reglan every 6 hours.  Today:    Past Medical History:  Diagnosis Date   Allergic rhinitis    Arthritis    Back ,hips   Complication of anesthesia    awareness during general anesthesia.   Dysrhythmia    A-Fib and PSVT   History of atrial fibrillation    episode 2013 converted with cardizem-- happened at time acute illness due to kidney stone --  resolved  no issues since   History of sepsis    urosepsis  2013 due to kidney stone   Hypertension    Left nephrolithiasis    Left ureteral calculus    PAC (premature atrial contraction)    Solitary lung nodule    benign right middle lobe per CT    Past Surgical History:  Procedure Laterality Date   CATARACT EXTRACTION W/ INTRAOCULAR LENS  IMPLANT, BILATERAL  2010   CYSTOSCOPY WITH BIOPSY N/A  04/08/2019   Procedure: CYSTOSCOPY WITH bladder BIOPSY, FULGURATION;  Surgeon: Festus Aloe, MD;  Location: [facility];  Service: Urology;  Laterality: N/A;   CYSTOSCOPY WITH STENT PLACEMENT Left 09/15/2014   Procedure: CYSTOSCOPY WITH STENT PLACEMENT;  Surgeon: Georgette Dover, MD;  Location: [facility];  Service: Urology;  Laterality: Left;   CYSTOSCOPY/RETROGRADE/URETEROSCOPY Left 08/09/2014   Procedure: CYSTOSCOPY/RETROGRADE/LEFT STENT;  Surgeon: Bernestine Amass, MD;  Location: [facility];  Service: Urology;  Laterality: Left;   EXTRACORPOREAL SHOCK WAVE LITHOTRIPSY  2013   HOLMIUM LASER APPLICATION Left 27/78/2423   Procedure: HOLMIUM LASER APPLICATION;  Surgeon: Georgette Dover, MD;  Location: [facility];  Service: Urology;  Laterality: Left;   LAPAROTOMY W/  UNILATERAL SALPINGOOPHORECTOMY  age 57   TOTAL ABDOMINAL HYSTERECTOMY  age 53   w/ unilateral salpingoophorectomy   TRANSTHORACIC ECHOCARDIOGRAM  02-13-2012   mild LVH/  ef 65%/  trivial MR  &  PR/  mild RVE   URETEROSCOPY Left 09/15/2014   Procedure: URETEROSCOPY;  Surgeon: Georgette Dover, MD;  Location: [facility];  Service: Urology;  Laterality: Left;    Current Outpatient Medications  Medication Sig Dispense Refill   fluticasone (FLONASE) 50 MCG/ACT nasal spray Place 2 sprays into the nose daily as needed for allergies.      ibuprofen (ADVIL) 200 MG tablet Take 200  mg by mouth. Takes as needed     losartan (COZAAR) 100 MG tablet TAKE 1 TABLET ONCE DAILY. (Patient taking differently: Take 100 mg by mouth daily.) 90 tablet 2   lubiprostone (AMITIZA) 24 MCG capsule Take 1 capsule (24 mcg total) by mouth 2 (two) times daily with a meal. 60 capsule 3   metoCLOPramide (REGLAN) 10 MG tablet Take 1 tablet (10 mg total) by mouth every 6 (six) hours. 30 tablet 0   metoprolol tartrate (LOPRESSOR) 25 MG tablet TAKE (1) TABLET TWICE DAILY. (Patient taking differently: Take 25 mg by mouth 2  (two) times daily.) 180 tablet 1   ondansetron (ZOFRAN) 4 MG tablet Take 1 tablet (4 mg total) by mouth every 8 (eight) hours as needed for nausea or vomiting. 30 tablet 2   oxyCODONE-acetaminophen (PERCOCET) 10-325 MG tablet Take 1 tablet by mouth 4 (four) times daily as needed.     sulfamethoxazole-trimethoprim (BACTRIM DS) 800-160 MG tablet Take 1 tablet by mouth 2 (two) times daily. 14 tablet 0   trimethoprim (TRIMPEX) 100 MG tablet Take 1 tablet (100 mg total) by mouth at bedtime. Start once Bactrim is completed. 30 tablet 5   vitamin E 180 MG (400 UNITS) capsule Take 400 Units by mouth daily.     No current facility-administered medications for this visit.    Allergies as of 07/19/2022   (No Known Allergies)    Family History  Problem Relation Age of Onset   Cancer Other        Lung and bladder   Cancer Other        "Bone marrow cancer"    Social History   Socioeconomic History   Marital status: Married    Spouse name: Not on file   Number of children: 1   Years of education: Not on file   Highest education level: Not on file  Occupational History    Employer: RETIRED  Tobacco Use   Smoking status: Never    Passive exposure: Never   Smokeless tobacco: Never  Vaping Use   Vaping Use: Never used  Substance and Sexual Activity   Alcohol use: No   Drug use: No   Sexual activity: Not on file  Other Topics Concern   Not on file  Social History Narrative   Not on file   Social Determinants of Health   Financial Resource Strain: Not on file  Food Insecurity: Not on file  Transportation Needs: Not on file  Physical Activity: Not on file  Stress: Not on file  Social Connections: Not on file    Review of Systems: Gen: Denies fever, chills, cold or flu like symptoms, pre-syncope, or syncope.  CV: Denies chest pain, palpitations. Resp: Denies dyspnea, cough.  GI: See HPI Heme: See HPI  Physical Exam: There were no vitals taken for this visit. General:    Alert and oriented. No distress noted. Pleasant and cooperative.  Head:  Normocephalic and atraumatic. Eyes:  Conjuctiva clear without scleral icterus. Heart:  S1, S2 present without murmurs appreciated. Lungs:  Clear to auscultation bilaterally. No wheezes, rales, or rhonchi. No distress.  Abdomen:  +BS, soft, non-tender and non-distended. No rebound or guarding. No HSM or masses noted. Msk:  Symmetrical without gross deformities. Normal posture. Extremities:  Without edema. Neurologic:  Alert and  oriented x4 Psych:  Normal mood and affect.    Assessment:     Plan:  ***   Aliene Altes, PA-C [facility] 07/19/2022

## 2022-07-19 ENCOUNTER — Encounter: Payer: Self-pay | Admitting: Gastroenterology

## 2022-07-19 ENCOUNTER — Ambulatory Visit (INDEPENDENT_AMBULATORY_CARE_PROVIDER_SITE_OTHER): Payer: PPO | Admitting: Gastroenterology

## 2022-07-19 VITALS — BP 128/69 | HR 74 | Temp 97.7°F | Ht 66.0 in | Wt 159.8 lb

## 2022-07-19 DIAGNOSIS — K5903 Drug induced constipation: Secondary | ICD-10-CM | POA: Diagnosis not present

## 2022-07-19 DIAGNOSIS — T402X5A Adverse effect of other opioids, initial encounter: Secondary | ICD-10-CM

## 2022-07-19 NOTE — Patient Instructions (Signed)
Continue Amitiza 24 mcg twice daily with a meal.  You may take MiraLAX 1 capful (17 g) as needed for breakthrough constipation.  You can take MiraLAX every day with Amitiza if needed.  We will follow-up with you in 6 months.  Do not hesitate to call sooner if you have any questions or concerns.  It was great to see you again today!  I am glad you are feeling better!  Aliene Altes, PA-C [facility]

## 2022-08-03 DIAGNOSIS — Z1329 Encounter for screening for other suspected endocrine disorder: Secondary | ICD-10-CM | POA: Diagnosis not present

## 2022-08-03 DIAGNOSIS — I10 Essential (primary) hypertension: Secondary | ICD-10-CM | POA: Diagnosis not present

## 2022-08-03 DIAGNOSIS — E7849 Other hyperlipidemia: Secondary | ICD-10-CM | POA: Diagnosis not present

## 2022-08-03 DIAGNOSIS — N183 Chronic kidney disease, stage 3 unspecified: Secondary | ICD-10-CM | POA: Diagnosis not present

## 2022-08-03 DIAGNOSIS — R5383 Other fatigue: Secondary | ICD-10-CM | POA: Diagnosis not present

## 2022-08-04 ENCOUNTER — Ambulatory Visit: Payer: PPO | Admitting: Gastroenterology

## 2022-08-07 ENCOUNTER — Other Ambulatory Visit (HOSPITAL_COMMUNITY): Payer: PPO

## 2022-08-07 DIAGNOSIS — E039 Hypothyroidism, unspecified: Secondary | ICD-10-CM | POA: Diagnosis not present

## 2022-08-07 DIAGNOSIS — M48062 Spinal stenosis, lumbar region with neurogenic claudication: Secondary | ICD-10-CM | POA: Diagnosis not present

## 2022-08-07 DIAGNOSIS — M545 Low back pain, unspecified: Secondary | ICD-10-CM | POA: Diagnosis not present

## 2022-08-07 DIAGNOSIS — Z23 Encounter for immunization: Secondary | ICD-10-CM | POA: Diagnosis not present

## 2022-08-07 DIAGNOSIS — E1129 Type 2 diabetes mellitus with other diabetic kidney complication: Secondary | ICD-10-CM | POA: Diagnosis not present

## 2022-08-07 DIAGNOSIS — R609 Edema, unspecified: Secondary | ICD-10-CM | POA: Diagnosis not present

## 2022-08-07 DIAGNOSIS — R002 Palpitations: Secondary | ICD-10-CM | POA: Diagnosis not present

## 2022-08-07 DIAGNOSIS — I10 Essential (primary) hypertension: Secondary | ICD-10-CM | POA: Diagnosis not present

## 2022-08-07 DIAGNOSIS — M47816 Spondylosis without myelopathy or radiculopathy, lumbar region: Secondary | ICD-10-CM | POA: Diagnosis not present

## 2022-08-07 DIAGNOSIS — R5383 Other fatigue: Secondary | ICD-10-CM | POA: Diagnosis not present

## 2022-08-07 DIAGNOSIS — Z0001 Encounter for general adult medical examination with abnormal findings: Secondary | ICD-10-CM | POA: Diagnosis not present

## 2022-08-07 DIAGNOSIS — R4582 Worries: Secondary | ICD-10-CM | POA: Diagnosis not present

## 2022-08-07 NOTE — Patient Instructions (Addendum)
DUE TO COVID-19 ONLY TWO VISITORS  (aged 85 and older)  ARE ALLOWED TO COME WITH YOU AND STAY IN THE WAITING ROOM ONLY DURING PRE OP AND PROCEDURE.   **NO VISITORS ARE ALLOWED IN THE SHORT STAY AREA OR RECOVERY ROOM!!**  IF YOU WILL BE ADMITTED INTO THE HOSPITAL YOU ARE ALLOWED ONLY FOUR SUPPORT PEOPLE DURING VISITATION HOURS ONLY (7 AM -8PM)   The support person(s) must pass our screening, gel in and out, and wear a mask at all times, including in the patient's room. Patients must also wear a mask when staff or their support person are in the room. Visitors GUEST BADGE MUST BE WORN VISIBLY  One adult visitor may remain with you overnight and MUST be in the room by 8 P.M.     Your procedure is scheduled on: 08/18/22   Report to [facility] Main Entrance    Report to admitting at   7:30 AM   Call this number if you have problems the morning of surgery [phone]   Do not eat food or drink:After Midnight.              If you have questions, please contact your surgeon's office.      Oral Hygiene is also important to reduce your risk of infection.                                    Remember - BRUSH YOUR TEETH THE MORNING OF SURGERY WITH YOUR REGULAR TOOTHPASTE    Take these medicines the morning of surgery with A SIP OF WATER: Metoprolol and amlodipine   Bring CPAP mask and tubing day of surgery.                              You may not have any metal on your body including hair pins, jewelry, and body piercing             Do not wear make-up, lotions, powders, perfumes/cologne, or deodorant  Do not wear nail polish including gel and S&S, artificial/acrylic nails, or any other type of covering on natural nails including finger and toenails. If you have artificial nails, gel coating, etc. that needs to be removed by a nail salon please have this removed prior to surgery or surgery may need to be canceled/ delayed if the surgeon/ anesthesia feels like they are unable to  be safely monitored.   Do not shave  48 hours prior to surgery.     Do not bring valuables to the hospital. [place].   Contacts, dentures or bridgework may not be worn into surgery.   Bring small overnight bag day of surgery.   DO NOT [place]. PHARMACY WILL DISPENSE MEDICATIONS LISTED ON YOUR MEDICATION LIST TO YOU DURING YOUR ADMISSION [place]!       Special Instructions: Bring a copy of your healthcare power of attorney and living will documents  the day of surgery if you haven't scanned them before.              Please read over the following fact sheets you were given: IF YOU HAVE QUESTIONS ABOUT YOUR PRE-OP INSTRUCTIONS PLEASE CALL [place] -  Preparing for Surgery Before surgery, you can play an important role.  Because skin is not sterile, your skin needs to be as free of germs as possible.  You can reduce the number of germs on your skin by washing with CHG (chlorahexidine gluconate) soap before surgery.  CHG is an antiseptic cleaner which kills germs and bonds with the skin to continue killing germs even after washing. Please DO NOT use if you have an allergy to CHG or antibacterial soaps.  If your skin becomes reddened/irritated stop using the CHG and inform your nurse when you arrive at Short Stay. Do not shave (including legs and underarms) for at least 48 hours prior to the first CHG shower.   Please follow these instructions carefully:  1.  Shower with CHG Soap the night before surgery and the  morning of Surgery.  2.  If you choose to wash your hair, wash your hair first as usual with your  normal  shampoo.  3.  After you shampoo, rinse your hair and body thoroughly to remove the  shampoo.                            4.  Use CHG as you would any other liquid soap.  You can apply chg directly  to the skin and wash                       Gently with a scrungie or  clean washcloth.  5.  Apply the CHG Soap to your body ONLY FROM THE NECK DOWN.   Do not use on face/ open                           Wound or open sores. Avoid contact with eyes, ears mouth and genitals (private parts).                       Wash face,  Genitals (private parts) with your normal soap.             6.  Wash thoroughly, paying special attention to the area where your surgery  will be performed.  7.  Thoroughly rinse your body with warm water from the neck down.  8.  DO NOT shower/wash with your normal soap after using and rinsing off  the CHG Soap.                9.  Pat yourself dry with a clean towel.            10.  Wear clean pajamas.            11.  Place clean sheets on your bed the night of your first shower and do not  sleep with pets. Day of Surgery : Do not apply any lotions/deodorants the morning of surgery.  Please wear clean clothes to the hospital/surgery center.  FAILURE TO FOLLOW THESE INSTRUCTIONS MAY RESULT IN THE CANCELLATION OF YOUR SURGERY   ________________________________________________________________________

## 2022-08-11 ENCOUNTER — Encounter (HOSPITAL_COMMUNITY): Payer: Self-pay

## 2022-08-11 ENCOUNTER — Encounter (HOSPITAL_COMMUNITY)
Admission: RE | Admit: 2022-08-11 | Discharge: 2022-08-11 | Disposition: A | Discharge status: Home / Self Care | Payer: PPO | Source: Ambulatory Visit | Attending: Urology | Admitting: Urology

## 2022-08-11 ENCOUNTER — Other Ambulatory Visit: Payer: Self-pay

## 2022-08-11 DIAGNOSIS — Z01818 Encounter for other preprocedural examination: Secondary | ICD-10-CM | POA: Insufficient documentation

## 2022-08-11 DIAGNOSIS — R9431 Abnormal electrocardiogram [ECG] [EKG]: Secondary | ICD-10-CM | POA: Insufficient documentation

## 2022-08-11 DIAGNOSIS — I119 Hypertensive heart disease without heart failure: Secondary | ICD-10-CM | POA: Insufficient documentation

## 2022-08-11 DIAGNOSIS — I10 Essential (primary) hypertension: Secondary | ICD-10-CM

## 2022-08-11 LAB — BASIC METABOLIC PANEL
Anion gap: 8 (ref 5–15)
BUN: 8 mg/dL (ref 8–23)
CO2: 26 mmol/L (ref 22–32)
Calcium: 9.8 mg/dL (ref 8.9–10.3)
Chloride: 102 mmol/L (ref 98–111)
Creatinine, Ser: 0.78 mg/dL (ref 0.44–1.00)
GFR, Estimated: >60 mL/min (ref >60)
Glucose, Bld: 98 mg/dL (ref 70–99)
Potassium: 4.5 mmol/L (ref 3.5–5.1)
Sodium: 136 mmol/L (ref 135–145)

## 2022-08-11 LAB — CBC
HCT: 41.1 % (ref 36.0–46.0)
Hemoglobin: 13.6 g/dL (ref 12.0–15.0)
MCH: 34 pg (ref 26.0–34.0)
MCHC: 33.1 g/dL (ref 30.0–36.0)
MCV: 102.8 fL — ABNORMAL HIGH (ref 80.0–100.0)
Platelets: 306 10*3/uL (ref 150–400)
RBC: 4 MIL/uL (ref 3.87–5.11)
RDW: 12 % (ref 11.5–15.5)
WBC: 5.4 10*3/uL (ref 4.0–10.5)
nRBC: 0 % (ref 0.0–0.2)

## 2022-08-11 NOTE — Progress Notes (Signed)
Anesthesia note:  Bowel prep reminder:  No  PCP - Dr. Jeanelle Malling Cardiologist -Dr. Gaspar Garbe Other-   Chest x-ray - no EKG - 08/11/22-chart Stress Test - no ECHO - 2018 Cardiac Cath - no CABG-no Pacemaker/ICD device last checked:NA  Sleep Study - no CPAP - no  Pt is pre diabetic-no CBG at PAT visit- Fasting Blood Sugar at home- Checks Blood Sugar _____  Blood Thinner:no Blood Thinner Instructions: Aspirin Instructions: Last Dose:  Anesthesia review: Yes  reason:  Patient denies shortness of breath, fever, cough and chest pain at PAT appointment. Pt has a history of Afib.No report of SOB with activities  She had back surgery with hardware and has scoliosis that cause her chronic back pain.  She had bad experiences with surgery in the past. She was aware under general anesthesia as a young women.[name]  She was being positioned for a cysto and the Dr. hurt her back getting her in the stirrups.   Patient verbalized understanding of instructions that were given to them at the PAT appointment. Patient was also instructed that they will need to review over the PAT instructions again at home before surgery.yes Daughter was with her at the PAT visit

## 2022-08-17 ENCOUNTER — Ambulatory Visit: Payer: PPO | Admitting: Urology

## 2022-08-17 ENCOUNTER — Other Ambulatory Visit: Payer: Self-pay | Admitting: Internal Medicine

## 2022-08-17 DIAGNOSIS — N201 Calculus of ureter: Secondary | ICD-10-CM

## 2022-08-17 NOTE — H&P (View-Only) (Signed)
Subjective:    Consult requested by Consuello Masse MD  Tanya Mueller is an 87 yo female who is a former patient of Dr. Junious Silk in [place].  She had follicular cystitis on a biopsy in 7/20 and has a history of stones with multiple prior procedures.  She has a history of OAB and has failed vesicare and myrbetriq.   She last had a CT in in 2/23 and had a 2.3cm laminated stone in the left renal pelvis and smaller right renal stones.  There was a 46m stone in the LLP on a CT in 2020.   She has a chronic UTI that are becoming more frequency and she will generally benefit from a course of antibiotics.  The last oral antibiotic didn't help and needed a injection. She was in the ER on 05/05/22 with constipation.  A culture that day was negative.  She currently complains of malodorous urine over the last 2 weeks with malaise.  She has no dysuria.   She has some frequency and urgency.  She has not had recent hematuria.   She has had multilevel back surgery with fusion and her back pain gets worse with the infections, left > right.  Her UA looks infected.    ROS:  Review of Systems  Cardiovascular:  Positive for leg swelling.  Gastrointestinal:  Positive for constipation.  Genitourinary:  Positive for frequency and urgency.  Musculoskeletal:  Positive for back pain and joint pain.  All other systems reviewed and are negative.   No Known Allergies  Past Medical History:  Diagnosis Date   Allergic rhinitis    Arthritis    Back ,hips   Complication of anesthesia    awareness during general anesthesia.   Dysrhythmia    A-Fib and PSVT   History of atrial fibrillation    episode 2013 converted with cardizem-- happened at time acute illness due to kidney stone --  resolved  no issues since   History of kidney stones    History of sepsis    urosepsis  2013 due to kidney stone   Hypertension    Left nephrolithiasis    Left ureteral calculus    PAC (premature atrial contraction)    Solitary lung nodule     benign right middle lobe per CT    Past Surgical History:  Procedure Laterality Date   BACK SURGERY  2015   rod and screws   CATARACT EXTRACTION W/ INTRAOCULAR LENS  IMPLANT, BILATERAL  10/02/2008   CYSTOSCOPY WITH BIOPSY N/A 04/08/2019   Procedure: CYSTOSCOPY WITH bladder BIOPSY, FULGURATION;  Surgeon: EFestus Aloe MD;  Location: [facility];  Service: Urology;  Laterality: N/A;   CYSTOSCOPY WITH STENT PLACEMENT Left 09/15/2014   Procedure: CYSTOSCOPY WITH STENT PLACEMENT;  Surgeon: MGeorgette Dover MD;  [facility]  Service: Urology;  Laterality: Left;   CYSTOSCOPY/RETROGRADE/URETEROSCOPY Left 08/09/2014   Procedure: CYSTOSCOPY/RETROGRADE/LEFT STENT;  Surgeon: DBernestine Amass MD;  Location: [facility];  Service: Urology;  Laterality: Left;   EXTRACORPOREAL SHOCK WAVE LITHOTRIPSY  10/03/2011   HOLMIUM LASER APPLICATION Left 140/98/1191  Procedure: HOLMIUM LASER APPLICATION;  Surgeon: MGeorgette Dover MD;  [facility]  Service: Urology;  Laterality: Left;   LAPAROTOMY W/  UNILATERAL SALPINGOOPHORECTOMY  age 87  TOTAL ABDOMINAL HYSTERECTOMY  age 87  w/ unilateral salpingoophorectomy   TRANSTHORACIC ECHOCARDIOGRAM  02/13/2012   mild LVH/  ef 65%/  trivial MR  &  PR/  mild RVE  URETEROSCOPY Left 09/15/2014   Procedure: URETEROSCOPY;  Surgeon: Georgette Dover, MD;  Location: [facility];  Service: Urology;  Laterality: Left;    Social History   Socioeconomic History   Marital status: Married    Spouse name: Not on file   Number of children: 1   Years of education: Not on file   Highest education level: Not on file  Occupational History    Employer: RETIRED  Tobacco Use   Smoking status: Never    Passive exposure: Never   Smokeless tobacco: Never  Vaping Use   Vaping Use: Never used  Substance and Sexual Activity   Alcohol use: No   Drug use: No   Sexual activity: Not on file  Other Topics Concern    Not on file  Social History Narrative   Not on file   Social Determinants of Health   Financial Resource Strain: Not on file  Food Insecurity: Not on file  Transportation Needs: Not on file  Physical Activity: Not on file  Stress: Not on file  Social Connections: Not on file  Intimate Partner Violence: Not on file    Family History  Problem Relation Age of Onset   Cancer Other        Lung and bladder   Cancer Other        "Bone marrow cancer"    Anti-infectives: Anti-infectives (From admission, onward)    None       No current facility-administered medications for this encounter.   Current Outpatient Medications  Medication Sig Dispense Refill   acetaminophen (TYLENOL) 325 MG tablet Take 325 mg by mouth every 6 (six) hours as needed for moderate pain. Take with oxycodone     amLODipine (NORVASC) 5 MG tablet Take 5 mg by mouth daily.     fluticasone (FLONASE) 50 MCG/ACT nasal spray Place 2 sprays into the nose daily as needed for allergies.      losartan (COZAAR) 100 MG tablet TAKE 1 TABLET ONCE DAILY. 90 tablet 2   lubiprostone (AMITIZA) 24 MCG capsule Take 1 capsule (24 mcg total) by mouth 2 (two) times daily with a meal. 60 capsule 3   metoprolol tartrate (LOPRESSOR) 25 MG tablet TAKE (1) TABLET TWICE DAILY. 180 tablet 1   ondansetron (ZOFRAN) 4 MG tablet Take 1 tablet (4 mg total) by mouth every 8 (eight) hours as needed for nausea or vomiting. 30 tablet 2   Oxycodone HCl 10 MG TABS Take 10 mg by mouth every 6 (six) hours as needed (pain).     polyethylene glycol (MIRALAX / GLYCOLAX) 17 g packet Take 17 g by mouth daily as needed for moderate constipation.     senna-docusate (SENOKOT-S) 8.6-50 MG tablet Take 1 tablet by mouth daily as needed for mild constipation.     trimethoprim (TRIMPEX) 100 MG tablet Take 1 tablet (100 mg total) by mouth at bedtime. Start once Bactrim is completed. 30 tablet 5   vitamin E 180 MG (400 UNITS) capsule Take 400 Units by mouth daily.        Objective: Vital signs in last 24 hours: There were no vitals taken for this visit.  Intake/Output from previous day: No intake/output data recorded. Intake/Output this shift: '@IOTHISSHIFT'$ @   Physical Exam Vitals reviewed.  Constitutional:      Appearance: Normal appearance.  Cardiovascular:     Rate and Rhythm: Normal rate and regular rhythm.  Pulmonary:     Effort: Pulmonary effort is normal.  Breath sounds: Normal breath sounds.  Abdominal:     General: Abdomen is flat.     Palpations: Abdomen is soft.     Tenderness: There is no abdominal tenderness.  Neurological:     Mental Status: She is alert.     Lab Results:  No results found for this or any previous visit (from the past 24 hour(s)).   BMET No results for input(s): "NA", "K", "CL", "CO2", "GLUCOSE", "BUN", "CREATININE", "CALCIUM" in the last 72 hours. PT/INR No results for input(s): "LABPROT", "INR" in the last 72 hours. ABG No results for input(s): "PHART", "HCO3" in the last 72 hours.  Invalid input(s): "PCO2", "PO2"  Studies/Results: No results found. No results found.  CLINICAL DATA:  Worsening left-sided abdominal pain for 1 week   EXAM: CT ABDOMEN AND PELVIS WITH CONTRAST   TECHNIQUE: Multidetector CT imaging of the abdomen and pelvis was performed using the standard protocol following bolus administration of intravenous contrast.   RADIATION DOSE REDUCTION: This exam was performed according to the departmental dose-optimization program which includes automated exposure control, adjustment of the mA and/or kV according to patient size and/or use of iterative reconstruction technique.   CONTRAST:  120m OMNIPAQUE IOHEXOL 300 MG/ML  SOLN   COMPARISON:  03/04/2019   FINDINGS: Lower chest: No acute pleural or parenchymal lung disease. Stable bibasilar scarring.   Hepatobiliary: No focal liver abnormality is seen. No gallstones, gallbladder wall thickening, or biliary  dilatation.   Pancreas: Unremarkable. No pancreatic ductal dilatation or surrounding inflammatory changes.   Spleen: Normal in size without focal abnormality.   Adrenals/Urinary Tract: There is a large lamellated nonobstructing calculus within the left renal pelvis, measuring 2.3 by 1.5 x 2.0 cm. Multiple other nonobstructing left renal calculi are identified, largest in the lower pole calyx measuring 8 mm. There are multiple nonobstructing right renal calculi, largest measuring 5 mm within the lower pole calyx. No obstructive uropathy within either kidney. Mild mucosal enhancement of the left ureter could reflect underlying infection.   Bladder is moderately distended without gross abnormality. The adrenals are normal.   Stomach/Bowel: No bowel obstruction or ileus. No bowel wall thickening or inflammatory change.   Vascular/Lymphatic: Aortic atherosclerosis. No enlarged abdominal or pelvic lymph nodes.   Reproductive: Status post hysterectomy. No adnexal masses.   Other: No free fluid or free gas.  No abdominal wall hernia.   Musculoskeletal: No acute or destructive bony lesions. Extensive postsurgical changes throughout the lumbar spine again noted. Reconstructed images demonstrate no additional findings.   IMPRESSION: 1. Bilateral nonobstructing renal calculi, left greater than right. 2. Mild enhancement of the left ureteral mucosa, nonspecific. This could reflect underlying infection. Please correlate with urinalysis. 3.  Aortic Atherosclerosis (ICD10-I70.0).     Electronically Signed   By: MRanda NgoM.D.   On: 11/08/2021 17:35  [facility]  Assessment/Plan: 2.3cm left renal pelvic stone with mild obstruction on CT in 2/23.   She needs a left PCNL and I have described the procedure and risks including bleeding, infection, urine leaks, renal and adjacent organ injury, need for secondary procedures, thrombotic events and anesthetic  complications.  Chronic cystitis.   She had e. Coli on her last culture and is now on trimpex for suppression.  No orders of the defined types were placed in this encounter.    Orders Placed This Encounter  Procedures   ABO/Rh    Standing Status:   Standing    Number of Occurrences:  1     No follow-ups on file.    CC: Dr. Consuello Masse.      Irine Seal 08/17/2022

## 2022-08-17 NOTE — H&P (Signed)
Subjective:    Consult requested by Consuello Masse MD  Tanya Mueller is an 87 yo female who is a former patient of Dr. Junious Silk in [place].  She had follicular cystitis on a biopsy in 7/20 and has a history of stones with multiple prior procedures.  She has a history of OAB and has failed vesicare and myrbetriq.   She last had a CT in in 2/23 and had a 2.3cm laminated stone in the left renal pelvis and smaller right renal stones.  There was a 28m stone in the LLP on a CT in 2020.   She has a chronic UTI that are becoming more frequency and she will generally benefit from a course of antibiotics.  The last oral antibiotic didn't help and needed a injection. She was in the ER on 05/05/22 with constipation.  A culture that day was negative.  She currently complains of malodorous urine over the last 2 weeks with malaise.  She has no dysuria.   She has some frequency and urgency.  She has not had recent hematuria.   She has had multilevel back surgery with fusion and her back pain gets worse with the infections, left > right.  Her UA looks infected.    ROS:  Review of Systems  Cardiovascular:  Positive for leg swelling.  Gastrointestinal:  Positive for constipation.  Genitourinary:  Positive for frequency and urgency.  Musculoskeletal:  Positive for back pain and joint pain.  All other systems reviewed and are negative.   No Known Allergies  Past Medical History:  Diagnosis Date   Allergic rhinitis    Arthritis    Back ,hips   Complication of anesthesia    awareness during general anesthesia.   Dysrhythmia    A-Fib and PSVT   History of atrial fibrillation    episode 2013 converted with cardizem-- happened at time acute illness due to kidney stone --  resolved  no issues since   History of kidney stones    History of sepsis    urosepsis  2013 due to kidney stone   Hypertension    Left nephrolithiasis    Left ureteral calculus    PAC (premature atrial contraction)    Solitary lung nodule     benign right middle lobe per CT    Past Surgical History:  Procedure Laterality Date   BACK SURGERY  2015   rod and screws   CATARACT EXTRACTION W/ INTRAOCULAR LENS  IMPLANT, BILATERAL  10/02/2008   CYSTOSCOPY WITH BIOPSY N/A 04/08/2019   Procedure: CYSTOSCOPY WITH bladder BIOPSY, FULGURATION;  Surgeon: EFestus Aloe MD;  Location: [facility];  Service: Urology;  Laterality: N/A;   CYSTOSCOPY WITH STENT PLACEMENT Left 09/15/2014   Procedure: CYSTOSCOPY WITH STENT PLACEMENT;  Surgeon: MGeorgette Dover MD;  [facility]  Service: Urology;  Laterality: Left;   CYSTOSCOPY/RETROGRADE/URETEROSCOPY Left 08/09/2014   Procedure: CYSTOSCOPY/RETROGRADE/LEFT STENT;  Surgeon: DBernestine Amass MD;  Location: [facility];  Service: Urology;  Laterality: Left;   EXTRACORPOREAL SHOCK WAVE LITHOTRIPSY  10/03/2011   HOLMIUM LASER APPLICATION Left 150/27/7412  Procedure: HOLMIUM LASER APPLICATION;  Surgeon: MGeorgette Dover MD;  [facility]  Service: Urology;  Laterality: Left;   LAPAROTOMY W/  UNILATERAL SALPINGOOPHORECTOMY  age [age]  TOTAL ABDOMINAL HYSTERECTOMY  age [age]  w/ unilateral salpingoophorectomy   TRANSTHORACIC ECHOCARDIOGRAM  02/13/2012   mild LVH/  ef 65%/  trivial MR  &  PR/  mild RVE  URETEROSCOPY Left 09/15/2014   Procedure: URETEROSCOPY;  Surgeon: Georgette Dover, MD;  Location[facility];  Service: Urology;  Laterality: Left;    Social History   Socioeconomic History   Marital status: Married    Spouse name: Not on file   Number of children: 1   Years of education: Not on file   Highest education level: Not on file  Occupational History    Employer: RETIRED  Tobacco Use   Smoking status: Never    Passive exposure: Never   Smokeless tobacco: Never  Vaping Use   Vaping Use: Never used  Substance and Sexual Activity   Alcohol use: No   Drug use: No   Sexual activity: Not on file  Other Topics Concern    Not on file  Social History Narrative   Not on file   Social Determinants of Health   Financial Resource Strain: Not on file  Food Insecurity: Not on file  Transportation Needs: Not on file  Physical Activity: Not on file  Stress: Not on file  Social Connections: Not on file  Intimate Partner Violence: Not on file    Family History  Problem Relation Age of Onset   Cancer Other        Lung and bladder   Cancer Other        "Bone marrow cancer"    Anti-infectives: Anti-infectives (From admission, onward)    None       No current facility-administered medications for this encounter.   Current Outpatient Medications  Medication Sig Dispense Refill   acetaminophen (TYLENOL) 325 MG tablet Take 325 mg by mouth every 6 (six) hours as needed for moderate pain. Take with oxycodone     amLODipine (NORVASC) 5 MG tablet Take 5 mg by mouth daily.     fluticasone (FLONASE) 50 MCG/ACT nasal spray Place 2 sprays into the nose daily as needed for allergies.      losartan (COZAAR) 100 MG tablet TAKE 1 TABLET ONCE DAILY. 90 tablet 2   lubiprostone (AMITIZA) 24 MCG capsule Take 1 capsule (24 mcg total) by mouth 2 (two) times daily with a meal. 60 capsule 3   metoprolol tartrate (LOPRESSOR) 25 MG tablet TAKE (1) TABLET TWICE DAILY. 180 tablet 1   ondansetron (ZOFRAN) 4 MG tablet Take 1 tablet (4 mg total) by mouth every 8 (eight) hours as needed for nausea or vomiting. 30 tablet 2   Oxycodone HCl 10 MG TABS Take 10 mg by mouth every 6 (six) hours as needed (pain).     polyethylene glycol (MIRALAX / GLYCOLAX) 17 g packet Take 17 g by mouth daily as needed for moderate constipation.     senna-docusate (SENOKOT-S) 8.6-50 MG tablet Take 1 tablet by mouth daily as needed for mild constipation.     trimethoprim (TRIMPEX) 100 MG tablet Take 1 tablet (100 mg total) by mouth at bedtime. Start once Bactrim is completed. 30 tablet 5   vitamin E 180 MG (400 UNITS) capsule Take 400 Units by mouth daily.        Objective: Vital signs in last 24 hours: There were no vitals taken for this visit.  Intake/Output from previous day: No intake/output data recorded. Intake/Output this shift: '@IOTHISSHIFT'$ @   Physical Exam Vitals reviewed.  Constitutional:      Appearance: Normal appearance.  Cardiovascular:     Rate and Rhythm: Normal rate and regular rhythm.  Pulmonary:     Effort: Pulmonary effort is normal.  Breath sounds: Normal breath sounds.  Abdominal:     General: Abdomen is flat.     Palpations: Abdomen is soft.     Tenderness: There is no abdominal tenderness.  Neurological:     Mental Status: She is alert.     Lab Results:  No results found for this or any previous visit (from the past 24 hour(s)).   BMET No results for input(s): "NA", "K", "CL", "CO2", "GLUCOSE", "BUN", "CREATININE", "CALCIUM" in the last 72 hours. PT/INR No results for input(s): "LABPROT", "INR" in the last 72 hours. ABG No results for input(s): "PHART", "HCO3" in the last 72 hours.  Invalid input(s): "PCO2", "PO2"  Studies/Results: No results found. No results found.  CLINICAL DATA:  Worsening left-sided abdominal pain for 1 week   EXAM: CT ABDOMEN AND PELVIS WITH CONTRAST   TECHNIQUE: Multidetector CT imaging of the abdomen and pelvis was performed using the standard protocol following bolus administration of intravenous contrast.   RADIATION DOSE REDUCTION: This exam was performed according to the departmental dose-optimization program which includes automated exposure control, adjustment of the mA and/or kV according to patient size and/or use of iterative reconstruction technique.   CONTRAST:  152m OMNIPAQUE IOHEXOL 300 MG/ML  SOLN   COMPARISON:  03/04/2019   FINDINGS: Lower chest: No acute pleural or parenchymal lung disease. Stable bibasilar scarring.   Hepatobiliary: No focal liver abnormality is seen. No gallstones, gallbladder wall thickening, or biliary  dilatation.   Pancreas: Unremarkable. No pancreatic ductal dilatation or surrounding inflammatory changes.   Spleen: Normal in size without focal abnormality.   Adrenals/Urinary Tract: There is a large lamellated nonobstructing calculus within the left renal pelvis, measuring 2.3 by 1.5 x 2.0 cm. Multiple other nonobstructing left renal calculi are identified, largest in the lower pole calyx measuring 8 mm. There are multiple nonobstructing right renal calculi, largest measuring 5 mm within the lower pole calyx. No obstructive uropathy within either kidney. Mild mucosal enhancement of the left ureter could reflect underlying infection.   Bladder is moderately distended without gross abnormality. The adrenals are normal.   Stomach/Bowel: No bowel obstruction or ileus. No bowel wall thickening or inflammatory change.   Vascular/Lymphatic: Aortic atherosclerosis. No enlarged abdominal or pelvic lymph nodes.   Reproductive: Status post hysterectomy. No adnexal masses.   Other: No free fluid or free gas.  No abdominal wall hernia.   Musculoskeletal: No acute or destructive bony lesions. Extensive postsurgical changes throughout the lumbar spine again noted. Reconstructed images demonstrate no additional findings.   IMPRESSION: 1. Bilateral nonobstructing renal calculi, left greater than right. 2. Mild enhancement of the left ureteral mucosa, nonspecific. This could reflect underlying infection. Please correlate with urinalysis. 3.  Aortic Atherosclerosis (ICD10-I70.0).     Electronically Signed   By: MRanda NgoM.D.   On: 11/08/2021 17:35  [place] [facility]  Assessment/Plan: 2.3cm left renal pelvic stone with mild obstruction on CT in 2/23.   She needs a left PCNL and I have described the procedure and risks including bleeding, infection, urine leaks, renal and adjacent organ injury, need for secondary procedures, thrombotic events and anesthetic  complications.  Chronic cystitis.   She had e. Coli on her last culture and is now on trimpex for suppression.  No orders of the defined types were placed in this encounter.    Orders Placed This Encounter  Procedures   ABO/Rh    Standing Status:   Standing    Number of Occurrences:  1     No follow-ups on file.    CC: Dr. Consuello Masse.      Irine Seal 08/17/2022

## 2022-08-18 ENCOUNTER — Ambulatory Visit (HOSPITAL_COMMUNITY): Payer: PPO | Admitting: Physician Assistant

## 2022-08-18 ENCOUNTER — Other Ambulatory Visit: Payer: Self-pay

## 2022-08-18 ENCOUNTER — Ambulatory Visit (HOSPITAL_COMMUNITY)
Admission: RE | Admit: 2022-08-18 | Discharge: 2022-08-18 | Disposition: A | Discharge status: Home / Self Care | Payer: PPO | Source: Ambulatory Visit | Attending: Urology | Admitting: Urology

## 2022-08-18 ENCOUNTER — Encounter (HOSPITAL_COMMUNITY): Payer: Self-pay

## 2022-08-18 ENCOUNTER — Encounter (HOSPITAL_COMMUNITY)
Admission: RE | Disposition: A | Discharge status: Home / Self Care | Payer: Self-pay | Source: Ambulatory Visit | Attending: Urology

## 2022-08-18 ENCOUNTER — Observation Stay (HOSPITAL_COMMUNITY)
Admission: RE | Admit: 2022-08-18 | Discharge: 2022-08-19 | Disposition: A | Discharge status: Home / Self Care | Payer: PPO | Source: Ambulatory Visit | Attending: Urology | Admitting: Urology

## 2022-08-18 ENCOUNTER — Ambulatory Visit (HOSPITAL_COMMUNITY)
Admission: RE | Admit: 2022-08-18 | Discharge: 2022-08-18 | Disposition: A | Discharge status: Home / Self Care | Payer: PPO | Source: Ambulatory Visit

## 2022-08-18 ENCOUNTER — Ambulatory Visit (HOSPITAL_BASED_OUTPATIENT_CLINIC_OR_DEPARTMENT_OTHER): Payer: PPO | Admitting: Certified Registered"

## 2022-08-18 ENCOUNTER — Observation Stay (HOSPITAL_COMMUNITY): Payer: PPO

## 2022-08-18 ENCOUNTER — Encounter (HOSPITAL_COMMUNITY): Payer: Self-pay | Admitting: Urology

## 2022-08-18 DIAGNOSIS — I10 Essential (primary) hypertension: Secondary | ICD-10-CM | POA: Diagnosis not present

## 2022-08-18 DIAGNOSIS — I4891 Unspecified atrial fibrillation: Secondary | ICD-10-CM

## 2022-08-18 DIAGNOSIS — Z79899 Other long term (current) drug therapy: Secondary | ICD-10-CM | POA: Insufficient documentation

## 2022-08-18 DIAGNOSIS — N390 Urinary tract infection, site not specified: Secondary | ICD-10-CM | POA: Diagnosis not present

## 2022-08-18 DIAGNOSIS — I471 Supraventricular tachycardia, unspecified: Secondary | ICD-10-CM

## 2022-08-18 DIAGNOSIS — N200 Calculus of kidney: Secondary | ICD-10-CM

## 2022-08-18 DIAGNOSIS — N201 Calculus of ureter: Secondary | ICD-10-CM

## 2022-08-18 DIAGNOSIS — Z960 Presence of urogenital implants: Secondary | ICD-10-CM | POA: Insufficient documentation

## 2022-08-18 HISTORY — PX: IR URETERAL STENT LEFT NEW ACCESS W/O SEP NEPHROSTOMY CATH: IMG6075

## 2022-08-18 HISTORY — PX: NEPHROLITHOTOMY: SHX5134

## 2022-08-18 LAB — CBC WITH DIFFERENTIAL/PLATELET
Abs Immature Granulocytes: 0.02 K/uL (ref 0.00–0.07)
Basophils Absolute: 0.1 K/uL (ref 0.0–0.1)
Basophils Relative: 1 %
Eosinophils Absolute: 0.5 K/uL (ref 0.0–0.5)
Eosinophils Relative: 7 %
HCT: 41.4 % (ref 36.0–46.0)
Hemoglobin: 13.8 g/dL (ref 12.0–15.0)
Immature Granulocytes: 0 %
Lymphocytes Relative: 28 %
Lymphs Abs: 2.1 K/uL (ref 0.7–4.0)
MCH: 34 pg (ref 26.0–34.0)
MCHC: 33.3 g/dL (ref 30.0–36.0)
MCV: 102 fL — ABNORMAL HIGH (ref 80.0–100.0)
Monocytes Absolute: 0.8 K/uL (ref 0.1–1.0)
Monocytes Relative: 11 %
Neutro Abs: 3.8 K/uL (ref 1.7–7.7)
Neutrophils Relative %: 53 %
Platelets: 355 K/uL (ref 150–400)
RBC: 4.06 MIL/uL (ref 3.87–5.11)
RDW: 12.2 % (ref 11.5–15.5)
WBC: 7.4 K/uL (ref 4.0–10.5)
nRBC: 0 % (ref 0.0–0.2)

## 2022-08-18 LAB — PROTIME-INR
INR: 1.1 (ref 0.8–1.2)
Prothrombin Time: 14.3 s (ref 11.4–15.2)

## 2022-08-18 LAB — BASIC METABOLIC PANEL WITH GFR
Anion gap: 6 (ref 5–15)
BUN: 11 mg/dL (ref 8–23)
CO2: 29 mmol/L (ref 22–32)
Calcium: 9.7 mg/dL (ref 8.9–10.3)
Chloride: 98 mmol/L (ref 98–111)
Creatinine, Ser: 0.91 mg/dL (ref 0.44–1.00)
GFR, Estimated: >60 mL/min
Glucose, Bld: 101 mg/dL — ABNORMAL HIGH (ref 70–99)
Potassium: 4.3 mmol/L (ref 3.5–5.1)
Sodium: 133 mmol/L — ABNORMAL LOW (ref 135–145)

## 2022-08-18 LAB — ABO/RH: ABO/RH(D): A NEG

## 2022-08-18 LAB — TYPE AND SCREEN
ABO/RH(D): A NEG
Antibody Screen: NEGATIVE

## 2022-08-18 LAB — HEMOGLOBIN AND HEMATOCRIT, BLOOD
HCT: 41.4 % (ref 36.0–46.0)
Hemoglobin: 13.8 g/dL (ref 12.0–15.0)

## 2022-08-18 SURGERY — NEPHROLITHOTOMY PERCUTANEOUS
Anesthesia: General | Site: Flank | Laterality: Left

## 2022-08-18 MED ORDER — SENNOSIDES-DOCUSATE SODIUM 8.6-50 MG PO TABS: 1.0000 | ORAL_TABLET | Freq: Every evening | ORAL | Status: DC | PRN

## 2022-08-18 MED ORDER — LACTATED RINGERS IV SOLN: INTRAVENOUS | Status: DC

## 2022-08-18 MED ORDER — MIDAZOLAM HCL 2 MG/2ML IJ SOLN
INTRAMUSCULAR | Status: AC | PRN
  Administered 2022-08-18: .5 mg via INTRAVENOUS
  Administered 2022-08-18: 1 mg via INTRAVENOUS

## 2022-08-18 MED ORDER — SODIUM CHLORIDE 0.9 % IV SOLN: 2.0000 g | INTRAVENOUS | Status: DC

## 2022-08-18 MED ORDER — HYDRALAZINE HCL 20 MG/ML IJ SOLN
INTRAMUSCULAR | Status: AC
  Filled 2022-08-18: qty 1

## 2022-08-18 MED ORDER — FLUTICASONE PROPIONATE 50 MCG/ACT NA SUSP: 2.0000 | Freq: Every day | NASAL | Status: DC | PRN

## 2022-08-18 MED ORDER — LIDOCAINE 2% (20 MG/ML) 5 ML SYRINGE
INTRAMUSCULAR | Status: DC | PRN
  Administered 2022-08-18: 60 mg via INTRAVENOUS

## 2022-08-18 MED ORDER — POTASSIUM CHLORIDE IN NACL 20-0.45 MEQ/L-% IV SOLN
INTRAVENOUS | Status: DC
  Filled 2022-08-18 (×3): qty 1000

## 2022-08-18 MED ORDER — HYDRALAZINE HCL 20 MG/ML IJ SOLN
5.0000 mg | Freq: Once | INTRAMUSCULAR | Status: AC
  Administered 2022-08-18: 5 mg via INTRAVENOUS

## 2022-08-18 MED ORDER — FLEET ENEMA 7-19 GM/118ML RE ENEM: 1.0000 | ENEMA | Freq: Once | RECTAL | Status: DC | PRN

## 2022-08-18 MED ORDER — ACETAMINOPHEN 325 MG PO TABS: 650.0000 mg | ORAL_TABLET | ORAL | Status: DC | PRN

## 2022-08-18 MED ORDER — FENTANYL CITRATE PF 50 MCG/ML IJ SOSY
PREFILLED_SYRINGE | INTRAMUSCULAR | Status: AC
  Administered 2022-08-18: 50 ug via INTRAVENOUS
  Filled 2022-08-18: qty 1

## 2022-08-18 MED ORDER — IOHEXOL 300 MG/ML  SOLN
50.0000 mL | Freq: Once | INTRAMUSCULAR | Status: AC | PRN
  Administered 2022-08-18: 10 mL

## 2022-08-18 MED ORDER — LUBIPROSTONE 24 MCG PO CAPS
24.0000 ug | ORAL_CAPSULE | Freq: Two times a day (BID) | ORAL | Status: DC
  Administered 2022-08-18 – 2022-08-19 (×2): 24 ug via ORAL
  Filled 2022-08-18 (×3): qty 1

## 2022-08-18 MED ORDER — PHENYLEPHRINE HCL (PRESSORS) 10 MG/ML IV SOLN
INTRAVENOUS | Status: AC
  Filled 2022-08-18: qty 1

## 2022-08-18 MED ORDER — FENTANYL CITRATE PF 50 MCG/ML IJ SOSY
PREFILLED_SYRINGE | INTRAMUSCULAR | Status: AC
  Filled 2022-08-18: qty 1

## 2022-08-18 MED ORDER — 0.9 % SODIUM CHLORIDE (POUR BTL) OPTIME
TOPICAL | Status: DC | PRN
  Administered 2022-08-18: 1000 mL

## 2022-08-18 MED ORDER — ROCURONIUM BROMIDE 10 MG/ML (PF) SYRINGE
PREFILLED_SYRINGE | INTRAVENOUS | Status: AC
  Filled 2022-08-18: qty 10

## 2022-08-18 MED ORDER — ORAL CARE MOUTH RINSE: 15.0000 mL | Freq: Once | OROMUCOSAL | Status: AC

## 2022-08-18 MED ORDER — FENTANYL CITRATE (PF) 100 MCG/2ML IJ SOLN
INTRAMUSCULAR | Status: AC
  Filled 2022-08-18: qty 2

## 2022-08-18 MED ORDER — ACETAMINOPHEN 10 MG/ML IV SOLN
INTRAVENOUS | Status: AC
  Filled 2022-08-18: qty 100

## 2022-08-18 MED ORDER — SODIUM CHLORIDE 0.9 % IV SOLN
2.0000 g | INTRAVENOUS | Status: AC
  Administered 2022-08-18: 2 g via INTRAVENOUS
  Filled 2022-08-18: qty 20

## 2022-08-18 MED ORDER — FENTANYL CITRATE PF 50 MCG/ML IJ SOSY
25.0000 ug | PREFILLED_SYRINGE | INTRAMUSCULAR | Status: DC | PRN
  Administered 2022-08-18 (×2): 50 ug via INTRAVENOUS

## 2022-08-18 MED ORDER — LIDOCAINE HCL (PF) 1 % IJ SOLN
INTRAMUSCULAR | Status: AC | PRN
  Administered 2022-08-18: 10 mL

## 2022-08-18 MED ORDER — STERILE WATER FOR IRRIGATION IR SOLN
Status: DC | PRN
  Administered 2022-08-18: 1000 mL

## 2022-08-18 MED ORDER — PHENYLEPHRINE HCL-NACL 20-0.9 MG/250ML-% IV SOLN
INTRAVENOUS | Status: DC | PRN
  Administered 2022-08-18: 40 ug/min via INTRAVENOUS

## 2022-08-18 MED ORDER — SODIUM CHLORIDE 0.9 % IV SOLN
1.0000 g | INTRAVENOUS | Status: DC
  Administered 2022-08-19: 1 g via INTRAVENOUS
  Filled 2022-08-18: qty 10

## 2022-08-18 MED ORDER — LOSARTAN POTASSIUM 50 MG PO TABS
100.0000 mg | ORAL_TABLET | Freq: Every day | ORAL | Status: DC
  Administered 2022-08-19: 100 mg via ORAL
  Filled 2022-08-18: qty 2

## 2022-08-18 MED ORDER — IOHEXOL 300 MG/ML  SOLN
INTRAMUSCULAR | Status: DC | PRN
  Administered 2022-08-18: 7 mL

## 2022-08-18 MED ORDER — OXYCODONE HCL 5 MG PO TABS
10.0000 mg | ORAL_TABLET | Freq: Four times a day (QID) | ORAL | Status: DC | PRN
  Administered 2022-08-18 – 2022-08-19 (×2): 10 mg via ORAL
  Filled 2022-08-18 (×2): qty 2

## 2022-08-18 MED ORDER — CHLORHEXIDINE GLUCONATE 0.12% ORAL RINSE (MEDLINE KIT)
15.0000 mL | Freq: Once | OROMUCOSAL | Status: AC
  Administered 2022-08-18: 15 mL via OROMUCOSAL

## 2022-08-18 MED ORDER — ONDANSETRON HCL 4 MG/2ML IJ SOLN
INTRAMUSCULAR | Status: DC | PRN
  Administered 2022-08-18: 4 mg via INTRAVENOUS

## 2022-08-18 MED ORDER — ROCURONIUM BROMIDE 10 MG/ML (PF) SYRINGE
PREFILLED_SYRINGE | INTRAVENOUS | Status: DC | PRN
  Administered 2022-08-18: 50 mg via INTRAVENOUS

## 2022-08-18 MED ORDER — ONDANSETRON HCL 4 MG/2ML IJ SOLN: 4.0000 mg | Freq: Once | INTRAMUSCULAR | Status: DC | PRN

## 2022-08-18 MED ORDER — SODIUM CHLORIDE 0.9 % IV SOLN: 1.0000 g | INTRAVENOUS | Status: DC

## 2022-08-18 MED ORDER — LIDOCAINE-EPINEPHRINE 1 %-1:100000 IJ SOLN
INTRAMUSCULAR | Status: AC
  Administered 2022-08-18: 10 mL via INTRADERMAL
  Filled 2022-08-18: qty 1

## 2022-08-18 MED ORDER — PHENYLEPHRINE 80 MCG/ML (10ML) SYRINGE FOR IV PUSH (FOR BLOOD PRESSURE SUPPORT)
PREFILLED_SYRINGE | INTRAVENOUS | Status: DC | PRN
  Administered 2022-08-18: 160 ug via INTRAVENOUS

## 2022-08-18 MED ORDER — SUGAMMADEX SODIUM 200 MG/2ML IV SOLN
INTRAVENOUS | Status: DC | PRN
  Administered 2022-08-18: 200 mg via INTRAVENOUS

## 2022-08-18 MED ORDER — SODIUM CHLORIDE 0.9 % IV SOLN: INTRAVENOUS | Status: DC

## 2022-08-18 MED ORDER — FENTANYL CITRATE PF 50 MCG/ML IJ SOSY
PREFILLED_SYRINGE | INTRAMUSCULAR | Status: AC
  Administered 2022-08-18: 50 ug
  Filled 2022-08-18: qty 1

## 2022-08-18 MED ORDER — PROPOFOL 1000 MG/100ML IV EMUL
INTRAVENOUS | Status: AC
  Filled 2022-08-18: qty 100

## 2022-08-18 MED ORDER — HYDROMORPHONE HCL 1 MG/ML IJ SOLN
0.5000 mg | INTRAMUSCULAR | Status: DC | PRN
  Administered 2022-08-18 – 2022-08-19 (×5): 1 mg via INTRAVENOUS
  Filled 2022-08-18 (×6): qty 1

## 2022-08-18 MED ORDER — ONDANSETRON HCL 4 MG/2ML IJ SOLN
INTRAMUSCULAR | Status: AC
  Filled 2022-08-18: qty 2

## 2022-08-18 MED ORDER — SODIUM CHLORIDE 0.9 % IR SOLN
Status: DC | PRN
  Administered 2022-08-18: 12000 mL

## 2022-08-18 MED ORDER — DEXAMETHASONE SODIUM PHOSPHATE 10 MG/ML IJ SOLN
INTRAMUSCULAR | Status: AC
  Filled 2022-08-18: qty 1

## 2022-08-18 MED ORDER — GENTAMICIN SULFATE 40 MG/ML IJ SOLN
5.0000 mg/kg | INTRAVENOUS | Status: AC
  Administered 2022-08-18: 390 mg via INTRAVENOUS
  Filled 2022-08-18: qty 9.75

## 2022-08-18 MED ORDER — ONDANSETRON HCL 4 MG/2ML IJ SOLN: 4.0000 mg | INTRAMUSCULAR | Status: DC | PRN

## 2022-08-18 MED ORDER — METOPROLOL TARTRATE 25 MG PO TABS
25.0000 mg | ORAL_TABLET | Freq: Two times a day (BID) | ORAL | Status: DC
  Administered 2022-08-18 – 2022-08-19 (×2): 25 mg via ORAL
  Filled 2022-08-18 (×2): qty 1

## 2022-08-18 MED ORDER — PROPOFOL 10 MG/ML IV BOLUS
INTRAVENOUS | Status: DC | PRN
  Administered 2022-08-18: 80 mg via INTRAVENOUS
  Administered 2022-08-18: 120 ug/kg/min via INTRAVENOUS

## 2022-08-18 MED ORDER — CEFAZOLIN SODIUM-DEXTROSE 1-4 GM/50ML-% IV SOLN: 1.0000 g | Freq: Three times a day (TID) | INTRAVENOUS | Status: DC

## 2022-08-18 MED ORDER — AMLODIPINE BESYLATE 5 MG PO TABS
5.0000 mg | ORAL_TABLET | Freq: Every day | ORAL | Status: DC
  Administered 2022-08-19: 5 mg via ORAL
  Filled 2022-08-18: qty 1

## 2022-08-18 MED ORDER — BISACODYL 10 MG RE SUPP: 10.0000 mg | Freq: Every day | RECTAL | Status: DC | PRN

## 2022-08-18 MED ORDER — MIDAZOLAM HCL 2 MG/2ML IJ SOLN
INTRAMUSCULAR | Status: AC
  Filled 2022-08-18: qty 2

## 2022-08-18 MED ORDER — FENTANYL CITRATE (PF) 100 MCG/2ML IJ SOLN
INTRAMUSCULAR | Status: DC | PRN
  Administered 2022-08-18 (×2): 25 ug via INTRAVENOUS
  Administered 2022-08-18: 50 ug via INTRAVENOUS

## 2022-08-18 MED ORDER — DEXAMETHASONE SODIUM PHOSPHATE 10 MG/ML IJ SOLN
INTRAMUSCULAR | Status: DC | PRN
  Administered 2022-08-18: 5 mg via INTRAVENOUS

## 2022-08-18 MED ORDER — PROPOFOL 10 MG/ML IV BOLUS
INTRAVENOUS | Status: AC
  Filled 2022-08-18: qty 20

## 2022-08-18 MED ORDER — FENTANYL CITRATE (PF) 100 MCG/2ML IJ SOLN
INTRAMUSCULAR | Status: AC | PRN
  Administered 2022-08-18: 50 ug via INTRAVENOUS
  Administered 2022-08-18: 25 ug via INTRAVENOUS

## 2022-08-18 SURGICAL SUPPLY — 57 items
APL PRP STRL LF DISP 70% ISPRP (MISCELLANEOUS) ×1
APL SKNCLS STERI-STRIP NONHPOA (GAUZE/BANDAGES/DRESSINGS) ×1
BAG COUNTER SPONGE SURGICOUNT (BAG) IMPLANT
BAG DRN RND TRDRP ANRFLXCHMBR (UROLOGICAL SUPPLIES) ×1
BAG SPNG CNTER NS LX DISP (BAG)
BAG URINE DRAIN 2000ML AR STRL (UROLOGICAL SUPPLIES) ×1 IMPLANT
BASKET ZERO TIP NITINOL 2.4FR (BASKET) IMPLANT
BENZOIN TINCTURE PRP APPL 2/3 (GAUZE/BANDAGES/DRESSINGS) ×1 IMPLANT
BLADE SURG 15 STRL LF DISP TIS (BLADE) ×1 IMPLANT
BLADE SURG 15 STRL SS (BLADE) ×1
BSKT STON RTRVL ZERO TP 2.4FR (BASKET)
CATH 2WAY 30CC 24FR (CATHETERS) IMPLANT
CATH COUNCIL 22FR (CATHETERS) IMPLANT
CATH ROBINSON RED A/P 20FR (CATHETERS) IMPLANT
CATH URETERAL DUAL LUMEN 10F (MISCELLANEOUS) ×1 IMPLANT
CATH URETL OPEN 5X70 (CATHETERS) IMPLANT
CATH UROLOGY TORQUE 40 (MISCELLANEOUS) ×1 IMPLANT
CATH X-FORCE N30 NEPHROSTOMY (TUBING) ×1 IMPLANT
CHLORAPREP W/TINT 26 (MISCELLANEOUS) ×1 IMPLANT
COVER BACK TABLE 60X90IN (DRAPES) ×1 IMPLANT
COVER SURGICAL LIGHT HANDLE (MISCELLANEOUS) IMPLANT
DRAPE C-ARM 42X120 X-RAY (DRAPES) ×1 IMPLANT
DRAPE LINGEMAN PERC (DRAPES) ×1 IMPLANT
DRAPE SURG IRRIG POUCH 19X23 (DRAPES) ×1 IMPLANT
DRSG TEGADERM 8X12 (GAUZE/BANDAGES/DRESSINGS) ×2 IMPLANT
EXTRACTOR STONE NITINOL NGAGE (UROLOGICAL SUPPLIES) IMPLANT
GAUZE PAD ABD 8X10 STRL (GAUZE/BANDAGES/DRESSINGS) ×2 IMPLANT
GAUZE SPONGE 4X4 12PLY STRL (GAUZE/BANDAGES/DRESSINGS) IMPLANT
GLOVE SURG SS PI 8.0 STRL IVOR (GLOVE) ×1 IMPLANT
GOWN STRL REUS W/ TWL XL LVL3 (GOWN DISPOSABLE) ×1 IMPLANT
GOWN STRL REUS W/TWL XL LVL3 (GOWN DISPOSABLE) ×1
GUIDEWIRE AMPLAZ .035X145 (WIRE) ×2 IMPLANT
GUIDEWIRE STR DUAL SENSOR (WIRE) IMPLANT
KIT BASIN OR (CUSTOM PROCEDURE TRAY) ×1 IMPLANT
KIT PROBE 340X3.4XDISP GRN (MISCELLANEOUS) IMPLANT
KIT PROBE TRILOGY 3.4X340 (MISCELLANEOUS)
KIT PROBE TRILOGY 3.9X350 (MISCELLANEOUS) IMPLANT
KIT TURNOVER KIT A (KITS) IMPLANT
LASER FIB FLEXIVA PULSE ID 365 (Laser) IMPLANT
LUBRICANT JELLY K Y 4OZ (MISCELLANEOUS) ×1 IMPLANT
MANIFOLD NEPTUNE II (INSTRUMENTS) ×1 IMPLANT
NS IRRIG 1000ML POUR BTL (IV SOLUTION) ×1 IMPLANT
PACK CYSTO (CUSTOM PROCEDURE TRAY) IMPLANT
SHEATH PEELAWAY SET 9 (SHEATH) IMPLANT
SPONGE T-LAP 4X18 ~~LOC~~+RFID (SPONGE) ×1 IMPLANT
STENT URET 6FRX24 CONTOUR (STENTS) IMPLANT
SUT SILK 2 0 30  PSL (SUTURE) ×1
SUT SILK 2 0 30 PSL (SUTURE) ×1 IMPLANT
SYR 10ML LL (SYRINGE) ×1 IMPLANT
SYR 20ML LL LF (SYRINGE) ×2 IMPLANT
TOWEL OR 17X26 10 PK STRL BLUE (TOWEL DISPOSABLE) ×1 IMPLANT
TRACTIP FLEXIVA PULS ID 200XHI (Laser) IMPLANT
TRACTIP FLEXIVA PULSE ID 200 (Laser)
TRAY FOLEY MTR SLVR 16FR STAT (SET/KITS/TRAYS/PACK) ×1 IMPLANT
TUBING CONNECTING 10 (TUBING) ×1 IMPLANT
TUBING STONE CATCHER TRILOGY (MISCELLANEOUS) IMPLANT
TUBING UROLOGY SET (TUBING) ×1 IMPLANT

## 2022-08-18 NOTE — Progress Notes (Signed)
0730 NS was hung for the IR procedure.

## 2022-08-18 NOTE — Procedures (Signed)
Interventional Radiology Procedure Note  Procedure: [place] AND FLUORO LEFT PCN ACCESS    Complications: None  Estimated Blood Loss:  0  Findings: 5 FR CATH INTO THE BLADDER    Tamera Punt, MD

## 2022-08-18 NOTE — Consult Note (Signed)
Chief Complaint: Patient was seen in consultation today for left percutaneous nephrostomy/nephroureteral catheter placement  Referring Physician(s): Keene Breath  Supervising Physician: Daryll Brod  Patient Status: [facility] - Out-[facility]  History of Present Illness: Tanya Mueller is an 87 y.o. female with past medical history of history of arthritis, paroxysmal atrial fibrillation, hypertension, overactive bladder and nephrolithiasis. She last had a CT  in 2/23 and had a 2.3cm laminated stone in the left renal pelvis and smaller right renal stones. There was a 54m stone in the LLP on a CT in 2020. She has a chronic UTI that are becoming more frequency and she will generally benefit from a course of antibiotics.  The last oral antibiotic didn't help and needed a injection. She was in the ER on 05/05/22 with constipation.  A culture that day was negative.  She currently complains of malodorous urine over the last 2 weeks with malaise.  She has no dysuria.   She has some frequency and urgency.  She has not had recent hematuria.   She has had multilevel back surgery with fusion and her back pain gets worse with the infections, left > right.  Her UA looks infected.  She presents today for left percutaneous nephrostomy/nephroureteral catheter placement prior to nephrolithotomy.  Past Medical History:  Diagnosis Date   Allergic rhinitis    Arthritis    Back ,hips   Complication of anesthesia    awareness during general anesthesia.   Dysrhythmia    A-Fib and PSVT   History of atrial fibrillation    episode 2013 converted with cardizem-- happened at time acute illness due to kidney stone --  resolved  no issues since   History of kidney stones    History of sepsis    urosepsis  2013 due to kidney stone   Hypertension    Left nephrolithiasis    Left ureteral calculus    PAC (premature atrial contraction)    Solitary lung nodule    benign right middle lobe per CT    Past Surgical History:   Procedure Laterality Date   BACK SURGERY  2015   rod and screws   CATARACT EXTRACTION W/ INTRAOCULAR LENS  IMPLANT, BILATERAL  10/02/2008   CYSTOSCOPY WITH BIOPSY N/A 04/08/2019   Procedure: CYSTOSCOPY WITH bladder BIOPSY, FULGURATION;  Surgeon: EFestus Aloe MD;  [facility];  Service: Urology;  Laterality: N/A;   CYSTOSCOPY WITH STENT PLACEMENT Left 09/15/2014   Procedure: CYSTOSCOPY WITH STENT PLACEMENT;  Surgeon: MGeorgette Dover MD;  [facility]  Service: Urology;  Laterality: Left;   CYSTOSCOPY/RETROGRADE/URETEROSCOPY Left 08/09/2014   Procedure: CYSTOSCOPY/RETROGRADE/LEFT STENT;  Surgeon: DBernestine Amass MD;  [facility];  Service: Urology;  Laterality: Left;   EXTRACORPOREAL SHOCK WAVE LITHOTRIPSY  10/03/2011   HOLMIUM LASER APPLICATION Left 175/07/2584  Procedure: HOLMIUM LASER APPLICATION;  Surgeon: MGeorgette Dover MD;  [facility]  Service: Urology;  Laterality: Left;   LAPAROTOMY W/  UNILATERAL SALPINGOOPHORECTOMY  age 87  TOTAL ABDOMINAL HYSTERECTOMY  age 87  w/ unilateral salpingoophorectomy   TRANSTHORACIC ECHOCARDIOGRAM  02/13/2012   mild LVH/  ef 65%/  trivial MR  &  PR/  mild RVE   URETEROSCOPY Left 09/15/2014   Procedure: URETEROSCOPY;  Surgeon: MGeorgette Dover MD;  [facility]  Service: Urology;  Laterality: Left;    Allergies: Patient has no known allergies.  Medications: Prior to Admission medications   Medication Sig  Start Date End Date Taking? Authorizing Provider  acetaminophen (TYLENOL) 325 MG tablet Take 325 mg by mouth every 6 (six) hours as needed for moderate pain. Take with oxycodone   Yes [provider]  amLODipine (NORVASC) 5 MG tablet Take 5 mg by mouth daily. 07/21/22  Yes [provider]  fluticasone (FLONASE) 50 MCG/ACT nasal spray Place 2 sprays into the nose daily as needed for allergies.    Yes [provider]   losartan (COZAAR) 100 MG tablet TAKE 1 TABLET ONCE DAILY. 06/18/18  Yes Herminio Commons, MD  lubiprostone (AMITIZA) 24 MCG capsule Take 1 capsule (24 mcg total) by mouth 2 (two) times daily with a meal. 05/04/22  Yes Aliene Altes S, PA-C  metoprolol tartrate (LOPRESSOR) 25 MG tablet TAKE (1) TABLET TWICE DAILY. 04/24/19  Yes Herminio Commons, MD  ondansetron (ZOFRAN) 4 MG tablet Take 1 tablet (4 mg total) by mouth every 8 (eight) hours as needed for nausea or vomiting. 04/19/22  Yes Eloise Harman, DO  Oxycodone HCl 10 MG TABS Take 10 mg by mouth every 6 (six) hours as needed (pain).   Yes [provider]  polyethylene glycol (MIRALAX / GLYCOLAX) 17 g packet Take 17 g by mouth daily as needed for moderate constipation.   Yes [provider]  senna-docusate (SENOKOT-S) 8.6-50 MG tablet Take 1 tablet by mouth daily as needed for mild constipation.   Yes [provider]  trimethoprim (TRIMPEX) 100 MG tablet Take 1 tablet (100 mg total) by mouth at bedtime. Start once Bactrim is completed. 07/11/22  Yes Irine Seal, MD  vitamin E 180 MG (400 UNITS) capsule Take 400 Units by mouth daily.    [provider]     Family History  Problem Relation Age of Onset   Cancer Other        Lung and bladder   Cancer Other        "Bone marrow cancer"    Social History   Socioeconomic History   Marital status: Married    Spouse name: Not on file   Number of children: 1   Years of education: Not on file   Highest education level: Not on file  Occupational History    Employer: RETIRED  Tobacco Use   Smoking status: Never    Passive exposure: Never   Smokeless tobacco: Never  Vaping Use   Vaping Use: Never used  Substance and Sexual Activity   Alcohol use: No   Drug use: No   Sexual activity: Not on file  Other Topics Concern   Not on file  Social History Narrative   Not on file   Social Determinants of Health   Financial Resource Strain: Not on  file  Food Insecurity: Not on file  Transportation Needs: Not on file  Physical Activity: Not on file  Stress: Not on file  Social Connections: Not on file      Review of Systems denies fever, headache, chest pain, dyspnea, cough, abdominal pain, nausea, vomiting or bleeding.  She has had some recent leg swelling, constipation, urinary frequency/urgency, back pain and joint pain  Vital Signs: BP 178/69  HR 62  R 16  TEMP 97.4  O2 sats 96% RA       Physical Exam awake, alert.  Chest with distant breath sounds bilaterally.  No wheezes.  Heart with regular rate and rhythm.  Abdomen soft, bowel sounds, nontender.  No significant lower extremity edema.  Imaging: No results found.  Labs:  CBC: Recent Labs    11/08/21 1420 08/11/22 1343 08/18/22 0745  WBC 7.3 5.4 7.4  HGB 13.7 13.6 13.8  HCT 41.4 41.1 41.4  PLT 270 306 355    COAGS: No results for input(s): "INR", "APTT" in the last 8760 hours.  BMP: Recent Labs    11/08/21 1420 08/11/22 1343  NA 135 136  K 4.1 4.5  CL 103 102  CO2 24 26  GLUCOSE 124* 98  BUN 11 8  CALCIUM 9.4 9.8  CREATININE 0.80 0.78  GFRNONAA >60 >60    LIVER FUNCTION TESTS: Recent Labs    11/08/21 1420  BILITOT 0.9  AST 77*  ALT 54*  ALKPHOS 53  PROT 7.7  ALBUMIN 3.8    TUMOR MARKERS: No results for input(s): "AFPTM", "CEA", "CA199", "CHROMGRNA" in the last 8760 hours.  Assessment and Plan: 87 y.o. female with past medical history of history of arthritis, paroxysmal atrial fibrillation, hypertension, overactive bladder and nephrolithiasis. She last had a CT  in 2/23 and had a 2.3cm laminated stone in the left renal pelvis and smaller right renal stones. There was a 26m stone in the LLP on a CT in 2020. She has a chronic UTI that are becoming more frequency and she will generally benefit from a course of antibiotics.  The last oral antibiotic didn't help and needed a injection. She was in the ER on 05/05/22 with constipation.  A  culture that day was negative.  She currently complains of malodorous urine over the last 2 weeks with malaise.  She has no dysuria.   She has some frequency and urgency.  She has not had recent hematuria.   She has had multilevel back surgery with fusion and her back pain gets worse with the infections, left > right.  Her UA looks infected.  She presents today for left percutaneous nephrostomy/nephroureteral catheter placement prior to nephrolithotomy.Risks and benefits of left PCN placement was discussed with the patient including, but not limited to, infection, bleeding, significant bleeding causing loss or decrease in renal function or damage to adjacent structures.   All of the patient's questions were answered, patient is agreeable to proceed.  Consent signed and in chart.      Thank you for this interesting consult.  I greatly enjoyed meeting RKIMBERLYN QUIOCHOand look forward to participating in their care.  A copy of this report was sent to the requesting provider on this date.  Electronically Signed: D. KRowe Robert PA-C 08/18/2022, 8:08 AM   I spent a total of  25 minutes   in face to face in clinical consultation, greater than 50% of which was counseling/coordinating care for left percutaneous nephrostomy/nephroureteral catheter placement

## 2022-08-18 NOTE — Progress Notes (Signed)
Patient ID: Tanya Mueller, female   DOB: [birth year], 87 y.o.   MRN: [id number]   She is doing well but has some left neck pain from postioning that she noticed in IR.  Urine is very light pink in NT and foley.  Hgb is stable at 13.8.     .BP (!) 157/62   Pulse 78   Temp 97.6 F (36.4 C)   Resp 13   Ht '5\' 6"'$  (1.676 m)   Wt 70.8 kg   SpO2 100%   BMI 25.19 kg/m   Plan for CT in AM to assess for residual fragement.   Foley out in AM.  NT out in AM if urine clear otherwise Sunday.  DC Sat/Sun.

## 2022-08-18 NOTE — Anesthesia Procedure Notes (Signed)
Procedure Name: Intubation Date/Time: 08/18/2022 12:18 PM  Performed by: Cleda Daub, CRNAPre-anesthesia Checklist: Patient identified, Emergency Drugs available, Suction available and Patient being monitored Patient Re-evaluated:Patient Re-evaluated prior to induction Oxygen Delivery Method: Circle system utilized Preoxygenation: Pre-oxygenation with 100% oxygen Induction Type: IV induction Ventilation: Mask ventilation without difficulty Laryngoscope Size: Mac and 3 Grade View: Grade I Tube type: Oral Tube size: 7.5 mm Number of attempts: 1 Airway Equipment and Method: Stylet and Oral airway Placement Confirmation: ETT inserted through vocal cords under direct vision, positive ETCO2 and breath sounds checked- equal and bilateral Secured at: 21 cm Tube secured with: Tape Dental Injury: Teeth and Oropharynx as per pre-operative assessment

## 2022-08-18 NOTE — Op Note (Signed)
Procedure: 1.  Left percutaneous nephrolithotomy for a 2.3 cm renal pelvic stone. 2.  Antegrade placement of a left double-J stent. 3.  Antegrade nephrostogram through existing tract with interpretation. 4.  Application of fluoroscopy.  Preop diagnosis: 2.3 cm left renal pelvic stone with smaller left lower pole stones and recurrent UTI.  Postop diagnosis: Same.  Surgeon: Dr. Irine Seal.  Anesthesia: General.  Specimen: Stone fragments.  Drains: 22 [place] council left nephrostomy catheter, Foley catheter and 6 [place] by 24 cm left double-J stent.  EBL: None.  Complications: None.  Indications: The patient is an 87 year old female who has recurrent urinary tract infections and was found to have a 2.3 cm left renal pelvic stone with some smaller lower pole stones and it was felt that removal of the stone was centered role to trying to illuminate her urinary tract infections.  Procedure: She was given Rocephin this morning and taken to IR were antegrade access was obtained.  She was then taken to the operating room where she was given gentamicin.  A general anesthetic was induced on the holding room stretcher and a Foley catheter was placed using sterile technique.  She was then rolled prone on chest rolls with care taken to pad all pressure points.  She was fitted with PAS hose.  Her left access site was prepped with ChloraPrep and draped in the usual sterile fashion.  An Amplatz Super Stiff wire was placed under fluoroscopic guidance through the access catheter into the bladder and the access catheter was removed.  The incision was lengthened to 2 cm with a knife.  A dual-lumen 8 French catheter was then passed over the wire into the proximal ureter and a second wire was then inserted.  Once the wire was in the mid ureter the dual-lumen catheter was removed and a nephro view catheter was used to help guide the second wire into the bladder atraumatically.  Once the second wire was in place the  NephroMax balloon was placed over the working wire and positioned fluoroscopically in with the tip in the renal pelvis.  The balloon was inflated to 20 atm without difficulty and the sheath was then advanced into the renal pelvis.  The balloon was deflated and removed and the rigid nephroscope was then advanced in the renal pelvis where the stone was easily visualized.  There was minimal bleeding.  The stone was then fragmented and aspirated using the trilogy lithotrite.  On CT the core of the stone appeared radiolucent and once the shell of the stone had been entered there appeared to be old clot that made up the center of the stone.  This was aspirated out without difficulty and then the shell of the stone was then fragmented and aspirated from the kidney.  The rigid scope was then removed and this flexible nephroscope was then advanced and the upper, lower and mid calyceal systems were inspected and no additional stones were visualized in the calyces.  The the rigid scope was then reinserted and an additional fragment was noted in the renal pelvis and that was managed with the trilogy once again.  The rigid scope was then replaced over the working wire and a 6 [place] by 24 cm contour double-J stent was advanced in an antegrade fashion under fluoroscopic guidance to the bladder.  The wire was removed, leaving a good coil in the bladder and a good coil in the kidney.  The nephroscope was removed and a 41 French [place] catheter was advanced through the  sheath into the renal pelvis under fluoroscopic guidance.  The sheath was backed out and approximately 2 mL of water was placed in the balloon.  An antegrade nephrostogram was then performed with Omnipaque.  Nephrostogram revealed no extravasation and good antegrade flow.  The sheath was removed to above the skin and the nephrostomy catheter was secured with a 2-0 silk suture which also tighten the wound.  There was no significant bleeding noted so the  drapes removed and the catheter was placed to straight drainage.  A dressing was applied.  She was then rolled supine on the recovery room stretcher, her anesthetic was reversed and she was moved to recovery in stable condition.  There were no complications.

## 2022-08-18 NOTE — Transfer of Care (Signed)
Immediate Anesthesia Transfer of Care Note  Patient: Tanya Mueller  Procedure(s) Performed: LEFT NEPHROLITHOTOMY PERCUTANEOUS (Left: Flank)  Patient Location: PACU  Anesthesia Type:General  Level of Consciousness: awake, alert , oriented, and patient cooperative  Airway & Oxygen Therapy: Patient Spontanous Breathing and Patient connected to face mask oxygen  Post-op Assessment: Report given to RN and Post -op Vital signs reviewed and stable  Post vital signs: Reviewed and stable  Last Vitals:  Vitals Value Taken Time  BP 156/85 08/18/22 1335  Temp    Pulse 72 08/18/22 1337  Resp 15 08/18/22 1337  SpO2 100 % 08/18/22 1337  Vitals shown include unvalidated device data.  Last Pain:  Vitals:   08/18/22 1040  PainSc: 0-No pain         Complications: No notable events documented.

## 2022-08-18 NOTE — Anesthesia Preprocedure Evaluation (Signed)
Anesthesia Evaluation  Patient identified by MRN, date of birth, ID band Patient awake    Reviewed: Allergy & Precautions, NPO status , Patient's Chart, lab work & pertinent test results, reviewed documented beta blocker date and time   History of Anesthesia Complications (+) AWARENESS UNDER ANESTHESIA and history of anesthetic complications  Airway Mallampati: II  TM Distance: >3 FB Neck ROM: Full    Dental  (+) Dental Advisory Given, Upper Dentures, Lower Dentures   Pulmonary neg pulmonary ROS   Pulmonary exam normal breath sounds clear to auscultation       Cardiovascular hypertension, Pt. on medications and Pt. on home beta blockers (-) angina (-) Past MI Normal cardiovascular exam+ dysrhythmias Atrial Fibrillation and Supra Ventricular Tachycardia  Rhythm:Regular Rate:Normal     Neuro/Psych negative neurological ROS  negative psych ROS   GI/Hepatic negative GI ROS, Neg liver ROS,,,  Endo/Other  negative endocrine ROS    Renal/GU Renal disease (Left renal stone)     Musculoskeletal  (+) Arthritis ,    Abdominal   Peds  Hematology negative hematology ROS (+)   Anesthesia Other Findings Day of surgery medications reviewed with the patient.  Reproductive/Obstetrics                             Anesthesia Physical Anesthesia Plan  ASA: 3  Anesthesia Plan: General   Post-op Pain Management: Tylenol PO (pre-op)*   Induction: Intravenous  PONV Risk Score and Plan: 3 and Dexamethasone and Ondansetron  Airway Management Planned: Oral ETT  Additional Equipment:   Intra-op Plan:   Post-operative Plan: Extubation in OR  Informed Consent: I have reviewed the patients History and Physical, chart, labs and discussed the procedure including the risks, benefits and alternatives for the proposed anesthesia with the patient or authorized representative who has indicated his/her understanding  and acceptance.     Dental advisory given  Plan Discussed with: CRNA  Anesthesia Plan Comments:        Anesthesia Quick Evaluation

## 2022-08-18 NOTE — Interval H&P Note (Signed)
History and Physical Interval Note:  No change.     08/18/2022 10:38 AM  Tanya Mueller  has presented today for surgery, with the diagnosis of LEFT RENAL STONE.  The various methods of treatment have been discussed with the patient and family. After consideration of risks, benefits and other options for treatment, the patient has consented to  Procedure(s) with comments: LEFT NEPHROLITHOTOMY PERCUTANEOUS (Left) - 2.5 HRS FOR CASE as a surgical intervention.  The patient's history has been reviewed, patient examined, no change in status, stable for surgery.  I have reviewed the patient's chart and labs.  Questions were answered to the patient's satisfaction.     Irine Seal

## 2022-08-18 NOTE — Anesthesia Postprocedure Evaluation (Signed)
Anesthesia Post Note  Patient: Ghislaine S Kreamer  Procedure(s) Performed: LEFT NEPHROLITHOTOMY PERCUTANEOUS (Left: Flank)     Patient location during evaluation: PACU Anesthesia Type: General Level of consciousness: awake and alert Pain management: pain level controlled Vital Signs Assessment: post-procedure vital signs reviewed and stable Respiratory status: spontaneous breathing, nonlabored ventilation, respiratory function stable and patient connected to nasal cannula oxygen Cardiovascular status: blood pressure returned to baseline and stable Postop Assessment: no apparent nausea or vomiting Anesthetic complications: no   No notable events documented.  Last Vitals:  Vitals:   08/18/22 1500 08/18/22 1515  BP: (!) 149/57 (!) 157/62  Pulse: 72 78  Resp: 13   Temp:    SpO2: 100% 100%    Last Pain:  Vitals:   08/18/22 1500  PainSc: [place] Yaeli Hartung

## 2022-08-19 ENCOUNTER — Encounter (HOSPITAL_COMMUNITY): Payer: Self-pay | Admitting: Urology

## 2022-08-19 ENCOUNTER — Observation Stay (HOSPITAL_COMMUNITY): Payer: PPO

## 2022-08-19 DIAGNOSIS — N200 Calculus of kidney: Secondary | ICD-10-CM | POA: Diagnosis not present

## 2022-08-19 LAB — HEMOGLOBIN AND HEMATOCRIT, BLOOD
HCT: 36.1 % (ref 36.0–46.0)
Hemoglobin: 12.1 g/dL (ref 12.0–15.0)

## 2022-08-19 MED ORDER — TAMSULOSIN HCL 0.4 MG PO CAPS: 0.4000 mg | ORAL_CAPSULE | Freq: Every day | ORAL | 0 refills | Status: DC

## 2022-08-19 MED ORDER — NITROFURANTOIN MONOHYD MACRO 100 MG PO CAPS: 100.0000 mg | ORAL_CAPSULE | Freq: Two times a day (BID) | ORAL | 0 refills | Status: AC

## 2022-08-19 MED ORDER — PHENOL 1.4 % MT LIQD: 1.0000 | OROMUCOSAL | Status: DC | PRN

## 2022-08-19 MED ORDER — OXYCODONE HCL 10 MG PO TABS: 10.0000 mg | ORAL_TABLET | Freq: Four times a day (QID) | ORAL | 0 refills | Status: AC | PRN

## 2022-08-19 NOTE — Progress Notes (Signed)
1 Day Post-Op Subjective: No acute events overnight. Feeling well this AM. Tolerating diet  Patient about to go down to CT  Objective: Vital signs in last 24 hours: Temp:  [97.4 F (36.3 C)-98.8 F (37.1 C)] 98.2 F (36.8 C) (11/18 0558) Pulse Rate:  [58-78] 62 (11/18 0558) Resp:  [12-18] 18 (11/18 0558) BP: (119-222)/(52-96) 134/56 (11/18 0558) SpO2:  [93 %-100 %] 97 % (11/18 0558) Weight:  [70.8 kg] 70.8 kg (11/17 1635)  Intake/Output from previous day: 11/17 0701 - 11/18 0700 In: 6.5 [I.V.:6.5] Out: 1505 [Urine:1500; Blood:5] Intake/Output this shift: No intake/output data recorded.  Physical Exam:  General: Alert and oriented CV: RRR Lungs: Clear Abdomen: Soft, ND, ATTP; inc c/d/I Ext: NT, No erythema Foley draining clear yellow, neph tube draining pink urine  Lab Results: Recent Labs    08/18/22 0745 08/18/22 1402 08/19/22 0455  HGB 13.8 13.8 12.1  HCT 41.4 41.4 36.1   BMET Recent Labs    08/18/22 0745  NA 133*  K 4.3  CL 98  CO2 29  GLUCOSE 101*  BUN 11  CREATININE 0.91  CALCIUM 9.7     Studies/Results: DG C-Arm 1-60 Min-No Report  Result Date: 08/18/2022 Fluoroscopy was utilized by the requesting physician.  No radiographic interpretation.   IR URETERAL STENT LEFT NEW ACCESS W/O SEP NEPHROSTOMY CATH  Result Date: 08/18/2022 INDICATION: Large left renal pelvis calculus EXAM: ULTRASOUND FLUOROSCOPIC LEFT NEPHROSTOMY ACCESS FOR OPERATIVE NEPHROLITHOTOMY COMPARISON:  11/08/2021 CT MEDICATIONS: 2 G ROCEPHIN; The antibiotic was administered in an appropriate time frame prior to skin puncture. ANESTHESIA/SEDATION: Moderate (conscious) sedation was employed during this procedure. A total of Versed 1.5 mg and Fentanyl 70 mcg was administered intravenously by the radiology nurse. Total intra-service moderate Sedation Time: 35 minutes. The patient's level of consciousness and vital signs were monitored continuously by radiology nursing throughout the  procedure under my direct supervision. CONTRAST:  10 CC-administered into the collecting system(s) FLUOROSCOPY: Radiation Exposure Index (as provided by the fluoroscopic device): 30 mGy Kerma COMPLICATIONS: None immediate. PROCEDURE: Informed written consent was obtained from the patient after a thorough discussion of the procedural risks, benefits and alternatives. All questions were addressed. Maximal Sterile Barrier Technique was utilized including caps, mask, sterile gowns, sterile gloves, sterile drape, hand hygiene and skin antiseptic. A timeout was performed prior to the initiation of the procedure. previous imaging reviewed. patient position prone. preliminary ultrasound performed. the left kidney lower pole posterior calyx containing an echogenic calculus was localized and marked. under sterile conditions and local anesthesia, an 18 gauge 15 cm access was advanced percutaneously under ultrasound to the lower pole posterior calyx containing a stone. stone was felt on the needle tip. there was return of trace urine. contrast injection confirms position within the calyx. bentson guidewire inserted which was coiled in the renal pelvis adjacent to the large stone. five french catheter advanced. catheter guidewire access manipulated past the stone into the ureter and advanced into the bladder. contrast injection confirms position in the bladder. images obtained for documentation. access secured with 0 silk suture. external caps and a sterile dressing applied. patient tolerated the procedure well. no immediate complication. IMPRESSION: Successful left percutaneous nephrostomy catheter placement with guidewire access past the stone into the bladder. Electronically Signed   By: Jerilynn Mages.  Shick M.D.   On: 08/18/2022 10:20    Assessment/Plan: AUTRY DROEGE is POD 1 s/p L PCNL  --CT scan pending; after results reviewed could likely review neph tube --foley out -- likely home  later today   LOS: 0 days   Donald Pore  MD 08/19/2022, 7:05 AM [facility]  Pager: [phone]

## 2022-08-19 NOTE — Plan of Care (Incomplete)
Pt A&OX4, pt still had some pain. Waiting voiding for pt to D/C. Personal belongings returned. IV taken out

## 2022-08-19 NOTE — TOC Progression Note (Signed)
Transition of Care [facility]) - Progression Note    Patient Details  Name: OLISA QUESNEL MRN: [id number] Date of Birth: 1935-06-27  Transition of Care [facility]) CM/SW Contact  Henrietta Dine, RN Phone Number: 08/19/2022, 10:59 AM  Clinical Narrative:     Transition of Care (TOC) Screening Note   Patient Details  Name: ILITHYIA TITZER Date of Birth: March 15, 1935   Transition of Care [facility]) CM/SW Contact:    Henrietta Dine, RN Phone Number: 08/19/2022, 10:59 AM    Transition of Care Department [facility]) has reviewed patient and no TOC needs have been identified at this time. We will continue to monitor patient advancement through interdisciplinary progression rounds. If new patient transition needs arise, please place a TOC consult.          Expected Discharge Plan and Services           Expected Discharge Date: 08/19/22                                     Social Determinants of Health (SDOH) Interventions    Readmission Risk Interventions     No data to display

## 2022-08-19 NOTE — Discharge Summary (Signed)
Date of admission: 08/18/2022  Date of discharge: 08/19/2022  Admission diagnosis:  Renal stone [N20.0]   Discharge diagnosis:  Renal stone [N20.0]  Secondary diagnoses:   Active Ambulatory Problems    Diagnosis Date Noted   Palpitations 05/06/2012   Kidney stone 08/09/2014   Ureteral calculus 08/09/2014   Shortness of breath 04/28/2017   Hypokalemia 04/30/2017   Hypomagnesemia 04/30/2017   Hyponatremia 04/30/2017   PVC's (premature ventricular contractions) 04/30/2017   Ankle swelling 12/15/2020   Essential hypertension 12/15/2020   Therapeutic opioid-induced constipation (OIC) 05/04/2022   Resolved Ambulatory Problems    Diagnosis Date Noted   No Resolved Ambulatory Problems   Past Medical History:  Diagnosis Date   Allergic rhinitis    Arthritis    Complication of anesthesia    Dysrhythmia    History of atrial fibrillation    History of kidney stones    History of sepsis    Hypertension    Left nephrolithiasis    Left ureteral calculus    PAC (premature atrial contraction)    Solitary lung nodule      History and Physical: For full details, please see admission history and physical. Briefly, Tanya Mueller is a 87 y.o. year old patient who was admitted after L PCN.   Hospital Course: neph tube and foley removed POD 1.  On the day of discharge, the patient was tolerating a regular diet and their pain was well controlled. They were determined to be stable for discharge home  Laboratory values:  Recent Labs    08/18/22 0745 08/18/22 1402 08/19/22 0455  HGB 13.8 13.8 12.1  HCT 41.4 41.4 36.1   Recent Labs    08/18/22 0745  CREATININE 0.91    Disposition: Home  Discharge medications:  Allergies as of 08/19/2022   No Known Allergies      Medication List     TAKE these medications    acetaminophen 325 MG tablet Commonly known as: TYLENOL Take 325 mg by mouth every 6 (six) hours as needed for moderate pain. Take with oxycodone   amLODipine 5  MG tablet Commonly known as: NORVASC Take 5 mg by mouth daily.   fluticasone 50 MCG/ACT nasal spray Commonly known as: FLONASE Place 2 sprays into the nose daily as needed for allergies.   losartan 100 MG tablet Commonly known as: COZAAR TAKE 1 TABLET ONCE DAILY.   lubiprostone 24 MCG capsule Commonly known as: AMITIZA Take 1 capsule (24 mcg total) by mouth 2 (two) times daily with a meal.   metoprolol tartrate 25 MG tablet Commonly known as: LOPRESSOR TAKE (1) TABLET TWICE DAILY.   nitrofurantoin (macrocrystal-monohydrate) 100 MG capsule Commonly known as: Macrobid Take 1 capsule (100 mg total) by mouth 2 (two) times daily for 7 days.   ondansetron 4 MG tablet Commonly known as: ZOFRAN Take 1 tablet (4 mg total) by mouth every 8 (eight) hours as needed for nausea or vomiting.   Oxycodone HCl 10 MG Tabs Take 1 tablet (10 mg total) by mouth every 6 (six) hours as needed for up to 3 days (pain).   polyethylene glycol 17 g packet Commonly known as: MIRALAX / GLYCOLAX Take 17 g by mouth daily as needed for moderate constipation.   senna-docusate 8.6-50 MG tablet Commonly known as: Senokot-S Take 1 tablet by mouth daily as needed for mild constipation.   tamsulosin 0.4 MG Caps capsule Commonly known as: FLOMAX Take 1 capsule (0.4 mg total) by mouth daily after supper.  trimethoprim 100 MG tablet Commonly known as: TRIMPEX Take 1 tablet (100 mg total) by mouth at bedtime. Start once Bactrim is completed.   vitamin E 180 MG (400 UNITS) capsule Generic drug: vitamin E Take 400 Units by mouth daily.               Discharge Care Instructions  (From admission, onward)           Start     Ordered   08/19/22 0000  If the dressing is still on your incision site when you go home, remove it on the third day after your surgery date. Remove dressing if it begins to fall off, or if it is dirty or damaged before the third day.        08/19/22 1030             Followup:   Follow-up Information     Irine Seal, MD Follow up on 09/14/2022.   Specialty: Urology Why: 2:15 Contact information: [place] [address] [phone]

## 2022-08-19 NOTE — Plan of Care (Signed)
PT d/c to home with family. IV removed. Wound care provided. BP (!) 178/81   Pulse 75   Temp 97.7 F (36.5 C) (Oral)   Resp 18   Ht '5\' 6"'$  (1.676 m)   Wt 70.8 kg   SpO2 100%   BMI 25.19 kg/m   No c/o pain at this time. Personal belongings returned med rec/AVS reviewed with no questions. Pt taken off floor by staff. Louanne Skye 08/19/22 1:55 PM

## 2022-08-19 NOTE — Progress Notes (Signed)
Mobility Specialist - Progress Note   08/19/22 1009  Mobility  Activity Ambulated independently in room  Level of Assistance Modified independent, requires aide device or extra time  Assistive Device None  Distance Ambulated (ft) 10 ft  Range of Motion/Exercises Active  Activity Response Tolerated well  Mobility Referral Yes  $Mobility charge 1 Mobility   Pt was found in bed and agreeable to ambulate. Had no complaints and at EOS returned to recliner chair with necessities in reach and daughter in room.  Ferd Hibbs Mobility Specialist

## 2022-08-19 NOTE — Discharge Instructions (Signed)

## 2022-08-28 ENCOUNTER — Other Ambulatory Visit: Payer: Self-pay | Admitting: Urology

## 2022-08-28 ENCOUNTER — Telehealth: Payer: Self-pay | Admitting: Cardiology

## 2022-08-28 NOTE — Telephone Encounter (Signed)
    Patient Name: ANNLEIGH KNUEPPEL  DOB: Oct 16, 1934 MRN: [id number]  Primary Cardiologist: Minus Breeding, MD  Chart reviewed as part of pre-operative protocol coverage.   The patient already has an upcoming appointment scheduled TOMORROW with Dr. Dellia Cloud at which time this clearance should be addressed, since procedure date is not until 09/16/11. The clearance asks to address blood thinners but I do not see any listed on MAR, can be reviewed further at tomorrow's visit. I added "preop" comment to appointment notes so that provider is aware to address at time of OV. Per office protocol, the provider seeing this patient should forward their finalized clearance decision to requesting party below.  Will fax update to requesting surgeon so they are aware. Will remove from preop box as separate preop APP input not necessary at this time.  Charlie Pitter, PA-C 08/28/2022, 4:09 PM

## 2022-08-28 NOTE — Telephone Encounter (Signed)
   [facility] Pre-operative Risk Assessment    Request for surgical clearance:  What type of surgery is being performed?  Left Ureteroscopy   When is this surgery scheduled?  09/15/22   What type of clearance is required (medical clearance vs. Pharmacy clearance to hold med vs. Both)?  Both   Are there any medications that need to be held prior to surgery and how long? Their office is requesting our advisement regarding blood thinners  Practice name and name of physician performing surgery?  [facility]  Dr. Jeffie Pollock   What is your office phone number? [phone] (ext: [phone])    7.   What is your office fax number? [phone]  8.   Anesthesia type (None, local, MAC, general) ?  General    Tanya Mueller 08/28/2022, 3:21 PM

## 2022-08-29 ENCOUNTER — Ambulatory Visit: Payer: PPO | Attending: Internal Medicine | Admitting: Internal Medicine

## 2022-08-29 ENCOUNTER — Encounter: Payer: Self-pay | Admitting: Internal Medicine

## 2022-08-29 VITALS — BP 136/64 | HR 75 | Ht 66.0 in | Wt 161.0 lb

## 2022-08-29 DIAGNOSIS — I4891 Unspecified atrial fibrillation: Secondary | ICD-10-CM | POA: Diagnosis not present

## 2022-08-29 DIAGNOSIS — Z5181 Encounter for therapeutic drug level monitoring: Secondary | ICD-10-CM | POA: Insufficient documentation

## 2022-08-29 DIAGNOSIS — Z01810 Encounter for preprocedural cardiovascular examination: Secondary | ICD-10-CM | POA: Insufficient documentation

## 2022-08-29 MED ORDER — METOPROLOL TARTRATE 50 MG PO TABS: 50.0000 mg | ORAL_TABLET | Freq: Two times a day (BID) | ORAL | 3 refills | Status: DC

## 2022-08-29 MED ORDER — AMLODIPINE BESYLATE 2.5 MG PO TABS: 2.5000 mg | ORAL_TABLET | Freq: Every day | ORAL | 3 refills | Status: DC

## 2022-08-29 MED ORDER — LOSARTAN POTASSIUM 50 MG PO TABS: 50.0000 mg | ORAL_TABLET | Freq: Every day | ORAL | 3 refills | Status: DC

## 2022-08-29 NOTE — Progress Notes (Signed)
Cardiology Office Note  Date: 08/29/2022   ID: [name], DOB Jul 28, 1935, MRN [id number]  PCP:  Manon Hilding, MD  Cardiologist:  Minus Breeding, MD Electrophysiologist:  None   Reason for Office Visit: Preop clearance and palpitations, prior patient to Dr. Bronson Ing   History of Present Illness: Tanya Mueller is a 87 y.o. female known to have HTN presented to cardiology clinic for preop cardiac risk stratification for cystoscopy. She was previously followed by Dr. Bronson Ing for palpitations for which she had a event monitor in 2018 that showed symptomatic PACs and echo showed normal LVEF and no valve abnormalities. She is here today for preop cardiac risk stratification for cystoscopy scheduled on 09/15/2022 and also for evaluation of palpitations.  Patient noticed recent worsening of palpitations since 2 to 3 months, happening almost every day and lasting for hours. Similar palpitations also occurred in the past but with less frequency and less duration. She wore her daughter's Apple Watch in the last 1 week on random occasions and the strips showed atrial fibrillation. The strips are uploaded to the media section.Thereafter, EKG is also performed today which showed atrial fibrillation, rate controlled. She has been taking metoprolol to 25 mg twice daily which is not helping with her palpitations. Denies any syncope/angina/DOE.  Past Medical History:  Diagnosis Date   Allergic rhinitis    Arthritis    Back ,hips   Complication of anesthesia    awareness during general anesthesia.   Dysrhythmia    A-Fib and PSVT   History of atrial fibrillation    episode 2013 converted with cardizem-- happened at time acute illness due to kidney stone --  resolved  no issues since   History of kidney stones    History of sepsis    urosepsis  2013 due to kidney stone   Hypertension    Left nephrolithiasis    Left ureteral calculus    PAC (premature atrial contraction)    Solitary lung  nodule    benign right middle lobe per CT    Past Surgical History:  Procedure Laterality Date   BACK SURGERY  2015   rod and screws   CATARACT EXTRACTION W/ INTRAOCULAR LENS  IMPLANT, BILATERAL  10/02/2008   CYSTOSCOPY WITH BIOPSY N/A 04/08/2019   Procedure: CYSTOSCOPY WITH bladder BIOPSY, FULGURATION;  Surgeon: Festus Aloe, MD;  Location: [facility];  Service: Urology;  Laterality: N/A;   CYSTOSCOPY WITH STENT PLACEMENT Left 09/15/2014   Procedure: CYSTOSCOPY WITH STENT PLACEMENT;  Surgeon: Georgette Dover, MD;  Location: [facility];  Service: Urology;  Laterality: Left;   CYSTOSCOPY/RETROGRADE/URETEROSCOPY Left 08/09/2014   Procedure: CYSTOSCOPY/RETROGRADE/LEFT STENT;  Surgeon: Bernestine Amass, MD;  Location: [facility];  Service: Urology;  Laterality: Left;   EXTRACORPOREAL SHOCK WAVE LITHOTRIPSY  10/03/2011   HOLMIUM LASER APPLICATION Left 03/27/9484   Procedure: HOLMIUM LASER APPLICATION;  Surgeon: Georgette Dover, MD;  Location: [facility];  Service: Urology;  Laterality: Left;   IR URETERAL STENT LEFT NEW ACCESS W/O SEP NEPHROSTOMY CATH  08/18/2022   LAPAROTOMY W/  UNILATERAL SALPINGOOPHORECTOMY  age 53   NEPHROLITHOTOMY Left 08/18/2022   Procedure: LEFT NEPHROLITHOTOMY PERCUTANEOUS;  Surgeon: Irine Seal, MD;  Location: [facility];  Service: Urology;  Laterality: Left;  2.5 HRS FOR CASE   TOTAL ABDOMINAL HYSTERECTOMY  age 34   w/ unilateral salpingoophorectomy   TRANSTHORACIC ECHOCARDIOGRAM  02/13/2012   mild LVH/  ef 65%/  trivial MR  &  PR/  mild RVE   URETEROSCOPY Left 09/15/2014   Procedure: URETEROSCOPY;  Surgeon: Georgette Dover, MD;  Location: [facility];  Service: Urology;  Laterality: Left;    Current Outpatient Medications  Medication Sig Dispense Refill   acetaminophen (TYLENOL) 325 MG tablet Take 325 mg by mouth every 6 (six) hours as needed for moderate pain. Take with oxycodone     amLODipine (NORVASC) 2.5 MG  tablet Take 1 tablet (2.5 mg total) by mouth daily. 90 tablet 3   fluticasone (FLONASE) 50 MCG/ACT nasal spray Place 2 sprays into the nose daily as needed for allergies.      losartan (COZAAR) 50 MG tablet Take 1 tablet (50 mg total) by mouth daily. 90 tablet 3   lubiprostone (AMITIZA) 24 MCG capsule Take 1 capsule (24 mcg total) by mouth 2 (two) times daily with a meal. 60 capsule 3   metoprolol tartrate (LOPRESSOR) 50 MG tablet Take 1 tablet (50 mg total) by mouth 2 (two) times daily. 180 tablet 3   ondansetron (ZOFRAN) 4 MG tablet Take 1 tablet (4 mg total) by mouth every 8 (eight) hours as needed for nausea or vomiting. 30 tablet 2   polyethylene glycol (MIRALAX / GLYCOLAX) 17 g packet Take 17 g by mouth daily as needed for moderate constipation.     senna-docusate (SENOKOT-S) 8.6-50 MG tablet Take 1 tablet by mouth daily as needed for mild constipation.     tamsulosin (FLOMAX) 0.4 MG CAPS capsule Take 1 capsule (0.4 mg total) by mouth daily after supper. 30 capsule 0   trimethoprim (TRIMPEX) 100 MG tablet Take 1 tablet (100 mg total) by mouth at bedtime. Start once Bactrim is completed. 30 tablet 5   vitamin E 180 MG (400 UNITS) capsule Take 400 Units by mouth daily.     No current facility-administered medications for this visit.   Allergies:  Patient has no known allergies.   Social History: The patient  reports that she has never smoked. She has never been exposed to tobacco smoke. She has never used smokeless tobacco. She reports that she does not drink alcohol and does not use drugs.   Family History: The patient's family history includes Cancer in some other family members.   ROS:  Please see the history of present illness. Otherwise, complete review of systems is positive for none.  All other systems are reviewed and negative.   Physical Exam: VS:  BP 136/64   Pulse 75   Ht '5\' 6"'$  (1.676 m)   Wt 161 lb (73 kg)   SpO2 98%   BMI 25.99 kg/m , BMI Body mass index is 25.99  kg/m.  Wt Readings from Last 3 Encounters:  08/29/22 161 lb (73 kg)  08/18/22 156 lb 1.4 oz (70.8 kg)  08/18/22 156 lb 1.4 oz (70.8 kg)    General: Patient appears comfortable at rest. HEENT: Conjunctiva and lids normal, oropharynx clear with moist mucosa. Neck: Supple, no elevated JVP or carotid bruits, no thyromegaly. Lungs: Clear to auscultation, nonlabored breathing at rest. Cardiac: Regular rate and rhythm, no S3 or significant systolic murmur, no pericardial rub. Abdomen: Soft, nontender, no hepatomegaly, bowel sounds present, no guarding or rebound. Extremities: No pitting edema, distal pulses 2+. Skin: Warm and dry. Musculoskeletal: No kyphosis. Neuropsychiatric: Alert and oriented x3, affect grossly appropriate.  ECG:  Atrial Fibrillation  Recent Labwork: 11/08/2021: ALT 54; AST 77 08/18/2022: BUN 11; Creatinine, Ser 0.91; Platelets 355; Potassium 4.3; Sodium 133 08/19/2022: Hemoglobin 12.1  No results  found for: "CHOL", "TRIG", "HDL", "CHOLHDL", "VLDL", "LDLCALC", "LDLDIRECT"  Other Studies Reviewed Today: I personally reviewed the Holter monitor and echo report from 2018 Event monitor from 2018 showed PACs but no atrial fibrillation Echo from 2018 showed normal LVEF and no valve abnormalities  Assessment and Plan: Patient is a 87 year old F known to have HTN presented to cardiology clinic for preop cardiac risk stratification of cystoscopy.  # New onset, persistent atrial fibrillation, CV score is 4 -EKG today showed atrial fibrillation.  Apple watch strips in the last 1 week on random occasions showed atrial fibrillation, rate controlled. I suspect if this is persistent atrial fibrillation. -Increase metoprolol tartrate from 25 mg to 50 mg twice a day. Patient is instructed to call the clinic if she continues to have palpitations with the increased dose of metoprolol tartrate. In addition, if patient has dizziness/lightheadedness with the above regimen, she will need  2-week event monitor to evaluate for tachybradycardia syndrome. -This patient's CHA2DS2-VASc score is 4. She will benefit from systemic anticoagulation to prevent stroke.  I discussed the risk of being on anticoagulation including intracranial hemorrhage. I discussed the anticoagulation options, DOACs versus Coumadin. DOAC's being cost prohibitive, patient preferred to go with Coumadin. Start Coumadin 3 mg once daily. Referral to Coumadin clinic. -Obtain 2D echo  # Preop cardiac risk stratification for cystoscopy -Patient denied any angina or DOE. EKG showed new onset of atrial fibrillation, possibly persistent. Echo from 2018 showed normal LVEF and no valvular abnormalities.  I am repeating her echocardiogram to update the LVEF and any new valvular abnormalities. She is currently on rate controlling agents and systemic anticoagulation is started for her atrial fibrillation. Her palpitation symptoms need to be better controlled prior to the procedure. Otherwise, she is at a low risk for any peri-operative cardiac events for a intermediate risk procedure, cystoscopy. No further cardiac testing is indicated prior to proceeding with the planned procedure.  -Check INR 1 week prior to the procedure. If INR > 2, stop warfarin 5 days prior to the procedure.  If INR < 2, stop warfarin 3 days prior to the procedure. No indication for bridging anticoagulation before the procedure. Resume warfarin with bridging anticoagulation after the procedure whenever stable from bleeding standpoint.  # HTN, controlled -Decrease the dose of losartan from 100 mg to 50 mg once daily to allow room for metoprolol dose change. -Patient is already taking amlodipine 2.5 mg once daily instead of 5 mg once daily due to ankle swelling.  Continue taking 2.5 mg dose.  I have spent a total of 35 minutes with patient reviewing chart, EKGs, labs and examining patient as well as establishing an assessment and plan that was discussed with the  patient.  > 50% of time was spent in direct patient care.     Medication Adjustments/Labs and Tests Ordered: Current medicines are reviewed at length with the patient today.  Concerns regarding medicines are outlined above.   Tests Ordered: Orders Placed This Encounter  Procedures   Ambulatory referral to Anticoagulation Monitoring   EKG 12-Lead   ECHOCARDIOGRAM COMPLETE    Medication Changes: Meds ordered this encounter  Medications   amLODipine (NORVASC) 2.5 MG tablet    Sig: Take 1 tablet (2.5 mg total) by mouth daily.    Dispense:  90 tablet    Refill:  3   metoprolol tartrate (LOPRESSOR) 50 MG tablet    Sig: Take 1 tablet (50 mg total) by mouth 2 (two) times daily.    Dispense:  180 tablet    Refill:  3   losartan (COZAAR) 50 MG tablet    Sig: Take 1 tablet (50 mg total) by mouth daily.    Dispense:  90 tablet    Refill:  3    Disposition:  Follow up  1 month  Signed, Kili Gracy Fidel Levy, MD, 08/29/2022 4:42 PM    [facility] [place] [address] [place], [place] [address]

## 2022-08-29 NOTE — Patient Instructions (Signed)
Medication Instructions:  Your physician has recommended you make the following change in your medication:  -Decrease Losartan to 50 mg tablets daily -Decrease Amlodipine to 2.5 mg tablets daily -Increase Metoprolol Tartrate to 50 mg tablets twice daily  If palpitations have not gotten better in 1 week please call our office.Tanya Mueller: None  Testing/Procedures: Your physician has requested that you have an echocardiogram. Echocardiography is a painless test that uses sound waves to create images of your heart. It provides your doctor with information about the size and shape of your heart and how well your heart's chambers and valves are working. This procedure takes approximately one hour. There are no restrictions for this procedure. Please do NOT wear cologne, perfume, aftershave, or lotions (deodorant is allowed). Please arrive 15 minutes prior to your appointment time.   Follow-Up: Follow up with Dr. Dellia Cloud in 1 month.   Any Other Special Instructions Will Be Listed Below (If Applicable).     If you need a refill on your cardiac medications before your next appointment, please call your pharmacy.

## 2022-08-30 MED ORDER — WARFARIN SODIUM 3 MG PO TABS: 3.0000 mg | ORAL_TABLET | Freq: Every day | ORAL | 3 refills | Status: DC

## 2022-08-30 NOTE — Telephone Encounter (Signed)
Called pt to initiate coumadin therapy in setting of AF per Dr Dellia Cloud.  Pt/daughter are in agreement.  Pt will start warfarin '3mg'$  (1) tablet daily in the evening on 08/31/22.  Pt will come in for INR appt on 09/07/22.  She is pending a cystoscopy on 09/15/22.  She will need to hold warfarin 3 - 5 days before procedure based on INR results on 09/07/22.  (Mallipeddi's note)  Rx for warfarin sent to [facility].

## 2022-09-04 ENCOUNTER — Telehealth: Payer: Self-pay

## 2022-09-04 ENCOUNTER — Telehealth: Payer: Self-pay | Admitting: Internal Medicine

## 2022-09-04 NOTE — Patient Instructions (Signed)
DUE TO COVID-19 ONLY TWO VISITORS  (aged 85 and older)  ARE ALLOWED TO COME WITH YOU AND STAY IN THE WAITING ROOM ONLY DURING PRE OP AND PROCEDURE.   **NO VISITORS ARE ALLOWED IN THE SHORT STAY AREA OR RECOVERY ROOM!!**  IF YOU WILL BE ADMITTED INTO THE HOSPITAL YOU ARE ALLOWED ONLY FOUR SUPPORT PEOPLE DURING VISITATION HOURS ONLY (7 AM -8PM)   The support person(s) must pass our screening, gel in and out, and wear a mask at all times, including in the patient's room. Patients must also wear a mask when staff or their support person are in the room. Visitors GUEST BADGE MUST BE WORN VISIBLY  One adult visitor may remain with you overnight and MUST be in the room by 8 P.M.     Your procedure is scheduled on: 09/15/22   Report to [facility] Main Entrance    Report to admitting at : 6:45 AM   Call this number if you have problems the morning of surgery [phone]   Do not eat food :After Midnight.   After Midnight you may have the following liquids until : 6:00 AM DAY OF SURGERY  Water Black Coffee (sugar ok, NO MILK/CREAM OR CREAMERS)  Tea (sugar ok, NO MILK/CREAM OR CREAMERS) regular and decaf                             Plain Jell-O (NO RED)                                           Fruit ices (not with fruit pulp, NO RED)                                     Popsicles (NO RED)                                                                  Juice: apple, WHITE grape, WHITE cranberry Sports drinks like Gatorade (NO RED)    Oral Hygiene is also important to reduce your risk of infection.                                    Remember - BRUSH YOUR TEETH THE MORNING OF SURGERY WITH YOUR REGULAR TOOTHPASTE  DENTURES WILL BE REMOVED PRIOR TO SURGERY PLEASE DO NOT APPLY "Poly grip" OR ADHESIVES!!!   Do NOT smoke after Midnight   Take these medicines the morning of surgery with A SIP OF WATER: metoprolol,amlodipine.  Bring CPAP mask and tubing day of surgery.                               You may not have any metal on your body including hair pins, jewelry, and body piercing             Do not wear make-up, lotions, powders, perfumes/cologne, or deodorant  Do not wear  nail polish including gel and S&S, artificial/acrylic nails, or any other type of covering on natural nails including finger and toenails. If you have artificial nails, gel coating, etc. that needs to be removed by a nail salon please have this removed prior to surgery or surgery may need to be canceled/ delayed if the surgeon/ anesthesia feels like they are unable to be safely monitored.   Do not shave  48 hours prior to surgery.    Do not bring valuables to the hospital. [place].   Contacts, glasses, or bridgework may not be worn into surgery.   Bring small overnight bag day of surgery.   DO NOT [place]. PHARMACY WILL DISPENSE MEDICATIONS LISTED ON YOUR MEDICATION LIST TO YOU DURING YOUR ADMISSION [place]!    Patients discharged on the day of surgery will not be allowed to drive home.  Someone NEEDS to stay with you for the first 24 hours after anesthesia.   Special Instructions: Bring a copy of your healthcare power of attorney and living will documents         the day of surgery if you haven't scanned them before.              Please read over the following fact sheets you were given: IF YOU HAVE QUESTIONS ABOUT YOUR PRE-OP INSTRUCTIONS PLEASE CALL [phone]    [facility] - Preparing for Surgery Before surgery, you can play an important role.  Because skin is not sterile, your skin needs to be as free of germs as possible.  You can reduce the number of germs on your skin by washing with CHG (chlorahexidine gluconate) soap before surgery.  CHG is an antiseptic cleaner which kills germs and bonds with the skin to continue killing germs even after washing. Please DO NOT use if you have an allergy to  CHG or antibacterial soaps.  If your skin becomes reddened/irritated stop using the CHG and inform your nurse when you arrive at Short Stay. Do not shave (including legs and underarms) for at least 48 hours prior to the first CHG shower.  You may shave your face/neck. Please follow these instructions carefully:  1.  Shower with CHG Soap the night before surgery and the  morning of Surgery.  2.  If you choose to wash your hair, wash your hair first as usual with your  normal  shampoo.  3.  After you shampoo, rinse your hair and body thoroughly to remove the  shampoo.                           4.  Use CHG as you would any other liquid soap.  You can apply chg directly  to the skin and wash                       Gently with a scrungie or clean washcloth.  5.  Apply the CHG Soap to your body ONLY FROM THE NECK DOWN.   Do not use on face/ open                           Wound or open sores. Avoid contact with eyes, ears mouth and genitals (private parts).  Wash face,  Genitals (private parts) with your normal soap.             6.  Wash thoroughly, paying special attention to the area where your surgery  will be performed.  7.  Thoroughly rinse your body with warm water from the neck down.  8.  DO NOT shower/wash with your normal soap after using and rinsing off  the CHG Soap.                9.  Pat yourself dry with a clean towel.            10.  Wear clean pajamas.            11.  Place clean sheets on your bed the night of your first shower and do not  sleep with pets. Day of Surgery : Do not apply any lotions/deodorants the morning of surgery.  Please wear clean clothes to the hospital/surgery center.  FAILURE TO FOLLOW THESE INSTRUCTIONS MAY RESULT IN THE CANCELLATION OF YOUR SURGERY PATIENT SIGNATURE_________________________________  NURSE SIGNATURE__________________________________  ________________________________________________________________________

## 2022-09-04 NOTE — Telephone Encounter (Signed)
Daughter voiced that mom made just do the stent removal and wait to do the stone removal at a later time but not sure at this time. Made daughter aware that once her and mom make a decision to please call the office. Patient is going to [facility] tomorrow for blood work up for the upcoming surgery. Made daughter aware that I will f/u with surgery scheduler for the sten removal. Daughter voiced understanding

## 2022-09-04 NOTE — Telephone Encounter (Signed)
Reports palpitations have improved and only feels it when placing hand on neck. Resting HR ranges between 90's-100's with highest resting HR was 119 2-3 times in 1&1/2 hour time span. Average resting HR has been 75-80. Also continues to have bilateral shoulder pain that goes into neck. Says she reported to provider at office visit. Advised to report this to her PCP. Says she takes pain medication daily and has not felt any relief. Says massaging shoulders improves pain. Medications reviewed. Advised that update will go to provider as requested. Verbalized understanding.

## 2022-09-04 NOTE — Telephone Encounter (Signed)
Patient daughter called in about mom, Mom has AFIB and she has been clear through cardiologist. Patient is experiencing pain in both shoulders and she concern about if the stent need to come out first and then proceed later with removing the stones. Patient is concern about being under for a long period of time. Made daughter aware that I will send a urgent message to Dr. Jeffie Pollock and someone will reach back out to let her know the next steps. Daughter voiced understanding

## 2022-09-04 NOTE — Telephone Encounter (Signed)
Daughter was calling to give update on update and states other concerns. Please advise

## 2022-09-05 ENCOUNTER — Other Ambulatory Visit: Payer: Self-pay

## 2022-09-05 ENCOUNTER — Encounter (HOSPITAL_COMMUNITY)
Admission: RE | Admit: 2022-09-05 | Discharge: 2022-09-05 | Disposition: A | Discharge status: Home / Self Care | Payer: PPO | Source: Ambulatory Visit | Attending: Urology | Admitting: Urology

## 2022-09-05 ENCOUNTER — Encounter (HOSPITAL_COMMUNITY): Payer: Self-pay

## 2022-09-05 VITALS — BP 150/80 | HR 80 | Temp 98.0°F | Ht 66.0 in | Wt 162.0 lb

## 2022-09-05 DIAGNOSIS — Z01812 Encounter for preprocedural laboratory examination: Secondary | ICD-10-CM | POA: Diagnosis not present

## 2022-09-05 DIAGNOSIS — N200 Calculus of kidney: Secondary | ICD-10-CM | POA: Insufficient documentation

## 2022-09-05 DIAGNOSIS — I4891 Unspecified atrial fibrillation: Secondary | ICD-10-CM | POA: Insufficient documentation

## 2022-09-05 DIAGNOSIS — I10 Essential (primary) hypertension: Secondary | ICD-10-CM | POA: Insufficient documentation

## 2022-09-05 HISTORY — DX: Malignant (primary) neoplasm, unspecified: C80.1

## 2022-09-05 HISTORY — DX: Myoneural disorder, unspecified: G70.9

## 2022-09-05 NOTE — Progress Notes (Signed)
For Short Stay: [name] date:  Bowel Prep reminder:   For Anesthesia: PCP - Dr. Azzie Almas Mallipeddi: LOV: 08/29/22 Cardiologist - Dr. Minus Breeding: Clearance: 08/28/22: EPIC  Chest x-ray -  EKG - 08/29/22 Stress Test -  ECHO - 08/29/22 Cardiac Cath -  Pacemaker/ICD device last checked: Pacemaker orders received: Device Rep notified:  Spinal Cord Stimulator:  Sleep Study -  CPAP -   Fasting Blood Sugar -  Checks Blood Sugar _____ times a day Date and result of last Hgb A1c-  Last dose of GLP1 agonist-  GLP1 instructions:   Last dose of SGLT-2 inhibitors-  SGLT-2 instructions:   Blood Thinner Instructions: Coumadin can be hold 3-5 days before surgery: Dr. Percival Spanish. Pt. Daughter will call MD. To get coumadin instructions. Aspirin Instructions: Last Dose:  Activity level: Can go up a flight of stairs and activities of daily living without stopping and without chest pain and/or shortness of breath   Able to exercise without chest pain and/or shortness of breath   Unable to go up a flight of stairs without chest pain and/or shortness of breath     Anesthesia review: Hx: HTN,Afib  Patient denies shortness of breath, fever, cough and chest pain at PAT appointment   Patient verbalized understanding of instructions that were given to them at the PAT appointment. Patient was also instructed that they will need to review over the PAT instructions again at home before surgery.

## 2022-09-05 NOTE — Telephone Encounter (Signed)
Made patient aware that Dr. Jeffie Pollock will do the stent removal. Made patient aware to contact [facility] to speak to [facility] to cancel her surgery. Patient voiced understanding

## 2022-09-07 ENCOUNTER — Ambulatory Visit: Payer: PPO | Attending: Internal Medicine | Admitting: *Deleted

## 2022-09-07 DIAGNOSIS — I4891 Unspecified atrial fibrillation: Secondary | ICD-10-CM | POA: Diagnosis not present

## 2022-09-07 DIAGNOSIS — S29012A Strain of muscle and tendon of back wall of thorax, initial encounter: Secondary | ICD-10-CM | POA: Diagnosis not present

## 2022-09-07 DIAGNOSIS — Z5181 Encounter for therapeutic drug level monitoring: Secondary | ICD-10-CM

## 2022-09-07 DIAGNOSIS — Z6837 Body mass index (BMI) 37.0-37.9, adult: Secondary | ICD-10-CM | POA: Diagnosis not present

## 2022-09-07 DIAGNOSIS — R030 Elevated blood-pressure reading, without diagnosis of hypertension: Secondary | ICD-10-CM | POA: Diagnosis not present

## 2022-09-07 LAB — POCT INR: INR: 1.8 — AB (ref 2.0–3.0)

## 2022-09-07 NOTE — Progress Notes (Signed)
Anesthesia Chart Review   Case: [id number] Date/Time: 09/15/22 0845   Procedure: CYSTOSCOPY LEFT URETEROSCOPY/HOLMIUM LASER/STENT PLACEMENT (Left) - 1 HR FOR CASE   Anesthesia type: General   Pre-op diagnosis: LEFT RENAL STONES   Location: [facility]   Surgeons: Irine Seal, MD       DISCUSSION:87 y.o. never smoker with h/o HTN, a-fib, left renal stones scheduled for above procedure 09/15/2022 with Dr. Irine Seal.   Pt seen by cardiology 08/29/2022. Per OV note, "Patient denied any angina or DOE. EKG showed new onset of atrial fibrillation, possibly persistent. Echo from 2018 showed normal LVEF and no valvular abnormalities.  I am repeating her echocardiogram to update the LVEF and any new valvular abnormalities. She is currently on rate controlling agents and systemic anticoagulation is started for her atrial fibrillation. Her palpitation symptoms need to be better controlled prior to the procedure. Otherwise, she is at a low risk for any peri-operative cardiac events for a intermediate risk procedure, cystoscopy. No further cardiac testing is indicated prior to proceeding with the planned procedure.  -Check INR 1 week prior to the procedure. If INR > 2, stop warfarin 5 days prior to the procedure.  If INR < 2, stop warfarin 3 days prior to the procedure. No indication for bridging anticoagulation before the procedure. Resume warfarin with bridging anticoagulation after the procedure whenever stable from bleeding standpoint."  Discussed with Dr. Dellia Cloud, Echo does not need to be completed prior to urologic procedure.   Anticipate pt can proceed with planned procedure barring acute status change.   VS: BP (!) 150/80   Pulse 80   Temp 36.7 C (Oral)   Ht '5\' 6"'$  (1.676 m)   Wt 73.5 kg   SpO2 100%   BMI 26.15 kg/m   PROVIDERS: Sasser, Silvestre Moment, MD is PCP   Cardiologist:  Minus Breeding, MD   LABS: Labs reviewed: Acceptable for surgery. (all labs ordered are listed, but  only abnormal results are displayed)  Labs Reviewed - No data to display   IMAGES:   EKG:   CV: Echo 04/29/2017 Study Conclusions   - Left ventricle: The cavity size was normal. Wall thickness was    increased in a pattern of mild LVH. Systolic function was normal.    The estimated ejection fraction was in the range of 55% to 60%.    Wall motion was normal; there were no regional wall motion    abnormalities. Left ventricular diastolic function parameters    were normal for the patient&'s age.  - Aortic valve: Mildly calcified annulus. Trileaflet.  - Mitral valve: Calcified annulus. There was trivial regurgitation.  - Right atrium: Central venous pressure (est): 3 mm Hg.  - Atrial septum: No defect or patent foramen ovale was identified.  - Tricuspid valve: There was trivial regurgitation.  - Pulmonary arteries: PA peak pressure: 26 mm Hg (S).  - Pericardium, extracardiac: There was no pericardial effusion.  Past Medical History:  Diagnosis Date   Allergic rhinitis    Arthritis    Back ,hips   Cancer ([place])    Complication of anesthesia    awareness during general anesthesia.   Dysrhythmia    A-Fib and PSVT   History of atrial fibrillation    episode 2013 converted with cardizem-- happened at time acute illness due to kidney stone --  resolved  no issues since   History of kidney stones    History of sepsis    urosepsis  2013  due to kidney stone   Hypertension    Left nephrolithiasis    Left ureteral calculus    Neuromuscular disorder (HCC)    PAC (premature atrial contraction)    Solitary lung nodule    benign right middle lobe per CT    Past Surgical History:  Procedure Laterality Date   BACK SURGERY  2015   rod and screws   CATARACT EXTRACTION W/ INTRAOCULAR LENS  IMPLANT, BILATERAL  10/02/2008   CYSTOSCOPY WITH BIOPSY N/A 04/08/2019   Procedure: CYSTOSCOPY WITH bladder BIOPSY, FULGURATION;  Surgeon: Festus Aloe, MD;  Location: [facility];  Service:  Urology;  Laterality: N/A;   CYSTOSCOPY WITH STENT PLACEMENT Left 09/15/2014   Procedure: CYSTOSCOPY WITH STENT PLACEMENT;  Surgeon: Georgette Dover, MD;  Location: [facility];  Service: Urology;  Laterality: Left;   CYSTOSCOPY/RETROGRADE/URETEROSCOPY Left 08/09/2014   Procedure: CYSTOSCOPY/RETROGRADE/LEFT STENT;  Surgeon: Bernestine Amass, MD;  Location: [facility];  Service: Urology;  Laterality: Left;   EXTRACORPOREAL SHOCK WAVE LITHOTRIPSY  10/03/2011   HOLMIUM LASER APPLICATION Left 29/47/6546   Procedure: HOLMIUM LASER APPLICATION;  Surgeon: Georgette Dover, MD;  Location: [facility];  Service: Urology;  Laterality: Left;   IR URETERAL STENT LEFT NEW ACCESS W/O SEP NEPHROSTOMY CATH  08/18/2022   LAPAROTOMY W/  UNILATERAL SALPINGOOPHORECTOMY  age 30   NEPHROLITHOTOMY Left 08/18/2022   Procedure: LEFT NEPHROLITHOTOMY PERCUTANEOUS;  Surgeon: Irine Seal, MD;  Location: [facility];  Service: Urology;  Laterality: Left;  2.5 HRS FOR CASE   TOTAL ABDOMINAL HYSTERECTOMY  age 9   w/ unilateral salpingoophorectomy   TRANSTHORACIC ECHOCARDIOGRAM  02/13/2012   mild LVH/  ef 65%/  trivial MR  &  PR/  mild RVE   URETEROSCOPY Left 09/15/2014   Procedure: URETEROSCOPY;  Surgeon: Georgette Dover, MD;  Location: [facility];  Service: Urology;  Laterality: Left;    MEDICATIONS:  amLODipine (NORVASC) 2.5 MG tablet   fluticasone (FLONASE) 50 MCG/ACT nasal spray   losartan (COZAAR) 50 MG tablet   lubiprostone (AMITIZA) 24 MCG capsule   metoprolol tartrate (LOPRESSOR) 50 MG tablet   ondansetron (ZOFRAN) 4 MG tablet   oxyCODONE-acetaminophen (PERCOCET) 10-325 MG tablet   polyethylene glycol (MIRALAX / GLYCOLAX) 17 g packet   tamsulosin (FLOMAX) 0.4 MG CAPS capsule   trimethoprim (TRIMPEX) 100 MG tablet   vitamin E 180 MG (400 UNITS) capsule   warfarin (COUMADIN) 3 MG tablet   No current facility-administered medications for this encounter.      Konrad Felix Ward, PA-C [facility] [phone]

## 2022-09-07 NOTE — Patient Instructions (Addendum)
Started warfarin '3mg'$  daily on 08/30/22 Increase warfarin to 1 tablet daily except 1.5 tablets on Tuesdays and Fridays. Pending cystoscopy on 09/15/22.  Will hold warfarin 5 days before procedure and resume night of procedure if OK with MD. Starting prednisone taper tonight for shoulder/back pain Recheck 1 week after procedure

## 2022-09-08 ENCOUNTER — Telehealth: Payer: Self-pay

## 2022-09-08 NOTE — Telephone Encounter (Signed)
Patient's daughter ( Rinda) called to verify if there was a change in surgery date and or time.  Please advise.  Call back: [phone]   Thanks, Helene Kelp

## 2022-09-08 NOTE — Telephone Encounter (Signed)
I called and discussed with daughter questions regarding patient.  Daughter discussed with patient and patient wishes to proceed with her surgery that is scheduled. Informed daughter per EPIC surgery is still scheduled for 12/15 and they will keep all scheduled appointments.

## 2022-09-12 ENCOUNTER — Other Ambulatory Visit: Payer: Self-pay | Admitting: Gastroenterology

## 2022-09-12 DIAGNOSIS — T402X5A Adverse effect of other opioids, initial encounter: Secondary | ICD-10-CM

## 2022-09-12 NOTE — Telephone Encounter (Signed)
Phoned the pt's daughter and advised that the medication requested (generic Amitiza) has been sent in to her pharmacy. Call before going to make sure it is ready. Pt's daughter expressed understanding

## 2022-09-14 ENCOUNTER — Encounter: Payer: PPO | Admitting: Urology

## 2022-09-15 ENCOUNTER — Emergency Department (HOSPITAL_COMMUNITY): Payer: PPO

## 2022-09-15 ENCOUNTER — Ambulatory Visit (HOSPITAL_BASED_OUTPATIENT_CLINIC_OR_DEPARTMENT_OTHER): Payer: PPO | Admitting: Anesthesiology

## 2022-09-15 ENCOUNTER — Ambulatory Visit (HOSPITAL_COMMUNITY): Payer: PPO | Admitting: Physician Assistant

## 2022-09-15 ENCOUNTER — Encounter (HOSPITAL_COMMUNITY)
Admission: RE | Disposition: A | Discharge status: Home / Self Care | Payer: Self-pay | Source: Home / Self Care | Attending: Urology

## 2022-09-15 ENCOUNTER — Other Ambulatory Visit: Payer: Self-pay

## 2022-09-15 ENCOUNTER — Encounter (HOSPITAL_COMMUNITY): Payer: Self-pay | Admitting: Urology

## 2022-09-15 ENCOUNTER — Ambulatory Visit: Payer: PPO | Admitting: Internal Medicine

## 2022-09-15 ENCOUNTER — Ambulatory Visit (HOSPITAL_COMMUNITY)
Admission: RE | Admit: 2022-09-15 | Discharge: 2022-09-15 | Disposition: A | Discharge status: Home / Self Care | Payer: PPO | Source: Home / Self Care | Attending: Urology | Admitting: Urology

## 2022-09-15 ENCOUNTER — Encounter (HOSPITAL_COMMUNITY): Payer: Self-pay

## 2022-09-15 ENCOUNTER — Ambulatory Visit (HOSPITAL_COMMUNITY): Payer: PPO

## 2022-09-15 ENCOUNTER — Observation Stay (HOSPITAL_COMMUNITY)
Admission: EM | Admit: 2022-09-15 | Discharge: 2022-09-16 | Disposition: A | Discharge status: Home / Self Care | Payer: PPO | Attending: Family Medicine | Admitting: Family Medicine

## 2022-09-15 DIAGNOSIS — N200 Calculus of kidney: Secondary | ICD-10-CM | POA: Insufficient documentation

## 2022-09-15 DIAGNOSIS — I700 Atherosclerosis of aorta: Secondary | ICD-10-CM | POA: Insufficient documentation

## 2022-09-15 DIAGNOSIS — Z859 Personal history of malignant neoplasm, unspecified: Secondary | ICD-10-CM | POA: Insufficient documentation

## 2022-09-15 DIAGNOSIS — D72819 Decreased white blood cell count, unspecified: Secondary | ICD-10-CM | POA: Diagnosis not present

## 2022-09-15 DIAGNOSIS — E876 Hypokalemia: Secondary | ICD-10-CM | POA: Insufficient documentation

## 2022-09-15 DIAGNOSIS — Z79899 Other long term (current) drug therapy: Secondary | ICD-10-CM | POA: Diagnosis not present

## 2022-09-15 DIAGNOSIS — M7989 Other specified soft tissue disorders: Secondary | ICD-10-CM | POA: Insufficient documentation

## 2022-09-15 DIAGNOSIS — Z7901 Long term (current) use of anticoagulants: Secondary | ICD-10-CM | POA: Insufficient documentation

## 2022-09-15 DIAGNOSIS — I509 Heart failure, unspecified: Secondary | ICD-10-CM | POA: Diagnosis not present

## 2022-09-15 DIAGNOSIS — M1990 Unspecified osteoarthritis, unspecified site: Secondary | ICD-10-CM | POA: Insufficient documentation

## 2022-09-15 DIAGNOSIS — Z87442 Personal history of urinary calculi: Secondary | ICD-10-CM | POA: Insufficient documentation

## 2022-09-15 DIAGNOSIS — N400 Enlarged prostate without lower urinary tract symptoms: Secondary | ICD-10-CM | POA: Insufficient documentation

## 2022-09-15 DIAGNOSIS — I110 Hypertensive heart disease with heart failure: Secondary | ICD-10-CM | POA: Diagnosis not present

## 2022-09-15 DIAGNOSIS — I4891 Unspecified atrial fibrillation: Secondary | ICD-10-CM | POA: Insufficient documentation

## 2022-09-15 DIAGNOSIS — R002 Palpitations: Secondary | ICD-10-CM | POA: Diagnosis not present

## 2022-09-15 DIAGNOSIS — I10 Essential (primary) hypertension: Secondary | ICD-10-CM | POA: Insufficient documentation

## 2022-09-15 DIAGNOSIS — J811 Chronic pulmonary edema: Secondary | ICD-10-CM | POA: Diagnosis not present

## 2022-09-15 DIAGNOSIS — M2550 Pain in unspecified joint: Secondary | ICD-10-CM | POA: Insufficient documentation

## 2022-09-15 DIAGNOSIS — I5030 Unspecified diastolic (congestive) heart failure: Secondary | ICD-10-CM | POA: Diagnosis not present

## 2022-09-15 DIAGNOSIS — I480 Paroxysmal atrial fibrillation: Principal | ICD-10-CM | POA: Diagnosis present

## 2022-09-15 DIAGNOSIS — M549 Dorsalgia, unspecified: Secondary | ICD-10-CM | POA: Insufficient documentation

## 2022-09-15 DIAGNOSIS — R531 Weakness: Secondary | ICD-10-CM | POA: Diagnosis not present

## 2022-09-15 DIAGNOSIS — G8929 Other chronic pain: Secondary | ICD-10-CM | POA: Diagnosis not present

## 2022-09-15 DIAGNOSIS — K5900 Constipation, unspecified: Secondary | ICD-10-CM | POA: Insufficient documentation

## 2022-09-15 DIAGNOSIS — D539 Nutritional anemia, unspecified: Secondary | ICD-10-CM | POA: Insufficient documentation

## 2022-09-15 DIAGNOSIS — R7989 Other specified abnormal findings of blood chemistry: Secondary | ICD-10-CM

## 2022-09-15 DIAGNOSIS — R000 Tachycardia, unspecified: Secondary | ICD-10-CM | POA: Diagnosis not present

## 2022-09-15 DIAGNOSIS — M545 Low back pain, unspecified: Secondary | ICD-10-CM | POA: Diagnosis not present

## 2022-09-15 DIAGNOSIS — J90 Pleural effusion, not elsewhere classified: Secondary | ICD-10-CM | POA: Diagnosis not present

## 2022-09-15 HISTORY — PX: CYSTOSCOPY/URETEROSCOPY/HOLMIUM LASER/STENT PLACEMENT: SHX6546

## 2022-09-15 LAB — CBC
HCT: 40.2 % (ref 36.0–46.0)
Hemoglobin: 13.1 g/dL (ref 12.0–15.0)
MCH: 34 pg (ref 26.0–34.0)
MCHC: 32.6 g/dL (ref 30.0–36.0)
MCV: 104.4 fL — ABNORMAL HIGH (ref 80.0–100.0)
Platelets: 298 10*3/uL (ref 150–400)
RBC: 3.85 MIL/uL — ABNORMAL LOW (ref 3.87–5.11)
RDW: 12.9 % (ref 11.5–15.5)
WBC: 3.7 10*3/uL — ABNORMAL LOW (ref 4.0–10.5)
nRBC: 0 % (ref 0.0–0.2)

## 2022-09-15 LAB — PROTIME-INR
INR: 1.3 — ABNORMAL HIGH (ref 0.8–1.2)
INR: 1.2 (ref 0.8–1.2)
Prothrombin Time: 16 seconds — ABNORMAL HIGH (ref 11.4–15.2)
Prothrombin Time: 14.7 seconds (ref 11.4–15.2)

## 2022-09-15 LAB — MAGNESIUM: Magnesium: 1.8 mg/dL (ref 1.7–2.4)

## 2022-09-15 LAB — BASIC METABOLIC PANEL
Anion gap: 8 (ref 5–15)
BUN: 9 mg/dL (ref 8–23)
CO2: 21 mmol/L — ABNORMAL LOW (ref 22–32)
Calcium: 8.5 mg/dL — ABNORMAL LOW (ref 8.9–10.3)
Chloride: 109 mmol/L (ref 98–111)
Creatinine, Ser: 0.73 mg/dL (ref 0.44–1.00)
GFR, Estimated: >60 mL/min (ref >60)
Glucose, Bld: 176 mg/dL — ABNORMAL HIGH (ref 70–99)
Potassium: 3.3 mmol/L — ABNORMAL LOW (ref 3.5–5.1)
Sodium: 138 mmol/L (ref 135–145)

## 2022-09-15 LAB — BRAIN NATRIURETIC PEPTIDE: B Natriuretic Peptide: 673 pg/mL — ABNORMAL HIGH (ref 0.0–100.0)

## 2022-09-15 LAB — APTT: aPTT: 34 seconds (ref 24–36)

## 2022-09-15 LAB — GLUCOSE, CAPILLARY: Glucose-Capillary: 182 mg/dL — ABNORMAL HIGH (ref 70–99)

## 2022-09-15 LAB — TSH: TSH: 1.165 u[IU]/mL (ref 0.350–4.500)

## 2022-09-15 SURGERY — CYSTOSCOPY/URETEROSCOPY/HOLMIUM LASER/STENT PLACEMENT
Anesthesia: General | Laterality: Left

## 2022-09-15 MED ORDER — CHLORHEXIDINE GLUCONATE 0.12 % MT SOLN
15.0000 mL | Freq: Once | OROMUCOSAL | Status: AC
  Administered 2022-09-15: 15 mL via OROMUCOSAL

## 2022-09-15 MED ORDER — FENTANYL CITRATE PF 50 MCG/ML IJ SOSY: 25.0000 ug | PREFILLED_SYRINGE | INTRAMUSCULAR | Status: DC | PRN

## 2022-09-15 MED ORDER — PROPOFOL 10 MG/ML IV BOLUS
INTRAVENOUS | Status: DC | PRN
  Administered 2022-09-15: 90 mg via INTRAVENOUS

## 2022-09-15 MED ORDER — FENTANYL CITRATE (PF) 100 MCG/2ML IJ SOLN
INTRAMUSCULAR | Status: AC
  Filled 2022-09-15: qty 2

## 2022-09-15 MED ORDER — FUROSEMIDE 10 MG/ML IJ SOLN
40.0000 mg | Freq: Two times a day (BID) | INTRAMUSCULAR | Status: DC
  Administered 2022-09-15 – 2022-09-16 (×2): 40 mg via INTRAVENOUS
  Filled 2022-09-15 (×3): qty 4

## 2022-09-15 MED ORDER — ONDANSETRON HCL 4 MG/2ML IJ SOLN
INTRAMUSCULAR | Status: DC | PRN
  Administered 2022-09-15: 4 mg via INTRAVENOUS

## 2022-09-15 MED ORDER — ORAL CARE MOUTH RINSE: 15.0000 mL | Freq: Once | OROMUCOSAL | Status: AC

## 2022-09-15 MED ORDER — DILTIAZEM LOAD VIA INFUSION
20.0000 mg | Freq: Once | INTRAVENOUS | Status: AC
  Administered 2022-09-15: 20 mg via INTRAVENOUS
  Filled 2022-09-15: qty 20

## 2022-09-15 MED ORDER — DILTIAZEM LOAD VIA INFUSION: 15.0000 mg | Freq: Once | INTRAVENOUS | Status: DC

## 2022-09-15 MED ORDER — FENTANYL CITRATE (PF) 100 MCG/2ML IJ SOLN
INTRAMUSCULAR | Status: DC | PRN
  Administered 2022-09-15 (×4): 12.5 ug via INTRAVENOUS
  Administered 2022-09-15 (×2): 25 ug via INTRAVENOUS

## 2022-09-15 MED ORDER — ONDANSETRON HCL 4 MG PO TABS: 4.0000 mg | ORAL_TABLET | Freq: Four times a day (QID) | ORAL | Status: DC | PRN

## 2022-09-15 MED ORDER — OXYCODONE-ACETAMINOPHEN 5-325 MG PO TABS
1.0000 | ORAL_TABLET | Freq: Four times a day (QID) | ORAL | Status: DC | PRN
  Administered 2022-09-16 (×3): 1 via ORAL
  Filled 2022-09-15 (×3): qty 1

## 2022-09-15 MED ORDER — PHENYLEPHRINE HCL (PRESSORS) 10 MG/ML IV SOLN
INTRAVENOUS | Status: DC | PRN
  Administered 2022-09-15 (×4): 80 ug via INTRAVENOUS

## 2022-09-15 MED ORDER — ACETAMINOPHEN 325 MG PO TABS: 650.0000 mg | ORAL_TABLET | Freq: Four times a day (QID) | ORAL | Status: DC | PRN

## 2022-09-15 MED ORDER — TAMSULOSIN HCL 0.4 MG PO CAPS
0.4000 mg | ORAL_CAPSULE | Freq: Every day | ORAL | Status: DC
  Administered 2022-09-16: 0.4 mg via ORAL
  Filled 2022-09-15: qty 1

## 2022-09-15 MED ORDER — ACETAMINOPHEN 500 MG PO TABS
1000.0000 mg | ORAL_TABLET | Freq: Once | ORAL | Status: AC
  Administered 2022-09-15: 1000 mg via ORAL
  Filled 2022-09-15: qty 2

## 2022-09-15 MED ORDER — 0.9 % SODIUM CHLORIDE (POUR BTL) OPTIME
TOPICAL | Status: DC | PRN
  Administered 2022-09-15: 1000 mL

## 2022-09-15 MED ORDER — SODIUM CHLORIDE 0.9 % IR SOLN
Status: DC | PRN
  Administered 2022-09-15: 3000 mL

## 2022-09-15 MED ORDER — SODIUM CHLORIDE 0.9% FLUSH: 3.0000 mL | Freq: Two times a day (BID) | INTRAVENOUS | Status: DC

## 2022-09-15 MED ORDER — ACETAMINOPHEN 650 MG RE SUPP: 650.0000 mg | Freq: Four times a day (QID) | RECTAL | Status: DC | PRN

## 2022-09-15 MED ORDER — LIDOCAINE HCL (CARDIAC) PF 100 MG/5ML IV SOSY
PREFILLED_SYRINGE | INTRAVENOUS | Status: DC | PRN
  Administered 2022-09-15: 40 mg via INTRAVENOUS

## 2022-09-15 MED ORDER — LACTATED RINGERS IV SOLN: INTRAVENOUS | Status: DC

## 2022-09-15 MED ORDER — DEXAMETHASONE SODIUM PHOSPHATE 10 MG/ML IJ SOLN
INTRAMUSCULAR | Status: AC
  Filled 2022-09-15: qty 1

## 2022-09-15 MED ORDER — PROPOFOL 10 MG/ML IV BOLUS
INTRAVENOUS | Status: AC
  Filled 2022-09-15: qty 20

## 2022-09-15 MED ORDER — LIDOCAINE HCL (PF) 2 % IJ SOLN
INTRAMUSCULAR | Status: AC
  Filled 2022-09-15: qty 5

## 2022-09-15 MED ORDER — OXYCODONE HCL 5 MG PO TABS
5.0000 mg | ORAL_TABLET | Freq: Four times a day (QID) | ORAL | Status: DC | PRN
  Administered 2022-09-16 (×3): 5 mg via ORAL
  Filled 2022-09-15 (×4): qty 1

## 2022-09-15 MED ORDER — METOPROLOL TARTRATE 50 MG PO TABS
50.0000 mg | ORAL_TABLET | Freq: Once | ORAL | Status: AC
  Administered 2022-09-15: 50 mg via ORAL
  Filled 2022-09-15: qty 1

## 2022-09-15 MED ORDER — PHENYLEPHRINE 80 MCG/ML (10ML) SYRINGE FOR IV PUSH (FOR BLOOD PRESSURE SUPPORT)
PREFILLED_SYRINGE | INTRAVENOUS | Status: AC
  Filled 2022-09-15: qty 10

## 2022-09-15 MED ORDER — MAGNESIUM SULFATE 2 GM/50ML IV SOLN
2.0000 g | Freq: Once | INTRAVENOUS | Status: AC
  Administered 2022-09-15: 2 g via INTRAVENOUS
  Filled 2022-09-15: qty 50

## 2022-09-15 MED ORDER — SODIUM CHLORIDE 0.9 % IV SOLN
2.0000 g | INTRAVENOUS | Status: AC
  Administered 2022-09-15: 2 g via INTRAVENOUS
  Filled 2022-09-15: qty 20

## 2022-09-15 MED ORDER — DILTIAZEM HCL-DEXTROSE 125-5 MG/125ML-% IV SOLN (PREMIX): 5.0000 mg/h | INTRAVENOUS | Status: DC

## 2022-09-15 MED ORDER — DEXAMETHASONE SODIUM PHOSPHATE 4 MG/ML IJ SOLN
INTRAMUSCULAR | Status: DC | PRN
  Administered 2022-09-15: 5 mg via INTRAVENOUS

## 2022-09-15 MED ORDER — OXYCODONE-ACETAMINOPHEN 10-325 MG PO TABS: 1.0000 | ORAL_TABLET | Freq: Four times a day (QID) | ORAL | Status: DC | PRN

## 2022-09-15 MED ORDER — ONDANSETRON HCL 4 MG/2ML IJ SOLN
INTRAMUSCULAR | Status: AC
  Filled 2022-09-15: qty 2

## 2022-09-15 MED ORDER — DILTIAZEM HCL-DEXTROSE 125-5 MG/125ML-% IV SOLN (PREMIX)
5.0000 mg/h | INTRAVENOUS | Status: DC
  Administered 2022-09-15: 5 mg/h via INTRAVENOUS
  Filled 2022-09-15: qty 125

## 2022-09-15 MED ORDER — POTASSIUM CHLORIDE CRYS ER 20 MEQ PO TBCR
40.0000 meq | EXTENDED_RELEASE_TABLET | Freq: Once | ORAL | Status: AC
  Administered 2022-09-15: 40 meq via ORAL
  Filled 2022-09-15: qty 2

## 2022-09-15 MED ORDER — OXYCODONE HCL 5 MG PO TABS
10.0000 mg | ORAL_TABLET | ORAL | Status: AC
  Administered 2022-09-15: 10 mg via ORAL
  Filled 2022-09-15: qty 2

## 2022-09-15 MED ORDER — ONDANSETRON HCL 4 MG/2ML IJ SOLN: 4.0000 mg | Freq: Four times a day (QID) | INTRAMUSCULAR | Status: DC | PRN

## 2022-09-15 MED ORDER — LIDOCAINE 5 % EX PTCH
1.0000 | MEDICATED_PATCH | CUTANEOUS | Status: DC
  Administered 2022-09-15 – 2022-09-16 (×2): 1 via TRANSDERMAL
  Filled 2022-09-15 (×3): qty 1

## 2022-09-15 MED ORDER — CHLORHEXIDINE GLUCONATE CLOTH 2 % EX PADS
6.0000 | MEDICATED_PAD | Freq: Every day | CUTANEOUS | Status: DC
  Administered 2022-09-16: 6 via TOPICAL

## 2022-09-15 SURGICAL SUPPLY — 22 items

## 2022-09-15 NOTE — ED Provider Notes (Signed)
[facility] EMERGENCY DEPARTMENT Provider Note   CSN: [id number] Arrival date & time: 09/15/22  1701     History {Add pertinent medical, surgical, social history, OB history to HPI:1} Chief Complaint  Patient presents with   Atrial Fibrillation    Tanya Mueller is a 87 y.o. female.  HTN, a-fib, left renal stones scheduled for above procedure 09/15/2022 with Dr. Irine Seal. Coumadin last 6 days ago. 50 mg metop this morning bid. Do tartrate 50 mg tid. Cardizem prn. Woke up from nap. Palpitations sob felt funny. 156 when daughter checked hr. Sweaty. No CP. Took metop this am. Chronic neck and back pain. Having upper back pain.        Home Medications Prior to Admission medications   Medication Sig Start Date End Date Taking? Authorizing Provider  amLODipine (NORVASC) 2.5 MG tablet Take 1 tablet (2.5 mg total) by mouth daily. 08/29/22 08/24/23  Mallipeddi, Vishnu P, MD  fluticasone (FLONASE) 50 MCG/ACT nasal spray Place 2 sprays into the nose daily as needed for allergies.     [provider]  losartan (COZAAR) 50 MG tablet Take 1 tablet (50 mg total) by mouth daily. 08/29/22 08/24/23  Mallipeddi, Vishnu P, MD  lubiprostone (AMITIZA) 24 MCG capsule TAKE 1 CAPSULE TWICE DAILY WITH A MEAL. 09/12/22   Carlan, Chelsea L, NP  metoprolol tartrate (LOPRESSOR) 50 MG tablet Take 1 tablet (50 mg total) by mouth 2 (two) times daily. 08/29/22 08/24/23  Mallipeddi, Vishnu P, MD  ondansetron (ZOFRAN) 4 MG tablet Take 1 tablet (4 mg total) by mouth every 8 (eight) hours as needed for nausea or vomiting. 04/19/22   Eloise Harman, DO  oxyCODONE-acetaminophen (PERCOCET) 10-325 MG tablet Take 1 tablet by mouth 4 (four) times daily as needed. 08/28/22   [provider]  polyethylene glycol (MIRALAX / GLYCOLAX) 17 g packet Take 17 g by mouth daily as needed for moderate constipation.    [provider]  tamsulosin (FLOMAX) 0.4 MG CAPS capsule Take 1 capsule (0.4 mg total) by  mouth daily after supper. 08/19/22   Vira Agar, MD  trimethoprim (TRIMPEX) 100 MG tablet Take 1 tablet (100 mg total) by mouth at bedtime. Start once Bactrim is completed. Patient not taking: Reported on 08/31/2022 07/11/22   Irine Seal, MD  vitamin E 180 MG (400 UNITS) capsule Take 400 Units by mouth in the morning.    [provider]  warfarin (COUMADIN) 3 MG tablet Take 1 tablet (3 mg total) by mouth daily. Patient taking differently: Take 3 mg by mouth daily at 6 PM. 08/30/22   Mallipeddi, Quenten Raven, MD      Allergies    Patient has no allergy information on record.    Review of Systems   Review of Systems  Physical Exam Updated Vital Signs There were no vitals taken for this visit. Physical Exam  ED Results / Procedures / Treatments   Labs (all labs ordered are listed, but only abnormal results are displayed) Labs Reviewed  BASIC METABOLIC PANEL  MAGNESIUM  CBC  TSH  PROTIME-INR    EKG None  Radiology DG C-Arm 1-60 Min-No Report  Result Date: 09/15/2022 Fluoroscopy was utilized by the requesting physician.  No radiographic interpretation.    Procedures Procedures  {Document cardiac monitor, telemetry assessment procedure when appropriate:1}  Medications Ordered in ED Medications - No data to display  ED Course/ Medical Decision Making/ A&P Clinical Course as of 09/15/22 2017  Fri Sep 15, 2022  1951  Dr Percival Spanish from cards has.  [RP]    Clinical Course User Index [RP] Fransico Meadow, MD                           Medical Decision Making Amount and/or Complexity of Data Reviewed Labs: ordered. Radiology: ordered.  Risk OTC drugs. Prescription drug management. Decision regarding hospitalization.   ***  {Document critical care time when appropriate:1} {Document review of labs and clinical decision tools ie heart score, Chads2Vasc2 etc:1}  {Document your independent review of radiology images, and any outside  records:1} {Document your discussion with family members, caretakers, and with consultants:1} {Document social determinants of health affecting pt's care:1} {Document your decision making why or why not admission, treatments were needed:1} Final Clinical Impression(s) / ED Diagnoses Final diagnoses:  None    Rx / DC Orders ED Discharge Orders          Ordered    Amb referral to [facility]        09/15/22 1726

## 2022-09-15 NOTE — Transfer of Care (Signed)
Immediate Anesthesia Transfer of Care Note  Patient: Tanya Mueller  Procedure(s) Performed: Procedure(s) (LRB): CYSTOSCOPY LEFT URETEROSCOPY/STONE BASKET EXTRACTION (Left)  Patient Location: PACU  Anesthesia Type: General  Level of Consciousness: awake, sedated, patient cooperative and responds to stimulation  Airway & Oxygen Therapy: Patient Spontanous Breathing and Patient connected to face mask oxygen  Post-op Assessment: Report given to PACU RN, Post -op Vital signs reviewed and stable and Patient moving all extremities  Post vital signs: Reviewed and stable  Complications: No apparent anesthesia complications

## 2022-09-15 NOTE — ED Notes (Signed)
Pt's O2 sats at 89%. Placed pt on 2LNC. Pt's O2 now at 93%

## 2022-09-15 NOTE — H&P (Signed)
History and Physical    Patient: Tanya Mueller:[id number] DOB: 06-01-1935 DOA: 09/15/2022 DOS: the patient was seen and examined on 09/15/2022 PCP: Manon Hilding, MD  Patient coming from: Home  Chief Complaint:  Chief Complaint  Patient presents with   Atrial Fibrillation   HPI: Tanya Mueller is a 87 y.o. female with medical history significant of hypertension, atrial fibrillation on Coumadin, BPH who presents to the emergency department via EMS due to increased heart rate in the 150s and shortness of breath. Patient had cystoscopy with removal of left double-J stent and left ureteroscopy with stone extraction this morning with Dr. Irine Seal.  Patient's Coumadin was held 6 days prior to the procedure this morning, but patient continued with her metoprolol with last dose being this morning prior to the procedure.  Patient was discharged home after the procedure, on arrival at home, she took a nap, on waking up, she developed palpitations associated with shortness of breath, HR was checked by daughter and it was noted to be at 156 beats, patient was sweaty, but had no chest pain.  EMS was activated, on arrival of EMS team, 18 mg of Cardizem was given with HR decreasing to 90s to 120s by EMS team, patient was taken to the ED for further evaluation and management.  ED Course:  In the emergency department, she was tachypneic, tachycardic, BP was 163/71, temperature was 97.9 F, O2 sat was 95% on room air.  Workup in the ED showed normal CBC except for leukopenia.  MCV 104.4 BMP showed sodium 138, potassium 3.3, chloride 109, bicarb 21, glucose 176, BUN 9, creatinine 0.73 BNP 673, TSH 1.165, magnesium 1.8 Chest x-ray showed mild cardiomegaly and pulmonary edema. Small bilateral pleural effusions. Patient was treated with Tylenol, oxycodone, Cardizem drip was started.  Cardiology on-call at [facility] was consulted regarding patient's atrial fibrillation with RVR and recommended changing patient's  metoprolol to tartrate and to increase patient's dosing to 50 mg 3 times daily (in place of current twice daily dosing).  Metoprolol 50 mg x 1 was given while patient was still on Cardizem drip and the HR dropped to 84 bpm.  Hospitalist was asked to admit patient for further evaluation and management.   Review of Systems: Review of systems as noted in the HPI. All other systems reviewed and are negative.   Past Medical History:  Diagnosis Date   Allergic rhinitis    Arthritis    Back ,hips   Cancer ([place])    Complication of anesthesia    awareness during general anesthesia.   Dysrhythmia    A-Fib and PSVT   History of atrial fibrillation    episode 2013 converted with cardizem-- happened at time acute illness due to kidney stone --  resolved  no issues since   History of kidney stones    History of sepsis    urosepsis  2013 due to kidney stone   Hypertension    Left nephrolithiasis    Left ureteral calculus    Neuromuscular disorder (HCC)    PAC (premature atrial contraction)    Solitary lung nodule    benign right middle lobe per CT   Past Surgical History:  Procedure Laterality Date   BACK SURGERY  2015   rod and screws   CATARACT EXTRACTION W/ INTRAOCULAR LENS  IMPLANT, BILATERAL  10/02/2008   CYSTOSCOPY WITH BIOPSY N/A 04/08/2019   Procedure: CYSTOSCOPY WITH bladder BIOPSY, FULGURATION;  Surgeon: Festus Aloe, MD;  Location[facility];  Service: Urology;  Laterality: N/A;   CYSTOSCOPY WITH STENT PLACEMENT Left 09/15/2014   Procedure: CYSTOSCOPY WITH STENT PLACEMENT;  Surgeon: Georgette Dover, MD;  Location: [facility];  Service: Urology;  Laterality: Left;   CYSTOSCOPY/RETROGRADE/URETEROSCOPY Left 08/09/2014   Procedure: CYSTOSCOPY/RETROGRADE/LEFT STENT;  Surgeon: Bernestine Amass, MD;  Location[facility];  Service: Urology;  Laterality: Left;   EXTRACORPOREAL SHOCK WAVE LITHOTRIPSY  10/03/2011   HOLMIUM LASER APPLICATION Left 65/12/5463   Procedure:  HOLMIUM LASER APPLICATION;  Surgeon: Georgette Dover, MD;  Location: [facility];  Service: Urology;  Laterality: Left;   IR URETERAL STENT LEFT NEW ACCESS W/O SEP NEPHROSTOMY CATH  08/18/2022   LAPAROTOMY W/  UNILATERAL SALPINGOOPHORECTOMY  age 39   NEPHROLITHOTOMY Left 08/18/2022   Procedure: LEFT NEPHROLITHOTOMY PERCUTANEOUS;  Surgeon: Irine Seal, MD;  Location: [facility];  Service: Urology;  Laterality: Left;  2.5 HRS FOR CASE   TOTAL ABDOMINAL HYSTERECTOMY  age 56   w/ unilateral salpingoophorectomy   TRANSTHORACIC ECHOCARDIOGRAM  02/13/2012   mild LVH/  ef 65%/  trivial MR  &  PR/  mild RVE   URETEROSCOPY Left 09/15/2014   Procedure: URETEROSCOPY;  Surgeon: Georgette Dover, MD;  Location: [facility];  Service: Urology;  Laterality: Left;    Social History:  reports that she has never smoked. She has never been exposed to tobacco smoke. She has never used smokeless tobacco. She reports that she does not drink alcohol and does not use drugs.   Not on File  Family History  Problem Relation Age of Onset   Cancer Other        Lung and bladder   Cancer Other        "Bone marrow cancer"      Prior to Admission medications   Medication Sig Start Date End Date Taking? Authorizing Provider  amLODipine (NORVASC) 2.5 MG tablet Take 1 tablet (2.5 mg total) by mouth daily. 08/29/22 08/24/23  Mallipeddi, Vishnu P, MD  fluticasone (FLONASE) 50 MCG/ACT nasal spray Place 2 sprays into the nose daily as needed for allergies.     [provider]  losartan (COZAAR) 50 MG tablet Take 1 tablet (50 mg total) by mouth daily. 08/29/22 08/24/23  Mallipeddi, Vishnu P, MD  lubiprostone (AMITIZA) 24 MCG capsule TAKE 1 CAPSULE TWICE DAILY WITH A MEAL. 09/12/22   Carlan, Chelsea L, NP  metoprolol tartrate (LOPRESSOR) 50 MG tablet Take 1 tablet (50 mg total) by mouth 2 (two) times daily. 08/29/22 08/24/23  Mallipeddi, Vishnu P, MD  ondansetron (ZOFRAN) 4 MG tablet  Take 1 tablet (4 mg total) by mouth every 8 (eight) hours as needed for nausea or vomiting. 04/19/22   Eloise Harman, DO  oxyCODONE-acetaminophen (PERCOCET) 10-325 MG tablet Take 1 tablet by mouth 4 (four) times daily as needed. 08/28/22   [provider]  polyethylene glycol (MIRALAX / GLYCOLAX) 17 g packet Take 17 g by mouth daily as needed for moderate constipation.    [provider]  tamsulosin (FLOMAX) 0.4 MG CAPS capsule Take 1 capsule (0.4 mg total) by mouth daily after supper. 08/19/22   Vira Agar, MD  trimethoprim (TRIMPEX) 100 MG tablet Take 1 tablet (100 mg total) by mouth at bedtime. Start once Bactrim is completed. Patient not taking: Reported on 08/31/2022 07/11/22   Irine Seal, MD  vitamin E 180 MG (400 UNITS) capsule Take 400 Units by mouth in the morning.    [provider]  warfarin (COUMADIN) 3 MG tablet Take 1 tablet (3 mg total) by mouth daily. Patient taking differently: Take 3 mg by mouth daily at 6 PM. 08/30/22   Mallipeddi, Quenten Raven, MD    Physical Exam: BP (!) 165/117   Pulse 84   Temp 97.9 F (36.6 C) (Axillary)   Resp (!) 22   SpO2 96%   General: 87 y.o. year-old female well developed well nourished in no acute distress.  Alert and oriented x3. HEENT: NCAT, EOMI Neck: Supple, trachea medial Cardiovascular: Irregular rate and rhythm with no rubs or gallops.  No thyromegaly or JVD noted.  No lower extremity edema. 2/4 pulses in all 4 extremities. Respiratory: Clear to auscultation with no wheezes or rales. Good inspiratory effort. Abdomen: Soft, nontender nondistended with normal bowel sounds x4 quadrants. Muskuloskeletal: No cyanosis, clubbing or edema noted bilaterally Neuro: CN II-XII intact, strength 5/5 x 4, sensation, reflexes intact Skin: No ulcerative lesions noted or rashes Psychiatry: Judgement and insight appear normal. Mood is appropriate for condition and setting          Labs on Admission:  Basic Metabolic  Panel: Recent Labs  Lab 09/15/22 1817  NA 138  K 3.3*  CL 109  CO2 21*  GLUCOSE 176*  BUN 9  CREATININE 0.73  CALCIUM 8.5*  MG 1.8   Liver Function Tests: No results for input(s): "AST", "ALT", "ALKPHOS", "BILITOT", "PROT", "ALBUMIN" in the last 168 hours. No results for input(s): "LIPASE", "AMYLASE" in the last 168 hours. No results for input(s): "AMMONIA" in the last 168 hours. CBC: Recent Labs  Lab 09/15/22 1817  WBC 3.7*  HGB 13.1  HCT 40.2  MCV 104.4*  PLT 298   Cardiac Enzymes: No results for input(s): "CKTOTAL", "CKMB", "CKMBINDEX", "TROPONINI" in the last 168 hours.  BNP (last 3 results) Recent Labs    09/15/22 1817  BNP 673.0*    ProBNP (last 3 results) No results for input(s): "PROBNP" in the last 8760 hours.  CBG: No results for input(s): "GLUCAP" in the last 168 hours.  Radiological Exams on Admission: DG Chest Port 1 View  Result Date: 09/15/2022 CLINICAL DATA:  Palpitations EXAM: PORTABLE CHEST 1 VIEW COMPARISON:  Chest x-ray 04/28/2017 FINDINGS: Heart is mildly enlarged. There are increased central interstitial markings. There are small bilateral pleural effusions. No pneumothorax or acute fracture. IMPRESSION: Mild cardiomegaly and pulmonary edema. Small bilateral pleural effusions. Electronically Signed   By: Ronney Asters M.D.   On: 09/15/2022 18:17   DG C-Arm 1-60 Min-No Report  Result Date: 09/15/2022 Fluoroscopy was utilized by the requesting physician.  No radiographic interpretation.    EKG: I independently viewed the EKG done and my findings are as followed: Atrial fibrillation with RVR  Assessment/Plan Present on Admission:  Paroxysmal atrial fibrillation with RVR ([place])  Essential hypertension  Principal Problem:   Paroxysmal atrial fibrillation with RVR (HCC) Active Problems:   Essential hypertension   Nephrolithiasis   Leukopenia   Elevated brain natriuretic peptide (BNP) level   Macrocytic anemia   BPH (benign prostatic  hyperplasia)  Paroxysmal atrial fibrillation with RVR Continue Cardizem drip with plan to transition to home oral meds in the morning Continue telemetry  Elevated BNP rule out acute CHF BNP 673 Chest x-ray showed mild cardiomegaly and pulmonary edema. Small bilateral pleural effusions. Continue total input/output, daily weights and fluid restriction Continue IV Lasix '40mg'$  twice daily Continue heart healthy diet  Echocardiogram in the morning   Leukopenia possibly reactive WBC 3.7, continue to monitor  WBC with morning labs  Hypokalemia K+ 3.3, this will be replenished  Macrocytic anemia MCV 104.4; folate and vitamin B12 levels will be checked  Essential hypertension Continue Cardizem drip, Lasix Consider transitioning to home meds once Cardizem drip is stopped  BPH Continue tamsulosin  History of nephrolithiasis Patient had cystoscopy with removal of left double-J stent and left ureteroscopy with stone extraction this morning with Dr. Irine Seal.  Chronic back pain Continue Percocet as needed per home regimen   DVT prophylaxis: SCDs  Code Status: Full code (confirmed at bedside)  Consults: None  Family Communication: Daughter at bedside  Severity of Illness: The appropriate patient status for this patient is INPATIENT. Inpatient status is judged to be reasonable and necessary in order to provide the required intensity of service to ensure the patient's safety. The patient's presenting symptoms, physical exam findings, and initial radiographic and laboratory data in the context of their chronic comorbidities is felt to place them at high risk for further clinical deterioration. Furthermore, it is not anticipated that the patient will be medically stable for discharge from the hospital within 2 midnights of admission.   * I certify that at the point of admission it is my clinical judgment that the patient will require inpatient hospital care spanning beyond 2 midnights  from the point of admission due to high intensity of service, high risk for further deterioration and high frequency of surveillance required.*  Author: Bernadette Hoit, DO 09/15/2022 9:17 PM  For on call review [email or web address].

## 2022-09-15 NOTE — Anesthesia Preprocedure Evaluation (Addendum)
Anesthesia Evaluation  Patient identified by MRN, date of birth, ID band Patient awake    Reviewed: Allergy & Precautions, NPO status , Patient's Chart, lab work & pertinent test results  Airway Mallampati: I  TM Distance: >3 FB Neck ROM: Full    Dental  (+) Edentulous Upper, Edentulous Lower   Pulmonary     + decreased breath sounds      Cardiovascular hypertension, Pt. on medications and Pt. on home beta blockers + dysrhythmias Atrial Fibrillation  Rhythm:Regular Rate:Normal     Neuro/Psych negative neurological ROS  negative psych ROS   GI/Hepatic negative GI ROS, Neg liver ROS,,,  Endo/Other  negative endocrine ROS    Renal/GU Renal disease     Musculoskeletal  (+) Arthritis ,    Abdominal   Peds  Hematology negative hematology ROS (+)   Anesthesia Other Findings   Reproductive/Obstetrics                             Anesthesia Physical Anesthesia Plan  ASA: 3  Anesthesia Plan: General   Post-op Pain Management:    Induction: Intravenous  PONV Risk Score and Plan: 4 or greater and Ondansetron and Treatment may vary due to age or medical condition  Airway Management Planned: LMA  Additional Equipment: None  Intra-op Plan:   Post-operative Plan: Extubation in OR  Informed Consent: I have reviewed the patients History and Physical, chart, labs and discussed the procedure including the risks, benefits and alternatives for the proposed anesthesia with the patient or authorized representative who has indicated his/her understanding and acceptance.       Plan Discussed with: CRNA  Anesthesia Plan Comments:        Anesthesia Quick Evaluation

## 2022-09-15 NOTE — OR Nursing (Signed)
Stone taken by Dr. Wrenn 

## 2022-09-15 NOTE — ED Notes (Signed)
Provider notified of pt's HR and assessing the need to continue cardizem drip. Awaiting orders

## 2022-09-15 NOTE — Anesthesia Postprocedure Evaluation (Signed)
Anesthesia Post Note  Patient: Tanya Mueller  Procedure(s) Performed: CYSTOSCOPY LEFT URETEROSCOPY/STONE BASKET EXTRACTION (Left)     Patient location during evaluation: PACU Anesthesia Type: General Level of consciousness: awake and alert Pain management: pain level controlled Vital Signs Assessment: post-procedure vital signs reviewed and stable Respiratory status: spontaneous breathing, nonlabored ventilation, respiratory function stable and patient connected to nasal cannula oxygen Cardiovascular status: blood pressure returned to baseline and stable Postop Assessment: no apparent nausea or vomiting Anesthetic complications: no   No notable events documented.  Last Vitals:  Vitals:   09/15/22 1010 09/15/22 1023  BP: 139/75 138/89  Pulse: 80 80  Resp: 12   Temp: (!) 36.3 C   SpO2: 92% 91%    Last Pain:  Vitals:   09/15/22 1023  TempSrc:   PainSc: 0-No pain                 [name]

## 2022-09-15 NOTE — ED Triage Notes (Signed)
Pt arrived via [facility] with c/o of A fib and states her heart was racing at 150 that started today and SOB. 18 mg of cardizem per EMS, pt does have a Hx of a fib. After cardizem, HR was in 90s and up to 120 per EMS

## 2022-09-15 NOTE — Op Note (Signed)
Procedure: 1.  Cystoscopy with removal of left double-J stent. 2.  Left ureteroscopy with stone extraction. 3.  Application of fluoroscopy.  Preop diagnosis: Left renal stones with residual fragments post PCNL and retained stent.  Postop diagnosis: Same.  Surgeon: Dr. Irine Seal.  Anesthesia: General.  Specimens: Stones.  Drains: None.  EBL: None.  Complications: None.  Indications: The patient is an 87 year old female who has a history of a left renal stone that was previously treated with PCNL.  An antegrade stent was left postop.  A postop CT scan demonstrated what appeared to be a 5 mm fragment in the lower pole and was felt that subsequent ureteroscopy with stone removal and probable stent removal were indicated.  Procedure: She was taken operating room where she was given Rocephin.  A general anesthetic was induced.  She was placed in lithotomy position and fitted with PAS hose.  Her perineum and genitalia were prepped with Betadine solution she was draped in usual sterile fashion.  Cystoscopy was performed using a 21 [place] scope and 30 degree lens.  Examination revealed a normal urethra.  There is a stent at the left ureteral orifice.  The bladder wall had mild trabeculation with some changes suggestive of follicular cystitis.  The right ureteral orifice was unremarkable.  The stent was grasped and pulled the urethral meatus and a sensor wire was then advanced through the stent to the kidney under fluoroscopic guidance.  The stent was removed and a 36 cm 11 x 13 French digital access sheath was advanced over the wire under fluoroscopic guidance to the kidney without difficulty.  The inner core and wire were then removed.  The dual-lumen digital flexible scope was then passed the kidney and the collecting system was inspected.  There are a few small stones in the lower calyx as expected but most were merely 1 to 2 mm stones and then there was a small amount of calcific debris which  was likely what gave the stone appearance of being 5 mm on the CT.  Additionally there was a small midpole calyx that was here until papilla.  This was removed using the engage basket.  The remaining small stones in the lower calyx were removed along with the debris as I was able.  A completion of the ureteroscopy no significant stone material remained.  Ureteroscope was removed while inspecting the ureter and some additional calcific debris was removed from the ureter as the scope was removed.  The ureter was widely patent and it was felt the replacement of the stent was not indicated.  After ureteroscope was removed the bladder was drained.  She was taken down for lithotomy position, her anesthetic was reversed and she was moved recovery in stable condition.  Stones were given to the family.  There were no complications.

## 2022-09-15 NOTE — Anesthesia Procedure Notes (Addendum)
Procedure Name: LMA Insertion Date/Time: 09/15/2022 9:09 AM  Performed by: Justice Rocher, CRNAPre-anesthesia Checklist: Patient identified, Emergency Drugs available, Suction available, Patient being monitored and Timeout performed Patient Re-evaluated:Patient Re-evaluated prior to induction Oxygen Delivery Method: Circle system utilized Preoxygenation: Pre-oxygenation with 100% oxygen Induction Type: IV induction Ventilation: Mask ventilation without difficulty LMA: LMA with gastric port inserted LMA Size: 4.0 Number of attempts: 1 Airway Equipment and Method: Bite block Placement Confirmation: positive ETCO2, breath sounds checked- equal and bilateral and CO2 detector Tube secured with: Tape Dental Injury: Teeth and Oropharynx as per pre-operative assessment

## 2022-09-15 NOTE — Discharge Instructions (Addendum)
CYSTOSCOPY HOME CARE INSTRUCTIONS  Activity: Rest for the remainder of the day.  Do not drive or operate equipment today.  You may resume normal activities in one to two days as instructed by your physician.   Meals: Drink plenty of liquids and eat light foods such as gelatin or soup this evening.  You may return to a normal meal plan tomorrow.  Return to Work: You may return to work in one to two days or as instructed by your physician.  Special Instructions / Symptoms: Call your physician if any of these symptoms occur:   -persistent or heavy bleeding  -bleeding which continues after first few urination  -large blood clots that are difficult to pass  -urine stream diminishes or stops completely  -fever equal to or higher than 101 degrees Farenheit.  -cloudy urine with a strong, foul odor  -severe pain  Females should always wipe from front to back after elimination.  You may feel some burning pain when you urinate.  This should disappear with time.  Applying moist heat to the lower abdomen or a hot tub bath may help relieve the pain. \  Please continue the trimethoprim antibiotics at bedtime.   Patient Signature:  ________________________________________________________  Nurse's Signature:  ________________________________________________________

## 2022-09-15 NOTE — Interval H&P Note (Signed)
History and Physical Interval Note:  She has small residual stones follow left PCNL in November and returns today for ureteroscopy and possible stent removal/exchange.    09/15/2022 8:09 AM  Tanya Mueller  has presented today for surgery, with the diagnosis of LEFT RENAL STONES.  The various methods of treatment have been discussed with the patient and family. After consideration of risks, benefits and other options for treatment, the patient has consented to  Procedure(s) with comments: CYSTOSCOPY LEFT URETEROSCOPY/HOLMIUM LASER/STENT PLACEMENT (Left) - 1 HR FOR CASE as a surgical intervention.  The patient's history has been reviewed, patient examined, no change in status, stable for surgery.  I have reviewed the patient's chart and labs.  Questions were answered to the patient's satisfaction.     Irine Seal

## 2022-09-16 ENCOUNTER — Inpatient Hospital Stay (HOSPITAL_BASED_OUTPATIENT_CLINIC_OR_DEPARTMENT_OTHER): Payer: PPO

## 2022-09-16 ENCOUNTER — Encounter (HOSPITAL_COMMUNITY): Payer: Self-pay | Admitting: Urology

## 2022-09-16 DIAGNOSIS — I5021 Acute systolic (congestive) heart failure: Secondary | ICD-10-CM | POA: Diagnosis not present

## 2022-09-16 DIAGNOSIS — I480 Paroxysmal atrial fibrillation: Secondary | ICD-10-CM | POA: Diagnosis not present

## 2022-09-16 DIAGNOSIS — I4891 Unspecified atrial fibrillation: Secondary | ICD-10-CM | POA: Diagnosis present

## 2022-09-16 LAB — COMPREHENSIVE METABOLIC PANEL
ALT: 12 U/L (ref 0–44)
AST: 22 U/L (ref 15–41)
Albumin: 3.4 g/dL — ABNORMAL LOW (ref 3.5–5.0)
Alkaline Phosphatase: 66 U/L (ref 38–126)
Anion gap: 11 (ref 5–15)
BUN: 9 mg/dL (ref 8–23)
CO2: 23 mmol/L (ref 22–32)
Calcium: 8.7 mg/dL — ABNORMAL LOW (ref 8.9–10.3)
Chloride: 103 mmol/L (ref 98–111)
Creatinine, Ser: 0.69 mg/dL (ref 0.44–1.00)
GFR, Estimated: >60 mL/min (ref >60)
Glucose, Bld: 132 mg/dL — ABNORMAL HIGH (ref 70–99)
Potassium: 4 mmol/L (ref 3.5–5.1)
Sodium: 137 mmol/L (ref 135–145)
Total Bilirubin: 0.6 mg/dL (ref 0.3–1.2)
Total Protein: 8.2 g/dL — ABNORMAL HIGH (ref 6.5–8.1)

## 2022-09-16 LAB — CBC
HCT: 40.2 % (ref 36.0–46.0)
Hemoglobin: 13.3 g/dL (ref 12.0–15.0)
MCH: 33.4 pg (ref 26.0–34.0)
MCHC: 33.1 g/dL (ref 30.0–36.0)
MCV: 101 fL — ABNORMAL HIGH (ref 80.0–100.0)
Platelets: 435 10*3/uL — ABNORMAL HIGH (ref 150–400)
RBC: 3.98 MIL/uL (ref 3.87–5.11)
RDW: 12.6 % (ref 11.5–15.5)
WBC: 7.7 10*3/uL (ref 4.0–10.5)
nRBC: 0 % (ref 0.0–0.2)

## 2022-09-16 LAB — PHOSPHORUS: Phosphorus: 3 mg/dL (ref 2.5–4.6)

## 2022-09-16 LAB — MRSA NEXT GEN BY PCR, NASAL: MRSA by PCR Next Gen: NOT DETECTED

## 2022-09-16 LAB — ECHOCARDIOGRAM COMPLETE
Height: 66 in
S' Lateral: 3.3 cm
Weight: 2532.64 oz

## 2022-09-16 LAB — VITAMIN B12: Vitamin B-12: 871 pg/mL (ref 180–914)

## 2022-09-16 LAB — FOLATE: Folate: 18.5 ng/mL (ref >5.9)

## 2022-09-16 LAB — MAGNESIUM: Magnesium: 2.3 mg/dL (ref 1.7–2.4)

## 2022-09-16 MED ORDER — DILTIAZEM HCL 30 MG PO TABS
30.0000 mg | ORAL_TABLET | Freq: Once | ORAL | Status: AC
  Administered 2022-09-16: 30 mg via ORAL
  Filled 2022-09-16: qty 1

## 2022-09-16 MED ORDER — METOPROLOL TARTRATE 50 MG PO TABS
50.0000 mg | ORAL_TABLET | Freq: Two times a day (BID) | ORAL | Status: DC
  Administered 2022-09-16: 50 mg via ORAL
  Filled 2022-09-16: qty 1

## 2022-09-16 MED ORDER — METOPROLOL TARTRATE 50 MG PO TABS: 75.0000 mg | ORAL_TABLET | Freq: Two times a day (BID) | ORAL | 3 refills | Status: DC

## 2022-09-16 MED ORDER — FUROSEMIDE 40 MG PO TABS
40.0000 mg | ORAL_TABLET | Freq: Once | ORAL | Status: AC
  Administered 2022-09-16: 40 mg via ORAL
  Filled 2022-09-16: qty 1

## 2022-09-16 MED ORDER — APIXABAN 5 MG PO TABS: 5.0000 mg | ORAL_TABLET | Freq: Two times a day (BID) | ORAL | 5 refills | Status: DC

## 2022-09-16 MED ORDER — FUROSEMIDE 40 MG PO TABS: 40.0000 mg | ORAL_TABLET | Freq: Every day | ORAL | 0 refills | Status: DC

## 2022-09-16 MED ORDER — METOPROLOL TARTRATE 50 MG PO TABS
50.0000 mg | ORAL_TABLET | Freq: Once | ORAL | Status: AC
  Administered 2022-09-16: 50 mg via ORAL
  Filled 2022-09-16: qty 1

## 2022-09-16 MED ORDER — POTASSIUM CHLORIDE ER 10 MEQ PO TBCR: 10.0000 meq | EXTENDED_RELEASE_TABLET | Freq: Every day | ORAL | 0 refills | Status: DC

## 2022-09-16 MED ORDER — ACETAMINOPHEN 325 MG PO TABS: 650.0000 mg | ORAL_TABLET | Freq: Four times a day (QID) | ORAL | 0 refills | Status: DC | PRN

## 2022-09-16 NOTE — Discharge Instructions (Signed)
1)Avoid ibuprofen/Advil/Aleve/Motrin/Goody Powders/Naproxen/BC powders/Meloxicam/Diclofenac/Indomethacin and other Nonsteroidal anti-inflammatory medications as these will make you more likely to bleed and can cause stomach ulcers, can also cause Kidney problems.   2)Take Eliquis/Apixaban 5 mg twice daily as a blood thinner to reduce your risk for strokes  3)Please take Furosemide/Lasix and potassium as prescribed  4) okay to increase metoprolol to 1 and half tablets (75 mg  from 50 mg) twice daily for better heart rate control  5) follow-up to primary care physician within a week for recheck and reevaluation

## 2022-09-16 NOTE — Progress Notes (Signed)
Echocardiogram 2D Echocardiogram has been performed.  Oneal Deputy Sonja Manseau RDCS 09/16/2022, 11:03 AM

## 2022-09-16 NOTE — Discharge Summary (Signed)
Tanya Mueller, is a 87 y.o. female  DOB [birth year]  MRN [id number].  Admission date:  09/15/2022  Admitting Physician  Bernadette Hoit, DO  Discharge Date:  09/16/2022   Primary MD  Manon Hilding, MD  Recommendations for primary care physician for things to follow:   1)Avoid ibuprofen/Advil/Aleve/Motrin/Goody Powders/Naproxen/BC powders/Meloxicam/Diclofenac/Indomethacin and other Nonsteroidal anti-inflammatory medications as these will make you more likely to bleed and can cause stomach ulcers, can also cause Kidney problems.   2)Take Eliquis/Apixaban 5 mg twice daily as a blood thinner to reduce your risk for strokes  3)Please take Furosemide/Lasix and potassium as prescribed  4) okay to increase metoprolol to 1 and half tablets (75 mg  from 50 mg) twice daily for better heart rate control  5) follow-up to primary care physician within a week for recheck and reevaluation  Admission Diagnosis  Atrial fibrillation with RVR ([place]) [I48.91] Paroxysmal atrial fibrillation with RVR ([place]) [I48.0]   Discharge Diagnosis  Atrial fibrillation with RVR ([place]) [I48.91] Paroxysmal atrial fibrillation with RVR ([place]) [I48.0]    Principal Problem:   Paroxysmal atrial fibrillation with RVR ([facility]) Active Problems:   Essential hypertension   Nephrolithiasis   Leukopenia   Elevated brain natriuretic peptide (BNP) level   Macrocytic anemia   BPH (benign prostatic hyperplasia)      Past Medical History:  Diagnosis Date   Allergic rhinitis    Arthritis    Back ,hips   Cancer ([place])    Complication of anesthesia    awareness during general anesthesia.   Dysrhythmia    A-Fib and PSVT   History of atrial fibrillation    episode 2013 converted with cardizem-- happened at time acute illness due to kidney stone --  resolved  no issues since   History of kidney stones    History of sepsis    urosepsis  2013 due to  kidney stone   Hypertension    Left nephrolithiasis    Left ureteral calculus    Neuromuscular disorder (HCC)    PAC (premature atrial contraction)    Solitary lung nodule    benign right middle lobe per CT    Past Surgical History:  Procedure Laterality Date   BACK SURGERY  2015   rod and screws   CATARACT EXTRACTION W/ INTRAOCULAR LENS  IMPLANT, BILATERAL  10/02/2008   CYSTOSCOPY WITH BIOPSY N/A 04/08/2019   Procedure: CYSTOSCOPY WITH bladder BIOPSY, FULGURATION;  Surgeon: Festus Aloe, MD;  Location: [facility];  Service: Urology;  Laterality: N/A;   CYSTOSCOPY WITH STENT PLACEMENT Left 09/15/2014   Procedure: CYSTOSCOPY WITH STENT PLACEMENT;  Surgeon: Georgette Dover, MD;  Location: [facility];  Service: Urology;  Laterality: Left;   CYSTOSCOPY/RETROGRADE/URETEROSCOPY Left 08/09/2014   Procedure: CYSTOSCOPY/RETROGRADE/LEFT STENT;  Surgeon: Bernestine Amass, MD;  Location: [facility];  Service: Urology;  Laterality: Left;   CYSTOSCOPY/URETEROSCOPY/HOLMIUM LASER/STENT PLACEMENT Left 09/15/2022   Procedure: CYSTOSCOPY LEFT URETEROSCOPY/STONE BASKET EXTRACTION;  Surgeon: Irine Seal, MD;  Location: [facility];  Service: Urology;  Laterality:  Left;  1 HR FOR CASE   EXTRACORPOREAL SHOCK WAVE LITHOTRIPSY  10/03/2011   HOLMIUM LASER APPLICATION Left 70/26/3785   Procedure: HOLMIUM LASER APPLICATION;  Surgeon: Georgette Dover, MD;  Location: [facility];  Service: Urology;  Laterality: Left;   IR URETERAL STENT LEFT NEW ACCESS W/O SEP NEPHROSTOMY CATH  08/18/2022   LAPAROTOMY W/  UNILATERAL SALPINGOOPHORECTOMY  age 89   NEPHROLITHOTOMY Left 08/18/2022   Procedure: LEFT NEPHROLITHOTOMY PERCUTANEOUS;  Surgeon: Irine Seal, MD;  Location: [facility];  Service: Urology;  Laterality: Left;  2.5 HRS FOR CASE   TOTAL ABDOMINAL HYSTERECTOMY  age 28   w/ unilateral salpingoophorectomy   TRANSTHORACIC ECHOCARDIOGRAM  02/13/2012   mild LVH/  ef 65%/  trivial MR  &  PR/  mild  RVE   URETEROSCOPY Left 09/15/2014   Procedure: URETEROSCOPY;  Surgeon: Georgette Dover, MD;  Location: [facility];  Service: Urology;  Laterality: Left;       HPI  from the history and physical done on the day of admission:    HPI: Tanya Mueller is a 87 y.o. female with medical history significant of hypertension, atrial fibrillation on Coumadin, BPH who presents to the emergency department via EMS due to increased heart rate in the 150s and shortness of breath. Patient had cystoscopy with removal of left double-J stent and left ureteroscopy with stone extraction this morning with Dr. Irine Seal.  Patient's Coumadin was held 6 days prior to the procedure this morning, but patient continued with her metoprolol with last dose being this morning prior to the procedure.  Patient was discharged home after the procedure, on arrival at home, she took a nap, on waking up, she developed palpitations associated with shortness of breath, HR was checked by daughter and it was noted to be at 156 beats, patient was sweaty, but had no chest pain.  EMS was activated, on arrival of EMS team, 18 mg of Cardizem was given with HR decreasing to 90s to 120s by EMS team, patient was taken to the ED for further evaluation and management.   ED Course:  In the emergency department, she was tachypneic, tachycardic, BP was 163/71, temperature was 97.9 F, O2 sat was 95% on room air.  Workup in the ED showed normal CBC except for leukopenia.  MCV 104.4 BMP showed sodium 138, potassium 3.3, chloride 109, bicarb 21, glucose 176, BUN 9, creatinine 0.73 BNP 673, TSH 1.165, magnesium 1.8 Chest x-ray showed mild cardiomegaly and pulmonary edema. Small bilateral pleural effusions. Patient was treated with Tylenol, oxycodone, Cardizem drip was started.  Cardiology on-call at [facility] was consulted regarding patient's atrial fibrillation with RVR and recommended changing patient's metoprolol to tartrate and to increase  patient's dosing to 50 mg 3 times daily (in place of current twice daily dosing).  Metoprolol 50 mg x 1 was given while patient was still on Cardizem drip and the HR dropped to 84 bpm.  Hospitalist was asked to admit patient for further evaluation and management.    Review of Systems: Review of systems as noted in the HPI. All other systems reviewed and are negative.   Hospital Course:   Assessment and Plan: 1)Paroxysmal atrial fibrillation with RVR--- triggered by stress of surgical/urological procedure earlier in the day  --Transitioned off IV Cardizem drip -Back on p.o. metoprolol but dose has been increased to 75 mg twice daily from PTA dose of 50 twice daily - CHA2DS2- VASc score   is =  5, (HTN, CHF, age x 2, female gender) Which is  equal to =  7.2 % annual risk of stroke  This patients CHA2DS2-VASc Score and unadjusted Ischemic Stroke Rate (% per year) is equal to 7.2 % stroke rate/year from a score of 5  -After discussing risk versus benefits of anticoagulation patient would like to go with Eliquis rather than Coumadin--- patient takes sulfa type antibiotics from time to time for UTIs and patient is a lot of vegetables worried about interactions with Coumadin -When compared to warfarin , Eliquis appears to have slightly less bleeding risk with good protection against stroke and less concerns about drug drug or drug food interactions  2)HFpEF--- due to #1 above/Tachyarrhythmia induced -BNP 673 -Echo today with EF of 55 to 60% without regional wall motion normalities, mildly dilated left atrium noted no mitral stenosis no aortic stenosis - chest x-ray consistent with pulmonary edema with bilateral pleural effusions =-much Improved with IV Lasix -Continue losartan -Okay to discharge on p.o. Lasix and p.o. potassium  3) hypokalemia--replaced okay to discharge home on potassium while on Lasix-  4) history of nephrolithiasis--cystoscopy with removal of left double-J stent and left  ureteroscopy with stone extraction this morning with Dr. Irine Seal on 09/15/2022 -Continue Flomax to enhance stone passage  5)HTN-stable, consider stopping amlodipine if BP becomes soft -Continue losartan and metoprolol at increased dose as above and Lasix  Discharge Condition: stable  Follow UP--- PCP and cardiologist as advised  Diet and Activity recommendation:  As advised  Discharge Instructions    Discharge Instructions     Amb referral to [facility]IB Clinic   Complete by: As directed    Call MD for:  difficulty breathing, headache or visual disturbances   Complete by: As directed    Call MD for:  persistant dizziness or light-headedness   Complete by: As directed    Call MD for:  persistant nausea and vomiting   Complete by: As directed    Call MD for:  severe uncontrolled pain   Complete by: As directed    Call MD for:  temperature >100.4   Complete by: As directed    Diet - low sodium heart healthy   Complete by: As directed    Discharge instructions   Complete by: As directed    1)Avoid ibuprofen/Advil/Aleve/Motrin/Goody Powders/Naproxen/BC powders/Meloxicam/Diclofenac/Indomethacin and other Nonsteroidal anti-inflammatory medications as these will make you more likely to bleed and can cause stomach ulcers, can also cause Kidney problems.   2)Take Eliquis/Apixaban 5 mg twice daily as a blood thinner to reduce your risk for strokes  3)Please take Furosemide/Lasix and potassium as prescribed  4) okay to increase metoprolol to 1 and half tablets (75 mg  from 50 mg) twice daily for better heart rate control  5) follow-up to primary care physician within a week for recheck and reevaluation   Increase activity slowly   Complete by: As directed          Discharge Medications     Allergies as of 09/16/2022   Not on File      Medication List     STOP taking these medications    trimethoprim 100 MG tablet Commonly known as: TRIMPEX   warfarin 3 MG  tablet Commonly known as: COUMADIN       TAKE these medications    acetaminophen 325 MG tablet Commonly known as: TYLENOL Take 2 tablets (650 mg total) by mouth every 6 (six) hours as needed for mild pain (or Fever >/=  101).   amLODipine 2.5 MG tablet Commonly known as: NORVASC Take 1 tablet (2.5 mg total) by mouth daily.   apixaban 5 MG Tabs tablet Commonly known as: ELIQUIS Take 1 tablet (5 mg total) by mouth 2 (two) times daily.   cyclobenzaprine 10 MG tablet Commonly known as: FLEXERIL Take 10 mg by mouth at bedtime as needed and may repeat dose one time if needed for muscle spasms.   fluticasone 50 MCG/ACT nasal spray Commonly known as: FLONASE Place 2 sprays into the nose daily as needed for allergies.   furosemide 40 MG tablet Commonly known as: LASIX Take 1 tablet (40 mg total) by mouth daily.   losartan 50 MG tablet Commonly known as: COZAAR Take 1 tablet (50 mg total) by mouth daily.   lubiprostone 24 MCG capsule Commonly known as: AMITIZA TAKE 1 CAPSULE TWICE DAILY WITH A MEAL. What changed: See the new instructions.   metoprolol tartrate 50 MG tablet Commonly known as: LOPRESSOR Take 1.5 tablets (75 mg total) by mouth 2 (two) times daily. What changed: how much to take   ondansetron 4 MG tablet Commonly known as: ZOFRAN Take 1 tablet (4 mg total) by mouth every 8 (eight) hours as needed for nausea or vomiting.   oxyCODONE-acetaminophen 10-325 MG tablet Commonly known as: PERCOCET Take 1 tablet by mouth 4 (four) times daily as needed for pain.   polyethylene glycol 17 g packet Commonly known as: MIRALAX / GLYCOLAX Take 17 g by mouth daily as needed for moderate constipation.   potassium chloride 10 MEQ tablet Commonly known as: KLOR-CON Take 1 tablet (10 mEq total) by mouth daily. Take While taking Lasix/furosemide   tamsulosin 0.4 MG Caps capsule Commonly known as: FLOMAX Take 1 capsule (0.4 mg total) by mouth daily after supper.   vitamin  E 180 MG (400 UNITS) capsule Generic drug: vitamin E Take 400 Units by mouth in the morning.        Major procedures and Radiology Reports - PLEASE review detailed and final reports for all details, in brief -   ECHOCARDIOGRAM COMPLETE  Result Date: 09/16/2022    ECHOCARDIOGRAM REPORT   Patient Name:   Tanya Mueller Date of Exam: 09/16/2022 Medical Rec #:  [id number]     Height:       66.0 in Accession #:    [id number]    Weight:       158.3 lb Date of Birth:  02-01-1935     BSA:          1.811 m Patient Age:    74 years      BP:           166/98 mmHg Patient Gender: F             HR:           86 bpm. Exam Location:  [facility] Procedure: 2D Echo, Color Doppler and Cardiac Doppler Indications:    W25.85 Acute systolic (congestive) heart failure  History:        Patient has prior history of Echocardiogram examinations, most                 recent 04/29/2017. Arrythmias:Atrial Fibrillation; Risk                 Factors:Hypertension.  Sonographer:    Raquel Sarna Senior RDCS Referring Phys: [id number] OLADAPO ADEFESO  Sonographer Comments: Suboptimal parasternal window IMPRESSIONS  1. Left ventricular ejection fraction, by estimation, is 55 to 60%.  The left ventricle has normal function. The left ventricle has no regional wall motion abnormalities. Left ventricular diastolic parameters are indeterminate.  2. Right ventricular systolic function is normal. The right ventricular size is moderately enlarged.  3. Left atrial size was mildly dilated.  4. The mitral valve is normal in structure. Mild mitral valve regurgitation. No evidence of mitral stenosis.  5. The aortic valve was not well visualized. Aortic valve regurgitation is not visualized. No aortic stenosis is present.  6. The inferior vena cava is normal in size with <50% respiratory variability, suggesting right atrial pressure of 8 mmHg. Comparison(s): Similar to 2018 Report. FINDINGS  Left Ventricle: Left ventricular ejection fraction, by estimation, is 55 to  60%. The left ventricle has normal function. The left ventricle has no regional wall motion abnormalities. The left ventricular internal cavity size was normal in size. There is  no left ventricular hypertrophy. Left ventricular diastolic parameters are indeterminate. Right Ventricle: The right ventricular size is moderately enlarged. No increase in right ventricular wall thickness. Right ventricular systolic function is normal. Left Atrium: Left atrial size was mildly dilated. Right Atrium: Right atrial size was normal in size. Pericardium: There is no evidence of pericardial effusion. Mitral Valve: The mitral valve is normal in structure. Mild mitral valve regurgitation. No evidence of mitral valve stenosis. Tricuspid Valve: The tricuspid valve is not well visualized. Tricuspid valve regurgitation is trivial. No evidence of tricuspid stenosis. Aortic Valve: The aortic valve was not well visualized. Aortic valve regurgitation is not visualized. No aortic stenosis is present. Pulmonic Valve: The pulmonic valve was not well visualized. Pulmonic valve regurgitation is not visualized. Aorta: The ascending aorta was not well visualized and the aortic root is normal in size and structure. Venous: The inferior vena cava is normal in size with less than 50% respiratory variability, suggesting right atrial pressure of 8 mmHg. IAS/Shunts: No atrial level shunt detected by color flow Doppler.  LEFT VENTRICLE PLAX 2D LVIDd:         4.30 cm LVIDs:         3.30 cm LV PW:         0.90 cm LV IVS:        0.80 cm LVOT diam:     1.90 cm LV SV:         40 LV SV Index:   22 LVOT Area:     2.84 cm  RIGHT VENTRICLE RV S prime:     11.00 cm/s TAPSE (M-mode): 1.8 cm LEFT ATRIUM             Index        RIGHT ATRIUM           Index LA diam:        4.60 cm 2.54 cm/m   RA Area:     18.60 cm LA Vol (A2C):   61.4 ml 33.91 ml/m  RA Volume:   50.60 ml  27.95 ml/m LA Vol (A4C):   69.5 ml 38.39 ml/m LA Biplane Vol: 64.4 ml 35.57 ml/m  AORTIC  VALVE LVOT Vmax:   71.80 cm/s LVOT Vmean:  54.200 cm/s LVOT VTI:    0.140 m  AORTA Ao Root diam: 3.10 cm  SHUNTS Systemic VTI:  0.14 m Systemic Diam: 1.90 cm Rudean Haskell MD Electronically signed by Rudean Haskell MD Signature Date/Time: 09/16/2022/1:09:51 PM    Final    DG Chest Port 1 View  Result Date: 09/15/2022 CLINICAL DATA:  Palpitations EXAM: PORTABLE CHEST 1  VIEW COMPARISON:  Chest x-ray 04/28/2017 FINDINGS: Heart is mildly enlarged. There are increased central interstitial markings. There are small bilateral pleural effusions. No pneumothorax or acute fracture. IMPRESSION: Mild cardiomegaly and pulmonary edema. Small bilateral pleural effusions. Electronically Signed   By: Ronney Asters M.D.   On: 09/15/2022 18:17   DG C-Arm 1-60 Min-No Report  Result Date: 09/15/2022 Fluoroscopy was utilized by the requesting physician.  No radiographic interpretation.   CT ABDOMEN PELVIS WO CONTRAST  Result Date: 08/19/2022 CLINICAL DATA:  Evaluate stone burden status post intervention. EXAM: CT ABDOMEN AND PELVIS WITHOUT CONTRAST TECHNIQUE: Multidetector CT imaging of the abdomen and pelvis was performed following the standard protocol without IV contrast. RADIATION DOSE REDUCTION: This exam was performed according to the departmental dose-optimization program which includes automated exposure control, adjustment of the mA and/or kV according to patient size and/or use of iterative reconstruction technique. COMPARISON:  CT 11/08/2021 FINDINGS: Lower chest: Linear scarring at the lung bases. Hepatobiliary: No focal hepatic lesion. Gallbladder normal. No biliary duct dilatation. Common bile duct is normal. Pancreas: Pancreas is normal. No ductal dilatation. No pancreatic inflammation. Spleen: Normal spleen Adrenals/urinary tract: Adrenal glands normal. LEFT percutaneous nephrostomy tube with tip in the LEFT renal hilum. LEFT double-J ureteral stent appropriate position. Previous seen large  calculus in the LEFT renal hilum has been extracted. 3 mm calculus is present in the LEFT lower pole kidney (image 29/2). Smaller punctate calculus in the upper pole LEFT kidney (image 19/2). No calculi identified along the course of the ureter. No bladder calculi. Several small 3 mm calculi in the RIGHT kidney. No RIGHT ureterolithiasis. Stomach/Bowel: Stomach, small bowel, appendix, and cecum are normal. The colon and rectosigmoid colon are normal. Vascular/Lymphatic: Abdominal aorta is normal caliber with atherosclerotic calcification. There is no retroperitoneal or periportal lymphadenopathy. No pelvic lymphadenopathy. Reproductive: Post hysterectomy Other: No free fluid. Musculoskeletal: No aggressive osseous lesion. IMPRESSION: 1. Near complete extraction of the large LEFT renal calculus. 2. No calculi identified along the course of the LEFT ureter. Electronically Signed   By: Suzy Bouchard M.D.   On: 08/19/2022 12:01   DG C-Arm 1-60 Min-No Report  Result Date: 08/18/2022 Fluoroscopy was utilized by the requesting physician.  No radiographic interpretation.   IR URETERAL STENT LEFT NEW ACCESS W/O SEP NEPHROSTOMY CATH  Result Date: 08/18/2022 INDICATION: Large left renal pelvis calculus EXAM: ULTRASOUND FLUOROSCOPIC LEFT NEPHROSTOMY ACCESS FOR OPERATIVE NEPHROLITHOTOMY COMPARISON:  11/08/2021 CT MEDICATIONS: 2 G ROCEPHIN; The antibiotic was administered in an appropriate time frame prior to skin puncture. ANESTHESIA/SEDATION: Moderate (conscious) sedation was employed during this procedure. A total of Versed 1.5 mg and Fentanyl 70 mcg was administered intravenously by the radiology nurse. Total intra-service moderate Sedation Time: 35 minutes. The patient's level of consciousness and vital signs were monitored continuously by radiology nursing throughout the procedure under my direct supervision. CONTRAST:  10 CC-administered into the collecting system(s) FLUOROSCOPY: Radiation Exposure Index (as  provided by the fluoroscopic device): 30 mGy Kerma COMPLICATIONS: None immediate. PROCEDURE: Informed written consent was obtained from the patient after a thorough discussion of the procedural risks, benefits and alternatives. All questions were addressed. Maximal Sterile Barrier Technique was utilized including caps, mask, sterile gowns, sterile gloves, sterile drape, hand hygiene and skin antiseptic. A timeout was performed prior to the initiation of the procedure. previous imaging reviewed. patient position prone. preliminary ultrasound performed. the left kidney lower pole posterior calyx containing an echogenic calculus was localized and marked. under sterile conditions and local anesthesia, an  18 gauge 15 cm access was advanced percutaneously under ultrasound to the lower pole posterior calyx containing a stone. stone was felt on the needle tip. there was return of trace urine. contrast injection confirms position within the calyx. bentson guidewire inserted which was coiled in the renal pelvis adjacent to the large stone. five french catheter advanced. catheter guidewire access manipulated past the stone into the ureter and advanced into the bladder. contrast injection confirms position in the bladder. images obtained for documentation. access secured with 0 silk suture. external caps and a sterile dressing applied. patient tolerated the procedure well. no immediate complication. IMPRESSION: Successful left percutaneous nephrostomy catheter placement with guidewire access past the stone into the bladder. Electronically Signed   By: Jerilynn Mages.  Shick M.D.   On: 08/18/2022 10:20    Micro Results   Recent Results (from the past 240 hour(s))  MRSA Next Gen by PCR, Nasal     Status: None   Collection Time: 09/15/22 11:16 PM   Specimen: Nasal Mucosa; Nasal Swab  Result Value Ref Range Status   MRSA by PCR Next Gen NOT DETECTED NOT DETECTED Final    Comment: (NOTE) The GeneXpert MRSA Assay (FDA approved for  NASAL specimens only), is one component of a comprehensive MRSA colonization surveillance program. It is not intended to diagnose MRSA infection nor to guide or monitor treatment for MRSA infections. Test performance is not FDA approved in patients less than 65 years old. Performed at [facility], [address], [place], [place] [address]    Today   Subjective    Tanya Mueller today has no new complaints      No fever  Or chills   No Nausea, Vomiting or Diarrhea  Daughter Rinda at bedside, questions answered -Ambulated around the unit couple times no significant dyspnea on exertion no hypoxia no chest pains no palpitations no dizziness -Doing well       Patient has been seen and examined prior to discharge   Objective   Blood pressure (!) 146/120, pulse 98, temperature 98.2 F (36.8 C), temperature source Oral, resp. rate 18, height '5\' 6"'$  (1.676 m), weight 71.8 kg, SpO2 97 %.   Intake/Output Summary (Last 24 hours) at 09/16/2022 1810 Last data filed at 09/16/2022 1322 Gross per 24 hour  Intake 107.08 ml  Output 1200 ml  Net -1092.92 ml    Exam Gen:- Awake Alert, no acute distress  HEENT:- [place].AT, No sclera icterus Neck-Supple Neck,No JVD,.  Lungs-improved air movement, no wheezing or rales  CV- S1, S2 normal, irregularly irregular abd-  +ve B.Sounds, Abd Soft, No tenderness,    Extremity/Skin:- No  edema,   good pulses Psych-affect is appropriate, oriented x3 Neuro-no new focal deficits, no tremors    Data Review   CBC w Diff:  Lab Results  Component Value Date   WBC 7.7 09/16/2022   HGB 13.3 09/16/2022   HCT 40.2 09/16/2022   PLT 435 (H) 09/16/2022   LYMPHOPCT 28 08/18/2022   MONOPCT 11 08/18/2022   EOSPCT 7 08/18/2022   BASOPCT 1 08/18/2022    CMP:  Lab Results  Component Value Date   NA 137 09/16/2022   K 4.0 09/16/2022   CL 103 09/16/2022   CO2 23 09/16/2022   BUN 9 09/16/2022   CREATININE 0.69 09/16/2022   PROT 8.2 (H) 09/16/2022   ALBUMIN  3.4 (L) 09/16/2022   BILITOT 0.6 09/16/2022   ALKPHOS 66 09/16/2022   AST 22 09/16/2022   ALT 12 09/16/2022  .  Total Discharge time is about 33 minutes  Roxan Hockey M.D on 09/16/2022 at 6:10 PM  Go to [email or web address] -  for contact info  [facility] - Office  [phone]

## 2022-09-16 NOTE — TOC Progression Note (Signed)
  Transition of Care (TOC) Screening Note   Patient Details  Name: Tanya Mueller Date of Birth: 10-13-1934   Transition of Care [facility]) CM/SW Contact:    Boneta Lucks, RN Phone Number: 09/16/2022, 12:23 PM  In AFIB - workup - today to follow  Transition of Care Department [facility]) has reviewed patient and no TOC needs have been identified at this time. We will continue to monitor patient advancement through interdisciplinary progression rounds. If new patient transition needs arise, please place a TOC consult.      Barriers to Discharge: Continued Medical Work up  Expected Discharge Plan and Services        Living arrangements for the past 2 months: [place]

## 2022-09-18 ENCOUNTER — Other Ambulatory Visit: Payer: PPO

## 2022-09-18 ENCOUNTER — Telehealth: Payer: Self-pay

## 2022-09-18 DIAGNOSIS — I4891 Unspecified atrial fibrillation: Secondary | ICD-10-CM | POA: Diagnosis not present

## 2022-09-18 DIAGNOSIS — M545 Low back pain, unspecified: Secondary | ICD-10-CM | POA: Diagnosis not present

## 2022-09-18 DIAGNOSIS — N200 Calculus of kidney: Secondary | ICD-10-CM | POA: Diagnosis not present

## 2022-09-18 DIAGNOSIS — I10 Essential (primary) hypertension: Secondary | ICD-10-CM | POA: Diagnosis not present

## 2022-09-18 DIAGNOSIS — Z6837 Body mass index (BMI) 37.0-37.9, adult: Secondary | ICD-10-CM | POA: Diagnosis not present

## 2022-09-18 NOTE — Telephone Encounter (Signed)
Patient's daughter called in today concern about whether her mom need to continued taking tamsulosin 0.'4mg'$ . Made daughter aware that I will send a message to Dr. Jeffie Pollock to see if he will refill medication and someone will reach back out to her once Dr. Jeffie Pollock responses. Daughter voiced understanding.

## 2022-09-18 NOTE — Telephone Encounter (Signed)
Made daughter aware that patient can stop tamsulosin per Dr. Jeffie Pollock. Daughter voiced understanding

## 2022-09-21 ENCOUNTER — Ambulatory Visit (INDEPENDENT_AMBULATORY_CARE_PROVIDER_SITE_OTHER): Payer: PPO | Admitting: Urology

## 2022-09-21 VITALS — BP 131/72 | HR 76

## 2022-09-21 DIAGNOSIS — N200 Calculus of kidney: Secondary | ICD-10-CM

## 2022-09-21 DIAGNOSIS — N3020 Other chronic cystitis without hematuria: Secondary | ICD-10-CM

## 2022-09-21 NOTE — Progress Notes (Signed)
Subjective: 1. Chronic cystitis   2. Renal stones      09/21/22: Tanya Mueller returns today in f/u.  She had a left PCNL and subsequent left URS and the stent has been removed.  She has some mild back pain.  She has no dysuria.  She had some increased nocturia last night.  Her UA has 11-30 RBC's. She had to come back to the hospital with a-fib on the evening of her last procedure on 09/15/22 and her HR remains variable.    Gu hx; Tanya Mueller is an 87 yo female who is a former patient of Dr. Junious Silk in [place].  She had follicular cystitis on a biopsy in 7/20 and has a history of stones with multiple prior procedures.  She has a history of OAB and has failed vesicare and myrbetriq.   She last had a CT in in 2/23 and had a 2.3cm laminated stone in the left renal pelvis and smaller right renal stones.  There was a 21m stone in the LLP on a CT in 2020.   She has a chronic UTI that are becoming more frequency and she will generally benefit from a course of antibiotics.  The last oral antibiotic didn't help and needed a injection. She was in the ER on 05/05/22 with constipation.  A culture that day was negative.  She currently complains of malodorous urine over the last 2 weeks with malaise.  She has no dysuria.   She has some frequency and urgency.  She has not had recent hematuria.   She has had multilevel back surgery with fusion and her back pain gets worse with the infections, left > right.  Her UA looks infected.    ROS:  Review of Systems  Cardiovascular:  Positive for leg swelling.  Gastrointestinal:  Positive for constipation.  Genitourinary:  Positive for frequency and urgency.  Musculoskeletal:  Positive for back pain and joint pain.  All other systems reviewed and are negative.   Not on File  Past Medical History:  Diagnosis Date   Allergic rhinitis    Arthritis    Back ,hips   Cancer ([place]    Complication of anesthesia    awareness during general anesthesia.   Dysrhythmia    A-Fib and  PSVT   History of atrial fibrillation    episode 2013 converted with cardizem-- happened at time acute illness due to kidney stone --  resolved  no issues since   History of kidney stones    History of sepsis    urosepsis  2013 due to kidney stone   Hypertension    Left nephrolithiasis    Left ureteral calculus    Neuromuscular disorder (HCC)    PAC (premature atrial contraction)    Solitary lung nodule    benign right middle lobe per CT    Past Surgical History:  Procedure Laterality Date   BACK SURGERY  2015   rod and screws   CATARACT EXTRACTION W/ INTRAOCULAR LENS  IMPLANT, BILATERAL  10/02/2008   CYSTOSCOPY WITH BIOPSY N/A 04/08/2019   Procedure: CYSTOSCOPY WITH bladder BIOPSY, FULGURATION;  Surgeon: EFestus Aloe MD;  Location: [facility];  Service: Urology;  Laterality: N/A;   CYSTOSCOPY WITH STENT PLACEMENT Left 09/15/2014   Procedure: CYSTOSCOPY WITH STENT PLACEMENT;  Surgeon: MGeorgette Dover MD;  [facility]  Service: Urology;  Laterality: Left;   CYSTOSCOPY/RETROGRADE/URETEROSCOPY Left 08/09/2014   Procedure: CYSTOSCOPY/RETROGRADE/LEFT STENT;  Surgeon: DBernestine Amass MD;  Location: [facility];  Service: Urology;  Laterality: Left;   CYSTOSCOPY/URETEROSCOPY/HOLMIUM LASER/STENT PLACEMENT Left 09/15/2022   Procedure: CYSTOSCOPY LEFT URETEROSCOPY/STONE BASKET EXTRACTION;  Surgeon: Irine Seal, MD;  Location: [facility];  Service: Urology;  Laterality: Left;  1 HR FOR CASE   EXTRACORPOREAL SHOCK WAVE LITHOTRIPSY  10/03/2011   HOLMIUM LASER APPLICATION Left 07/29/2535   Procedure: HOLMIUM LASER APPLICATION;  Surgeon: Georgette Dover, MD;  Location: [facility];  Service: Urology;  Laterality: Left;   IR URETERAL STENT LEFT NEW ACCESS W/O SEP NEPHROSTOMY CATH  08/18/2022   LAPAROTOMY W/  UNILATERAL SALPINGOOPHORECTOMY  age 16   NEPHROLITHOTOMY Left 08/18/2022   Procedure: LEFT NEPHROLITHOTOMY PERCUTANEOUS;  Surgeon: Irine Seal, MD;   Location: [facility];  Service: Urology;  Laterality: Left;  2.5 HRS FOR CASE   TOTAL ABDOMINAL HYSTERECTOMY  age 52   w/ unilateral salpingoophorectomy   TRANSTHORACIC ECHOCARDIOGRAM  02/13/2012   mild LVH/  ef 65%/  trivial MR  &  PR/  mild RVE   URETEROSCOPY Left 09/15/2014   Procedure: URETEROSCOPY;  Surgeon: Georgette Dover, MD;  Location: [facility];  Service: Urology;  Laterality: Left;    Social History   Socioeconomic History   Marital status: Married    Spouse name: Not on file   Number of children: 1   Years of education: Not on file   Highest education level: Not on file  Occupational History    Employer: RETIRED  Tobacco Use   Smoking status: Never    Passive exposure: Never   Smokeless tobacco: Never  Vaping Use   Vaping Use: Never used  Substance and Sexual Activity   Alcohol use: No   Drug use: No   Sexual activity: Not on file  Other Topics Concern   Not on file  Social History Narrative   Not on file   Social Determinants of Health   Financial Resource Strain: Not on file  Food Insecurity: No Food Insecurity (09/16/2022)   Hunger Vital Sign    Worried About Running Out of Food in the Last Year: Never true    Ran Out of Food in the Last Year: Never true  Transportation Needs: No Transportation Needs (09/16/2022)   PRAPARE - Hydrologist (Medical): No    Lack of Transportation (Non-Medical): No  Physical Activity: Not on file  Stress: Not on file  Social Connections: Not on file  Intimate Partner Violence: Not At Risk (09/16/2022)   Humiliation, Afraid, Rape, and Kick questionnaire    Fear of Current or Ex-Partner: No    Emotionally Abused: No    Physically Abused: No    Sexually Abused: No    Family History  Problem Relation Age of Onset   Cancer Other        Lung and bladder   Cancer Other        "Bone marrow cancer"    Anti-infectives: Anti-infectives (From admission, onward)    None        Current Outpatient Medications  Medication Sig Dispense Refill   acetaminophen (TYLENOL) 325 MG tablet Take 2 tablets (650 mg total) by mouth every 6 (six) hours as needed for mild pain (or Fever >/= 101). 30 tablet 0   amLODipine (NORVASC) 2.5 MG tablet Take 1 tablet (2.5 mg total) by mouth daily. 90 tablet 3   apixaban (ELIQUIS) 5 MG TABS tablet Take 1 tablet (5 mg total) by mouth 2 (two) times daily. 60 tablet 5  cyclobenzaprine (FLEXERIL) 10 MG tablet Take 10 mg by mouth at bedtime as needed and may repeat dose one time if needed for muscle spasms.     fluticasone (FLONASE) 50 MCG/ACT nasal spray Place 2 sprays into the nose daily as needed for allergies.      furosemide (LASIX) 40 MG tablet Take 1 tablet (40 mg total) by mouth daily. 30 tablet 0   losartan (COZAAR) 50 MG tablet Take 1 tablet (50 mg total) by mouth daily. 90 tablet 3   lubiprostone (AMITIZA) 24 MCG capsule TAKE 1 CAPSULE TWICE DAILY WITH A MEAL. (Patient taking differently: Take 24 mcg by mouth 2 (two) times daily with a meal.) 60 capsule 0   metoprolol tartrate (LOPRESSOR) 50 MG tablet Take 1.5 tablets (75 mg total) by mouth 2 (two) times daily. 270 tablet 3   ondansetron (ZOFRAN) 4 MG tablet Take 1 tablet (4 mg total) by mouth every 8 (eight) hours as needed for nausea or vomiting. 30 tablet 2   oxyCODONE-acetaminophen (PERCOCET) 10-325 MG tablet Take 1 tablet by mouth 4 (four) times daily as needed for pain.     polyethylene glycol (MIRALAX / GLYCOLAX) 17 g packet Take 17 g by mouth daily as needed for moderate constipation.     potassium chloride (KLOR-CON) 10 MEQ tablet Take 1 tablet (10 mEq total) by mouth daily. Take While taking Lasix/furosemide 30 tablet 0   vitamin E 180 MG (400 UNITS) capsule Take 400 Units by mouth in the morning.     No current facility-administered medications for this visit.     Objective: Vital signs in last 24 hours: BP 131/72   Pulse 76   Intake/Output from previous day: No  intake/output data recorded. Intake/Output this shift: '@IOTHISSHIFT'$ @   Physical Exam Vitals reviewed.  Constitutional:      Appearance: Normal appearance.  Abdominal:     Tenderness: There is no right CVA tenderness or left CVA tenderness.  Neurological:     Mental Status: She is alert.     Lab Results:  Results for orders placed or performed in visit on 09/21/22 (from the past 24 hour(s))  Urinalysis, Routine w reflex microscopic     Status: Abnormal   Collection Time: 09/21/22  1:16 PM  Result Value Ref Range   Specific Gravity, UA 1.010 1.005 - 1.030   pH, UA 6.0 5.0 - 7.5   Color, UA Yellow Yellow   Appearance Ur Clear Clear   Leukocytes,UA Negative Negative   Protein,UA Negative Negative/Trace   Glucose, UA Negative Negative   Ketones, UA Negative Negative   RBC, UA 2+ (A) Negative   Bilirubin, UA Negative Negative   Urobilinogen, Ur 0.2 0.2 - 1.0 mg/dL   Nitrite, UA Negative Negative   Microscopic Examination See below:    Narrative   Performed at:  [facility] [address] [place], [place]  [id number] Lab Director: Mina Marble MT, Phone:  [phone]  Microscopic Examination     Status: Abnormal   Collection Time: 09/21/22  1:16 PM   Urine  Result Value Ref Range   WBC, UA 0-5 0 - 5 /hpf   RBC, Urine 11-30 (A) 0 - 2 /hpf   Epithelial Cells (non renal) 0-10 0 - 10 /hpf   Bacteria, UA None seen None seen/Few   Narrative   Performed at:  [facility] [address], [place], [place]  [id number] Lab Director: [place], Phone:  [phone]  BMET No results for input(s): "NA", "K", "CL", "CO2", "GLUCOSE", "BUN", "CREATININE", "CALCIUM" in the last 72 hours. PT/INR No results for input(s): "LABPROT", "INR" in the last 72 hours. ABG No results for input(s): "PHART", "HCO3" in the last 72 hours.  Invalid input(s): "PCO2", "PO2" UA has 11-30 RBC's.  Studies/Results: No results found. ECHOCARDIOGRAM  COMPLETE  Result Date: 09/16/2022    ECHOCARDIOGRAM REPORT   Patient Name:   DENETTE HASS Date of Exam: 09/16/2022 Medical Rec #:  [id number]     Height:       66.0 in Accession #:    [id number]    Weight:       158.3 lb Date of Birth:  14-May-1935     BSA:          1.811 m Patient Age:    18 years      BP:           166/98 mmHg Patient Gender: F             HR:           86 bpm. Exam Location:  [facility] Procedure: 2D Echo, Color Doppler and Cardiac Doppler Indications:    Q03.47 Acute systolic (congestive) heart failure  History:        Patient has prior history of Echocardiogram examinations, most                 recent 04/29/2017. Arrythmias:Atrial Fibrillation; Risk                 Factors:Hypertension.  Sonographer:    Raquel Sarna Senior RDCS Referring Phys: [id number] OLADAPO ADEFESO  Sonographer Comments: Suboptimal parasternal window IMPRESSIONS  1. Left ventricular ejection fraction, by estimation, is 55 to 60%. The left ventricle has normal function. The left ventricle has no regional wall motion abnormalities. Left ventricular diastolic parameters are indeterminate.  2. Right ventricular systolic function is normal. The right ventricular size is moderately enlarged.  3. Left atrial size was mildly dilated.  4. The mitral valve is normal in structure. Mild mitral valve regurgitation. No evidence of mitral stenosis.  5. The aortic valve was not well visualized. Aortic valve regurgitation is not visualized. No aortic stenosis is present.  6. The inferior vena cava is normal in size with <50% respiratory variability, suggesting right atrial pressure of 8 mmHg. Comparison(s): Similar to 2018 Report. FINDINGS  Left Ventricle: Left ventricular ejection fraction, by estimation, is 55 to 60%. The left ventricle has normal function. The left ventricle has no regional wall motion abnormalities. The left ventricular internal cavity size was normal in size. There is  no left ventricular hypertrophy. Left ventricular  diastolic parameters are indeterminate. Right Ventricle: The right ventricular size is moderately enlarged. No increase in right ventricular wall thickness. Right ventricular systolic function is normal. Left Atrium: Left atrial size was mildly dilated. Right Atrium: Right atrial size was normal in size. Pericardium: There is no evidence of pericardial effusion. Mitral Valve: The mitral valve is normal in structure. Mild mitral valve regurgitation. No evidence of mitral valve stenosis. Tricuspid Valve: The tricuspid valve is not well visualized. Tricuspid valve regurgitation is trivial. No evidence of tricuspid stenosis. Aortic Valve: The aortic valve was not well visualized. Aortic valve regurgitation is not visualized. No aortic stenosis is present. Pulmonic Valve: The pulmonic valve was not well visualized. Pulmonic valve regurgitation is not visualized. Aorta: The ascending aorta was not well visualized and the aortic root is normal in size and structure.  Venous: The inferior vena cava is normal in size with less than 50% respiratory variability, suggesting right atrial pressure of 8 mmHg. IAS/Shunts: No atrial level shunt detected by color flow Doppler.  LEFT VENTRICLE PLAX 2D LVIDd:         4.30 cm LVIDs:         3.30 cm LV PW:         0.90 cm LV IVS:        0.80 cm LVOT diam:     1.90 cm LV SV:         40 LV SV Index:   22 LVOT Area:     2.84 cm  RIGHT VENTRICLE RV S prime:     11.00 cm/s TAPSE (M-mode): 1.8 cm LEFT ATRIUM             Index        RIGHT ATRIUM           Index LA diam:        4.60 cm 2.54 cm/m   RA Area:     18.60 cm LA Vol (A2C):   61.4 ml 33.91 ml/m  RA Volume:   50.60 ml  27.95 ml/m LA Vol (A4C):   69.5 ml 38.39 ml/m LA Biplane Vol: 64.4 ml 35.57 ml/m  AORTIC VALVE LVOT Vmax:   71.80 cm/s LVOT Vmean:  54.200 cm/s LVOT VTI:    0.140 m  AORTA Ao Root diam: 3.10 cm  SHUNTS Systemic VTI:  0.14 m Systemic Diam: 1.90 cm Rudean Haskell MD Electronically signed by Rudean Haskell  MD Signature Date/Time: 09/16/2022/1:09:51 PM    Final    DG Chest Port 1 View  Result Date: 09/15/2022 CLINICAL DATA:  Palpitations EXAM: PORTABLE CHEST 1 VIEW COMPARISON:  Chest x-ray 04/28/2017 FINDINGS: Heart is mildly enlarged. There are increased central interstitial markings. There are small bilateral pleural effusions. No pneumothorax or acute fracture. IMPRESSION: Mild cardiomegaly and pulmonary edema. Small bilateral pleural effusions. Electronically Signed   By: Ronney Asters M.D.   On: 09/15/2022 18:17   DG C-Arm 1-60 Min-No Report  Result Date: 09/15/2022 Fluoroscopy was utilized by the requesting physician.  No radiographic interpretation.   CT ABDOMEN PELVIS WO CONTRAST  Result Date: 08/19/2022 CLINICAL DATA:  Evaluate stone burden status post intervention. EXAM: CT ABDOMEN AND PELVIS WITHOUT CONTRAST TECHNIQUE: Multidetector CT imaging of the abdomen and pelvis was performed following the standard protocol without IV contrast. RADIATION DOSE REDUCTION: This exam was performed according to the departmental dose-optimization program which includes automated exposure control, adjustment of the mA and/or kV according to patient size and/or use of iterative reconstruction technique. COMPARISON:  CT 11/08/2021 FINDINGS: Lower chest: Linear scarring at the lung bases. Hepatobiliary: No focal hepatic lesion. Gallbladder normal. No biliary duct dilatation. Common bile duct is normal. Pancreas: Pancreas is normal. No ductal dilatation. No pancreatic inflammation. Spleen: Normal spleen Adrenals/urinary tract: Adrenal glands normal. LEFT percutaneous nephrostomy tube with tip in the LEFT renal hilum. LEFT double-J ureteral stent appropriate position. Previous seen large calculus in the LEFT renal hilum has been extracted. 3 mm calculus is present in the LEFT lower pole kidney (image 29/2). Smaller punctate calculus in the upper pole LEFT kidney (image 19/2). No calculi identified along the course  of the ureter. No bladder calculi. Several small 3 mm calculi in the RIGHT kidney. No RIGHT ureterolithiasis. Stomach/Bowel: Stomach, small bowel, appendix, and cecum are normal. The colon and rectosigmoid colon are normal. Vascular/Lymphatic: Abdominal aorta is normal  caliber with atherosclerotic calcification. There is no retroperitoneal or periportal lymphadenopathy. No pelvic lymphadenopathy. Reproductive: Post hysterectomy Other: No free fluid. Musculoskeletal: No aggressive osseous lesion. IMPRESSION: 1. Near complete extraction of the large LEFT renal calculus. 2. No calculi identified along the course of the LEFT ureter. Electronically Signed   By: Suzy Bouchard M.D.   On: 08/19/2022 12:01   DG C-Arm 1-60 Min-No Report  Result Date: 08/18/2022 Fluoroscopy was utilized by the requesting physician.  No radiographic interpretation.   IR URETERAL STENT LEFT NEW ACCESS W/O SEP NEPHROSTOMY CATH  Result Date: 08/18/2022 INDICATION: Large left renal pelvis calculus EXAM: ULTRASOUND FLUOROSCOPIC LEFT NEPHROSTOMY ACCESS FOR OPERATIVE NEPHROLITHOTOMY COMPARISON:  11/08/2021 CT MEDICATIONS: 2 G ROCEPHIN; The antibiotic was administered in an appropriate time frame prior to skin puncture. ANESTHESIA/SEDATION: Moderate (conscious) sedation was employed during this procedure. A total of Versed 1.5 mg and Fentanyl 70 mcg was administered intravenously by the radiology nurse. Total intra-service moderate Sedation Time: 35 minutes. The patient's level of consciousness and vital signs were monitored continuously by radiology nursing throughout the procedure under my direct supervision. CONTRAST:  10 CC-administered into the collecting system(s) FLUOROSCOPY: Radiation Exposure Index (as provided by the fluoroscopic device): 30 mGy Kerma COMPLICATIONS: None immediate. PROCEDURE: Informed written consent was obtained from the patient after a thorough discussion of the procedural risks, benefits and alternatives. All  questions were addressed. Maximal Sterile Barrier Technique was utilized including caps, mask, sterile gowns, sterile gloves, sterile drape, hand hygiene and skin antiseptic. A timeout was performed prior to the initiation of the procedure. previous imaging reviewed. patient position prone. preliminary ultrasound performed. the left kidney lower pole posterior calyx containing an echogenic calculus was localized and marked. under sterile conditions and local anesthesia, an 18 gauge 15 cm access was advanced percutaneously under ultrasound to the lower pole posterior calyx containing a stone. stone was felt on the needle tip. there was return of trace urine. contrast injection confirms position within the calyx. bentson guidewire inserted which was coiled in the renal pelvis adjacent to the large stone. five french catheter advanced. catheter guidewire access manipulated past the stone into the ureter and advanced into the bladder. contrast injection confirms position in the bladder. images obtained for documentation. access secured with 0 silk suture. external caps and a sterile dressing applied. patient tolerated the procedure well. no immediate complication. IMPRESSION: Successful left percutaneous nephrostomy catheter placement with guidewire access past the stone into the bladder. Electronically Signed   By: Jerilynn Mages.  Shick M.D.   On: 08/18/2022 10:20     Assessment/Plan: 2.3cm left renal pelvic stone with mild obstruction on CT in 2/23.   She is doing well s/p PCNL with f/u URS and stent removal.   She has no flank pain.   She did have afib postop but that has improved.  Renal US prior to 6 week f/u.   Chronic cystitis.   UA just has  microhematuria.  I will reassess in 6 weeks.   No orders of the defined types were placed in this encounter.    Orders Placed This Encounter  Procedures   Microscopic Examination   Ultrasound renal complete    Standing Status:   Future    Standing Expiration Date:    09/22/2023    Order Specific Question:   Reason for Exam (SYMPTOM  OR DIAGNOSIS REQUIRED)    Answer:   renal stones    Order Specific Question:   Preferred imaging location?    Answer:  [facility]   Urinalysis, Routine w reflex microscopic     Return in about 6 weeks (around 11/02/2022) for with renal US. .    CC: Dr. Consuello Masse.      Irine Seal 09/22/2022

## 2022-09-22 LAB — MICROSCOPIC EXAMINATION: Bacteria, UA: NONE SEEN

## 2022-09-22 LAB — URINALYSIS, ROUTINE W REFLEX MICROSCOPIC
Bilirubin, UA: NEGATIVE
Glucose, UA: NEGATIVE
Ketones, UA: NEGATIVE
Leukocytes,UA: NEGATIVE
Nitrite, UA: NEGATIVE
Protein,UA: NEGATIVE
Specific Gravity, UA: 1.01 (ref 1.005–1.030)
Urobilinogen, Ur: 0.2 mg/dL (ref 0.2–1.0)
pH, UA: 6 (ref 5.0–7.5)

## 2022-10-04 ENCOUNTER — Ambulatory Visit: Payer: PPO | Attending: Internal Medicine | Admitting: Internal Medicine

## 2022-10-04 ENCOUNTER — Encounter: Payer: Self-pay | Admitting: Internal Medicine

## 2022-10-04 VITALS — BP 132/84 | HR 78 | Ht 66.0 in | Wt 156.4 lb

## 2022-10-04 DIAGNOSIS — I4891 Unspecified atrial fibrillation: Secondary | ICD-10-CM

## 2022-10-04 MED ORDER — AMIODARONE HCL 200 MG PO TABS: 200.0000 mg | ORAL_TABLET | Freq: Two times a day (BID) | ORAL | 3 refills | Status: DC

## 2022-10-04 NOTE — Patient Instructions (Addendum)
Medication Instructions:  Your physician has recommended you make the following change in your medication:  Start amiodarone 200 mg twice daily for 3 weeks, then reduce to 200 mg daily Continue other medications the same  Labwork: none  Testing/Procedures: none  Follow-Up: Your physician recommends that you schedule a follow-up appointment in: 3 months  Any Other Special Instructions Will Be Listed Below (If Applicable).  If you need a refill on your cardiac medications before your next appointment, please call your pharmacy.

## 2022-10-04 NOTE — Progress Notes (Signed)
Cardiology Office Note  Date: 10/04/2022   ID: Tanya Mueller, DOB December 21, 1934, MRN [id number]  PCP:  Manon Hilding, MD  Cardiologist:  Chalmers Guest, MD Electrophysiologist:  None   Reason for Office Visit: Follow-up of A-fib  History of Present Illness: Tanya Mueller is a 88 y.o. female known to have HTN presented to cardiology clinic for  follow-up visit. She was previously followed by Dr. Bronson Ing for palpitations for which she had a event monitor in 2018 that showed symptomatic PACs and echo showed normal LVEF and no valve abnormalities. She was eventually diagnosed with atrial fibrillation on EKG and Apple Watch EKG strips as well in 09/2022 after which patient was started on metoprolol tartrate 50 mg twice daily.  She underwent a cystoscopy in the second week of December 3474 with no complications but upon discharge from the hospital, she came right back to ER with A-fib with RVR, on diltiazem drip, stabilized and increased dose of metoprolol tartrate from 50 mg to 75 mg twice daily upon discharge. Coumadin was also switched to Eliquis. Patient is here for follow-up visit. She did not have any additional ER visits thereafter however continues to have palpitations at rest and with moving around.  Heart rate on her smart watch ranged between 70-100s. Denied any DOE, dizziness, lightheadedness, syncope, LE swelling.  Past Medical History:  Diagnosis Date   Allergic rhinitis    Arthritis    Back ,hips   Cancer ([place])    Complication of anesthesia    awareness during general anesthesia.   Dysrhythmia    A-Fib and PSVT   History of atrial fibrillation    episode 2013 converted with cardizem-- happened at time acute illness due to kidney stone --  resolved  no issues since   History of kidney stones    History of sepsis    urosepsis  2013 due to kidney stone   Hypertension    Left nephrolithiasis    Left ureteral calculus    Neuromuscular disorder (HCC)    PAC (premature  atrial contraction)    Solitary lung nodule    benign right middle lobe per CT    Past Surgical History:  Procedure Laterality Date   BACK SURGERY  2015   rod and screws   CATARACT EXTRACTION W/ INTRAOCULAR LENS  IMPLANT, BILATERAL  10/02/2008   CYSTOSCOPY WITH BIOPSY N/A 04/08/2019   Procedure: CYSTOSCOPY WITH bladder BIOPSY, FULGURATION;  Surgeon: Festus Aloe, MD;  Location: [facility];  Service: Urology;  Laterality: N/A;   CYSTOSCOPY WITH STENT PLACEMENT Left 09/15/2014   Procedure: CYSTOSCOPY WITH STENT PLACEMENT;  Surgeon: Georgette Dover, MD;  Location: [facility];  Service: Urology;  Laterality: Left;   CYSTOSCOPY/RETROGRADE/URETEROSCOPY Left 08/09/2014   Procedure: CYSTOSCOPY/RETROGRADE/LEFT STENT;  Surgeon: Bernestine Amass, MD;  Location: [facility];  Service: Urology;  Laterality: Left;   CYSTOSCOPY/URETEROSCOPY/HOLMIUM LASER/STENT PLACEMENT Left 09/15/2022   Procedure: CYSTOSCOPY LEFT URETEROSCOPY/STONE BASKET EXTRACTION;  Surgeon: Irine Seal, MD;  Location: [facility];  Service: Urology;  Laterality: Left;  1 HR FOR CASE   EXTRACORPOREAL SHOCK WAVE LITHOTRIPSY  10/03/2011   HOLMIUM LASER APPLICATION Left 25/95/6387   Procedure: HOLMIUM LASER APPLICATION;  Surgeon: Georgette Dover, MD;  Location: [facility];  Service: Urology;  Laterality: Left;   IR URETERAL STENT LEFT NEW ACCESS W/O SEP NEPHROSTOMY CATH  08/18/2022   LAPAROTOMY W/  UNILATERAL SALPINGOOPHORECTOMY  age 48   NEPHROLITHOTOMY Left 08/18/2022   Procedure: LEFT  NEPHROLITHOTOMY PERCUTANEOUS;  Surgeon: Irine Seal, MD;  Location: [facility];  Service: Urology;  Laterality: Left;  2.5 HRS FOR CASE   TOTAL ABDOMINAL HYSTERECTOMY  age 59   w/ unilateral salpingoophorectomy   TRANSTHORACIC ECHOCARDIOGRAM  02/13/2012   mild LVH/  ef 65%/  trivial MR  &  PR/  mild RVE   URETEROSCOPY Left 09/15/2014   Procedure: URETEROSCOPY;  Surgeon: Georgette Dover, MD;  Location: [facility];  Service: Urology;  Laterality: Left;    Current Outpatient Medications  Medication Sig Dispense Refill   acetaminophen (TYLENOL) 325 MG tablet Take 2 tablets (650 mg total) by mouth every 6 (six) hours as needed for mild pain (or Fever >/= 101). 30 tablet 0   amiodarone (PACERONE) 200 MG tablet Take 1 tablet (200 mg total) by mouth 2 (two) times daily. For 3 weeks, then reduce to 200 mg daily 60 tablet 3   amLODipine (NORVASC) 2.5 MG tablet Take 1 tablet (2.5 mg total) by mouth daily. 90 tablet 3   apixaban (ELIQUIS) 5 MG TABS tablet Take 1 tablet (5 mg total) by mouth 2 (two) times daily. 60 tablet 5   cyclobenzaprine (FLEXERIL) 10 MG tablet Take 10 mg by mouth at bedtime as needed and may repeat dose one time if needed for muscle spasms.     fluticasone (FLONASE) 50 MCG/ACT nasal spray Place 2 sprays into the nose daily as needed for allergies.      furosemide (LASIX) 40 MG tablet Take 1 tablet (40 mg total) by mouth daily. 30 tablet 0   losartan (COZAAR) 50 MG tablet Take 1 tablet (50 mg total) by mouth daily. 90 tablet 3   lubiprostone (AMITIZA) 24 MCG capsule TAKE 1 CAPSULE TWICE DAILY WITH A MEAL. (Patient taking differently: Take 24 mcg by mouth 2 (two) times daily with a meal.) 60 capsule 0   metoprolol tartrate (LOPRESSOR) 50 MG tablet Take 1.5 tablets (75 mg total) by mouth 2 (two) times daily. 270 tablet 3   ondansetron (ZOFRAN) 4 MG tablet Take 1 tablet (4 mg total) by mouth every 8 (eight) hours as needed for nausea or vomiting. 30 tablet 2   oxyCODONE-acetaminophen (PERCOCET) 10-325 MG tablet Take 1 tablet by mouth 4 (four) times daily as needed for pain.     polyethylene glycol (MIRALAX / GLYCOLAX) 17 g packet Take 17 g by mouth daily as needed for moderate constipation.     potassium chloride (KLOR-CON) 10 MEQ tablet Take 1 tablet (10 mEq total) by mouth daily. Take While taking Lasix/furosemide 30 tablet 0   vitamin E 180 MG (400 UNITS) capsule Take 400 Units by mouth in  the morning.     No current facility-administered medications for this visit.   Allergies:  Morphine   Social History: The patient  reports that she has never smoked. She has never been exposed to tobacco smoke. She has never used smokeless tobacco. She reports that she does not drink alcohol and does not use drugs.   Family History: The patient's family history includes Cancer in some other family members.   ROS:  Please see the history of present illness. Otherwise, complete review of systems is positive for none.  All other systems are reviewed and negative.   Physical Exam: VS:  BP 132/84   Pulse 78   Ht '5\' 6"'$  (1.676 m)   Wt 156 lb 6.4 oz (70.9 kg)   SpO2 95%   BMI 25.24 kg/m , BMI Body  mass index is 25.24 kg/m.  Wt Readings from Last 3 Encounters:  10/04/22 156 lb 6.4 oz (70.9 kg)  09/16/22 158 lb 4.6 oz (71.8 kg)  09/15/22 162 lb (73.5 kg)    General: Patient appears comfortable at rest. HEENT: Conjunctiva and lids normal, oropharynx clear with moist mucosa. Neck: Supple, no elevated JVP or carotid bruits, no thyromegaly. Lungs: Clear to auscultation, nonlabored breathing at rest. Cardiac: Irregular rate and rhythm, no S3 or significant systolic murmur, no pericardial rub. Abdomen: Soft, nontender, no hepatomegaly, bowel sounds present, no guarding or rebound. Extremities: No pitting edema, distal pulses 2+. Skin: Warm and dry. Musculoskeletal: No kyphosis. Neuropsychiatric: Alert and oriented x3, affect grossly appropriate.  ECG today:  Atrial Fibrillation  Recent Labwork: 09/15/2022: B Natriuretic Peptide 673.0; TSH 1.165 09/16/2022: ALT 12; AST 22; BUN 9; Creatinine, Ser 0.69; Hemoglobin 13.3; Magnesium 2.3; Platelets 435; Potassium 4.0; Sodium 137  No results found for: "CHOL", "TRIG", "HDL", "CHOLHDL", "VLDL", "LDLCALC", "LDLDIRECT"  Other Studies Reviewed Today: I personally reviewed the Holter monitor and echo report from 2018 and 2023 Event monitor from  2018 showed PACs but no atrial fibrillation Echo from 2018 and 2023 showed normal LVEF and no valve abnormalities  Assessment and Plan: Patient is a 88 year old F known to have HTN presented to cardiology clinic for follow-up visit.  # Persistent atrial fibrillation, CV score is 4 -Continue metoprolol tartrate 75 mg twice daily.  Will start amiodarone regimen for adequate control of heart rates. Start amiodarone 200 mg twice daily for 3 weeks followed by amiodarone 200 mg once daily thereafter. -Continue Eliquis 5 mg twice daily. Can switch to Coumadin if Eliquis becomes cost prohibitive in the future. No risk of falls.  # HTN, controlled -Continue metoprolol tartrate 75 mg twice daily -Continue losartan 50 mg once daily -Continue amlodipine 2.5 mg once daily (was previously on 5 mg dose but had to lower it down due to ankle swelling)  I have spent a total of 33 minutes with patient reviewing chart, EKGs, labs and examining patient as well as establishing an assessment and plan that was discussed with the patient.  > 50% of time was spent in direct patient care.     Medication Adjustments/Labs and Tests Ordered: Current medicines are reviewed at length with the patient today.  Concerns regarding medicines are outlined above.   Tests Ordered: Orders Placed This Encounter  Procedures   EKG 12-Lead    Medication Changes: Meds ordered this encounter  Medications   amiodarone (PACERONE) 200 MG tablet    Sig: Take 1 tablet (200 mg total) by mouth 2 (two) times daily. For 3 weeks, then reduce to 200 mg daily    Dispense:  60 tablet    Refill:  3    10/04/2022 NEW    Disposition:  Follow up  3 month  Signed, Seniya Stoffers Fidel Levy, MD, 10/04/2022 3:27 PM    [facility] [address] [place], [place] [address]

## 2022-10-11 ENCOUNTER — Other Ambulatory Visit: Payer: Self-pay | Admitting: Gastroenterology

## 2022-10-11 DIAGNOSIS — T402X5A Adverse effect of other opioids, initial encounter: Secondary | ICD-10-CM

## 2022-10-13 ENCOUNTER — Other Ambulatory Visit: Payer: Self-pay | Admitting: Gastroenterology

## 2022-10-13 ENCOUNTER — Telehealth: Payer: Self-pay

## 2022-10-13 DIAGNOSIS — T402X5A Adverse effect of other opioids, initial encounter: Secondary | ICD-10-CM

## 2022-10-13 MED ORDER — LUBIPROSTONE 24 MCG PO CAPS: 24.0000 ug | ORAL_CAPSULE | Freq: Two times a day (BID) | ORAL | 2 refills | Status: DC

## 2022-10-13 NOTE — Telephone Encounter (Signed)
Pt request a refill on her Amitiza. Her last ov was  07/19/2022

## 2022-10-13 NOTE — Telephone Encounter (Signed)
Phoned and spoke with the pt and advised her that the Rx for her Amitiza has been sent to her pharmacy but wait until lunch time before they go to the pharmacy or to call first to make sure it is ready.

## 2022-10-16 DIAGNOSIS — Z6825 Body mass index (BMI) 25.0-25.9, adult: Secondary | ICD-10-CM | POA: Diagnosis not present

## 2022-10-16 DIAGNOSIS — N200 Calculus of kidney: Secondary | ICD-10-CM | POA: Diagnosis not present

## 2022-10-16 DIAGNOSIS — F411 Generalized anxiety disorder: Secondary | ICD-10-CM | POA: Diagnosis not present

## 2022-10-16 DIAGNOSIS — M545 Low back pain, unspecified: Secondary | ICD-10-CM | POA: Diagnosis not present

## 2022-10-16 DIAGNOSIS — I4891 Unspecified atrial fibrillation: Secondary | ICD-10-CM | POA: Diagnosis not present

## 2022-10-16 DIAGNOSIS — R030 Elevated blood-pressure reading, without diagnosis of hypertension: Secondary | ICD-10-CM | POA: Diagnosis not present

## 2022-10-25 ENCOUNTER — Telehealth: Payer: Self-pay | Admitting: *Deleted

## 2022-10-25 NOTE — Progress Notes (Signed)
  Care Coordination   Note   10/25/2022 Name: [name] MRN: [id number] DOB: 1935-02-01  [name] is a 88 y.o. year old female who sees Sasser, Silvestre Moment, MD for primary care. I reached out to [name] by phone today to offer care coordination services.  [name] was given information about Care Coordination services today including:   The Care Coordination services include support from the care team which includes your Nurse Coordinator, Clinical Social Worker, or Pharmacist.  The Care Coordination team is here to help remove barriers to the health concerns and goals most important to you. Care Coordination services are voluntary, and the patient may decline or stop services at any time by request to their care team member.   Care Coordination Consent Status: Patient did not agree to participate in care coordination services at this time. Due to working with upstream.     Encounter Outcome:  No Answer  [place]  Direct Dial: [phone]

## 2022-10-26 ENCOUNTER — Telehealth: Payer: Self-pay | Admitting: Internal Medicine

## 2022-10-26 ENCOUNTER — Ambulatory Visit: Payer: Self-pay | Admitting: *Deleted

## 2022-10-26 MED ORDER — AMIODARONE HCL 200 MG PO TABS: 100.0000 mg | ORAL_TABLET | Freq: Every day | ORAL | 3 refills | Status: DC

## 2022-10-26 NOTE — Telephone Encounter (Signed)
Patient's daughter Rudsill informed and verbalized understanding of plan.

## 2022-10-26 NOTE — Telephone Encounter (Signed)
Pt c/o medication issue:  1. Name of Medication:   amiodarone (PACERONE) 200 MG tablet   2. How are you currently taking this medication (dosage and times per day)? As prescribed  3. Are you having a reaction (difficulty breathing--STAT)?   No  4. What is your medication issue?   Daughter stated the patient has been feeling jittery, nervous and unable to sleep for the last three days and is concerned this new medication may be causing her symptoms.

## 2022-10-30 ENCOUNTER — Ambulatory Visit (HOSPITAL_COMMUNITY): Payer: PPO

## 2022-10-30 DIAGNOSIS — E876 Hypokalemia: Secondary | ICD-10-CM | POA: Diagnosis not present

## 2022-10-30 DIAGNOSIS — E7801 Familial hypercholesterolemia: Secondary | ICD-10-CM | POA: Diagnosis not present

## 2022-10-30 DIAGNOSIS — D649 Anemia, unspecified: Secondary | ICD-10-CM | POA: Diagnosis not present

## 2022-10-30 DIAGNOSIS — Z6824 Body mass index (BMI) 24.0-24.9, adult: Secondary | ICD-10-CM | POA: Diagnosis not present

## 2022-10-30 DIAGNOSIS — R030 Elevated blood-pressure reading, without diagnosis of hypertension: Secondary | ICD-10-CM | POA: Diagnosis not present

## 2022-10-30 DIAGNOSIS — N1831 Chronic kidney disease, stage 3a: Secondary | ICD-10-CM | POA: Diagnosis not present

## 2022-10-30 DIAGNOSIS — I10 Essential (primary) hypertension: Secondary | ICD-10-CM | POA: Diagnosis not present

## 2022-10-30 DIAGNOSIS — E039 Hypothyroidism, unspecified: Secondary | ICD-10-CM | POA: Diagnosis not present

## 2022-10-30 DIAGNOSIS — F419 Anxiety disorder, unspecified: Secondary | ICD-10-CM | POA: Diagnosis not present

## 2022-10-30 DIAGNOSIS — E7849 Other hyperlipidemia: Secondary | ICD-10-CM | POA: Diagnosis not present

## 2022-11-01 DIAGNOSIS — I10 Essential (primary) hypertension: Secondary | ICD-10-CM | POA: Diagnosis not present

## 2022-11-01 DIAGNOSIS — I4891 Unspecified atrial fibrillation: Secondary | ICD-10-CM | POA: Diagnosis not present

## 2022-11-01 DIAGNOSIS — E782 Mixed hyperlipidemia: Secondary | ICD-10-CM | POA: Diagnosis not present

## 2022-11-02 ENCOUNTER — Ambulatory Visit: Payer: PPO | Admitting: Urology

## 2022-11-03 ENCOUNTER — Encounter: Payer: Self-pay | Admitting: Nurse Practitioner

## 2022-11-03 ENCOUNTER — Ambulatory Visit: Payer: PPO | Attending: Nurse Practitioner | Admitting: Nurse Practitioner

## 2022-11-03 VITALS — BP 124/72 | HR 62 | Ht 66.0 in | Wt 151.6 lb

## 2022-11-03 DIAGNOSIS — R450 Nervousness: Secondary | ICD-10-CM

## 2022-11-03 DIAGNOSIS — Z7901 Long term (current) use of anticoagulants: Secondary | ICD-10-CM

## 2022-11-03 DIAGNOSIS — Z79899 Other long term (current) drug therapy: Secondary | ICD-10-CM

## 2022-11-03 DIAGNOSIS — I4891 Unspecified atrial fibrillation: Secondary | ICD-10-CM

## 2022-11-03 DIAGNOSIS — F439 Reaction to severe stress, unspecified: Secondary | ICD-10-CM

## 2022-11-03 DIAGNOSIS — G4700 Insomnia, unspecified: Secondary | ICD-10-CM

## 2022-11-03 DIAGNOSIS — I10 Essential (primary) hypertension: Secondary | ICD-10-CM

## 2022-11-03 DIAGNOSIS — I4892 Unspecified atrial flutter: Secondary | ICD-10-CM

## 2022-11-03 NOTE — Patient Instructions (Addendum)
Medication Instructions:  Your physician recommends that you continue on your current medications as directed. Please refer to the Current Medication list given to you today.  Labwork: CMET, Thyroid Panel and Magnesium Level today [facility]  Testing/Procedures: none  Follow-Up: Your physician recommends that you schedule a follow-up appointment in: 7-8 weeks  Any Other Special Instructions Will Be Listed Below (If Applicable).  If you need a refill on your cardiac medications before your next appointment, please call your pharmacy.

## 2022-11-03 NOTE — Progress Notes (Unsigned)
Cardiology Office Note:    Date:  11/03/2022  ID:  Tanya Mueller, DOB April 01, 1935, MRN [id number]  PCP:  Manon Hilding, MD   [facility] Prov[facility] Cardiologist:  Chalmers Guest, MD     Referring MD: Manon Hilding, MD   CC: Jitteriness/ A-fib follow-up  History of Present Illness:    Tanya Mueller is a 88 y.o. female with a hx of the following:  A-fib/ A-flutter, PSVT PACs Hypertension  Patient is a delightful 88 year old female with past medical history as mentioned above.  Previously followed by Dr. Bronson Ing for palpitations. Monitor in 2018 showed symptomatic PACs.  Echocardiogram was normal.  Diagnosed with A-fib in December 2023, started on metoprolol tartrate 50 mg twice daily.  Underwent cystoscopy in December 2023 and upon discharge from hospital went into A-fib with RVR, on diltiazem drip, and metoprolol dose was increased to 75 mg twice daily.  Coumadin was switched to Eliquis.  Last seen by Dr. Dellia Cloud on 10/04/2022.  Patient continued to note palpitations at rest and with moving around.  Heart rate monitored on smart watch revealed heart rate averaging 70-100's BPM.  Denied any other associated symptoms.  Amiodarone was started for adequate heart rate controls.  Was told to take amiodarone 200 mg twice daily for 3 weeks, followed by amiodarone 200 mg daily thereafter.  Was told to follow-up in 3 months.  Our office was contacted last week.  Daughter stated patient had been feeling nervous, jittery, and unable to sleep and thought that amiodarone may be contributing to her symptoms.  Dr. Dellia Cloud stated it was less likely to be due to amiodarone, but suggested to decrease amiodarone dosage to 100 mg to see if symptoms resolved.  Today she presents for evaluation with her daughter.  She states her heart rate is not racing after starting amiodarone; however she is still have jitteriness. Saw her PCP recently who started her on Zoloft. Has been under  significant stress per daughter and patient's report. Gets around 3-4 hours of sleep per night. Denies any chest pain, shortness of breath, palpitations, syncope, presyncope, dizziness, orthopnea, PND, swelling or significant weight changes, acute bleeding, or claudication. Denies any other questions or concerns.   Past Medical History:  Diagnosis Date   Allergic rhinitis    Arthritis    Back ,hips   Cancer ([place])    Complication of anesthesia    awareness during general anesthesia.   Dysrhythmia    A-Fib and PSVT   History of atrial fibrillation    episode 2013 converted with cardizem-- happened at time acute illness due to kidney stone --  resolved  no issues since   History of kidney stones    History of sepsis    urosepsis  2013 due to kidney stone   Hypertension    Left nephrolithiasis    Left ureteral calculus    Neuromuscular disorder (HCC)    PAC (premature atrial contraction)    Solitary lung nodule    benign right middle lobe per CT    Past Surgical History:  Procedure Laterality Date   BACK SURGERY  2015   rod and screws   CATARACT EXTRACTION W/ INTRAOCULAR LENS  IMPLANT, BILATERAL  10/02/2008   CYSTOSCOPY WITH BIOPSY N/A 04/08/2019   Procedure: CYSTOSCOPY WITH bladder BIOPSY, FULGURATION;  Surgeon: Festus Aloe, MD;  Location: [facility];  Service: Urology;  Laterality: N/A;   CYSTOSCOPY WITH STENT PLACEMENT Left 09/15/2014   Procedure: CYSTOSCOPY WITH STENT PLACEMENT;  Surgeon: Georgette Dover, MD;  Location: [facility];  Service: Urology;  Laterality: Left;   CYSTOSCOPY/RETROGRADE/URETEROSCOPY Left 08/09/2014   Procedure: CYSTOSCOPY/RETROGRADE/LEFT STENT;  Surgeon: Bernestine Amass, MD;  Location: [facility];  Service: Urology;  Laterality: Left;   CYSTOSCOPY/URETEROSCOPY/HOLMIUM LASER/STENT PLACEMENT Left 09/15/2022   Procedure: CYSTOSCOPY LEFT URETEROSCOPY/STONE BASKET EXTRACTION;  Surgeon: Irine Seal, MD;  Location: [facility];  Service: Urology;   Laterality: Left;  1 HR FOR CASE   EXTRACORPOREAL SHOCK WAVE LITHOTRIPSY  10/03/2011   HOLMIUM LASER APPLICATION Left 41/66/0630   Procedure: HOLMIUM LASER APPLICATION;  Surgeon: Georgette Dover, MD;  Location[facility];  Service: Urology;  Laterality: Left;   IR URETERAL STENT LEFT NEW ACCESS W/O SEP NEPHROSTOMY CATH  08/18/2022   LAPAROTOMY W/  UNILATERAL SALPINGOOPHORECTOMY  age 17   NEPHROLITHOTOMY Left 08/18/2022   Procedure: LEFT NEPHROLITHOTOMY PERCUTANEOUS;  Surgeon: Irine Seal, MD;  Location: [facility];  Service: Urology;  Laterality: Left;  2.5 HRS FOR CASE   TOTAL ABDOMINAL HYSTERECTOMY  age 27   w/ unilateral salpingoophorectomy   TRANSTHORACIC ECHOCARDIOGRAM  02/13/2012   mild LVH/  ef 65%/  trivial MR  &  PR/  mild RVE   URETEROSCOPY Left 09/15/2014   Procedure: URETEROSCOPY;  Surgeon: Georgette Dover, MD;  Location: [facility];  Service: Urology;  Laterality: Left;    Current Medications: Current Meds  Medication Sig   acetaminophen (TYLENOL) 325 MG tablet Take 2 tablets (650 mg total) by mouth every 6 (six) hours as needed for mild pain (or Fever >/= 101).   amiodarone (PACERONE) 200 MG tablet Take 200 mg by mouth daily.   amLODipine (NORVASC) 2.5 MG tablet Take 1 tablet (2.5 mg total) by mouth daily.   apixaban (ELIQUIS) 5 MG TABS tablet Take 1 tablet (5 mg total) by mouth 2 (two) times daily.   fluticasone (FLONASE) 50 MCG/ACT nasal spray Place 2 sprays into the nose daily as needed for allergies.    losartan (COZAAR) 50 MG tablet Take 1 tablet (50 mg total) by mouth daily.   lubiprostone (AMITIZA) 24 MCG capsule Take 1 capsule (24 mcg total) by mouth 2 (two) times daily with a meal.   metoprolol tartrate (LOPRESSOR) 50 MG tablet Take 1.5 tablets (75 mg total) by mouth 2 (two) times daily.   ondansetron (ZOFRAN) 4 MG tablet Take 1 tablet (4 mg total) by mouth every 8 (eight) hours as needed for nausea or vomiting.    oxyCODONE-acetaminophen (PERCOCET) 10-325 MG tablet Take 1 tablet by mouth 4 (four) times daily as needed for pain.   polyethylene glycol (MIRALAX / GLYCOLAX) 17 g packet Take 17 g by mouth daily as needed for moderate constipation.   sertraline (ZOLOFT) 25 MG tablet Take 25 mg by mouth daily.     Allergies:   Morphine   Social History   Socioeconomic History   Marital status: Married    Spouse name: Not on file   Number of children: 1   Years of education: Not on file   Highest education level: Not on file  Occupational History    Employer: RETIRED  Tobacco Use   Smoking status: Never    Passive exposure: Never   Smokeless tobacco: Never  Vaping Use   Vaping Use: Never used  Substance and Sexual Activity   Alcohol use: No   Drug use: No   Sexual activity: Not on file  Other Topics Concern   Not on file  Social History Narrative   Not on file   Social Determinants of Health   Financial Resource Strain: Not on file  Food Insecurity: No Food Insecurity (09/16/2022)   Hunger Vital Sign    Worried About Running Out of Food in the Last Year: Never true    Ran Out of Food in the Last Year: Never true  Transportation Needs: No Transportation Needs (09/16/2022)   PRAPARE - Hydrologist (Medical): No    Lack of Transportation (Non-Medical): No  Physical Activity: Not on file  Stress: Not on file  Social Connections: Not on file     Family History: The patient's family history includes Cancer in some other family members.  ROS:   Review of Systems  Constitutional:  Positive for malaise/fatigue. Negative for chills, diaphoresis, fever and weight loss.  HENT: Negative.    Eyes: Negative.   Respiratory: Negative.    Cardiovascular: Negative.   Gastrointestinal: Negative.   Genitourinary: Negative.   Musculoskeletal: Negative.   Skin: Negative.   Neurological:  Positive for tremors. Negative for dizziness, tingling, sensory change, speech  change, focal weakness, seizures, loss of consciousness, weakness and headaches.       Jitteriness - see HPI.   Endo/Heme/Allergies: Negative.   Psychiatric/Behavioral:  Negative for depression, hallucinations, memory loss, substance abuse and suicidal ideas. The patient is nervous/anxious and has insomnia.     Please see the history of present illness.    All other systems reviewed and are negative.  EKGs/Labs/Other Studies Reviewed:    The following studies were reviewed today:   EKG:  EKG is ordered today.  The ekg ordered today demonstrates A-flutter, 60 bpm, with nonspecific ST/T wave changes, otherwise nothing acute.   Echocardiogram on 09/16/2022: 1. Left ventricular ejection fraction, by estimation, is 55 to 60%. The  left ventricle has normal function. The left ventricle has no regional  wall motion abnormalities. Left ventricular diastolic parameters are  indeterminate.   2. Right ventricular systolic function is normal. The right ventricular  size is moderately enlarged.   3. Left atrial size was mildly dilated.   4. The mitral valve is normal in structure. Mild mitral valve  regurgitation. No evidence of mitral stenosis.   5. The aortic valve was not well visualized. Aortic valve regurgitation  is not visualized. No aortic stenosis is present.   6. The inferior vena cava is normal in size with <50% respiratory  variability, suggesting right atrial pressure of 8 mmHg.   Comparison(s): Similar to 2018 Report.  Holter monitor on 05/21/2017: Sinus rhythm with frequent PAC's and isolated PVC's. Brief atrial runs noted. Symptoms correlated with PAC's.  Recent Labs: 09/15/2022: B Natriuretic Peptide 673.0; TSH 1.165 09/16/2022: ALT 12; BUN 9; Creatinine, Ser 0.69; Hemoglobin 13.3; Magnesium 2.3; Platelets 435; Potassium 4.0; Sodium 137  Recent Lipid Panel No results found for: "CHOL", "TRIG", "HDL", "CHOLHDL", "VLDL", "LDLCALC", "LDLDIRECT"   Risk  Assessment/Calculations:    CHA2DS2-VASc Score = 4  This indicates a 4.8% annual risk of stroke. The patient's score is based upon: CHF History: 0 HTN History: 1 Diabetes History: 0 Stroke History: 0 Vascular Disease History: 0 Age Score: 2 Gender Score: 1    Physical Exam:    VS:  BP 124/72   Pulse 62   Ht '5\' 6"'$  (1.676 m)   Wt 151 lb 9.6 oz (68.8 kg)   SpO2 95%   BMI 24.47 kg/m     Wt Readings from Last 3 Encounters:  11/03/22 151 lb 9.6 oz (68.8 kg)  10/04/22 156 lb 6.4 oz (70.9 kg)  09/16/22 158 lb 4.6 oz (71.8 kg)     GEN: Well nourished, well developed 88 y.o. female in no acute distress HEENT: Normal NECK: No JVD; No carotid bruits CARDIAC: S1/S2, RRR, no murmurs, rubs, gallops; 2+ pulses RESPIRATORY:  Clear to auscultation without rales, wheezing or rhonchi  MUSCULOSKELETAL:  No edema; No deformity  SKIN: Thin skin, warm and dry NEUROLOGIC:  Alert and oriented x 3; minimal tremors/ jitteriness noted on exam PSYCHIATRIC:  Normal affect   ASSESSMENT:    1. Atrial fibrillation, unspecified type ([place])   2. Atrial flutter, unspecified type ([place])   3. Medication management   4. Current use of long term anticoagulation   5. Hypertension, unspecified type   6. Jittery feeling   7. Stress   8. Insomnia, unspecified type    PLAN:    In order of problems listed above:  A-fib/A-flutter, long term use of anticoagulation, medication monitoring Denies any fast heart rates and symptoms improved with using amiodarone. Now taking reduced dose. However, noting jitteriness. EKG shows a flutter, 60 bpm, nonspecific ST/T wave changes, otherwise nothing acute. Patient prefers to continue Amiodarone at current dose. Continue Amiodarone and Lopressor. Continue Eliquis 5 mg BID, on appropriate dosage, denies any bleeding issues. If weight drops < 60 kg in future, will require reduced dosing to 2.5 mg BID.  Discussed to avoid A-fib triggers. Heart healthy diet recommended.  Will  obtain the following labs: Thyroid panel, CMET, and Magnesium. At next office visit, will discuss obtaining 2 view CXR. Recommended annual eye appts with eye doctor.   HTN Blood pressure stable today.  BP well-controlled at home.  Continue amlodipine, losartan, and Lopressor. Discussed to monitor BP at home at least 2 hours after medications and sitting for 5-10 minutes.  Heart healthy diet recommended.  Jittery feeling/Stress/Insomnia Noticed recently. Does admit to recently stress. Recently started on Zoloft by PCP, and discussed that full effect of medication should be noticed in 4-6 weeks. Will obtain labs as mentioned above. Continue to follow with PCP regarding Zoloft and sleep medicine.  4. Disposition: Follow-up in 7-8 weeks with me or APP or sooner if anything changes.    Medication Adjustments/Labs and Tests Ordered: Current medicines are reviewed at length with the patient today.  Concerns regarding medicines are outlined above.  Orders Placed This Encounter  Procedures   Comprehensive metabolic panel   Magnesium   Thyroid Panel With TSH   EKG 12-Lead   No orders of the defined types were placed in this encounter.   Patient Instructions  Medication Instructions:  Your physician recommends that you continue on your current medications as directed. Please refer to the Current Medication list given to you today.  Labwork: CMET, Thyroid Panel and Magnesium Level today [facility]  Testing/Procedures: none  Follow-Up: Your physician recommends that you schedule a follow-up appointment in: 7-8 weeks  Any Other Special Instructions Will Be Listed Below (If Applicable).  If you need a refill on your cardiac medications before your next appointment, please call your pharmacy.   Signed, Finis Bud, NP  11/04/2022 3:27 PM    [place]

## 2022-11-06 DIAGNOSIS — D7589 Other specified diseases of blood and blood-forming organs: Secondary | ICD-10-CM | POA: Diagnosis not present

## 2022-11-06 DIAGNOSIS — R5383 Other fatigue: Secondary | ICD-10-CM | POA: Diagnosis not present

## 2022-11-06 DIAGNOSIS — Z23 Encounter for immunization: Secondary | ICD-10-CM | POA: Diagnosis not present

## 2022-11-06 DIAGNOSIS — N1831 Chronic kidney disease, stage 3a: Secondary | ICD-10-CM | POA: Diagnosis not present

## 2022-11-06 DIAGNOSIS — E039 Hypothyroidism, unspecified: Secondary | ICD-10-CM | POA: Diagnosis not present

## 2022-11-06 DIAGNOSIS — R002 Palpitations: Secondary | ICD-10-CM | POA: Diagnosis not present

## 2022-11-06 DIAGNOSIS — M5432 Sciatica, left side: Secondary | ICD-10-CM | POA: Diagnosis not present

## 2022-11-06 DIAGNOSIS — R609 Edema, unspecified: Secondary | ICD-10-CM | POA: Diagnosis not present

## 2022-11-06 DIAGNOSIS — I10 Essential (primary) hypertension: Secondary | ICD-10-CM | POA: Diagnosis not present

## 2022-11-06 DIAGNOSIS — M48062 Spinal stenosis, lumbar region with neurogenic claudication: Secondary | ICD-10-CM | POA: Diagnosis not present

## 2022-11-06 DIAGNOSIS — R4582 Worries: Secondary | ICD-10-CM | POA: Diagnosis not present

## 2022-11-06 DIAGNOSIS — E1129 Type 2 diabetes mellitus with other diabetic kidney complication: Secondary | ICD-10-CM | POA: Diagnosis not present

## 2022-11-07 ENCOUNTER — Encounter: Payer: Self-pay | Admitting: *Deleted

## 2022-11-10 DIAGNOSIS — R030 Elevated blood-pressure reading, without diagnosis of hypertension: Secondary | ICD-10-CM | POA: Diagnosis not present

## 2022-11-10 DIAGNOSIS — Z6824 Body mass index (BMI) 24.0-24.9, adult: Secondary | ICD-10-CM | POA: Diagnosis not present

## 2022-11-10 DIAGNOSIS — R319 Hematuria, unspecified: Secondary | ICD-10-CM | POA: Diagnosis not present

## 2022-11-13 DIAGNOSIS — I4891 Unspecified atrial fibrillation: Secondary | ICD-10-CM | POA: Diagnosis not present

## 2022-11-13 DIAGNOSIS — Z79899 Other long term (current) drug therapy: Secondary | ICD-10-CM | POA: Diagnosis not present

## 2022-11-21 ENCOUNTER — Ambulatory Visit (HOSPITAL_COMMUNITY)
Admission: RE | Admit: 2022-11-21 | Discharge: 2022-11-21 | Disposition: A | Discharge status: Home / Self Care | Payer: PPO | Source: Ambulatory Visit | Attending: Urology | Admitting: Urology

## 2022-11-21 DIAGNOSIS — N200 Calculus of kidney: Secondary | ICD-10-CM | POA: Diagnosis not present

## 2022-11-21 DIAGNOSIS — N390 Urinary tract infection, site not specified: Secondary | ICD-10-CM | POA: Diagnosis not present

## 2022-11-22 ENCOUNTER — Telehealth: Payer: Self-pay

## 2022-11-22 ENCOUNTER — Ambulatory Visit (INDEPENDENT_AMBULATORY_CARE_PROVIDER_SITE_OTHER): Payer: PPO | Admitting: Urology

## 2022-11-22 ENCOUNTER — Other Ambulatory Visit: Payer: Self-pay

## 2022-11-22 DIAGNOSIS — R310 Gross hematuria: Secondary | ICD-10-CM | POA: Diagnosis not present

## 2022-11-22 DIAGNOSIS — N390 Urinary tract infection, site not specified: Secondary | ICD-10-CM

## 2022-11-22 DIAGNOSIS — R300 Dysuria: Secondary | ICD-10-CM | POA: Diagnosis not present

## 2022-11-22 NOTE — Telephone Encounter (Signed)
Patient is aware [place] didn't show obstruction and she need a UA and culture before starting treatment and she get [place] a specimen today. Patient voiced understanding

## 2022-11-22 NOTE — Progress Notes (Signed)
Open in error

## 2022-11-22 NOTE — Progress Notes (Cosign Needed Addendum)
Patient presents today for dysuria and gross hematuria.  Urine sent for ua and culture.  Dr. Alyson Ingles reviewed results, no treatment needed at this time will wait for culture.

## 2022-11-22 NOTE — Telephone Encounter (Signed)
Patient states she is feeling bad with back pain and gross hematuria.  Patient had renal US done 11/20/22. Is there anything you can send patient?

## 2022-11-23 LAB — MICROSCOPIC EXAMINATION
Bacteria, UA: NONE SEEN
RBC, Urine: >30 /hpf — AB (ref 0–2)

## 2022-11-23 LAB — URINALYSIS, ROUTINE W REFLEX MICROSCOPIC
Bilirubin, UA: NEGATIVE
Glucose, UA: NEGATIVE
Nitrite, UA: NEGATIVE
Specific Gravity, UA: 1.015 (ref 1.005–1.030)
Urobilinogen, Ur: 0.2 mg/dL (ref 0.2–1.0)
pH, UA: 6 (ref 5.0–7.5)

## 2022-11-24 ENCOUNTER — Telehealth: Payer: Self-pay

## 2022-11-24 LAB — URINE CULTURE: Organism ID, Bacteria: NO GROWTH

## 2022-11-24 NOTE — Progress Notes (Signed)
GI Office Note    Referring Provider: Manon Hilding, MD Primary Care Physician:  Manon Hilding, MD  Primary Gastroenterologist: Elon Alas. Abbey Chatters, DO   Chief Complaint   No chief complaint on file.   History of Present Illness   Tanya Mueller is a 88 y.o. female presenting today for follow up. Last seen in 07/2022. H/o chronic constipation.   Previously failed Linzess 237mg daily. Did not tolerate Movantik due to jitteriness. Improved on Amitiza 242m bid.         Medications   Current Outpatient Medications  Medication Sig Dispense Refill   acetaminophen (TYLENOL) 325 MG tablet Take 2 tablets (650 mg total) by mouth every 6 (six) hours as needed for mild pain (or Fever >/= 101). 30 tablet 0   amiodarone (PACERONE) 200 MG tablet Take 200 mg by mouth daily.     amLODipine (NORVASC) 2.5 MG tablet Take 1 tablet (2.5 mg total) by mouth daily. 90 tablet 3   apixaban (ELIQUIS) 5 MG TABS tablet Take 1 tablet (5 mg total) by mouth 2 (two) times daily. 60 tablet 5   fluticasone (FLONASE) 50 MCG/ACT nasal spray Place 2 sprays into the nose daily as needed for allergies.      losartan (COZAAR) 50 MG tablet Take 1 tablet (50 mg total) by mouth daily. 90 tablet 3   lubiprostone (AMITIZA) 24 MCG capsule Take 1 capsule (24 mcg total) by mouth 2 (two) times daily with a meal. 60 capsule 2   metoprolol tartrate (LOPRESSOR) 50 MG tablet Take 1.5 tablets (75 mg total) by mouth 2 (two) times daily. 270 tablet 3   ondansetron (ZOFRAN) 4 MG tablet Take 1 tablet (4 mg total) by mouth every 8 (eight) hours as needed for nausea or vomiting. 30 tablet 2   oxyCODONE-acetaminophen (PERCOCET) 10-325 MG tablet Take 1 tablet by mouth 4 (four) times daily as needed for pain.     polyethylene glycol (MIRALAX / GLYCOLAX) 17 g packet Take 17 g by mouth daily as needed for moderate constipation.     sertraline (ZOLOFT) 25 MG tablet Take 25 mg by mouth daily.     No current facility-administered  medications for this visit.    Allergies   Allergies as of 11/27/2022 - Review Complete 11/22/2022  Allergen Reaction Noted   Morphine       Past Medical History   Past Medical History:  Diagnosis Date   Allergic rhinitis    Arthritis    Back ,hips   [place]   Complication of anesthesia    awareness during general anesthesia.   Dysrhythmia    A-Fib and PSVT   History of atrial fibrillation    episode 2013 converted with cardizem-- happened at time acute illness due to kidney stone --  resolved  no issues since   History of kidney stones    History of sepsis    urosepsis  2013 due to kidney stone   Hypertension    Left nephrolithiasis    Left ureteral calculus    Neuromuscular disorder (HCC)    PAC (premature atrial contraction)    Solitary lung nodule    benign right middle lobe per CT    Past Surgical History   Past Surgical History:  Procedure Laterality Date   BACK SURGERY  2015   rod and screws   CATARACT EXTRACTION W/ INTRAOCULAR LENS  IMPLANT, BILATERAL  10/02/2008   CYSTOSCOPY WITH BIOPSY N/A 04/08/2019  Procedure: CYSTOSCOPY WITH bladder BIOPSY, FULGURATION;  Surgeon: Festus Aloe, MD;  Location: [facility];  Service: Urology;  Laterality: N/A;   CYSTOSCOPY WITH STENT PLACEMENT Left 09/15/2014   Procedure: CYSTOSCOPY WITH STENT PLACEMENT;  Surgeon: Georgette Dover, MD;  Location: [facility];  Service: Urology;  Laterality: Left;   CYSTOSCOPY/RETROGRADE/URETEROSCOPY Left 08/09/2014   Procedure: CYSTOSCOPY/RETROGRADE/LEFT STENT;  Surgeon: Bernestine Amass, MD;  Location: [facility];  Service: Urology;  Laterality: Left;   CYSTOSCOPY/URETEROSCOPY/HOLMIUM LASER/STENT PLACEMENT Left 09/15/2022   Procedure: CYSTOSCOPY LEFT URETEROSCOPY/STONE BASKET EXTRACTION;  Surgeon: Irine Seal, MD;  Location: [facility];  Service: Urology;  Laterality: Left;  1 HR FOR CASE   EXTRACORPOREAL SHOCK WAVE LITHOTRIPSY  10/03/2011   HOLMIUM LASER APPLICATION Left  [id number]   Procedure: HOLMIUM LASER APPLICATION;  Surgeon: Georgette Dover, MD;  [facility];  Service: Urology;  Laterality: Left;   IR URETERAL STENT LEFT NEW ACCESS W/O SEP NEPHROSTOMY CATH  08/18/2022   LAPAROTOMY W/  UNILATERAL SALPINGOOPHORECTOMY  age 71   NEPHROLITHOTOMY Left 08/18/2022   Procedure: LEFT NEPHROLITHOTOMY PERCUTANEOUS;  Surgeon: Irine Seal, MD;  Location: [facility];  Service: Urology;  Laterality: Left;  2.5 HRS FOR CASE   TOTAL ABDOMINAL HYSTERECTOMY  age 52   w/ unilateral salpingoophorectomy   TRANSTHORACIC ECHOCARDIOGRAM  02/13/2012   mild LVH/  ef 65%/  trivial MR  &  PR/  mild RVE   URETEROSCOPY Left 09/15/2014   Procedure: URETEROSCOPY;  Surgeon: Georgette Dover, MD;  Location: [facility];  Service: Urology;  Laterality: Left;    Past Family History   Family History  Problem Relation Age of Onset   Cancer Other        Lung and bladder   Cancer Other        "Bone marrow cancer"    Past Social History   Social History   Socioeconomic History   Marital status: Married    Spouse name: Not on file   Number of children: 1   Years of education: Not on file   Highest education level: Not on file  Occupational History    Employer: RETIRED  Tobacco Use   Smoking status: Never    Passive exposure: Never   Smokeless tobacco: Never  Vaping Use   Vaping Use: Never used  Substance and Sexual Activity   Alcohol use: No   Drug use: No   Sexual activity: Not on file  Other Topics Concern   Not on file  Social History Narrative   Not on file   Social Determinants of Health   Financial Resource Strain: Not on file  Food Insecurity: No Food Insecurity (09/16/2022)   Hunger Vital Sign    Worried About Running Out of Food in the Last Year: Never true    Ran Out of Food in the Last Year: Never true  Transportation Needs: No Transportation Needs (09/16/2022)   PRAPARE - Radiographer, therapeutic (Medical): No    Lack of Transportation (Non-Medical): No  Physical Activity: Not on file  Stress: Not on file  Social Connections: Not on file  Intimate Partner Violence: Not At Risk (09/16/2022)   Humiliation, Afraid, Rape, and Kick questionnaire    Fear of Current or Ex-Partner: No    Emotionally Abused: No    Physically Abused: No    Sexually Abused: No    Review of Systems   General: Negative for anorexia, weight loss, fever,  chills, fatigue, weakness. ENT: Negative for hoarseness, difficulty swallowing , nasal congestion. CV: Negative for chest pain, angina, palpitations, dyspnea on exertion, peripheral edema.  Respiratory: Negative for dyspnea at rest, dyspnea on exertion, cough, sputum, wheezing.  GI: See history of present illness. GU:  Negative for dysuria, hematuria, urinary incontinence, urinary frequency, nocturnal urination.  Endo: Negative for unusual weight change.     Physical Exam   There were no vitals taken for this visit.   General: Well-nourished, well-developed in no acute distress.  Eyes: No icterus. Mouth: Oropharyngeal mucosa moist and pink , no lesions erythema or exudate. Lungs: Clear to auscultation bilaterally.  Heart: Regular rate and rhythm, no murmurs rubs or gallops.  Abdomen: Bowel sounds are normal, nontender, nondistended, no hepatosplenomegaly or masses,  no abdominal bruits or hernia , no rebound or guarding.  Rectal: ***  Extremities: No lower extremity edema. No clubbing or deformities. Neuro: Alert and oriented x 4   Skin: Warm and dry, no jaundice.   Psych: Alert and cooperative, normal mood and affect.  Labs   *** Imaging Studies   Ultrasound renal complete  Result Date: 11/22/2022 CLINICAL DATA:  History of left kidney stones.  Recurrent UTI. EXAM: RENAL / URINARY TRACT ULTRASOUND COMPLETE COMPARISON:  None Available. FINDINGS: Right Kidney: Renal measurements: 9.7 x 5.4 x 4.4 cm = volume: 121.2 mL.  Echogenicity within normal limits. No mass or hydronephrosis visualized. Left Kidney: Renal measurements: 10.3 x 5.2 x 4.4 cm = volume: 123 mL. Echogenicity within normal limits. No mass or hydronephrosis visualized. Bladder: Appears normal for degree of bladder distention. Other: None. IMPRESSION: Normal study. Electronically Signed   By: Dorise Bullion III M.D.   On: 11/22/2022 12:42    Assessment       PLAN   ***   Laureen Ochs. Bobby Rumpf, [place], [facility]

## 2022-11-24 NOTE — Telephone Encounter (Signed)
Patient call to follow up on her urine culture. Patient is aware once culture come in and reviewed bey Dr. Alyson Ingles someone will reach out to her with the finding. Patient voiced understanding

## 2022-11-27 ENCOUNTER — Ambulatory Visit (INDEPENDENT_AMBULATORY_CARE_PROVIDER_SITE_OTHER): Payer: PPO | Admitting: Gastroenterology

## 2022-11-27 ENCOUNTER — Encounter: Payer: Self-pay | Admitting: Gastroenterology

## 2022-11-27 VITALS — BP 138/78 | HR 66 | Temp 98.0°F | Ht 66.0 in | Wt 156.0 lb

## 2022-11-27 DIAGNOSIS — K5903 Drug induced constipation: Secondary | ICD-10-CM | POA: Diagnosis not present

## 2022-11-27 DIAGNOSIS — T402X5A Adverse effect of other opioids, initial encounter: Secondary | ICD-10-CM

## 2022-11-27 NOTE — Patient Instructions (Signed)
Please start taking Miralax one capful every day.  Continue Amitiza 49mg twice a day for now. Monitor your bowel movements, goal of having soft stool at least every other day. If after being on Miralax for 2 weeks, you find that your bowel function is good, let's have you cut back on your Amitiza to once daily and continue Miralax once daily.  Please call in 2-3 weeks with a progress report, at [phone] If Tammy does not pick up, leave her a message, she will return your call.

## 2022-11-27 NOTE — Progress Notes (Signed)
Letter sent.

## 2022-11-28 NOTE — Telephone Encounter (Signed)
Daughter/Patient is aware that her UA and culture  was both negative for infection. Daughter states that mom is experience some difficult with urination, lower back pain, and groin pain sometimes. Made daughter aware that I will a message to the MD and someone will follow up. Daughter voiced understanding

## 2022-11-29 NOTE — Telephone Encounter (Signed)
Daughter is aware that her mom is scheduled to see Dr. Jeffie Pollock tomorrow. Daughter voiced understanding

## 2022-11-29 NOTE — Telephone Encounter (Signed)
Tried calling with no answer, will F/U

## 2022-11-30 ENCOUNTER — Ambulatory Visit (INDEPENDENT_AMBULATORY_CARE_PROVIDER_SITE_OTHER): Payer: PPO | Admitting: Urology

## 2022-11-30 VITALS — BP 176/99 | HR 88 | Ht 66.0 in | Wt 155.0 lb

## 2022-11-30 DIAGNOSIS — R109 Unspecified abdominal pain: Secondary | ICD-10-CM

## 2022-11-30 DIAGNOSIS — Z87440 Personal history of urinary (tract) infections: Secondary | ICD-10-CM | POA: Diagnosis not present

## 2022-11-30 DIAGNOSIS — R3129 Other microscopic hematuria: Secondary | ICD-10-CM | POA: Diagnosis not present

## 2022-11-30 DIAGNOSIS — R339 Retention of urine, unspecified: Secondary | ICD-10-CM

## 2022-11-30 DIAGNOSIS — Z87442 Personal history of urinary calculi: Secondary | ICD-10-CM

## 2022-11-30 NOTE — Progress Notes (Signed)
Subjective: 1. Incomplete emptying of bladder   2. Flank pain   3. History of nephrolithiasis   4. Microhematuria   5. Personal history of urinary infection      11/30/22: Tanya Mueller returns today in f/u.  She had a left PCNL and left URS late last year.  She had hemorrhagic cystitis on 2/5 and took cipro and then amoxicillin once the culture was back.   She had a renal US on 11/21/22 and that showed no hydro or stones.  Her PVR is 22m today and her UA just has 3-10 RBC's.  She has persistent left flank pain.   She has some vaginal irritation.  She has some malaise as well.  She is on chronic oxycodone q6hrs for chronic back pain.    09/21/22: Tanya Mueller returns today in f/u.  She had a left PCNL and subsequent left URS and the stent has been removed.  She has some mild back pain.  She has no dysuria.  She had some increased nocturia last night.  Her UA has 11-30 RBC's. She had to come back to the hospital with a-fib on the evening of her last procedure on 09/15/22 and her HR remains variable.    Gu hx; RTajanais an 88yo female who is a [place]  She had follicular cystitis on a biopsy in 7/20 and has a history of stones with multiple prior procedures.  She has a history of OAB and has failed vesicare and myrbetriq.   She last had a CT in in 2/23 and had a 2.3cm laminated stone in the left renal pelvis and smaller right renal stones.  There was a 171mstone in the [facility] on a CT in 2020.   She has a chronic UTI that are becoming more frequency and she will generally benefit from a course of antibiotics.  The last oral antibiotic didn't help and needed a injection. She was in the ER on 05/05/22 with constipation.  A culture that day was negative.  She currently complains of malodorous urine over the last 2 weeks with malaise.  She has no dysuria.   She has some frequency and urgency.  She has not had recent hematuria.   She has had multilevel back surgery with fusion and her back pain  gets worse with the infections, left > right.  Her UA looks infected.    ROS:  Review of Systems  Constitutional:  Positive for malaise/fatigue.  Respiratory:  Positive for shortness of breath.   Cardiovascular:  Positive for leg swelling.  Gastrointestinal:  Positive for nausea.  Genitourinary:  Positive for frequency.  Musculoskeletal:  Positive for back pain.  All other systems reviewed and are negative.   Allergies  Allergen Reactions   Morphine     Nervous    Past Medical History:  Diagnosis Date   Allergic rhinitis    Arthritis    Back ,hips   Cancer ([place]   Complication of anesthesia    awareness during general anesthesia.   Dysrhythmia    A-Fib and PSVT   History of atrial fibrillation    episode 2013 converted with cardizem-- happened at time acute illness due to kidney stone --  resolved  no issues since   History of kidney stones    History of sepsis    urosepsis  2013 due to kidney stone   Hypertension    Left nephrolithiasis    Left ureteral calculus    Neuromuscular  disorder ([place])    PAC (premature atrial contraction)    Solitary lung nodule    benign right middle lobe per CT    Past Surgical History:  Procedure Laterality Date   BACK SURGERY  2015   rod and screws   CATARACT EXTRACTION W/ INTRAOCULAR LENS  IMPLANT, BILATERAL  10/02/2008   CYSTOSCOPY WITH BIOPSY N/A 04/08/2019   Procedure: CYSTOSCOPY WITH bladder BIOPSY, FULGURATION;  Surgeon: Festus Aloe, MD;  Location: [facility];  Service: Urology;  Laterality: N/A;   CYSTOSCOPY WITH STENT PLACEMENT Left 09/15/2014   Procedure: CYSTOSCOPY WITH STENT PLACEMENT;  Surgeon: Georgette Dover, MD;  Location[facility];  Service: Urology;  Laterality: Left;   CYSTOSCOPY/RETROGRADE/URETEROSCOPY Left 08/09/2014   Procedure: CYSTOSCOPY/RETROGRADE/LEFT STENT;  Surgeon: Bernestine Amass, MD;  Location: [facility];  Service: Urology;  Laterality: Left;   CYSTOSCOPY/URETEROSCOPY/HOLMIUM  LASER/STENT PLACEMENT Left 09/15/2022   Procedure: CYSTOSCOPY LEFT URETEROSCOPY/STONE BASKET EXTRACTION;  Surgeon: Irine Seal, MD;  Location: [facility];  Service: Urology;  Laterality: Left;  1 HR FOR CASE   EXTRACORPOREAL SHOCK WAVE LITHOTRIPSY  10/03/2011   HOLMIUM LASER APPLICATION Left [id number]   Procedure: HOLMIUM LASER APPLICATION;  Surgeon: Georgette Dover, MD;  Location: [facility];  Service: Urology;  Laterality: Left;   IR URETERAL STENT LEFT NEW ACCESS W/O SEP NEPHROSTOMY CATH  08/18/2022   LAPAROTOMY W/  UNILATERAL SALPINGOOPHORECTOMY  age 53   NEPHROLITHOTOMY Left 08/18/2022   Procedure: LEFT NEPHROLITHOTOMY PERCUTANEOUS;  Surgeon: Irine Seal, MD;  Location: [facility];  Service: Urology;  Laterality: Left;  2.5 HRS FOR CASE   TOTAL ABDOMINAL HYSTERECTOMY  age 44   w/ unilateral salpingoophorectomy   TRANSTHORACIC ECHOCARDIOGRAM  02/13/2012   mild LVH/  ef 65%/  trivial MR  &  PR/  mild RVE   URETEROSCOPY Left 09/15/2014   Procedure: URETEROSCOPY;  Surgeon: Georgette Dover, MD;  Location: [facility];  Service: Urology;  Laterality: Left;    Social History   Socioeconomic History   Marital status: Married    Spouse name: Not on file   Number of children: 1   Years of education: Not on file   Highest education level: Not on file  Occupational History    Employer: RETIRED  Tobacco Use   Smoking status: Never    Passive exposure: Never   Smokeless tobacco: Never  Vaping Use   Vaping Use: Never used  Substance and Sexual Activity   Alcohol use: No   Drug use: No   Sexual activity: Not Currently  Other Topics Concern   Not on file  Social History Narrative   Not on file   Social Determinants of Health   Financial Resource Strain: Not on file  Food Insecurity: No Food Insecurity (09/16/2022)   Hunger Vital Sign    Worried About Running Out of Food in the Last Year: Never true    Ran Out of Food in the Last Year: Never true   Transportation Needs: No Transportation Needs (09/16/2022)   PRAPARE - Hydrologist (Medical): No    Lack of Transportation (Non-Medical): No  Physical Activity: Not on file  Stress: Not on file  Social Connections: Not on file  Intimate Partner Violence: Not At Risk (09/16/2022)   Humiliation, Afraid, Rape, and Kick questionnaire    Fear of Current or Ex-Partner: No    Emotionally Abused: No    Physically Abused: No    Sexually Abused:  No    Family History  Problem Relation Age of Onset   Cancer Other        Lung and bladder   Cancer Other        "Bone marrow cancer"    Anti-infectives: Anti-infectives (From admission, onward)    None       Current Outpatient Medications  Medication Sig Dispense Refill   acetaminophen (TYLENOL) 325 MG tablet Take 2 tablets (650 mg total) by mouth every 6 (six) hours as needed for mild pain (or Fever >/= 101). 30 tablet 0   amiodarone (PACERONE) 200 MG tablet Take 200 mg by mouth daily.     amLODipine (NORVASC) 2.5 MG tablet Take 1 tablet (2.5 mg total) by mouth daily. 90 tablet 3   apixaban (ELIQUIS) 5 MG TABS tablet Take 1 tablet (5 mg total) by mouth 2 (two) times daily. 60 tablet 5   fluticasone (FLONASE) 50 MCG/ACT nasal spray Place 2 sprays into the nose daily as needed for allergies.      losartan (COZAAR) 50 MG tablet Take 1 tablet (50 mg total) by mouth daily. 90 tablet 3   lubiprostone (AMITIZA) 24 MCG capsule Take 1 capsule (24 mcg total) by mouth 2 (two) times daily with a meal. 60 capsule 2   metoprolol tartrate (LOPRESSOR) 50 MG tablet Take 1.5 tablets (75 mg total) by mouth 2 (two) times daily. 270 tablet 3   ondansetron (ZOFRAN) 4 MG tablet Take 1 tablet (4 mg total) by mouth every 8 (eight) hours as needed for nausea or vomiting. 30 tablet 2   oxyCODONE-acetaminophen (PERCOCET) 10-325 MG tablet Take 1 tablet by mouth 4 (four) times daily as needed for pain.     polyethylene glycol (MIRALAX /  GLYCOLAX) 17 g packet Take 17 g by mouth daily as needed for moderate constipation.     sertraline (ZOLOFT) 25 MG tablet Take 25 mg by mouth daily.     No current facility-administered medications for this visit.     Objective: Vital signs in last 24 hours: BP (!) 176/99   Pulse 88   Ht '5\' 6"'$  (1.676 m)   Wt 155 lb (70.3 kg)   BMI 25.02 kg/m   Intake/Output from previous day: No intake/output data recorded. Intake/Output this shift: '@IOTHISSHIFT'$ @   Physical Exam Vitals reviewed.  Constitutional:      Appearance: Normal appearance.  Abdominal:     Tenderness: There is left CVA tenderness (mild with some LLQ tenderness.). There is no right CVA tenderness.  Neurological:     Mental Status: She is alert.     Lab Results:  Results for orders placed or performed in visit on 11/30/22 (from the past 24 hour(s))  Urinalysis, Routine w reflex microscopic     Status: Abnormal   Collection Time: 11/30/22  1:36 PM  Result Value Ref Range   Specific Gravity, UA 1.015 1.005 - 1.030   pH, UA 7.5 5.0 - 7.5   Color, UA Yellow Yellow   Appearance Ur Hazy (A) Clear   Leukocytes,UA Trace (A) Negative   Protein,UA Negative Negative/Trace   Glucose, UA Negative Negative   Ketones, UA Negative Negative   RBC, UA 1+ (A) Negative   Bilirubin, UA Negative Negative   Urobilinogen, Ur 1.0 0.2 - 1.0 mg/dL   Nitrite, UA Negative Negative   Microscopic Examination See below:    Narrative   Performed at:  [facility] [address], [place], [place]  [id number] Lab Director: Dory Larsen  [place], Phone:  [phone]  Microscopic Examination     Status: Abnormal   Collection Time: 11/30/22  1:36 PM   Urine  Result Value Ref Range   WBC, UA 0-5 0 - 5 /hpf   RBC, Urine 3-10 (A) 0 - 2 /hpf   Epithelial Cells (non renal) 0-10 0 - 10 /hpf   Bacteria, UA None seen None seen/Few   Narrative   Performed at:  [facility] [address], [place], [place]   [id number] Lab Director: [place], Phone:  [phone]      BMET No results for input(s): "NA", "K", "CL", "CO2", "GLUCOSE", "BUN", "CREATININE", "CALCIUM" in the last 72 hours. PT/INR No results for input(s): "LABPROT", "INR" in the last 72 hours. ABG No results for input(s): "PHART", "HCO3" in the last 72 hours.  Invalid input(s): "PCO2", "PO2" UA has 11-30 RBC's.  Studies/Results: No results found. Ultrasound renal complete  Result Date: 11/22/2022 CLINICAL DATA:  History of left kidney stones.  Recurrent UTI. EXAM: RENAL / URINARY TRACT ULTRASOUND COMPLETE COMPARISON:  None Available. FINDINGS: Right Kidney: Renal measurements: 9.7 x 5.4 x 4.4 cm = volume: 121.2 mL. Echogenicity within normal limits. No mass or hydronephrosis visualized. Left Kidney: Renal measurements: 10.3 x 5.2 x 4.4 cm = volume: 123 mL. Echogenicity within normal limits. No mass or hydronephrosis visualized. Bladder: Appears normal for degree of bladder distention. Other: None. IMPRESSION: Normal study. Electronically Signed   By: Dorise Bullion III M.D.   On: 11/22/2022 12:42   ECHOCARDIOGRAM COMPLETE  Result Date: 09/16/2022    ECHOCARDIOGRAM REPORT   Patient Name:   Tanya Mueller Date of Exam: 09/16/2022 Medical Rec #:  [id number]     Height:       66.0 in Accession #:    [id number]    Weight:       158.3 lb Date of Birth:  [birth year]     BSA:          1.811 m Patient Age:    24 years      BP:           166/98 mmHg Patient Gender: F             HR:           86 bpm. Exam Location:  [facility] Procedure: 2D Echo, Color Doppler and Cardiac Doppler Indications:    [id number] Acute systolic (congestive) heart failure  History:        Patient has prior history of Echocardiogram examinations, most                 recent 04/29/2017. Arrythmias:Atrial Fibrillation; Risk                 Factors:Hypertension.  Sonographer:    Raquel Sarna Senior RDCS Referring Phys: [id number] OLADAPO ADEFESO  Sonographer Comments: Suboptimal  parasternal window IMPRESSIONS  1. Left ventricular ejection fraction, by estimation, is 55 to 60%. The left ventricle has normal function. The left ventricle has no regional wall motion abnormalities. Left ventricular diastolic parameters are indeterminate.  2. Right ventricular systolic function is normal. The right ventricular size is moderately enlarged.  3. Left atrial size was mildly dilated.  4. The mitral valve is normal in structure. Mild mitral valve regurgitation. No evidence of mitral stenosis.  5. The aortic valve was not well visualized. Aortic valve regurgitation is not visualized. No aortic stenosis is present.  6. The inferior vena cava is normal in  size with <50% respiratory variability, suggesting right atrial pressure of 8 mmHg. Comparison(s): Similar to 2018 Report. FINDINGS  Left Ventricle: Left ventricular ejection fraction, by estimation, is 55 to 60%. The left ventricle has normal function. The left ventricle has no regional wall motion abnormalities. The left ventricular internal cavity size was normal in size. There is  no left ventricular hypertrophy. Left ventricular diastolic parameters are indeterminate. Right Ventricle: The right ventricular size is moderately enlarged. No increase in right ventricular wall thickness. Right ventricular systolic function is normal. Left Atrium: Left atrial size was mildly dilated. Right Atrium: Right atrial size was normal in size. Pericardium: There is no evidence of pericardial effusion. Mitral Valve: The mitral valve is normal in structure. Mild mitral valve regurgitation. No evidence of mitral valve stenosis. Tricuspid Valve: The tricuspid valve is not well visualized. Tricuspid valve regurgitation is trivial. No evidence of tricuspid stenosis. Aortic Valve: The aortic valve was not well visualized. Aortic valve regurgitation is not visualized. No aortic stenosis is present. Pulmonic Valve: The pulmonic valve was not well visualized. Pulmonic valve  regurgitation is not visualized. Aorta: The ascending aorta was not well visualized and the aortic root is normal in size and structure. Venous: The inferior vena cava is normal in size with less than 50% respiratory variability, suggesting right atrial pressure of 8 mmHg. IAS/Shunts: No atrial level shunt detected by color flow Doppler.  LEFT VENTRICLE PLAX 2D LVIDd:         4.30 cm LVIDs:         3.30 cm LV PW:         0.90 cm LV IVS:        0.80 cm LVOT diam:     1.90 cm LV SV:         40 LV SV Index:   22 LVOT Area:     2.84 cm  RIGHT VENTRICLE RV S prime:     11.00 cm/s TAPSE (M-mode): 1.8 cm LEFT ATRIUM             Index        RIGHT ATRIUM           Index LA diam:        4.60 cm 2.54 cm/m   RA Area:     18.60 cm LA Vol (A2C):   61.4 ml 33.91 ml/m  RA Volume:   50.60 ml  27.95 ml/m LA Vol (A4C):   69.5 ml 38.39 ml/m LA Biplane Vol: 64.4 ml 35.57 ml/m  AORTIC VALVE LVOT Vmax:   71.80 cm/s LVOT Vmean:  54.200 cm/s LVOT VTI:    0.140 m  AORTA Ao Root diam: 3.10 cm  SHUNTS Systemic VTI:  0.14 m Systemic Diam: 1.90 cm Rudean Haskell MD Electronically signed by Rudean Haskell MD Signature Date/Time: 09/16/2022/1:09:51 PM    Final    DG Chest Port 1 View  Result Date: 09/15/2022 CLINICAL DATA:  Palpitations EXAM: PORTABLE CHEST 1 VIEW COMPARISON:  Chest x-ray 04/28/2017 FINDINGS: Heart is mildly enlarged. There are increased central interstitial markings. There are small bilateral pleural effusions. No pneumothorax or acute fracture. IMPRESSION: Mild cardiomegaly and pulmonary edema. Small bilateral pleural effusions. Electronically Signed   By: Ronney Asters M.D.   On: 09/15/2022 18:17   DG C-Arm 1-60 Min-No Report  Result Date: 09/15/2022 Fluoroscopy was utilized by the requesting physician.  No radiographic interpretation.     Assessment/Plan: Left flank and abdominal pain with some malaise but no hydro or other  findings to suggest a recurrent stone.  The pain is positional and may  be musculoskeletal.  She has had home PT in the past and they will try to re-engage that service.  She will see me in 3 months.   Chronic cystitis.   UA just has  microhematuria.    Sensation of incomplete emptying.   PVR is 46m.  If her symptoms persist at f/u a repeat CT might be worthwhile.   No orders of the defined types were placed in this encounter.    Orders Placed This Encounter  Procedures   Microscopic Examination   Urinalysis, Routine w reflex microscopic   BLADDER SCAN AMB NON-IMAGING     Return in about 3 months (around 02/28/2023).    CC: Dr. PConsuello Masse      JIrine Seal3/10/2022

## 2022-12-01 ENCOUNTER — Encounter: Payer: Self-pay | Admitting: Urology

## 2022-12-01 DIAGNOSIS — M48061 Spinal stenosis, lumbar region without neurogenic claudication: Secondary | ICD-10-CM | POA: Diagnosis not present

## 2022-12-01 DIAGNOSIS — R5381 Other malaise: Secondary | ICD-10-CM | POA: Diagnosis not present

## 2022-12-01 DIAGNOSIS — M419 Scoliosis, unspecified: Secondary | ICD-10-CM | POA: Diagnosis not present

## 2022-12-01 DIAGNOSIS — M25552 Pain in left hip: Secondary | ICD-10-CM | POA: Diagnosis not present

## 2022-12-01 DIAGNOSIS — R030 Elevated blood-pressure reading, without diagnosis of hypertension: Secondary | ICD-10-CM | POA: Diagnosis not present

## 2022-12-01 DIAGNOSIS — M545 Low back pain, unspecified: Secondary | ICD-10-CM | POA: Diagnosis not present

## 2022-12-01 DIAGNOSIS — Z6824 Body mass index (BMI) 24.0-24.9, adult: Secondary | ICD-10-CM | POA: Diagnosis not present

## 2022-12-01 DIAGNOSIS — Z20828 Contact with and (suspected) exposure to other viral communicable diseases: Secondary | ICD-10-CM | POA: Diagnosis not present

## 2022-12-01 LAB — MICROSCOPIC EXAMINATION: Bacteria, UA: NONE SEEN

## 2022-12-01 LAB — URINALYSIS, ROUTINE W REFLEX MICROSCOPIC
Bilirubin, UA: NEGATIVE
Glucose, UA: NEGATIVE
Ketones, UA: NEGATIVE
Nitrite, UA: NEGATIVE
Protein,UA: NEGATIVE
Specific Gravity, UA: 1.015 (ref 1.005–1.030)
Urobilinogen, Ur: 1 mg/dL (ref 0.2–1.0)
pH, UA: 7.5 (ref 5.0–7.5)

## 2022-12-04 ENCOUNTER — Telehealth: Payer: Self-pay

## 2022-12-04 NOTE — Telephone Encounter (Signed)
Pt's daughter Rinda called with an update. Daughter states that it is sooner than planned. Pt is stating that she feels sick all the time. Pt is still having lower left side pain and feels like she is choking when taking meds. Pt's daughter states that the miralax and Baker Pierini is working for her bowels. Daughter is aware that you are out of the office today and that this message will be addressed tomorrow.

## 2022-12-05 ENCOUNTER — Telehealth: Payer: Self-pay | Admitting: Internal Medicine

## 2022-12-05 NOTE — Telephone Encounter (Signed)
Pt c/o Shortness Of Breath: STAT if SOB developed within the last 24 hours or pt is noticeably SOB on the phone  1. Are you currently SOB (can you hear that pt is SOB on the phone)?  Daughter is not currently with the patient  2. How long have you been experiencing SOB?  Over the past 1 past week   3. Are you SOB when sitting or when up moving around?  When up and moving around  4. Are you currently experiencing any other symptoms?  Patient's daughter states "patient just feels really bad as if she has the flu"

## 2022-12-05 NOTE — Telephone Encounter (Signed)
Daughter called stating that patient has had intermittent sob and weakness x 1 week. Denies chest pains and dizziness. States that she is having some pain in her left side that has been going on for about a month but does not think that it is heart related. Saw her pcp last week because she was feeling so bad  but was told that it could possibly be coming from her back aches and advised physical therapy. Daughter states that she would like for patient to be seen just to make sure this is nothing heart related. Appointment scheduled with Dr. Dellia Cloud on Wednesday, 12/06/22 @ 10:00. Please advise

## 2022-12-05 NOTE — Telephone Encounter (Signed)
Patient made aware.

## 2022-12-06 ENCOUNTER — Ambulatory Visit: Payer: PPO | Attending: Internal Medicine | Admitting: Internal Medicine

## 2022-12-06 ENCOUNTER — Encounter: Payer: Self-pay | Admitting: Internal Medicine

## 2022-12-06 VITALS — BP 146/84 | HR 93 | Ht 65.0 in | Wt 152.6 lb

## 2022-12-06 DIAGNOSIS — I4892 Unspecified atrial flutter: Secondary | ICD-10-CM | POA: Diagnosis not present

## 2022-12-06 DIAGNOSIS — I4891 Unspecified atrial fibrillation: Secondary | ICD-10-CM

## 2022-12-06 MED ORDER — FUROSEMIDE 20 MG PO TABS: 20.0000 mg | ORAL_TABLET | Freq: Every day | ORAL | 3 refills | Status: DC | PRN

## 2022-12-06 NOTE — Progress Notes (Signed)
Cardiology Office Note  Date: 12/06/2022   ID: Tanya Mueller, DOB December 17, 1934, MRN [id number]  PCP:  Manon Hilding, MD  Cardiologist:  Chalmers Guest, MD Electrophysiologist:  None   Reason for Office Visit: Follow-up of A-fib  History of Present Illness: Tanya Mueller is a 88 y.o. female known to have persistent A-fib/new onset atrial flutter, HTN presented to cardiology clinic for follow-up visit.   Patient was previously followed by Dr. Bronson Ing for palpitations for which she had a event monitor in 2018 that showed symptomatic PACs and echo showed normal LVEF and no valve abnormalities. She was eventually diagnosed with atrial fibrillation on EKG and Apple Watch EKG strips as well in 09/2022 after which patient was started on metoprolol tartrate 50 mg twice daily. She underwent a cystoscopy in the second week of December [id number] with no complications but upon discharge from the hospital, she came right back to ER with A-fib with RVR, on diltiazem drip, stabilized and increased dose of metoprolol tartrate from 50 mg to 75 mg twice daily upon discharge. Coumadin was also switched to Eliquis. In 10/2022, she continues to have palpitations with heart rate ranging between 70 and 100s. Amiodarone was started with controlled heart rates but patient recently started to have SOB symptoms (she had to catch for air at rest but was able to sleep better at nighttime, notices SOB symptoms with moving around during the daytime).  EKG since 11/2022 showed new onset atrial flutter.  Has occasional palpitations, denies angina, dizziness, lightheadedness and syncope. Has some ankle swelling.  Past Medical History:  Diagnosis Date   Allergic rhinitis    Arthritis    Back ,hips   Cancer ([place])    Complication of anesthesia    awareness during general anesthesia.   Dysrhythmia    A-Fib and PSVT   History of atrial fibrillation    episode 2013 converted with cardizem-- happened at time acute illness due to  kidney stone --  resolved  no issues since   History of kidney stones    History of sepsis    urosepsis  2013 due to kidney stone   Hypertension    Left nephrolithiasis    Left ureteral calculus    Neuromuscular disorder (HCC)    PAC (premature atrial contraction)    Solitary lung nodule    benign right middle lobe per CT    Past Surgical History:  Procedure Laterality Date   BACK SURGERY  2015   rod and screws   CATARACT EXTRACTION W/ INTRAOCULAR LENS  IMPLANT, BILATERAL  10/02/2008   CYSTOSCOPY WITH BIOPSY N/A 04/08/2019   Procedure: CYSTOSCOPY WITH bladder BIOPSY, FULGURATION;  Surgeon: Festus Aloe, MD;  Location: [facility];  Service: Urology;  Laterality: N/A;   CYSTOSCOPY WITH STENT PLACEMENT Left 09/15/2014   Procedure: CYSTOSCOPY WITH STENT PLACEMENT;  Surgeon: Georgette Dover, MD;  Location: [facility];  Service: Urology;  Laterality: Left;   CYSTOSCOPY/RETROGRADE/URETEROSCOPY Left 08/09/2014   Procedure: CYSTOSCOPY/RETROGRADE/LEFT STENT;  Surgeon: Bernestine Amass, MD;  Location: [facility];  Service: Urology;  Laterality: Left;   CYSTOSCOPY/URETEROSCOPY/HOLMIUM LASER/STENT PLACEMENT Left 09/15/2022   Procedure: CYSTOSCOPY LEFT URETEROSCOPY/STONE BASKET EXTRACTION;  Surgeon: Irine Seal, MD;  Location: [facility];  Service: Urology;  Laterality: Left;  1 HR FOR CASE   EXTRACORPOREAL SHOCK WAVE LITHOTRIPSY  10/03/2011   HOLMIUM LASER APPLICATION Left [id number]   Procedure: HOLMIUM LASER APPLICATION;  Surgeon: Georgette Dover, MD;  Location: [facility];  Service: Urology;  Laterality: Left;   IR URETERAL STENT LEFT NEW ACCESS W/O SEP NEPHROSTOMY CATH  08/18/2022   LAPAROTOMY W/  UNILATERAL SALPINGOOPHORECTOMY  age 14   NEPHROLITHOTOMY Left 08/18/2022   Procedure: LEFT NEPHROLITHOTOMY PERCUTANEOUS;  Surgeon: Irine Seal, MD;  Location: [facility];  Service: Urology;  Laterality: Left;  2.5 HRS FOR CASE   TOTAL ABDOMINAL HYSTERECTOMY  age 89   w/  unilateral salpingoophorectomy   TRANSTHORACIC ECHOCARDIOGRAM  02/13/2012   mild LVH/  ef 65%/  trivial MR  &  PR/  mild RVE   URETEROSCOPY Left 09/15/2014   Procedure: URETEROSCOPY;  Surgeon: Georgette Dover, MD;  Location: [facility];  Service: Urology;  Laterality: Left;    Current Outpatient Medications  Medication Sig Dispense Refill   acetaminophen (TYLENOL) 325 MG tablet Take 2 tablets (650 mg total) by mouth every 6 (six) hours as needed for mild pain (or Fever >/= 101). 30 tablet 0   amiodarone (PACERONE) 200 MG tablet Take 200 mg by mouth daily.     amLODipine (NORVASC) 2.5 MG tablet Take 1 tablet (2.5 mg total) by mouth daily. 90 tablet 3   apixaban (ELIQUIS) 5 MG TABS tablet Take 1 tablet (5 mg total) by mouth 2 (two) times daily. 60 tablet 5   fluticasone (FLONASE) 50 MCG/ACT nasal spray Place 2 sprays into the nose daily as needed for allergies.      furosemide (LASIX) 20 MG tablet Take 1 tablet (20 mg total) by mouth daily as needed. 30 tablet 3   losartan (COZAAR) 50 MG tablet Take 1 tablet (50 mg total) by mouth daily. 90 tablet 3   lubiprostone (AMITIZA) 24 MCG capsule Take 1 capsule (24 mcg total) by mouth 2 (two) times daily with a meal. 60 capsule 2   metoprolol tartrate (LOPRESSOR) 50 MG tablet Take 1.5 tablets (75 mg total) by mouth 2 (two) times daily. 270 tablet 3   ondansetron (ZOFRAN) 4 MG tablet Take 1 tablet (4 mg total) by mouth every 8 (eight) hours as needed for nausea or vomiting. 30 tablet 2   oxyCODONE-acetaminophen (PERCOCET) 10-325 MG tablet Take 1 tablet by mouth 4 (four) times daily as needed for pain.     polyethylene glycol (MIRALAX / GLYCOLAX) 17 g packet Take 17 g by mouth daily as needed for moderate constipation.     sertraline (ZOLOFT) 25 MG tablet Take 25 mg by mouth daily.     No current facility-administered medications for this visit.   Allergies:  Morphine   Social History: The patient  reports that she has never smoked.  She has never been exposed to tobacco smoke. She has never used smokeless tobacco. She reports that she does not drink alcohol and does not use drugs.   Family History: The patient's family history includes Cancer in some other family members.   ROS:  Please see the history of present illness. Otherwise, complete review of systems is positive for none.  All other systems are reviewed and negative.   Physical Exam: VS:  BP (!) 146/84   Pulse 93   Ht '5\' 5"'$  (1.651 m)   Wt 152 lb 9.6 oz (69.2 kg)   SpO2 94%   BMI 25.39 kg/m , BMI Body mass index is 25.39 kg/m.  Wt Readings from Last 3 Encounters:  12/06/22 152 lb 9.6 oz (69.2 kg)  11/30/22 155 lb (70.3 kg)  11/27/22 156 lb (70.8 kg)    General: Patient appears comfortable at  rest. HEENT: Conjunctiva and lids normal, oropharynx clear with moist mucosa. Neck: Supple, no elevated JVP or carotid bruits, no thyromegaly. Lungs: Clear to auscultation, nonlabored breathing at rest. Cardiac: Irregular rate and rhythm, no S3 or significant systolic murmur, no pericardial rub. Abdomen: Soft, nontender, no hepatomegaly, bowel sounds present, no guarding or rebound. Extremities: No pitting edema, distal pulses 2+. Skin: Warm and dry. Musculoskeletal: No kyphosis. Neuropsychiatric: Alert and oriented x3, affect grossly appropriate.  ECG today:  Atrial Fibrillation  Recent Labwork: 09/15/2022: B Natriuretic Peptide 673.0; TSH 1.165 09/16/2022: ALT 12; AST 22; BUN 9; Creatinine, Ser 0.69; Hemoglobin 13.3; Magnesium 2.3; Platelets 435; Potassium 4.0; Sodium 137  No results found for: "CHOL", "TRIG", "HDL", "CHOLHDL", "VLDL", "LDLCALC", "LDLDIRECT"  Other Studies Reviewed Today: I personally reviewed the Holter monitor and echo report from 2018 and 2023 Event monitor from 2018 showed PACs but no atrial fibrillation Echo from 2018 and 2023 showed normal LVEF and no valve abnormalities  Assessment and Plan: Patient is a 88 year old F known to  have persistent atrial fibrillation/new onset atrial flutter, HTN presented to cardiology clinic for follow-up visit.  # DOE -Patient was having shortness of breath for the last several months but noticed recent worsening of SOB, noticing at rest (had to take a deep breath similar to gasping for air) and mainly with exertion (with daily activities sometimes). This could likely be secondary to new onset atrial flutter since 11/2022 however CAD cannot be ruled out. She will benefit from DCCV procedure and stress testing in the future. However patient wants to wait, think about it and return back to cardiology clinic sooner if her SOB gets worse.  Will start Lasix 20 mg as needed for SOB symptoms (this should help her if her rates are not controlled with ambulation).  # Persistent atrial fibrillation, CV score is 4 # New onset atrial flutter, rate controlled -Continue metoprolol tartrate 75 mg twice daily, amiodarone 200 mg once daily. Amiodarone was started in 08/2022 for rate control. Patient was previously in atrial fibrillation but EKGs since 11/2022 showed new onset atrial flutter, rate controlled.  She also has been noticing shortness of breath for more than a couple of months but recently worsening. Due to new onset atrial flutter and recent worsening of SOB, she would highly benefit from DCCV. But patient would like to think about it and pursue if her symptoms get worse.  # HTN, controlled -Continue metoprolol tartrate 75 mg twice daily -Continue losartan 50 mg once daily -Continue amlodipine 2.5 mg once daily (was previously on 5 mg dose but had to lower the dose due to ankle swelling)  I have spent a total of 33 minutes with patient reviewing chart, EKGs, labs and examining patient as well as establishing an assessment and plan that was discussed with the patient.  > 50% of time was spent in direct patient care.     Medication Adjustments/Labs and Tests Ordered: Current medicines are reviewed  at length with the patient today.  Concerns regarding medicines are outlined above.   Tests Ordered: Orders Placed This Encounter  Procedures   EKG 12-Lead    Medication Changes: Meds ordered this encounter  Medications   furosemide (LASIX) 20 MG tablet    Sig: Take 1 tablet (20 mg total) by mouth daily as needed.    Dispense:  30 tablet    Refill:  3    12/06/2022 NEW    Disposition:  Follow up  6 month  Signed, Tanya Hodgkiss Priya Zaylyn Bergdoll,  MD, 12/06/2022 11:42 AM    [facility] [address], [place], [place] [address]

## 2022-12-06 NOTE — Telephone Encounter (Signed)
She did not mention "choking" with meds or abdominal pain at last visit. Reading urology note from 2/29, no concern recurrent stone, ?musculoskeletal pain.   Please speak with patient and see how she is doing. I recommend return nonurgent ov in the next 1-2 weeks to reevaluate with Abbey Chatters or APP, unless her symptoms have settled down. If she is unable to eat/drink or having worsening pain, please let me know.

## 2022-12-06 NOTE — Patient Instructions (Addendum)
Medication Instructions:  Your physician has recommended you make the following change in your medication:  Start furosemide 20 mg daily as needed Continue other medications the same  Labwork: none  Testing/Procedures: none  Follow-Up: Your physician recommends that you schedule a follow-up appointment in: 6 months  Any Other Special Instructions Will Be Listed Below (If Applicable).  If you need a refill on your cardiac medications before your next appointment, please call your pharmacy.

## 2022-12-07 MED ORDER — MOTEGRITY 2 MG PO TABS: 2.0000 mg | ORAL_TABLET | Freq: Every day | ORAL | 3 refills | Status: DC

## 2022-12-07 NOTE — Telephone Encounter (Signed)
Spoke with pt and she states that she is not having issues with choking. Is having some abdominal pain/discomfort from the bloating and gurgling in her stomach. States her bowels are still not normal and she is taking the amitiza twice daily as well as miralax once daily. States that she will sometimes go five days without a bowel movement and sometimes it will be two and then will be loose. Scheduled pt an appt with Dr. Abbey Chatters on 12/20/2022 to reassess.

## 2022-12-07 NOTE — Telephone Encounter (Signed)
Pt was made aware and verbalized understanding.  

## 2022-12-07 NOTE — Addendum Note (Signed)
Addended by: Mahala Menghini on: 12/07/2022 11:52 AM   Modules accepted: Orders

## 2022-12-07 NOTE — Telephone Encounter (Signed)
Glad you were able to get additional information. Let's keep ov with Dr. Abbey Chatters for follow up.   Let's see if we can transition patient to another medication as it seems Amitiza is not helping that much at this time. I was hoping by adding Miralax we could get her moving more consistently. She did not tolerate Movantik. Linzess was failure.   -->start Motegrity '2mg'$  daily. RX sent to [facility].  -->stop amitiza once received Motegrity -->stop miralax oncereceived Motegrity -->keep ov with dr. Abbey Chatters

## 2022-12-10 ENCOUNTER — Inpatient Hospital Stay (HOSPITAL_COMMUNITY)
Admission: EM | Admit: 2022-12-10 | Discharge: 2022-12-12 | DRG: 291 | Disposition: A | Discharge status: Home / Self Care | Payer: PPO | Attending: Internal Medicine | Admitting: Internal Medicine

## 2022-12-10 ENCOUNTER — Emergency Department (HOSPITAL_COMMUNITY): Payer: PPO

## 2022-12-10 ENCOUNTER — Other Ambulatory Visit: Payer: Self-pay

## 2022-12-10 ENCOUNTER — Encounter (HOSPITAL_COMMUNITY): Payer: Self-pay

## 2022-12-10 DIAGNOSIS — I4892 Unspecified atrial flutter: Secondary | ICD-10-CM | POA: Diagnosis present

## 2022-12-10 DIAGNOSIS — Z90721 Acquired absence of ovaries, unilateral: Secondary | ICD-10-CM | POA: Diagnosis not present

## 2022-12-10 DIAGNOSIS — J9601 Acute respiratory failure with hypoxia: Secondary | ICD-10-CM | POA: Diagnosis present

## 2022-12-10 DIAGNOSIS — Z885 Allergy status to narcotic agent status: Secondary | ICD-10-CM | POA: Diagnosis not present

## 2022-12-10 DIAGNOSIS — I10 Essential (primary) hypertension: Secondary | ICD-10-CM | POA: Diagnosis not present

## 2022-12-10 DIAGNOSIS — K5909 Other constipation: Secondary | ICD-10-CM | POA: Diagnosis present

## 2022-12-10 DIAGNOSIS — I5033 Acute on chronic diastolic (congestive) heart failure: Secondary | ICD-10-CM | POA: Diagnosis not present

## 2022-12-10 DIAGNOSIS — F32A Depression, unspecified: Secondary | ICD-10-CM | POA: Diagnosis present

## 2022-12-10 DIAGNOSIS — Z87442 Personal history of urinary calculi: Secondary | ICD-10-CM | POA: Diagnosis not present

## 2022-12-10 DIAGNOSIS — Z1152 Encounter for screening for COVID-19: Secondary | ICD-10-CM

## 2022-12-10 DIAGNOSIS — Z9079 Acquired absence of other genital organ(s): Secondary | ICD-10-CM

## 2022-12-10 DIAGNOSIS — R0602 Shortness of breath: Principal | ICD-10-CM

## 2022-12-10 DIAGNOSIS — M4690 Unspecified inflammatory spondylopathy, site unspecified: Secondary | ICD-10-CM | POA: Diagnosis present

## 2022-12-10 DIAGNOSIS — R531 Weakness: Secondary | ICD-10-CM | POA: Diagnosis not present

## 2022-12-10 DIAGNOSIS — Z7901 Long term (current) use of anticoagulants: Secondary | ICD-10-CM | POA: Diagnosis not present

## 2022-12-10 DIAGNOSIS — I4819 Other persistent atrial fibrillation: Secondary | ICD-10-CM | POA: Diagnosis not present

## 2022-12-10 DIAGNOSIS — M1990 Unspecified osteoarthritis, unspecified site: Secondary | ICD-10-CM | POA: Diagnosis present

## 2022-12-10 DIAGNOSIS — Z961 Presence of intraocular lens: Secondary | ICD-10-CM | POA: Diagnosis present

## 2022-12-10 DIAGNOSIS — I110 Hypertensive heart disease with heart failure: Principal | ICD-10-CM | POA: Diagnosis present

## 2022-12-10 DIAGNOSIS — Z90710 Acquired absence of both cervix and uterus: Secondary | ICD-10-CM

## 2022-12-10 DIAGNOSIS — J90 Pleural effusion, not elsewhere classified: Secondary | ICD-10-CM | POA: Diagnosis not present

## 2022-12-10 DIAGNOSIS — R9431 Abnormal electrocardiogram [ECG] [EKG]: Secondary | ICD-10-CM | POA: Diagnosis not present

## 2022-12-10 DIAGNOSIS — M160 Bilateral primary osteoarthritis of hip: Secondary | ICD-10-CM | POA: Diagnosis present

## 2022-12-10 DIAGNOSIS — R001 Bradycardia, unspecified: Secondary | ICD-10-CM | POA: Diagnosis present

## 2022-12-10 DIAGNOSIS — Z79899 Other long term (current) drug therapy: Secondary | ICD-10-CM

## 2022-12-10 DIAGNOSIS — J309 Allergic rhinitis, unspecified: Secondary | ICD-10-CM | POA: Diagnosis present

## 2022-12-10 DIAGNOSIS — E876 Hypokalemia: Secondary | ICD-10-CM | POA: Diagnosis present

## 2022-12-10 DIAGNOSIS — I480 Paroxysmal atrial fibrillation: Secondary | ICD-10-CM

## 2022-12-10 DIAGNOSIS — G8929 Other chronic pain: Secondary | ICD-10-CM | POA: Diagnosis present

## 2022-12-10 DIAGNOSIS — R109 Unspecified abdominal pain: Secondary | ICD-10-CM | POA: Diagnosis not present

## 2022-12-10 DIAGNOSIS — I5031 Acute diastolic (congestive) heart failure: Secondary | ICD-10-CM | POA: Diagnosis not present

## 2022-12-10 DIAGNOSIS — R079 Chest pain, unspecified: Secondary | ICD-10-CM | POA: Diagnosis not present

## 2022-12-10 LAB — COMPREHENSIVE METABOLIC PANEL
ALT: 12 U/L (ref 0–44)
AST: 29 U/L (ref 15–41)
Albumin: 3.6 g/dL (ref 3.5–5.0)
Alkaline Phosphatase: 68 U/L (ref 38–126)
Anion gap: 9 (ref 5–15)
BUN: 7 mg/dL — ABNORMAL LOW (ref 8–23)
CO2: 26 mmol/L (ref 22–32)
Calcium: 8.6 mg/dL — ABNORMAL LOW (ref 8.9–10.3)
Chloride: 99 mmol/L (ref 98–111)
Creatinine, Ser: 0.81 mg/dL (ref 0.44–1.00)
GFR, Estimated: >60 mL/min (ref >60)
Glucose, Bld: 126 mg/dL — ABNORMAL HIGH (ref 70–99)
Potassium: 2.9 mmol/L — ABNORMAL LOW (ref 3.5–5.1)
Sodium: 134 mmol/L — ABNORMAL LOW (ref 135–145)
Total Bilirubin: 0.9 mg/dL (ref 0.3–1.2)
Total Protein: 7.9 g/dL (ref 6.5–8.1)

## 2022-12-10 LAB — RESP PANEL BY RT-PCR (RSV, FLU A&B, COVID)  RVPGX2
Influenza A by PCR: NEGATIVE
Influenza B by PCR: NEGATIVE
Resp Syncytial Virus by PCR: NEGATIVE
SARS Coronavirus 2 by RT PCR: NEGATIVE

## 2022-12-10 LAB — CBC WITH DIFFERENTIAL/PLATELET
Abs Immature Granulocytes: 0.02 10*3/uL (ref 0.00–0.07)
Basophils Absolute: 0.1 10*3/uL (ref 0.0–0.1)
Basophils Relative: 1 %
Eosinophils Absolute: 0.1 10*3/uL (ref 0.0–0.5)
Eosinophils Relative: 2 %
HCT: 43.6 % (ref 36.0–46.0)
Hemoglobin: 14.4 g/dL (ref 12.0–15.0)
Immature Granulocytes: 0 %
Lymphocytes Relative: 16 %
Lymphs Abs: 1 10*3/uL (ref 0.7–4.0)
MCH: 33.6 pg (ref 26.0–34.0)
MCHC: 33 g/dL (ref 30.0–36.0)
MCV: 101.9 fL — ABNORMAL HIGH (ref 80.0–100.0)
Monocytes Absolute: 0.6 10*3/uL (ref 0.1–1.0)
Monocytes Relative: 9 %
Neutro Abs: 4.6 10*3/uL (ref 1.7–7.7)
Neutrophils Relative %: 72 %
Platelets: 288 10*3/uL (ref 150–400)
RBC: 4.28 MIL/uL (ref 3.87–5.11)
RDW: 13.4 % (ref 11.5–15.5)
WBC: 6.4 10*3/uL (ref 4.0–10.5)
nRBC: 0 % (ref 0.0–0.2)

## 2022-12-10 LAB — URINALYSIS, ROUTINE W REFLEX MICROSCOPIC
Bilirubin Urine: NEGATIVE
Glucose, UA: NEGATIVE mg/dL
Ketones, ur: NEGATIVE mg/dL
Leukocytes,Ua: NEGATIVE
Nitrite: NEGATIVE
Protein, ur: NEGATIVE mg/dL
Specific Gravity, Urine: 1.01 (ref 1.005–1.030)
pH: 7 (ref 5.0–8.0)

## 2022-12-10 LAB — TROPONIN I (HIGH SENSITIVITY)
Troponin I (High Sensitivity): 8 ng/L (ref <18)
Troponin I (High Sensitivity): 7 ng/L (ref <18)

## 2022-12-10 LAB — URINALYSIS, MICROSCOPIC (REFLEX)
Bacteria, UA: NONE SEEN
RBC / HPF: >50 RBC/hpf (ref 0–5)

## 2022-12-10 LAB — LACTIC ACID, PLASMA
Lactic Acid, Venous: 1.6 mmol/L (ref 0.5–1.9)
Lactic Acid, Venous: 1.5 mmol/L (ref 0.5–1.9)

## 2022-12-10 LAB — BRAIN NATRIURETIC PEPTIDE: B Natriuretic Peptide: 427 pg/mL — ABNORMAL HIGH (ref 0.0–100.0)

## 2022-12-10 LAB — MAGNESIUM: Magnesium: 1.8 mg/dL (ref 1.7–2.4)

## 2022-12-10 LAB — LIPASE, BLOOD: Lipase: 25 U/L (ref 11–51)

## 2022-12-10 MED ORDER — APIXABAN 5 MG PO TABS
5.0000 mg | ORAL_TABLET | Freq: Two times a day (BID) | ORAL | Status: DC
  Administered 2022-12-10 – 2022-12-12 (×4): 5 mg via ORAL
  Filled 2022-12-10 (×4): qty 1

## 2022-12-10 MED ORDER — OXYCODONE-ACETAMINOPHEN 10-325 MG PO TABS: 1.0000 | ORAL_TABLET | Freq: Four times a day (QID) | ORAL | Status: DC | PRN

## 2022-12-10 MED ORDER — POTASSIUM CHLORIDE CRYS ER 20 MEQ PO TBCR
40.0000 meq | EXTENDED_RELEASE_TABLET | Freq: Once | ORAL | Status: AC
  Administered 2022-12-10: 40 meq via ORAL
  Filled 2022-12-10: qty 2

## 2022-12-10 MED ORDER — HYDROCODONE-ACETAMINOPHEN 5-325 MG PO TABS
1.0000 | ORAL_TABLET | Freq: Once | ORAL | Status: AC
  Administered 2022-12-10: 1 via ORAL
  Filled 2022-12-10: qty 1

## 2022-12-10 MED ORDER — OXYCODONE-ACETAMINOPHEN 5-325 MG PO TABS
1.0000 | ORAL_TABLET | Freq: Four times a day (QID) | ORAL | Status: DC | PRN
  Administered 2022-12-11 – 2022-12-12 (×3): 1 via ORAL
  Filled 2022-12-10 (×3): qty 1

## 2022-12-10 MED ORDER — OXYCODONE-ACETAMINOPHEN 10-325 MG PO TABS: 1.0000 | ORAL_TABLET | Freq: Three times a day (TID) | ORAL | Status: DC | PRN

## 2022-12-10 MED ORDER — CYCLOBENZAPRINE HCL 10 MG PO TABS
10.0000 mg | ORAL_TABLET | Freq: Once | ORAL | Status: AC
  Administered 2022-12-10: 10 mg via ORAL
  Filled 2022-12-10: qty 1

## 2022-12-10 MED ORDER — AMIODARONE HCL 200 MG PO TABS
200.0000 mg | ORAL_TABLET | Freq: Every day | ORAL | Status: DC
  Administered 2022-12-11 – 2022-12-12 (×2): 200 mg via ORAL
  Filled 2022-12-10 (×2): qty 1

## 2022-12-10 MED ORDER — POLYETHYLENE GLYCOL 3350 17 G PO PACK: 17.0000 g | PACK | Freq: Every day | ORAL | Status: DC | PRN

## 2022-12-10 MED ORDER — OXYCODONE HCL 5 MG PO TABS
5.0000 mg | ORAL_TABLET | Freq: Four times a day (QID) | ORAL | Status: DC | PRN
  Administered 2022-12-10 – 2022-12-11 (×2): 5 mg via ORAL
  Filled 2022-12-10 (×2): qty 1

## 2022-12-10 MED ORDER — FUROSEMIDE 10 MG/ML IJ SOLN
40.0000 mg | Freq: Once | INTRAMUSCULAR | Status: AC
  Administered 2022-12-10: 40 mg via INTRAVENOUS
  Filled 2022-12-10: qty 4

## 2022-12-10 MED ORDER — FUROSEMIDE 10 MG/ML IJ SOLN
40.0000 mg | Freq: Two times a day (BID) | INTRAMUSCULAR | Status: DC
  Administered 2022-12-11 – 2022-12-12 (×3): 40 mg via INTRAVENOUS
  Filled 2022-12-10 (×3): qty 4

## 2022-12-10 MED ORDER — POTASSIUM CHLORIDE CRYS ER 20 MEQ PO TBCR: 40.0000 meq | EXTENDED_RELEASE_TABLET | Freq: Once | ORAL | Status: DC

## 2022-12-10 MED ORDER — LOSARTAN POTASSIUM 50 MG PO TABS
50.0000 mg | ORAL_TABLET | Freq: Every day | ORAL | Status: DC
  Administered 2022-12-11 – 2022-12-12 (×2): 50 mg via ORAL
  Filled 2022-12-10 (×2): qty 1

## 2022-12-10 MED ORDER — ACETAMINOPHEN 650 MG RE SUPP: 650.0000 mg | Freq: Four times a day (QID) | RECTAL | Status: DC | PRN

## 2022-12-10 MED ORDER — AMLODIPINE BESYLATE 5 MG PO TABS
2.5000 mg | ORAL_TABLET | Freq: Every day | ORAL | Status: DC
  Administered 2022-12-11 – 2022-12-12 (×2): 2.5 mg via ORAL
  Filled 2022-12-10 (×2): qty 1

## 2022-12-10 MED ORDER — SERTRALINE HCL 50 MG PO TABS
25.0000 mg | ORAL_TABLET | Freq: Every day | ORAL | Status: DC
  Administered 2022-12-10 – 2022-12-12 (×3): 25 mg via ORAL
  Filled 2022-12-10 (×3): qty 1

## 2022-12-10 MED ORDER — ACETAMINOPHEN 325 MG PO TABS
650.0000 mg | ORAL_TABLET | Freq: Four times a day (QID) | ORAL | Status: DC | PRN
  Administered 2022-12-11 – 2022-12-12 (×2): 650 mg via ORAL
  Filled 2022-12-10 (×3): qty 2

## 2022-12-10 NOTE — Assessment & Plan Note (Addendum)
Rate controlled on metoprolol and amiodarone, rates 47 -50, on anticoagulation with Eliquis.  She reports compliance with Eliquis. -Heart rate of 40s, appears lower than baseline, may be contributing to her dyspnea, will hold metoprolol 75 twice daily for now -Resume amiodarone - Resume Eliquis

## 2022-12-10 NOTE — ED Notes (Signed)
Pt walked around unit.  Oxygen dropped to 85%, heart rate 68.

## 2022-12-10 NOTE — ED Notes (Signed)
X-ray at bedside

## 2022-12-10 NOTE — Assessment & Plan Note (Addendum)
Systolic [id number]. -Resume Norvasc 2.5, losartan 50 -Hold metoprolol 75 twice daily

## 2022-12-10 NOTE — ED Provider Notes (Signed)
[facility] Provider Note   CSN: [id number] Arrival date & time: 12/10/22  1249     History  Chief Complaint  Patient presents with   Weakness   Shortness of Breath   HPI LILYAHNA RISINGER is a 88 y.o. female with A-fib, hypertension and back surgery 5 years ago, and uteroscopy in December of last year presenting for weakness, shortness of breath, abdominal pain, back pain, and neck pain.  States shortness of breath has been going on for couple weeks and has gotten progressively worse.  Patient states she is having hard time doing normal activities because she feels weak and short of breath throughout the day.  Denies chest pain.  Also mentions some left lower quadrant belly pain that started 3 weeks ago.  States she has been nauseous and feels constipated for the last couple weeks.  Started taking Amitiza for constipation.  Did have a bowel movement today.  No blood in the stool.  Denies vomiting.  States she also has pain all up and down her back and right shoulder.  Denies recent trauma.  Denies saddle anesthesia, urinary and bowel incontinence.  Denies fever.  Denies cough congestion.  States that overall she has had a poor appetite, hydrating okay but feels "rundown".   Weakness Associated symptoms: shortness of breath   Shortness of Breath      Home Medications Prior to Admission medications   Medication Sig Start Date End Date Taking? Authorizing Provider  acetaminophen (TYLENOL) 325 MG tablet Take 2 tablets (650 mg total) by mouth every 6 (six) hours as needed for mild pain (or Fever >/= 101). 09/16/22   Roxan Hockey, MD  amiodarone (PACERONE) 200 MG tablet Take 200 mg by mouth daily.    [provider]  amLODipine (NORVASC) 2.5 MG tablet Take 1 tablet (2.5 mg total) by mouth daily. 08/29/22 08/24/23  Mallipeddi, Vishnu P, MD  apixaban (ELIQUIS) 5 MG TABS tablet Take 1 tablet (5 mg total) by mouth 2 (two) times daily.  09/16/22   Roxan Hockey, MD  fluticasone (FLONASE) 50 MCG/ACT nasal spray Place 2 sprays into the nose daily as needed for allergies.     [provider]  furosemide (LASIX) 20 MG tablet Take 1 tablet (20 mg total) by mouth daily as needed. 12/06/22   Mallipeddi, Vishnu P, MD  losartan (COZAAR) 50 MG tablet Take 1 tablet (50 mg total) by mouth daily. 08/29/22 08/24/23  Mallipeddi, Vishnu P, MD  metoprolol tartrate (LOPRESSOR) 50 MG tablet Take 1.5 tablets (75 mg total) by mouth 2 (two) times daily. 09/16/22 09/11/23  Roxan Hockey, MD  ondansetron (ZOFRAN) 4 MG tablet Take 1 tablet (4 mg total) by mouth every 8 (eight) hours as needed for nausea or vomiting. 04/19/22   Eloise Harman, DO  oxyCODONE-acetaminophen (PERCOCET) 10-325 MG tablet Take 1 tablet by mouth 4 (four) times daily as needed for pain. 08/28/22   [provider]  polyethylene glycol (MIRALAX / GLYCOLAX) 17 g packet Take 17 g by mouth daily as needed for moderate constipation.    [provider]  Prucalopride Succinate (MOTEGRITY) 2 MG TABS Take 1 tablet (2 mg total) by mouth daily. 12/07/22   Mahala Menghini, PA-C  sertraline (ZOLOFT) 25 MG tablet Take 25 mg by mouth daily. 10/30/22   [provider]      Allergies    Morphine    Review of Systems   Review of Systems  Respiratory:  Positive for shortness of breath.   Neurological:  Positive for weakness.    Physical Exam   Vitals:   12/10/22 1420 12/10/22 1500  BP:  (!) 161/75  Pulse: (!) 49 (!) 50  Resp: 14 18  Temp:    SpO2: (!) 85% 90%    CONSTITUTIONAL:  well-appearing, NAD NEURO:  Alert and oriented x 3, CN 3-12 grossly intact EYES:  eyes equal and reactive ENT/NECK:  Supple, no stridor  CARDIO:  bradycardia regular rhythm, appears well-perfused  PULM:  No respiratory distress, breath sounds diminished R>L GI/GU:  non-distended, soft, non tender MSK/SPINE:  No gross deformities, no edema, moves all extremities  SKIN:   no rash, atraumatic  *Additional and/or pertinent findings included in MDM below   ED Results / Procedures / Treatments   Labs (all labs ordered are listed, but only abnormal results are displayed) Labs Reviewed  URINALYSIS, ROUTINE W REFLEX MICROSCOPIC - Abnormal; Notable for the following components:      Result Value   Hgb urine dipstick LARGE (*)    All other components within normal limits  BRAIN NATRIURETIC PEPTIDE - Abnormal; Notable for the following components:   B Natriuretic Peptide 427.0 (*)    All other components within normal limits  COMPREHENSIVE METABOLIC PANEL - Abnormal; Notable for the following components:   Sodium 134 (*)    Potassium 2.9 (*)    Glucose, Bld 126 (*)    BUN 7 (*)    Calcium 8.6 (*)    All other components within normal limits  CBC WITH DIFFERENTIAL/PLATELET - Abnormal; Notable for the following components:   MCV 101.9 (*)    All other components within normal limits  RESP PANEL BY RT-PCR (RSV, FLU A&B, COVID)  RVPGX2  LIPASE, BLOOD  LACTIC ACID, PLASMA  LACTIC ACID, PLASMA  MAGNESIUM  URINALYSIS, MICROSCOPIC (REFLEX)  TROPONIN I (HIGH SENSITIVITY)  TROPONIN I (HIGH SENSITIVITY)    EKG EKG Interpretation  Date/Time:  Sunday December 10 2022 13:13:54 EDT Ventricular Rate:  51 PR Interval:  223 QRS Duration: 102 QT Interval:  587 QTC Calculation: 541 R Axis:   73 Text Interpretation: Sinus rhythm Prolonged PR interval Probable anterior infarct, age indeterminate flat T waves - possible Since last tracing rate slower afib resolved Confirmed by Noemi Chapel [phone]) on 12/10/2022 1:19:34 PM  Radiology DG Abdomen 1 View  Result Date: 12/10/2022 CLINICAL DATA:  Short of breath, multifocal pain EXAM: ABDOMEN - 1 VIEW COMPARISON:  05/05/2022 FINDINGS: Supine frontal view of the abdomen and pelvis excludes the hemidiaphragms and left flank by collimation. Bowel gas pattern is unremarkable without obstruction or ileus. No masses or abnormal  calcifications. Stable postsurgical changes of the lumbar spine. Visualized portions of the bony pelvis are normal. IMPRESSION: 1. Unremarkable bowel gas pattern. Electronically Signed   By: Randa Ngo M.D.   On: 12/10/2022 14:10   DG Chest Portable 1 View  Result Date: 12/10/2022 CLINICAL DATA:  Short of breath, multifocal pain EXAM: PORTABLE CHEST 1 VIEW COMPARISON:  09/15/2022 FINDINGS: Single frontal view of the chest demonstrates an enlarged cardiac silhouette. There is increased central vascular congestion, with stable small left pleural effusion and progressive moderate right pleural effusion identified. Bibasilar areas of consolidation may reflect airspace disease or atelectasis. No pneumothorax. No acute bony abnormality. IMPRESSION: 1. Worsening volume status, with increasing central vascular congestion and enlarged right pleural effusion as above. Stable small left pleural effusion. Electronically Signed   By: Diana Eves.D.  On: 12/10/2022 14:09    Procedures Procedures    Medications Ordered in ED Medications  potassium chloride SA (KLOR-CON M) CR tablet 40 mEq (40 mEq Oral Given 12/10/22 1534)  furosemide (LASIX) injection 40 mg (40 mg Intravenous Given 12/10/22 1537)  HYDROcodone-acetaminophen (NORCO/VICODIN) 5-325 MG per tablet 1 tablet (1 tablet Oral Given 12/10/22 1545)    ED Course/ Medical Decision Making/ A&P                             Medical Decision Making Amount and/or Complexity of Data Reviewed Labs: ordered. Radiology: ordered.  Risk Prescription drug management. Decision regarding hospitalization.   Initial Impression and Ddx 88 year old female who is well-appearing and hemodynamically stable presenting for weakness and shortness of breath.  Exam notable for diminished breath sounds in the bases of the lungs right greater than left.  DDx includes CHF exacerbation, ACS, pneumonia, pleural effusion, pneumothorax, metabolic derangement, dehydration,  and sepsis. Patient PMH that increases complexity of ED encounter:  afib, hypertension  Interpretation of Diagnostics I independent reviewed and interpreted the labs as followed: Elevated BNP, hypokalemia, hematuria  - I independently visualized the following imaging with scope of interpretation limited to determining acute life threatening conditions related to emergency care: Chest x-ray revealed right pleural effusion and vascular congestion, no acute findings on KUB  -I personally reviewed and interpreted EKG which revealed sinus bradycardia  Patient Reassessment and Ultimate Disposition/Management Patient with month-long history of shortness of breath and with new findings of pleural effusion and worsening vascular congestion, patient also dyspneic and hypoxic when ambulating around the unit.  Of note, effusion not present and x-ray of the chest on March 1. Symptoms and clinical findings all concerning for likely CHF exacerbation.  Treated with IV Lasix.  Treated hyperkalemia with oral potassium.  Patient endorsed neck and back pain.  Treated with Norco.  Admitted to hospital service with Dr. Arlyce Dice for CHF exacerbation.  Patient management required discussion with the following services or consulting groups:  Hospitalist Service  Complexity of Problems Addressed Acute complicated illness or Injury  Additional Data Reviewed and Analyzed Further history obtained from: Further history from spouse/family member, Past medical history and medications listed in the EMR, and Prior ED visit notes  Patient Encounter Risk Assessment Consideration of hospitalization         Final Clinical Impression(s) / ED Diagnoses Final diagnoses:  Shortness of breath  Weakness    Rx / DC Orders ED Discharge Orders     None         Harriet Pho, PA-C 12/10/22 1638    Noemi Chapel, MD 12/10/22 1750

## 2022-12-10 NOTE — Assessment & Plan Note (Addendum)
O2 sats down to 85% on room air with ambulation.  Likely due to congestive heart failure.  Doubt PE, she has been compliant with Eliquis.

## 2022-12-10 NOTE — H&P (Signed)
History and Physical    Tanya Mueller [id number] DOB: 1935/02/16 DOA: 12/10/2022  PCP: Manon Hilding, MD   Patient coming from: Home  I have personally briefly reviewed patient's old medical records in [place]  Chief Complaint: Difficulty breathing  HPI: Tanya Mueller is a 88 y.o. female with medical history significant for paroxysmal atrial fibrillation, hypertension, nephrolithiasis. Patient presented to the ED with complaints of neck/shoulder pain, back pain, left lower quadrant abdominal pain, generalized weakness, difficulty breathing with ambulation, poor appetite. Over the past month, she has had progressive difficulty breathing with ambulation .  She saw her cardiologist 3/6 and was prescribed Lasix 20 mg as needed.  She reports some mild mid lower chest pain also.  She sleeps on her right side to chronic back issues, but has not needed to prop herself up to sleep.  She reports bilateral lower extremity swelling that has improved today.  No abdominal bloating.  She believes she has lost weight over the past year previously 170s now 155 pounds.  ED Course: O2 sats dropped to 85% with ambulation on room air.  Heart rate 49 - 50.  Potassium 2.9.  BNP 427.  Chest x-ray shows worsening volume status, increased central vascular congestion, enlarged right pleural effusion.  IV Lasix 40 mg x 1 given. Hospitalist to admit for acute hypoxic respiratory failure.  Review of Systems: As per HPI all other systems reviewed and negative.  Past Medical History:  Diagnosis Date   Allergic rhinitis    Arthritis    Back ,hips   Cancer ([place])    Complication of anesthesia    awareness during general anesthesia.   Dysrhythmia    A-Fib and PSVT   History of atrial fibrillation    episode 2013 converted with cardizem-- happened at time acute illness due to kidney stone --  resolved  no issues since   History of kidney stones    History of sepsis    urosepsis  2013 due to kidney stone    Hypertension    Left nephrolithiasis    Left ureteral calculus    Neuromuscular disorder (HCC)    PAC (premature atrial contraction)    Solitary lung nodule    benign right middle lobe per CT    Past Surgical History:  Procedure Laterality Date   BACK SURGERY  2015   rod and screws   CATARACT EXTRACTION W/ INTRAOCULAR LENS  IMPLANT, BILATERAL  10/02/2008   CYSTOSCOPY WITH BIOPSY N/A 04/08/2019   Procedure: CYSTOSCOPY WITH bladder BIOPSY, FULGURATION;  Surgeon: Festus Aloe, MD;  Location: [facility];  Service: Urology;  Laterality: N/A;   CYSTOSCOPY WITH STENT PLACEMENT Left 09/15/2014   Procedure: CYSTOSCOPY WITH STENT PLACEMENT;  Surgeon: Georgette Dover, MD;  Location: [facility];  Service: Urology;  Laterality: Left;   CYSTOSCOPY/RETROGRADE/URETEROSCOPY Left 08/09/2014   Procedure: CYSTOSCOPY/RETROGRADE/LEFT STENT;  Surgeon: Bernestine Amass, MD;  Location: [facility];  Service: Urology;  Laterality: Left;   CYSTOSCOPY/URETEROSCOPY/HOLMIUM LASER/STENT PLACEMENT Left 09/15/2022   Procedure: CYSTOSCOPY LEFT URETEROSCOPY/STONE BASKET EXTRACTION;  Surgeon: Irine Seal, MD;  Location: [facility];  Service: Urology;  Laterality: Left;  1 HR FOR CASE   EXTRACORPOREAL SHOCK WAVE LITHOTRIPSY  10/03/2011   HOLMIUM LASER APPLICATION Left [id number]   Procedure: HOLMIUM LASER APPLICATION;  Surgeon: Georgette Dover, MD;  Location: [facility];  Service: Urology;  Laterality: Left;   IR URETERAL STENT LEFT NEW ACCESS W/O SEP NEPHROSTOMY CATH  08/18/2022  LAPAROTOMY W/  UNILATERAL SALPINGOOPHORECTOMY  age 72   NEPHROLITHOTOMY Left 08/18/2022   Procedure: LEFT NEPHROLITHOTOMY PERCUTANEOUS;  Surgeon: Irine Seal, MD;  Location: [facility];  Service: Urology;  Laterality: Left;  2.5 HRS FOR CASE   TOTAL ABDOMINAL HYSTERECTOMY  age 89   w/ unilateral salpingoophorectomy   TRANSTHORACIC ECHOCARDIOGRAM  02/13/2012   mild LVH/  ef 65%/  trivial MR  &  PR/  mild RVE    URETEROSCOPY Left 09/15/2014   Procedure: URETEROSCOPY;  Surgeon: Georgette Dover, MD;  Location: [facility];  Service: Urology;  Laterality: Left;     reports that she has never smoked. She has never been exposed to tobacco smoke. She has never used smokeless tobacco. She reports that she does not drink alcohol and does not use drugs.  Allergies  Allergen Reactions   Morphine     Nervous    Family History  Problem Relation Age of Onset   Cancer Other        Lung and bladder   Cancer Other        "Bone marrow cancer"    Prior to Admission medications   Medication Sig Start Date End Date Taking? Authorizing Provider  acetaminophen (TYLENOL) 325 MG tablet Take 2 tablets (650 mg total) by mouth every 6 (six) hours as needed for mild pain (or Fever >/= 101). 09/16/22   Roxan Hockey, MD  amiodarone (PACERONE) 200 MG tablet Take 200 mg by mouth daily.    [provider]  amLODipine (NORVASC) 2.5 MG tablet Take 1 tablet (2.5 mg total) by mouth daily. 08/29/22 08/24/23  Mallipeddi, Vishnu P, MD  apixaban (ELIQUIS) 5 MG TABS tablet Take 1 tablet (5 mg total) by mouth 2 (two) times daily. 09/16/22   Roxan Hockey, MD  fluticasone (FLONASE) 50 MCG/ACT nasal spray Place 2 sprays into the nose daily as needed for allergies.     [provider]  furosemide (LASIX) 20 MG tablet Take 1 tablet (20 mg total) by mouth daily as needed. 12/06/22   Mallipeddi, Vishnu P, MD  losartan (COZAAR) 50 MG tablet Take 1 tablet (50 mg total) by mouth daily. 08/29/22 08/24/23  Mallipeddi, Vishnu P, MD  metoprolol tartrate (LOPRESSOR) 50 MG tablet Take 1.5 tablets (75 mg total) by mouth 2 (two) times daily. 09/16/22 09/11/23  Roxan Hockey, MD  ondansetron (ZOFRAN) 4 MG tablet Take 1 tablet (4 mg total) by mouth every 8 (eight) hours as needed for nausea or vomiting. 04/19/22   Eloise Harman, DO  oxyCODONE-acetaminophen (PERCOCET) 10-325 MG tablet Take 1 tablet by mouth 4  (four) times daily as needed for pain. 08/28/22   [provider]  polyethylene glycol (MIRALAX / GLYCOLAX) 17 g packet Take 17 g by mouth daily as needed for moderate constipation.    [provider]  Prucalopride Succinate (MOTEGRITY) 2 MG TABS Take 1 tablet (2 mg total) by mouth daily. 12/07/22   Mahala Menghini, PA-C  sertraline (ZOLOFT) 25 MG tablet Take 25 mg by mouth daily. 10/30/22   [provider]    Physical Exam: Vitals:   12/10/22 1405 12/10/22 1410 12/10/22 1420 12/10/22 1500  BP:    (!) 161/75  Pulse: (!) 48 (!) 47 (!) 49 (!) 50  Resp: '13 14 14 18  '$ Temp:      TempSrc:      SpO2: 90% 90% (!) 85% 90%  Weight:      Height:  Constitutional: NAD, calm, comfortable Vitals:   12/10/22 1405 12/10/22 1410 12/10/22 1420 12/10/22 1500  BP:    (!) 161/75  Pulse: (!) 48 (!) 47 (!) 49 (!) 50  Resp: '13 14 14 18  '$ Temp:      TempSrc:      SpO2: 90% 90% (!) 85% 90%  Weight:      Height:       Eyes: PERRL, lids and conjunctivae normal ENMT: Mucous membranes are moist.  Neck: normal, supple, no masses, no thyromegaly Respiratory: Reduced breath sounds right lower lung, no wheezing, Normal respiratory effort. No accessory muscle use.  Cardiovascular: Regular rate and rhythm, no murmurs / rubs / gallops. No extremity edema.  Extremities warm. Abdomen: no tenderness, no masses palpated. No hepatosplenomegaly. Bowel sounds positive.  Musculoskeletal: no clubbing / cyanosis. No joint deformity upper and lower extremities.  Skin: no rashes, lesions, ulcers. No induration Neurologic: No apparent cranial nerve abnormality, moving all extremities spontaneously Psychiatric: Normal judgment and insight. Alert and oriented x 3. Normal mood.   Labs on Admission: I have personally reviewed following labs and imaging studies  CBC: Recent Labs  Lab 12/10/22 1342  WBC 6.4  NEUTROABS 4.6  HGB 14.4  HCT 43.6  MCV 101.9*  PLT [id number]   Basic Metabolic  Panel: Recent Labs  Lab 12/10/22 1342  NA 134*  K 2.9*  CL 99  CO2 26  GLUCOSE 126*  BUN 7*  CREATININE 0.81  CALCIUM 8.6*  MG 1.8   GFR: Estimated Creatinine Clearance: 49.9 mL/min (by C-G formula based on SCr of 0.81 mg/dL). Liver Function Tests: Recent Labs  Lab 12/10/22 1342  AST 29  ALT 12  ALKPHOS 68  BILITOT 0.9  PROT 7.9  ALBUMIN 3.6   Recent Labs  Lab 12/10/22 1342  LIPASE 25   Urine analysis:    Component Value Date/Time   COLORURINE YELLOW 12/10/2022 1440   APPEARANCEUR CLEAR 12/10/2022 1440   APPEARANCEUR Hazy (A) 11/30/2022 1336   LABSPEC 1.010 12/10/2022 1440   PHURINE 7.0 12/10/2022 1440   GLUCOSEU NEGATIVE 12/10/2022 1440   HGBUR LARGE (A) 12/10/2022 1440   BILIRUBINUR NEGATIVE 12/10/2022 1440   BILIRUBINUR Negative 11/30/2022 1336   KETONESUR NEGATIVE 12/10/2022 1440   PROTEINUR NEGATIVE 12/10/2022 1440   UROBILINOGEN 0.2 08/09/2014 1230   NITRITE NEGATIVE 12/10/2022 1440   LEUKOCYTESUR NEGATIVE 12/10/2022 1440    Radiological Exams on Admission: DG Abdomen 1 View  Result Date: 12/10/2022 CLINICAL DATA:  Short of breath, multifocal pain EXAM: ABDOMEN - 1 VIEW COMPARISON:  05/05/2022 FINDINGS: Supine frontal view of the abdomen and pelvis excludes the hemidiaphragms and left flank by collimation. Bowel gas pattern is unremarkable without obstruction or ileus. No masses or abnormal calcifications. Stable postsurgical changes of the lumbar spine. Visualized portions of the bony pelvis are normal. IMPRESSION: 1. Unremarkable bowel gas pattern. Electronically Signed   By: Randa Ngo M.D.   On: 12/10/2022 14:10   DG Chest Portable 1 View  Result Date: 12/10/2022 CLINICAL DATA:  Short of breath, multifocal pain EXAM: PORTABLE CHEST 1 VIEW COMPARISON:  09/15/2022 FINDINGS: Single frontal view of the chest demonstrates an enlarged cardiac silhouette. There is increased central vascular congestion, with stable small left pleural effusion and  progressive moderate right pleural effusion identified. Bibasilar areas of consolidation may reflect airspace disease or atelectasis. No pneumothorax. No acute bony abnormality. IMPRESSION: 1. Worsening volume status, with increasing central vascular congestion and enlarged right pleural effusion as above. Stable small left  pleural effusion. Electronically Signed   By: Randa Ngo M.D.   On: 12/10/2022 14:09    EKG: Independently reviewed.  Sinus rhythm, rate 51, QTc prolonged 551.  Nonspecific T wave abnormalities-leads III, aVF, V2 V3., not significantly changed from prior.  Assessment/Plan Principal Problem:   Acute respiratory failure with hypoxia (HCC) Active Problems:   Acute on chronic diastolic CHF (congestive heart failure) (HCC)   Hypokalemia   Essential hypertension   Paroxysmal atrial fibrillation with RVR (HCC)   Chronic pain  Assessment and Plan: * Acute respiratory failure with hypoxia (HCC) O2 sats down to 85% on room air with ambulation.  Likely due to congestive heart failure.  Doubt PE, she has been compliant with Eliquis.  Acute on chronic diastolic CHF (congestive heart failure) ([place]) CHF with acute hypoxic respiratory failure.  BNP elevated at 427, prior elevations to 600s during hospitalization for A-fib with RVR and CHF 09/2022. Chest x-ray showing-worsening volume status with increased central vascular congestion, and enlarged right pleural effusion.  No significant peripheral edema.  She reports weight loss over the past year.  Last echo 09/2022, EF of 55 to 60%. -Troponin 8 > 7. -IV Lasix 40 twice daily -Strict input output, daily weights, daily BMP  Prolonged QT interval QTc prolonged at 541.  She is on amiodarone and sertraline, hypokalemia K- 2.9. Mag=- normal- 1.8. -Resume home medications  Chronic pain Chronic back pain, has had back surgery.  Complains of LLQ abd pain.  Abd exam is benign.  WBC 6.4.  Reports chronic constipation.  X-ray  abd-unremarkable bowel gas pattern.  History of nephrolithiasis, will obtain at this time not suggestive of renal stone.  Also complains of right neck/shoulder pain.-Likely musculoskeletal - Muscle relaxants for now -Resume home Percocets every 8 hourly as needed  Paroxysmal atrial fibrillation with RVR (HCC) Rate controlled on metoprolol and amiodarone, rates 47 -50, on anticoagulation with Eliquis.  She reports compliance with Eliquis. -Heart rate of 40s, appears lower than baseline, may be contributing to her dyspnea, will hold metoprolol 75 twice daily for now -Resume amiodarone - Resume Eliquis  Essential hypertension Systolic [id number]. -Resume Norvasc 2.5, losartan 50 -Hold metoprolol 75 twice daily  Hypokalemia Potassium 2.9.  Potassium 1.8. -Replete potassium   DVT prophylaxis: Eliquis Code Status: DNI-wants CPR, fibrillation.  Does not want to endotracheal intubation/mechanical ventilation.  Confirmed with patient and daughter Rinda at bedside. Family Communication: Daughter Rinda at bedside- is HCPOA.  Disposition Plan: ~ 2 days Consults called: None  Admission status:  Inpt Tele I certify that at the point of admission it is my clinical judgment that the patient will require inpatient hospital care spanning beyond 2 midnights from the point of admission due to high intensity of service, high risk for further deterioration and high frequency of surveillance required.    Author: Bethena Roys, MD 12/10/2022 6:36 PM  For on call review [email or web address].

## 2022-12-10 NOTE — Assessment & Plan Note (Signed)
Potassium 2.9.  Potassium 1.8. -Replete potassium

## 2022-12-10 NOTE — Assessment & Plan Note (Addendum)
CHF with acute hypoxic respiratory failure.  BNP elevated at 427, prior elevations to 600s during hospitalization for A-fib with RVR and CHF 09/2022. Chest x-ray showing-worsening volume status with increased central vascular congestion, and enlarged right pleural effusion.  No significant peripheral edema.  She reports weight loss over the past year.  Last echo 09/2022, EF of 55 to 60%. -Troponin 8 > 7. -IV Lasix 40 twice daily -Strict input output, daily weights, daily BMP

## 2022-12-10 NOTE — Assessment & Plan Note (Addendum)
Chronic back pain, has had back surgery.  Complains of LLQ abd pain.  Abd exam is benign.  WBC 6.4.  Reports chronic constipation.  X-ray abd-unremarkable bowel gas pattern.  History of nephrolithiasis, will obtain at this time not suggestive of renal stone.  Also complains of right neck/shoulder pain.-Likely musculoskeletal - Muscle relaxants for now -Resume home Percocets every 8 hourly as needed

## 2022-12-10 NOTE — Assessment & Plan Note (Addendum)
QTc prolonged at 541.  She is on amiodarone and sertraline, hypokalemia K- 2.9. Mag=- normal- 1.8. -Resume home medications

## 2022-12-10 NOTE — ED Triage Notes (Signed)
Pt c/o ongoing pain to multiple sites, today has gotten worse and pt wants to be checked out.   Pain to the R side of neck and lower back, LLQ abd pain at times, leg pain. Denies recent injury, has hx of back surgery with daily pain medications with little relief. Also tried tylenol, ibuprofen, different positions.   Pt A & O x 4. Ambulatory with assist in triage.

## 2022-12-10 NOTE — Progress Notes (Signed)
12/10/2022 2030 Received pt to room 340 from ED with dx of Respiratory Failure.  Pt is A&O, no C/O voiced at this time.  R shoulder is a little uncomfortable-hot pack given to pt.  Tele monitor applied and CCMD notified.  Oriented to room, call light and bed.  Call bell in reach, family at bedside.   Carney [place]

## 2022-12-11 DIAGNOSIS — R9431 Abnormal electrocardiogram [ECG] [EKG]: Secondary | ICD-10-CM | POA: Diagnosis not present

## 2022-12-11 DIAGNOSIS — I110 Hypertensive heart disease with heart failure: Secondary | ICD-10-CM | POA: Diagnosis not present

## 2022-12-11 DIAGNOSIS — I5031 Acute diastolic (congestive) heart failure: Secondary | ICD-10-CM

## 2022-12-11 DIAGNOSIS — G8929 Other chronic pain: Secondary | ICD-10-CM | POA: Diagnosis not present

## 2022-12-11 DIAGNOSIS — I5033 Acute on chronic diastolic (congestive) heart failure: Secondary | ICD-10-CM | POA: Diagnosis not present

## 2022-12-11 DIAGNOSIS — J9601 Acute respiratory failure with hypoxia: Secondary | ICD-10-CM | POA: Diagnosis not present

## 2022-12-11 DIAGNOSIS — Z1152 Encounter for screening for COVID-19: Secondary | ICD-10-CM | POA: Diagnosis not present

## 2022-12-11 DIAGNOSIS — I4892 Unspecified atrial flutter: Secondary | ICD-10-CM

## 2022-12-11 LAB — BASIC METABOLIC PANEL
Anion gap: 7 (ref 5–15)
BUN: 7 mg/dL — ABNORMAL LOW (ref 8–23)
CO2: 29 mmol/L (ref 22–32)
Calcium: 8.3 mg/dL — ABNORMAL LOW (ref 8.9–10.3)
Chloride: 101 mmol/L (ref 98–111)
Creatinine, Ser: 0.93 mg/dL (ref 0.44–1.00)
GFR, Estimated: >60 mL/min (ref >60)
Glucose, Bld: 110 mg/dL — ABNORMAL HIGH (ref 70–99)
Potassium: 3.5 mmol/L (ref 3.5–5.1)
Sodium: 137 mmol/L (ref 135–145)

## 2022-12-11 LAB — MAGNESIUM: Magnesium: 1.8 mg/dL (ref 1.7–2.4)

## 2022-12-11 MED ORDER — CYCLOBENZAPRINE HCL 10 MG PO TABS
5.0000 mg | ORAL_TABLET | Freq: Three times a day (TID) | ORAL | Status: DC | PRN
  Administered 2022-12-12: 5 mg via ORAL
  Filled 2022-12-11 (×2): qty 1

## 2022-12-11 MED ORDER — POTASSIUM CHLORIDE CRYS ER 20 MEQ PO TBCR
40.0000 meq | EXTENDED_RELEASE_TABLET | Freq: Once | ORAL | Status: AC
  Administered 2022-12-11: 40 meq via ORAL
  Filled 2022-12-11: qty 2

## 2022-12-11 MED ORDER — LIVING BETTER WITH HEART FAILURE BOOK: Freq: Once | Status: DC

## 2022-12-11 NOTE — Progress Notes (Addendum)
Mobility Specialist Progress Note:    12/11/22 1000  Mobility  Activity Ambulated with assistance in hallway  Level of Assistance Standby assist, set-up cues, supervision of patient - no hands on  Assistive Device Front wheel walker  Distance Ambulated (ft) 200 ft  Activity Response Tolerated well  Mobility Referral Yes  $Mobility charge 1 Mobility   Pt agreeable to mobility session. Tolerated well, asx throughout. RW used for safety. SpO2 88% on RA during ambulation. Returned pt to room, recovered, SpO2 94% on RA. Left pt at EOB with daughter in room, all needs met.   Royetta Crochet Mobility Specialist Please contact via [employer] or  Rehab office at [phone]

## 2022-12-11 NOTE — Progress Notes (Signed)
Ambulated in hallway with front wheel walker times one assist, patient did  c/o moderate low back pain. Patient was given oxycodone one tablet per [facility]. Call bell in reach. Plan of care on going.

## 2022-12-11 NOTE — Progress Notes (Addendum)
PROGRESS NOTE    Tanya Mueller  [id number] DOB: [birth year] DOA: 12/10/2022 PCP: Manon Hilding, MD  Brief Narrative:  Tanya Mueller is a 88 y.o. female with medical history significant for paroxysmal atrial fibrillation, hypertension, nephrolithiasis presenting to the ED on 3/10 with complaints of neck/shoulder pain, back pain, left lower quadrant abdominal pain, and generalized weakness.  Was in atrial fibrillation and bradycardic, prolonged QTc.  Found to be hypokalemic and repleted.  BNP was elevated and CXR showed vascular congestion, right pleural effusion.  Treated with IV Lasix.  Problem List:   Principal Problem:   Acute respiratory failure with hypoxia (HCC) Active Problems:   Hypokalemia   Essential hypertension   Paroxysmal atrial fibrillation with RVR (HCC)   Acute on chronic diastolic CHF (congestive heart failure) (HCC)   Chronic pain   Prolonged QT interval  Assessment and Plan:  Acute respiratory failure with hypoxia Continue to suspect CHF as most likely etiology. - O2 to comfort, wean as able  Acute on chronic diastolic CHF Last echo [id number], EF of 55 to 60%.  Down net -450 mL since admission.  Of note, patient reports that she has been losing weight past few months. - Continuing IV Lasix 40 twice daily - Strict I/Os, daily weights, daily BMP  Prolonged QT interval, resolved QTc improved to 362 this morning - Resume home medications, optimize electrolytes  Chronic pain Chronic back pain, has had back surgery.  Also complains of right neck/shoulder pain, which is likely musculoskeletal.  Received 1 dose Flexeril yesterday.  Somewhat improved this morning. - Resume home Percocets every 8 hourly as needed - Flexeril 5 mg TID PRN  Paroxysmal atrial fibrillation Rate controlled on metoprolol and amiodarone, rates 47 -50, on anticoagulation with Eliquis, compliant at home. - Holding metoprolol 75 mg twice daily in setting of bradycardia - Resume  amiodarone - Resume Eliquis - Cardiology consulted, appreciate recs  Essential hypertension - Resume Norvasc 2.5, losartan 50 - Hold metoprolol 75 twice daily in setting of bradycardia  Hypokalemia K 3.5 this AM. - Goal K>4, Mg>2, optimize  Depression - Continue home sertraline  DVT prophylaxis: Eliquis Code Status: DO NOT INTUBATE, does want CPR Family Communication: Daughter at bedside 3/11 Disposition Plan: Patient is from: Home Anticipated d/c is to: Home Anticipated d/c date is: 2 days Patient currently: Pending diuresis Admission Status is: Inpatient Remains inpatient appropriate because: Volume overload, bradycardic  Consultants:  Cardiology  Procedures:  None  Antimicrobials:  None  Subjective: Patient seen and evaluated today with no new acute complaints or concerns. No acute concerns or events noted overnight.  Reporting that she feels well, though does continue to have the back and neck pain.  States that her breathing is better than it was yesterday.  Objective: Vitals:   12/10/22 2037 12/11/22 0011 12/11/22 0451 12/11/22 0458  BP: (!) 174/65 (!) 142/63 (!) 116/49   Pulse: (!) 55 (!) 59 (!) 54   Resp: '20 18 16   '$ Temp: 97.8 F (36.6 C) 98.1 F (36.7 C) 98.5 F (36.9 C)   TempSrc: Oral Oral Oral   SpO2: 94% 92% 93%   Weight: 65.8 kg   65.3 kg  Height:        Intake/Output Summary (Last 24 hours) at 12/11/2022 [id number] Last data filed at 12/11/2022 0000 Gross per 24 hour  Intake --  Output 450 ml  Net -450 ml   Filed Weights   12/10/22 1257 12/10/22 2037 12/11/22 0458  Weight: 70.3 kg  65.8 kg 65.3 kg    Examination: General: Adult female sitting on edge of bed.  No acute distress.  Pleasant and appropriate. HEENT: MMM.  No scleral icterus. Lungs: Diminished breath sounds at right lung base.  Otherwise CTAB.  Dullness to percussion over right lung base. Cardiovascular: No JVD.  Bradycardic, irregular rhythm.  Normal S1/S2.  No murmurs/rubs or  gallops. Abdomen: Soft, nontender.  No CVA tenderness. Extremities: Trace bilateral pitting edema.  No sinus or clubbing. Skin: Warm, dry. Neuro: Alert and oriented x 4.  No focal neurologic deficit.  Data Reviewed: I have personally reviewed following labs and imaging studies.  CBC: Recent Labs  Lab 12/10/22 1342  WBC 6.4  NEUTROABS 4.6  HGB 14.4  HCT 43.6  MCV 101.9*  PLT [id number]   Basic Metabolic Panel: Recent Labs  Lab 12/10/22 1342 12/11/22 0456  NA 134* 137  K 2.9* 3.5  CL 99 101  CO2 26 29  GLUCOSE 126* 110*  BUN 7* 7*  CREATININE 0.81 0.93  CALCIUM 8.6* 8.3*  MG 1.8  --    GFR: Estimated Creatinine Clearance: 39.8 mL/min (by C-G formula based on SCr of 0.93 mg/dL). Liver Function Tests: Recent Labs  Lab 12/10/22 1342  AST 29  ALT 12  ALKPHOS 68  BILITOT 0.9  PROT 7.9  ALBUMIN 3.6   Recent Labs  Lab 12/10/22 1342  LIPASE 25   No results for input(s): "AMMONIA" in the last 168 hours. Coagulation Profile: No results for input(s): "INR", "PROTIME" in the last 168 hours. Cardiac Enzymes: No results for input(s): "CKTOTAL", "CKMB", "CKMBINDEX", "TROPONINI" in the last 168 hours. BNP (last 3 results) No results for input(s): "PROBNP" in the last 8760 hours. HbA1C: No results for input(s): "HGBA1C" in the last 72 hours. CBG: No results for input(s): "GLUCAP" in the last 168 hours. Lipid Profile: No results for input(s): "CHOL", "HDL", "LDLCALC", "TRIG", "CHOLHDL", "LDLDIRECT" in the last 72 hours. Thyroid Function Tests: No results for input(s): "TSH", "T4TOTAL", "FREET4", "T3FREE", "THYROIDAB" in the last 72 hours. Anemia Panel: No results for input(s): "VITAMINB12", "FOLATE", "FERRITIN", "TIBC", "IRON", "RETICCTPCT" in the last 72 hours. Sepsis Labs: Recent Labs  Lab 12/10/22 1403 12/10/22 1544  LATICACIDVEN 1.6 1.5    Recent Results (from the past 240 hour(s))  Resp panel by RT-PCR (RSV, Flu A&B, Covid) Anterior Nasal Swab     Status: None    Collection Time: 12/10/22  2:34 PM   Specimen: Anterior Nasal Swab  Result Value Ref Range Status   SARS Coronavirus 2 by RT PCR NEGATIVE NEGATIVE Final    Comment: (NOTE) SARS-CoV-2 target nucleic acids are NOT DETECTED.  The SARS-CoV-2 RNA is generally detectable in upper respiratory specimens during the acute phase of infection. The lowest concentration of SARS-CoV-2 viral copies this assay can detect is 138 copies/mL. A negative result does not preclude SARS-Cov-2 infection and should not be used as the sole basis for treatment or other patient management decisions. A negative result may occur with  improper specimen collection/handling, submission of specimen other than nasopharyngeal swab, presence of viral mutation(s) within the areas targeted by this assay, and inadequate number of viral copies(<138 copies/mL). A negative result must be combined with clinical observations, patient history, and epidemiological information. The expected result is Negative.  Fact Sheet for Patients:  EntrepreneurPulse.com.au  Fact Sheet for Healthcare Providers:  IncredibleEmployment.be  This test is no t yet approved or cleared by the [place] FDA and  has been authorized for detection and/or  diagnosis of SARS-CoV-2 by FDA under an Emergency Use Authorization (EUA). This EUA will remain  in effect (meaning this test can be used) for the duration of the COVID-19 declaration under Section 564(b)(1) of the Act, 21 U.S.C.section 360bbb-3(b)(1), unless the authorization is terminated  or revoked sooner.       Influenza A by PCR NEGATIVE NEGATIVE Final   Influenza B by PCR NEGATIVE NEGATIVE Final    Comment: (NOTE) The Xpert Xpress SARS-CoV-2/FLU/RSV plus assay is intended as an aid in the diagnosis of influenza from Nasopharyngeal swab specimens and should not be used as a sole basis for treatment. Nasal washings and aspirates are unacceptable for  Xpert Xpress SARS-CoV-2/FLU/RSV testing.  Fact Sheet for Patients: EntrepreneurPulse.com.au  Fact Sheet for Healthcare Providers: IncredibleEmployment.be  This test is not yet approved or cleared by the [place] FDA and has been authorized for detection and/or diagnosis of SARS-CoV-2 by FDA under an Emergency Use Authorization (EUA). This EUA will remain in effect (meaning this test can be used) for the duration of the COVID-19 declaration under Section 564(b)(1) of the Act, 21 U.S.C. section 360bbb-3(b)(1), unless the authorization is terminated or revoked.     Resp Syncytial Virus by PCR NEGATIVE NEGATIVE Final    Comment: (NOTE) Fact Sheet for Patients: EntrepreneurPulse.com.au  Fact Sheet for Healthcare Providers: IncredibleEmployment.be  This test is not yet approved or cleared by the [place] FDA and has been authorized for detection and/or diagnosis of SARS-CoV-2 by FDA under an Emergency Use Authorization (EUA). This EUA will remain in effect (meaning this test can be used) for the duration of the COVID-19 declaration under Section 564(b)(1) of the Act, 21 U.S.C. section 360bbb-3(b)(1), unless the authorization is terminated or revoked.  Performed at [facility], [address], [place], [place] [address]      Radiology Studies: DG Abdomen 1 View  Result Date: 12/10/2022 CLINICAL DATA:  Short of breath, multifocal pain EXAM: ABDOMEN - 1 VIEW COMPARISON:  05/05/2022 FINDINGS: Supine frontal view of the abdomen and pelvis excludes the hemidiaphragms and left flank by collimation. Bowel gas pattern is unremarkable without obstruction or ileus. No masses or abnormal calcifications. Stable postsurgical changes of the lumbar spine. Visualized portions of the bony pelvis are normal. IMPRESSION: 1. Unremarkable bowel gas pattern. Electronically Signed   By: Randa Ngo M.D.   On: 12/10/2022  14:10   DG Chest Portable 1 View  Result Date: 12/10/2022 CLINICAL DATA:  Short of breath, multifocal pain EXAM: PORTABLE CHEST 1 VIEW COMPARISON:  09/15/2022 FINDINGS: Single frontal view of the chest demonstrates an enlarged cardiac silhouette. There is increased central vascular congestion, with stable small left pleural effusion and progressive moderate right pleural effusion identified. Bibasilar areas of consolidation may reflect airspace disease or atelectasis. No pneumothorax. No acute bony abnormality. IMPRESSION: 1. Worsening volume status, with increasing central vascular congestion and enlarged right pleural effusion as above. Stable small left pleural effusion. Electronically Signed   By: Randa Ngo M.D.   On: 12/10/2022 14:09    Scheduled Meds:  amiodarone  200 mg Oral Daily   amLODipine  2.5 mg Oral Daily   apixaban  5 mg Oral BID   furosemide  40 mg Intravenous BID   losartan  50 mg Oral Daily   sertraline  25 mg Oral Daily   Continuous Infusions:   LOS: 1 day   Time spent: 35 minutes  Arica B[place], MS4 [facility]  If 7PM-7AM, please contact night-coverage [email or web address] 12/11/2022,  7:12 AM

## 2022-12-11 NOTE — TOC Progression Note (Signed)
  Transition of Care (TOC) Screening Note   Patient Details  Name: Tanya Mueller Date of Birth: [birth year]   Transition of Care [facility]) CM/SW Contact:    Boneta Lucks, RN Phone Number: 12/11/2022, 12:38 PM    Transition of Care Department [facility]) has reviewed patient and no TOC needs have been identified at this time. We will continue to monitor patient advancement through interdisciplinary progression rounds. If new patient transition needs arise, please place a TOC consult.         Expected Discharge Plan and Services      Social Determinants of Health (SDOH) Interventions SDOH Screenings   Food Insecurity: No Food Insecurity (12/10/2022)  Housing: Low Risk  (12/10/2022)  Transportation Needs: No Transportation Needs (12/10/2022)  Utilities: Not At Risk (12/10/2022)  Tobacco Use: Low Risk  (12/10/2022)    Readmission Risk Interventions     No data to display

## 2022-12-11 NOTE — Consult Note (Addendum)
Cardiology Consultation   Patient ID: Tanya Mueller MRN: [id number]; DOB: [birth year]  Admit date: 12/10/2022 Date of Consult: 12/11/2022  PCP:  Manon Hilding, MD   [facility] Cardiologist:  Chalmers Guest, MD        Patient Profile:   Tanya Mueller is a 88 y.o. female with a hx of Atrial fibrillation/flutter who is being seen 12/11/2022 for the evaluation of CHF at the request of Dr. Manuella Ghazi.  History of Present Illness:   Tanya Mueller is a 88 year old F known to have persistent A-fib/new onset atrial flutter, HTN is currently admitted to hospitalist team for management of acute heart failure exacerbation (BNP 427), was given Lasix 40 mg IV x 1.  Home dose of metoprolol was held due to sinus bradycardia.  EKGs on admission showed sinus rhythm, PACs. EKGs from the clinic from 12/06/2022 and 11/21/2022 showed atrial flutter, rate controlled. She continues to feel SOB, a lot better compared to yesterday. Denied any palpitations, chest pain, leg swelling.   Past Medical History:  Diagnosis Date   Allergic rhinitis    Arthritis    Back ,hips   Cancer ([place])    Complication of anesthesia    awareness during general anesthesia.   Dysrhythmia    A-Fib and PSVT   History of atrial fibrillation    episode 2013 converted with cardizem-- happened at time acute illness due to kidney stone --  resolved  no issues since   History of kidney stones    History of sepsis    urosepsis  2013 due to kidney stone   Hypertension    Left nephrolithiasis    Left ureteral calculus    Neuromuscular disorder (HCC)    PAC (premature atrial contraction)    Solitary lung nodule    benign right middle lobe per CT    Past Surgical History:  Procedure Laterality Date   BACK SURGERY  2015   rod and screws   CATARACT EXTRACTION W/ INTRAOCULAR LENS  IMPLANT, BILATERAL  10/02/2008   CYSTOSCOPY WITH BIOPSY N/A 04/08/2019   Procedure: CYSTOSCOPY WITH bladder BIOPSY, FULGURATION;   Surgeon: Festus Aloe, MD;  Location: [facility];  Service: Urology;  Laterality: N/A;   CYSTOSCOPY WITH STENT PLACEMENT Left 09/15/2014   Procedure: CYSTOSCOPY WITH STENT PLACEMENT;  Surgeon: Georgette Dover, MD;  Location[facility];  Service: Urology;  Laterality: Left;   CYSTOSCOPY/RETROGRADE/URETEROSCOPY Left 08/09/2014   Procedure: CYSTOSCOPY/RETROGRADE/LEFT STENT;  Surgeon: Bernestine Amass, MD;  Location: [facility];  Service: Urology;  Laterality: Left;   CYSTOSCOPY/URETEROSCOPY/HOLMIUM LASER/STENT PLACEMENT Left 09/15/2022   Procedure: CYSTOSCOPY LEFT URETEROSCOPY/STONE BASKET EXTRACTION;  Surgeon: Irine Seal, MD;  Location: [facility];  Service: Urology;  Laterality: Left;  1 HR FOR CASE   EXTRACORPOREAL SHOCK WAVE LITHOTRIPSY  10/03/2011   HOLMIUM LASER APPLICATION Left [id number]   Procedure: HOLMIUM LASER APPLICATION;  Surgeon: Georgette Dover, MD;  Location: [facility];  Service: Urology;  Laterality: Left;   IR URETERAL STENT LEFT NEW ACCESS W/O SEP NEPHROSTOMY CATH  08/18/2022   LAPAROTOMY W/  UNILATERAL SALPINGOOPHORECTOMY  age 23   NEPHROLITHOTOMY Left 08/18/2022   Procedure: LEFT NEPHROLITHOTOMY PERCUTANEOUS;  Surgeon: Irine Seal, MD;  Location: [facility];  Service: Urology;  Laterality: Left;  2.5 HRS FOR CASE   TOTAL ABDOMINAL HYSTERECTOMY  age 48   w/ unilateral salpingoophorectomy   TRANSTHORACIC ECHOCARDIOGRAM  02/13/2012   mild LVH/  ef 65%/  trivial MR  &  PR/  mild RVE   URETEROSCOPY Left 09/15/2014   Procedure: URETEROSCOPY;  Surgeon: Georgette Dover, MD;  Location: [facility];  Service: Urology;  Laterality: Left;     Home Medications:  Prior to Admission medications   Medication Sig Start Date End Date Taking? Authorizing Provider  acetaminophen (TYLENOL) 325 MG tablet Take 2 tablets (650 mg total) by mouth every 6 (six) hours as needed for mild pain (or Fever >/= 101). 09/16/22  Yes Emokpae, Courage, MD   amiodarone (PACERONE) 200 MG tablet Take 200 mg by mouth daily.   Yes [provider]  amLODipine (NORVASC) 2.5 MG tablet Take 1 tablet (2.5 mg total) by mouth daily. 08/29/22 08/24/23 Yes Alixandria Friedt P, MD  apixaban (ELIQUIS) 5 MG TABS tablet Take 1 tablet (5 mg total) by mouth 2 (two) times daily. 09/16/22  Yes Emokpae, Courage, MD  fluticasone (FLONASE) 50 MCG/ACT nasal spray Place 2 sprays into the nose daily as needed for allergies.    Yes [provider]  furosemide (LASIX) 20 MG tablet Take 1 tablet (20 mg total) by mouth daily as needed. 12/06/22  Yes Jonee Lamore P, MD  losartan (COZAAR) 50 MG tablet Take 1 tablet (50 mg total) by mouth daily. 08/29/22 08/24/23 Yes Dontavious Emily P, MD  metoprolol tartrate (LOPRESSOR) 50 MG tablet Take 1.5 tablets (75 mg total) by mouth 2 (two) times daily. 09/16/22 09/11/23 Yes Emokpae, Courage, MD  ondansetron (ZOFRAN) 4 MG tablet Take 1 tablet (4 mg total) by mouth every 8 (eight) hours as needed for nausea or vomiting. 04/19/22  Yes Hurshel Keys K, DO  Oxycodone HCl 10 MG TABS Take 10 mg by mouth 4 (four) times daily as needed. 12/01/22  Yes [provider]  oxyCODONE-acetaminophen (PERCOCET) 10-325 MG tablet Take 1 tablet by mouth 4 (four) times daily as needed for pain. 08/28/22  Yes [provider]  polyethylene glycol (MIRALAX / GLYCOLAX) 17 g packet Take 17 g by mouth daily as needed for moderate constipation.   Yes [provider]  sertraline (ZOLOFT) 25 MG tablet Take 25 mg by mouth daily. 10/30/22  Yes [provider]  potassium chloride (KLOR-CON M) 10 MEQ tablet Take 10 mEq by mouth daily. Patient not taking: Reported on 12/10/2022 09/17/22   [provider]  Prucalopride Succinate (MOTEGRITY) 2 MG TABS Take 1 tablet (2 mg total) by mouth daily. 12/07/22   Mahala Menghini, PA-C    Inpatient Medications: Scheduled Meds:  amiodarone  200 mg Oral Daily   amLODipine   2.5 mg Oral Daily   apixaban  5 mg Oral BID   furosemide  40 mg Intravenous BID   losartan  50 mg Oral Daily   sertraline  25 mg Oral Daily   Continuous Infusions:  PRN Meds: acetaminophen **OR** acetaminophen, oxyCODONE-acetaminophen **AND** oxyCODONE, polyethylene glycol  Allergies:    Allergies  Allergen Reactions   Morphine     Nervous    Social History:   Social History   Socioeconomic History   Marital status: Married    Spouse name: Not on file   Number of children: 1   Years of education: Not on file   Highest education level: Not on file  Occupational History    Employer: RETIRED  Tobacco Use   Smoking status: Never    Passive exposure: Never   Smokeless tobacco: Never  Vaping Use   Vaping Use: Never used  Substance and Sexual Activity   Alcohol use:  No   Drug use: No   Sexual activity: Not Currently  Other Topics Concern   Not on file  Social History Narrative   Not on file   Social Determinants of Health   Financial Resource Strain: Not on file  Food Insecurity: No Food Insecurity (12/10/2022)   Hunger Vital Sign    Worried About Running Out of Food in the Last Year: Never true    Ran Out of Food in the Last Year: Never true  Transportation Needs: No Transportation Needs (12/10/2022)   PRAPARE - Hydrologist (Medical): No    Lack of Transportation (Non-Medical): No  Physical Activity: Not on file  Stress: Not on file  Social Connections: Not on file  Intimate Partner Violence: Not At Risk (12/10/2022)   Humiliation, Afraid, Rape, and Kick questionnaire    Fear of Current or Ex-Partner: No    Emotionally Abused: No    Physically Abused: No    Sexually Abused: No    Family History:     Family History  Problem Relation Age of Onset   Cancer Other        Lung and bladder   Cancer Other        "Bone marrow cancer"     ROS:  Please see the history of present illness.  Review of Systems  Constitutional:  Negative.  HENT: Negative.    Eyes: Negative.   Cardiovascular:  Positive for dyspnea on exertion.  Respiratory:  Positive for shortness of breath.   Hematologic/Lymphatic: Negative.   Musculoskeletal:  Positive for arthritis, back pain, neck pain and stiffness. Negative for joint pain.  Gastrointestinal: Negative.   Genitourinary: Negative.   Neurological: Negative.     All other ROS reviewed and negative.     Physical Exam/Data:   Vitals:   12/10/22 2037 12/11/22 0011 12/11/22 0451 12/11/22 0458  BP: (!) 174/65 (!) 142/63 (!) 116/49   Pulse: (!) 55 (!) 59 (!) 54   Resp: '20 18 16   '$ Temp: 97.8 F (36.6 C) 98.1 F (36.7 C) 98.5 F (36.9 C)   TempSrc: Oral Oral Oral   SpO2: 94% 92% 93%   Weight: 65.8 kg   65.3 kg  Height:        Intake/Output Summary (Last 24 hours) at 12/11/2022 0755 Last data filed at 12/11/2022 0000 Gross per 24 hour  Intake --  Output 450 ml  Net -450 ml      12/11/2022    4:58 AM 12/10/2022    8:37 PM 12/10/2022   12:57 PM  Last 3 Weights  Weight (lbs) 143 lb 15.4 oz 145 lb 1 oz 155 lb  Weight (kg) 65.3 kg 65.8 kg 70.308 kg     Body mass index is 23.96 kg/m.  General:Thin,, in no acute distress  HEENT: normal Neck: no JVD Vascular: No carotid bruits; Distal pulses 2+ bilaterally Cardiac:  normal S1, S2; RRR; some skipping 1/6 systolic murmur LSB Lungs:  clear to auscultation bilaterally, no wheezing, rhonchi or rales  Abd: soft, nontender, no hepatomegaly  Ext: no edema Musculoskeletal:  No deformities, BUE and BLE strength normal and equal Skin: warm and dry  Neuro:  CNs 2-12 intact, no focal abnormalities noted Psych:  Normal affect   EKG:  The EKG was personally reviewed and demonstrates:  appears to be Sinus bradycardia with nonspecific ST changes Telemetry:  Telemetry was personally reviewed and demonstrates:  NSR with PAC's some Afib  Relevant CV Studies:  Echo 09/2022 IMPRESSIONS     1. Left ventricular ejection fraction, by  estimation, is 55 to 60%. The  left ventricle has normal function. The left ventricle has no regional  wall motion abnormalities. Left ventricular diastolic parameters are  indeterminate.   2. Right ventricular systolic function is normal. The right ventricular  size is moderately enlarged.   3. Left atrial size was mildly dilated.   4. The mitral valve is normal in structure. Mild mitral valve  regurgitation. No evidence of mitral stenosis.   5. The aortic valve was not well visualized. Aortic valve regurgitation  is not visualized. No aortic stenosis is present.   6. The inferior vena cava is normal in size with <50% respiratory  variability, suggesting right atrial pressure of 8 mmHg.   Comparison(s): Similar to 2018 Report.  Laboratory Data:  High Sensitivity Troponin:   Recent Labs  Lab 12/10/22 1342 12/10/22 1544  TROPONINIHS 8 7     Chemistry Recent Labs  Lab 12/10/22 1342 12/11/22 0456  NA 134* 137  K 2.9* 3.5  CL 99 101  CO2 26 29  GLUCOSE 126* 110*  BUN 7* 7*  CREATININE 0.81 0.93  CALCIUM 8.6* 8.3*  MG 1.8  --   GFRNONAA >60 >60  ANIONGAP 9 7    Recent Labs  Lab 12/10/22 1342  PROT 7.9  ALBUMIN 3.6  AST 29  ALT 12  ALKPHOS 68  BILITOT 0.9   Lipids No results for input(s): "CHOL", "TRIG", "HDL", "LABVLDL", "LDLCALC", "CHOLHDL" in the last 168 hours.  Hematology Recent Labs  Lab 12/10/22 1342  WBC 6.4  RBC 4.28  HGB 14.4  HCT 43.6  MCV 101.9*  MCH 33.6  MCHC 33.0  RDW 13.4  PLT 288   Thyroid No results for input(s): "TSH", "FREET4" in the last 168 hours.  BNP Recent Labs  Lab 12/10/22 1342  BNP 427.0*    DDimer No results for input(s): "DDIMER" in the last 168 hours.   Radiology/Studies:  DG Abdomen 1 View  Result Date: 12/10/2022 CLINICAL DATA:  Short of breath, multifocal pain EXAM: ABDOMEN - 1 VIEW COMPARISON:  05/05/2022 FINDINGS: Supine frontal view of the abdomen and pelvis excludes the hemidiaphragms and left flank by  collimation. Bowel gas pattern is unremarkable without obstruction or ileus. No masses or abnormal calcifications. Stable postsurgical changes of the lumbar spine. Visualized portions of the bony pelvis are normal. IMPRESSION: 1. Unremarkable bowel gas pattern. Electronically Signed   By: Randa Ngo M.D.   On: 12/10/2022 14:10   DG Chest Portable 1 View  Result Date: 12/10/2022 CLINICAL DATA:  Short of breath, multifocal pain EXAM: PORTABLE CHEST 1 VIEW COMPARISON:  09/15/2022 FINDINGS: Single frontal view of the chest demonstrates an enlarged cardiac silhouette. There is increased central vascular congestion, with stable small left pleural effusion and progressive moderate right pleural effusion identified. Bibasilar areas of consolidation may reflect airspace disease or atelectasis. No pneumothorax. No acute bony abnormality. IMPRESSION: 1. Worsening volume status, with increasing central vascular congestion and enlarged right pleural effusion as above. Stable small left pleural effusion. Electronically Signed   By: Randa Ngo M.D.   On: 12/10/2022 14:09     Assessment and Plan:   # Acute hypoxic respiratory failure secondary to acute diastolic heart failure exacerbation -Continue IV Lasix 40 mg twice daily  # Atrial fibrillation and atrial flutter, currently in NSR -Discontinue home dose of metoprolol upon discharge -Continue amiodarone 200 mg once daily  -Continue Eliquis 5  mg twice daily  # Prolonged QTc interval likely secondary to hypokalemia -Patient had prolonged QTc, 541 ms on admission secondary to hypokalemia, K2.9. After repleting potassium, repeat EKG from today showed normal QTc interval, in NSR with PACs.  # HTN on amlodipine, losartan  I have spent a total of 70 minutes with patient reviewing chart , telemetry, EKGs, labs and examining patient as well as establishing an assessment and plan that was discussed with the patient.  > 50% of time was spent in direct patient  care.    Abdallah Hern Fidel Levy, MD [place]  12:32 PM

## 2022-12-12 DIAGNOSIS — I5033 Acute on chronic diastolic (congestive) heart failure: Secondary | ICD-10-CM | POA: Diagnosis not present

## 2022-12-12 DIAGNOSIS — G8929 Other chronic pain: Secondary | ICD-10-CM | POA: Diagnosis not present

## 2022-12-12 DIAGNOSIS — J9601 Acute respiratory failure with hypoxia: Secondary | ICD-10-CM | POA: Diagnosis not present

## 2022-12-12 DIAGNOSIS — R9431 Abnormal electrocardiogram [ECG] [EKG]: Secondary | ICD-10-CM | POA: Diagnosis not present

## 2022-12-12 LAB — BASIC METABOLIC PANEL
Anion gap: 10 (ref 5–15)
BUN: 9 mg/dL (ref 8–23)
CO2: 29 mmol/L (ref 22–32)
Calcium: 8.6 mg/dL — ABNORMAL LOW (ref 8.9–10.3)
Chloride: 98 mmol/L (ref 98–111)
Creatinine, Ser: 1.02 mg/dL — ABNORMAL HIGH (ref 0.44–1.00)
GFR, Estimated: 54 mL/min — ABNORMAL LOW (ref >60)
Glucose, Bld: 98 mg/dL (ref 70–99)
Potassium: 3.1 mmol/L — ABNORMAL LOW (ref 3.5–5.1)
Sodium: 137 mmol/L (ref 135–145)

## 2022-12-12 LAB — MAGNESIUM: Magnesium: 1.8 mg/dL (ref 1.7–2.4)

## 2022-12-12 MED ORDER — POTASSIUM CHLORIDE CRYS ER 20 MEQ PO TBCR
40.0000 meq | EXTENDED_RELEASE_TABLET | Freq: Two times a day (BID) | ORAL | Status: DC
  Administered 2022-12-12: 40 meq via ORAL
  Filled 2022-12-12: qty 2

## 2022-12-12 MED ORDER — FUROSEMIDE 20 MG PO TABS: 40.0000 mg | ORAL_TABLET | Freq: Every day | ORAL | 3 refills | Status: DC

## 2022-12-12 NOTE — Progress Notes (Addendum)
Discharge instructions given on medications and follow up visits,patient,and family verbalized understanding. Prescriptions sent to Pharmacy of choice documented on AVS. IV discontinued,catheter intact.Accompanied by staff to an awaiting vehicle.

## 2022-12-12 NOTE — Progress Notes (Signed)
Patient being discharged today. Will arrange 2-3 week cardiology f/u.

## 2022-12-12 NOTE — Discharge Summary (Signed)
Physician Discharge Summary  Tanya Mueller [id number] DOB: [birth year] DOA: 12/10/2022  PCP: Manon Hilding, MD  Admit date: 12/10/2022  Discharge date: 12/12/2022  Admitted From: Home  Disposition: Home  Recommendations for Outpatient Follow-up:  Follow up with PCP in 1-2 weeks Please obtain BMP/CBC in one week Discontinued metoprolol at discharge per Cardiology due to bradycardia f/u with Cardiologist Dr. [place] Calix in [facility] 3/28 @ 11 am, scheduled  Home Health: None  Equipment/Devices: None  Discharge Condition: Stable  CODE STATUS: Full  Diet recommendation: Heart Healthy  Brief/Interim Summary: Tanya Mueller is a 88 y.o. female with medical history significant for paroxysmal atrial fibrillation, hypertension, nephrolithiasis presenting to the ED on 3/10 with complaints of neck/shoulder pain, back pain, left lower quadrant abdominal pain, and generalized weakness.  Was in atrial fibrillation and bradycardic, prolonged QTc.  Found to be hypokalemic and repleted.  BNP was elevated and CXR showed vascular congestion, right pleural effusion.  Treated with IV Lasix and admitted to hospitalist service for acute hypoxic respiratory failure.  Respiratory status improved with diuresis and patient was able to ambulate on room air if O2 sats above 88% the following day.  QTc interval normalized with electrolyte repletion.  Patient was seen by cardiology, who signed off on patient after diuresis course and greatly improved respiratory/volume status.  Discharge Diagnoses:  Principal Problem:   Acute respiratory failure with hypoxia (HCC) Active Problems:   Hypokalemia   Essential hypertension   Paroxysmal atrial fibrillation with RVR (HCC)   Acute on chronic diastolic CHF (congestive heart failure) (HCC)   Chronic pain   Prolonged QT interval  Hospitalized with acute hypoxic respiratory failure in the setting of acute on chronic diastolic heart failure exacerbation.  Discharge  Instructions  Discharge Instructions     Diet - low sodium heart healthy   Complete by: As directed    Increase activity slowly   Complete by: As directed       Allergies as of 12/12/2022       Reactions   Morphine    Nervous        Medication List     STOP taking these medications    metoprolol tartrate 50 MG tablet Commonly known as: LOPRESSOR       TAKE these medications    acetaminophen 325 MG tablet Commonly known as: TYLENOL Take 2 tablets (650 mg total) by mouth every 6 (six) hours as needed for mild pain (or Fever >/= 101).   amiodarone 200 MG tablet Commonly known as: PACERONE Take 200 mg by mouth daily.   amLODipine 2.5 MG tablet Commonly known as: NORVASC Take 1 tablet (2.5 mg total) by mouth daily.   apixaban 5 MG Tabs tablet Commonly known as: ELIQUIS Take 1 tablet (5 mg total) by mouth 2 (two) times daily.   fluticasone 50 MCG/ACT nasal spray Commonly known as: FLONASE Place 2 sprays into the nose daily as needed for allergies.   furosemide 20 MG tablet Commonly known as: LASIX Take 1 tablet (20 mg total) by mouth daily as needed.   losartan 50 MG tablet Commonly known as: COZAAR Take 1 tablet (50 mg total) by mouth daily.   Motegrity 2 MG Tabs Generic drug: Prucalopride Succinate Take 1 tablet (2 mg total) by mouth daily.   ondansetron 4 MG tablet Commonly known as: ZOFRAN Take 1 tablet (4 mg total) by mouth every 8 (eight) hours as needed for nausea or vomiting.   Oxycodone HCl 10 MG Tabs Take  10 mg by mouth 4 (four) times daily as needed.   oxyCODONE-acetaminophen 10-325 MG tablet Commonly known as: PERCOCET Take 1 tablet by mouth 4 (four) times daily as needed for pain.   polyethylene glycol 17 g packet Commonly known as: MIRALAX / GLYCOLAX Take 17 g by mouth daily as needed for moderate constipation.   potassium chloride 10 MEQ tablet Commonly known as: KLOR-CON M Take 10 mEq by mouth daily.   sertraline 25 MG  tablet Commonly known as: ZOLOFT Take 25 mg by mouth daily.        Follow-up Information     Sasser, Silvestre Moment, MD. Schedule an appointment as soon as possible for a visit in 4 week(s).   Specialty: Family Medicine Contact information: [address] [address] [phone]         Finis Bud, NP Follow up on 12/28/2022.   Specialty: Cardiology Contact information: [address] [place] [address] [phone]                Allergies  Allergen Reactions   Morphine     Nervous    Consultations: Cardiology  Procedures/Studies: DG Abdomen 1 View  Result Date: 12/10/2022 CLINICAL DATA:  Short of breath, multifocal pain EXAM: ABDOMEN - 1 VIEW COMPARISON:  05/05/2022 FINDINGS: Supine frontal view of the abdomen and pelvis excludes the hemidiaphragms and left flank by collimation. Bowel gas pattern is unremarkable without obstruction or ileus. No masses or abnormal calcifications. Stable postsurgical changes of the lumbar spine. Visualized portions of the bony pelvis are normal. IMPRESSION: 1. Unremarkable bowel gas pattern. Electronically Signed   By: Randa Ngo M.D.   On: 12/10/2022 14:10   DG Chest Portable 1 View  Result Date: 12/10/2022 CLINICAL DATA:  Short of breath, multifocal pain EXAM: PORTABLE CHEST 1 VIEW COMPARISON:  09/15/2022 FINDINGS: Single frontal view of the chest demonstrates an enlarged cardiac silhouette. There is increased central vascular congestion, with stable small left pleural effusion and progressive moderate right pleural effusion identified. Bibasilar areas of consolidation may reflect airspace disease or atelectasis. No pneumothorax. No acute bony abnormality. IMPRESSION: 1. Worsening volume status, with increasing central vascular congestion and enlarged right pleural effusion as above. Stable small left pleural effusion. Electronically Signed   By: Randa Ngo M.D.   On: 12/10/2022 14:09   Ultrasound renal  complete  Result Date: 11/22/2022 CLINICAL DATA:  History of left kidney stones.  Recurrent UTI. EXAM: RENAL / URINARY TRACT ULTRASOUND COMPLETE COMPARISON:  None Available. FINDINGS: Right Kidney: Renal measurements: 9.7 x 5.4 x 4.4 cm = volume: 121.2 mL. Echogenicity within normal limits. No mass or hydronephrosis visualized. Left Kidney: Renal measurements: 10.3 x 5.2 x 4.4 cm = volume: 123 mL. Echogenicity within normal limits. No mass or hydronephrosis visualized. Bladder: Appears normal for degree of bladder distention. Other: None. IMPRESSION: Normal study. Electronically Signed   By: Dorise Bullion III M.D.   On: 11/22/2022 12:42     Discharge Exam: Vitals:   12/12/22 0622 12/12/22 0905  BP: (!) 108/55 (!) 117/58  Pulse: 61 71  Resp: 20   Temp: 97.9 F (36.6 C) 98.5 F (36.9 C)  SpO2: 92% 95%   Vitals:   12/11/22 2129 12/12/22 0500 12/12/22 0622 12/12/22 0905  BP: (!) 167/63  (!) 108/55 (!) 117/58  Pulse: 64  61 71  Resp: 20  20   Temp: 97.6 F (36.4 C)  97.9 F (36.6 C) 98.5 F (36.9 C)  TempSrc: Oral  Oral Oral  SpO2: 97%  92% 95%  Weight:  64 kg    Height:        General: Adult female resting on edge of bed. HEENT: MMM.  NCAT.  No scleral icterus or mucosal pallor. Lungs: Diminished breath sounds at right lung base, dullness to percussion.  Otherwise CTAB.  Normal work of breathing on room air. Cardiovascular: Regular rate, irregular rhythm.  No JVD.  Normal S1/2.  No murmurs/rubs/gallops. Abdomen: Soft, nontender.  Nondistended. GU: No Foley. Extremities: No pitting edema.  No cyanosis or clubbing. Skin: Warm, dry.  No rashes or lesions grossly. Neuro: Alert and oriented x 4.  No focal neurological deficit.   The results of significant diagnostics from this hospitalization (including imaging, microbiology, ancillary and laboratory) are listed below for reference.    Microbiology: Recent Results (from the past 240 hour(s))  Resp panel by RT-PCR (RSV, Flu A&B,  Covid) Anterior Nasal Swab     Status: None   Collection Time: 12/10/22  2:34 PM   Specimen: Anterior Nasal Swab  Result Value Ref Range Status   SARS Coronavirus 2 by RT PCR NEGATIVE NEGATIVE Final    Comment: (NOTE) SARS-CoV-2 target nucleic acids are NOT DETECTED.  The SARS-CoV-2 RNA is generally detectable in upper respiratory specimens during the acute phase of infection. The lowest concentration of SARS-CoV-2 viral copies this assay can detect is 138 copies/mL. A negative result does not preclude SARS-Cov-2 infection and should not be used as the sole basis for treatment or other patient management decisions. A negative result may occur with  improper specimen collection/handling, submission of specimen other than nasopharyngeal swab, presence of viral mutation(s) within the areas targeted by this assay, and inadequate number of viral copies(<138 copies/mL). A negative result must be combined with clinical observations, patient history, and epidemiological information. The expected result is Negative.  Fact Sheet for Patients:  EntrepreneurPulse.com.au  Fact Sheet for Healthcare Providers:  IncredibleEmployment.be  This test is no t yet approved or cleared by the [place] FDA and  has been authorized for detection and/or diagnosis of SARS-CoV-2 by FDA under an Emergency Use Authorization (EUA). This EUA will remain  in effect (meaning this test can be used) for the duration of the COVID-19 declaration under Section 564(b)(1) of the Act, 21 U.S.C.section 360bbb-3(b)(1), unless the authorization is terminated  or revoked sooner.       Influenza A by PCR NEGATIVE NEGATIVE Final   Influenza B by PCR NEGATIVE NEGATIVE Final    Comment: (NOTE) The Xpert Xpress SARS-CoV-2/FLU/RSV plus assay is intended as an aid in the diagnosis of influenza from Nasopharyngeal swab specimens and should not be used as a sole basis for treatment. Nasal  washings and aspirates are unacceptable for Xpert Xpress SARS-CoV-2/FLU/RSV testing.  Fact Sheet for Patients: EntrepreneurPulse.com.au  Fact Sheet for Healthcare Providers: IncredibleEmployment.be  This test is not yet approved or cleared by the [place] FDA and has been authorized for detection and/or diagnosis of SARS-CoV-2 by FDA under an Emergency Use Authorization (EUA). This EUA will remain in effect (meaning this test can be used) for the duration of the COVID-19 declaration under Section 564(b)(1) of the Act, 21 U.S.C. section 360bbb-3(b)(1), unless the authorization is terminated or revoked.     Resp Syncytial Virus by PCR NEGATIVE NEGATIVE Final    Comment: (NOTE) Fact Sheet for Patients: EntrepreneurPulse.com.au  Fact Sheet for Healthcare Providers: IncredibleEmployment.be  This test is not yet approved or cleared by the  [place] Architectural technologist and has been authorized for detection and/or diagnosis of SARS-CoV-2 by FDA under an Print production planner (EUA). This EUA will remain in effect (meaning this test can be used) for the duration of the COVID-19 declaration under Section 564(b)(1) of the Act, 21 U.S.C. section 360bbb-3(b)(1), unless the authorization is terminated or revoked.  Performed at [facility], [address], [place], [place] [address]      Labs: BNP (last 3 results) Recent Labs    09/15/22 1817 12/10/22 1342  BNP 673.0* [id number]*   Basic Metabolic Panel: Recent Labs  Lab 12/10/22 1342 12/11/22 0456 12/12/22 0438  NA 134* 137 137  K 2.9* 3.5 3.1*  CL 99 101 98  CO2 '26 29 29  '$ GLUCOSE 126* 110* 98  BUN 7* 7* 9  CREATININE 0.81 0.93 1.02*  CALCIUM 8.6* 8.3* 8.6*  MG 1.8 1.8 1.8   Liver Function Tests: Recent Labs  Lab 12/10/22 1342  AST 29  ALT 12  ALKPHOS 68  BILITOT 0.9  PROT 7.9  ALBUMIN 3.6   Recent Labs  Lab 12/10/22 1342  LIPASE 25   No  results for input(s): "AMMONIA" in the last 168 hours. CBC: Recent Labs  Lab 12/10/22 1342  WBC 6.4  NEUTROABS 4.6  HGB 14.4  HCT 43.6  MCV 101.9*  PLT 288   Cardiac Enzymes: No results for input(s): "CKTOTAL", "CKMB", "CKMBINDEX", "TROPONINI" in the last 168 hours. BNP: Invalid input(s): "POCBNP" CBG: No results for input(s): "GLUCAP" in the last 168 hours. D-Dimer No results for input(s): "DDIMER" in the last 72 hours. Hgb A1c No results for input(s): "HGBA1C" in the last 72 hours. Lipid Profile No results for input(s): "CHOL", "HDL", "LDLCALC", "TRIG", "CHOLHDL", "LDLDIRECT" in the last 72 hours. Thyroid function studies No results for input(s): "TSH", "T4TOTAL", "T3FREE", "THYROIDAB" in the last 72 hours.  Invalid input(s): "FREET3" Anemia work up No results for input(s): "VITAMINB12", "FOLATE", "FERRITIN", "TIBC", "IRON", "RETICCTPCT" in the last 72 hours. Urinalysis    Component Value Date/Time   COLORURINE YELLOW 12/10/2022 1440   APPEARANCEUR CLEAR 12/10/2022 1440   APPEARANCEUR Hazy (A) 11/30/2022 1336   LABSPEC 1.010 12/10/2022 1440   PHURINE 7.0 12/10/2022 1440   GLUCOSEU NEGATIVE 12/10/2022 1440   HGBUR LARGE (A) 12/10/2022 1440   BILIRUBINUR NEGATIVE 12/10/2022 1440   BILIRUBINUR Negative 11/30/2022 1336   KETONESUR NEGATIVE 12/10/2022 1440   PROTEINUR NEGATIVE 12/10/2022 1440   UROBILINOGEN 0.2 08/09/2014 1230   NITRITE NEGATIVE 12/10/2022 1440   LEUKOCYTESUR NEGATIVE 12/10/2022 1440   Sepsis Labs Recent Labs  Lab 12/10/22 1342  WBC 6.4   Microbiology Recent Results (from the past 240 hour(s))  Resp panel by RT-PCR (RSV, Flu A&B, Covid) Anterior Nasal Swab     Status: None   Collection Time: 12/10/22  2:34 PM   Specimen: Anterior Nasal Swab  Result Value Ref Range Status   SARS Coronavirus 2 by RT PCR NEGATIVE NEGATIVE Final    Comment: (NOTE) SARS-CoV-2 target nucleic acids are NOT DETECTED.  The SARS-CoV-2 RNA is generally detectable in  upper respiratory specimens during the acute phase of infection. The lowest concentration of SARS-CoV-2 viral copies this assay can detect is 138 copies/mL. A negative result does not preclude SARS-Cov-2 infection and should not be used as the sole basis for treatment or other patient management decisions. A negative result may occur with  improper specimen collection/handling, submission of specimen other than nasopharyngeal swab, presence of viral mutation(s) within the areas targeted by this  assay, and inadequate number of viral copies(<138 copies/mL). A negative result must be combined with clinical observations, patient history, and epidemiological information. The expected result is Negative.  Fact Sheet for Patients:  EntrepreneurPulse.com.au  Fact Sheet for Healthcare Providers:  IncredibleEmployment.be  This test is no t yet approved or cleared by the [place] FDA and  has been authorized for detection and/or diagnosis of SARS-CoV-2 by FDA under an Emergency Use Authorization (EUA). This EUA will remain  in effect (meaning this test can be used) for the duration of the COVID-19 declaration under Section 564(b)(1) of the Act, 21 U.S.C.section 360bbb-3(b)(1), unless the authorization is terminated  or revoked sooner.       Influenza A by PCR NEGATIVE NEGATIVE Final   Influenza B by PCR NEGATIVE NEGATIVE Final    Comment: (NOTE) The Xpert Xpress SARS-CoV-2/FLU/RSV plus assay is intended as an aid in the diagnosis of influenza from Nasopharyngeal swab specimens and should not be used as a sole basis for treatment. Nasal washings and aspirates are unacceptable for Xpert Xpress SARS-CoV-2/FLU/RSV testing.  Fact Sheet for Patients: EntrepreneurPulse.com.au  Fact Sheet for Healthcare Providers: IncredibleEmployment.be  This test is not yet approved or cleared by the [place] FDA and has been  authorized for detection and/or diagnosis of SARS-CoV-2 by FDA under an Emergency Use Authorization (EUA). This EUA will remain in effect (meaning this test can be used) for the duration of the COVID-19 declaration under Section 564(b)(1) of the Act, 21 U.S.C. section 360bbb-3(b)(1), unless the authorization is terminated or revoked.     Resp Syncytial Virus by PCR NEGATIVE NEGATIVE Final    Comment: (NOTE) Fact Sheet for Patients: EntrepreneurPulse.com.au  Fact Sheet for Healthcare Providers: IncredibleEmployment.be  This test is not yet approved or cleared by the [place] FDA and has been authorized for detection and/or diagnosis of SARS-CoV-2 by FDA under an Emergency Use Authorization (EUA). This EUA will remain in effect (meaning this test can be used) for the duration of the COVID-19 declaration under Section 564(b)(1) of the Act, 21 U.S.C. section 360bbb-3(b)(1), unless the authorization is terminated or revoked.  Performed at [facility], [address] [place], [place] [address]      Time coordinating discharge: 35 minutes  SIGNED:   Shequila Neglia Katy Fitch, MS4 [facility] 12/12/2022, 10:50 AM  If 7PM-7AM, please contact night-coverage [email or web address]

## 2022-12-12 NOTE — Progress Notes (Signed)
Progress Note  Patient Name: Tanya Mueller Date of Encounter: 12/12/2022  Primary Cardiologist: Chalmers Guest, MD  Subjective   No acute events overnight, patient continues to be in sinus bradycardia on telemetry, heart rate in 50s with PACs. She walked in the hallway with no symptoms of SOB.  Inpatient Medications    Scheduled Meds:  amiodarone  200 mg Oral Daily   amLODipine  2.5 mg Oral Daily   apixaban  5 mg Oral BID   furosemide  40 mg Intravenous BID   Living Better with Heart Failure Book   Does not apply Once   losartan  50 mg Oral Daily   potassium chloride  40 mEq Oral BID   sertraline  25 mg Oral Daily   Continuous Infusions:  PRN Meds: acetaminophen **OR** acetaminophen, cyclobenzaprine, oxyCODONE-acetaminophen **AND** oxyCODONE, polyethylene glycol   Vital Signs    Vitals:   12/11/22 2129 12/12/22 0500 12/12/22 0622 12/12/22 0905  BP: (!) 167/63  (!) 108/55 (!) 117/58  Pulse: 64  61 71  Resp: 20  20   Temp: 97.6 F (36.4 C)  97.9 F (36.6 C) 98.5 F (36.9 C)  TempSrc: Oral  Oral Oral  SpO2: 97%  92% 95%  Weight:  64 kg    Height:        Intake/Output Summary (Last 24 hours) at 12/12/2022 1406 Last data filed at 12/12/2022 0900 Gross per 24 hour  Intake 720 ml  Output 200 ml  Net 520 ml   Filed Weights   12/10/22 2037 12/11/22 0458 12/12/22 0500  Weight: 65.8 kg 65.3 kg 64 kg    Telemetry     Personally reviewed, normal sinus rhythm, sinus bradycardia with PACs  ECG    Not performed today  Physical Exam   GEN: No acute distress.   Neck: No JVD. Cardiac: RRR, no murmur, rub, or gallop.  Respiratory: Nonlabored. Clear to auscultation bilaterally. GI: Soft, nontender, bowel sounds present. MS: No edema; No deformity. Neuro:  Nonfocal. Psych: Alert and oriented x 3. Normal affect.  Labs    Chemistry Recent Labs  Lab 12/10/22 1342 12/11/22 0456 12/12/22 0438  NA 134* 137 137  K 2.9* 3.5 3.1*  CL 99 101 98  CO2 '26 29  29  '$ GLUCOSE 126* 110* 98  BUN 7* 7* 9  CREATININE 0.81 0.93 1.02*  CALCIUM 8.6* 8.3* 8.6*  PROT 7.9  --   --   ALBUMIN 3.6  --   --   AST 29  --   --   ALT 12  --   --   ALKPHOS 68  --   --   BILITOT 0.9  --   --   GFRNONAA >60 >60 54*  ANIONGAP '9 7 10     '$ Hematology Recent Labs  Lab 12/10/22 1342  WBC 6.4  RBC 4.28  HGB 14.4  HCT 43.6  MCV 101.9*  MCH 33.6  MCHC 33.0  RDW 13.4  PLT 288    Cardiac Enzymes Recent Labs  Lab 12/10/22 1342 12/10/22 1544  TROPONINIHS 8 7    BNP Recent Labs  Lab 12/10/22 1342  BNP 427.0*     DDimerNo results for input(s): "DDIMER" in the last 168 hours.   Radiology    No results found.   Assessment & Plan   # Acute hypoxic respiratory failure secondary to acute diastolic heart failure exacerbation, compensated -Switch IV to p.o. Lasix 40 mg once daily  # Atrial fibrillation and  atrial flutter, currently in NSR -Discontinue home dose of metoprolol on discharge -Continue amiodarone 200 mg once daily -Continue Eliquis 5 mg twice daily -Patient will be followed up in cardiology clinic with APP in 2 weeks upon discharge.  # HTN on amlodipine, losartan  I have spent a total of 33 minutes with patient reviewing chart, EKGs, labs and examining patient as well as establishing an assessment and plan that was discussed with the patient.  > 50% of time was spent in direct patient care.     Signed, Chalmers Guest, MD  12/12/2022, 2:06 PM

## 2022-12-12 NOTE — Progress Notes (Signed)
Mobility Specialist Progress Note:    12/12/22 1223  Mobility  Activity Ambulated with assistance in hallway  Level of Assistance Standby assist, set-up cues, supervision of patient - no hands on  Assistive Device Front wheel walker  Distance Ambulated (ft) 260 ft  Activity Response Tolerated well  Mobility Referral Yes  $Mobility charge 1 Mobility   Pt agreeable for mobility session. Tolerated well, asx throughout. SpO2 98% on RA during ambulation in hallway. Returned pt to room, sitting up in room, daughter in room, all needs met.   Royetta Crochet Mobility Specialist Please contact via Solicitor or  Rehab office at [phone]

## 2022-12-12 NOTE — TOC Transition Note (Signed)
Transition of Care [facility]) - CM/SW Discharge Note   Patient Details  Name: Tanya Mueller MRN: [id number] Date of Birth: Dec 28, 1934  Transition of Care [facility]) CM/SW Contact:  Boneta Lucks, RN Phone Number: 12/12/2022, 11:22 AM   Clinical Narrative:   Patient discharging home. TOC consulted to CHF. Patient is active with [facility]. CM called her daughter, they are agreeable to [facility] NP calling for CHF education. [facility] consult order placed.      Barriers to Discharge: Continued Medical Work up   Patient Goals and CMS Choice CMS Medicare.gov Compare Post Acute Care list provided to:: Patient    Discharge Placement    Name of family member notified: Daugher Patient and family notified of of transfer: 12/12/22  Discharge Plan and Services Additional resources added to the After Visit Summary for          Social Determinants of Health (SDOH) Interventions SDOH Screenings   Food Insecurity: No Food Insecurity (12/10/2022)  Housing: Low Risk  (12/10/2022)  Transportation Needs: No Transportation Needs (12/10/2022)  Utilities: Not At Risk (12/10/2022)  Tobacco Use: Low Risk  (12/10/2022)

## 2022-12-12 NOTE — Care Management Important Message (Signed)
Important Message  Patient Details  Name: Tanya Mueller MRN: [id number] Date of Birth: December 07, 1934   Medicare Important Message Given:  N/A - LOS <3 / Initial given by admissions     Tommy Medal 12/12/2022, 10:10 AM

## 2022-12-13 ENCOUNTER — Ambulatory Visit: Payer: Self-pay | Admitting: *Deleted

## 2022-12-13 ENCOUNTER — Encounter: Payer: Self-pay | Admitting: *Deleted

## 2022-12-14 ENCOUNTER — Telehealth: Payer: Self-pay | Admitting: Internal Medicine

## 2022-12-14 ENCOUNTER — Encounter: Payer: Self-pay | Admitting: *Deleted

## 2022-12-14 MED ORDER — POTASSIUM CHLORIDE CRYS ER 10 MEQ PO TBCR: 10.0000 meq | EXTENDED_RELEASE_TABLET | Freq: Every day | ORAL | 1 refills | Status: DC

## 2022-12-14 NOTE — Telephone Encounter (Signed)
Done

## 2022-12-14 NOTE — Telephone Encounter (Signed)
*  STAT* If patient is at the pharmacy, call can be transferred to refill team.   1. Which medications need to be refilled? (please list name of each medication and dose if known) potassium chloride (KLOR-CON M) 10 MEQ tablet   2. Which pharmacy/location (including street and city if local pharmacy) is medication to be sent to? [place], [place]   3. Do they need a 30 day or 90 day supply? [place]

## 2022-12-14 NOTE — Patient Outreach (Addendum)
  Care Coordination   Home Visit Note   12/14/2022 Name: Tanya Mueller MRN: [id number] DOB: [birth year]  Tanya Mueller is a 88 y.o. year old female who sees Sasser, Silvestre Moment, MD for primary care. I visited  [name] in their home today.  What matters to the patients health and wellness today?  Learning how to manage this new problem: HF.   Wt 140#, Heart: RRR, Lungs clear, No edema.   Goals Addressed               This Visit's Progress     Patient Stated     I will follow the HF Action Plan over the next 30 days. (pt-stated)        Interventions Today    Flowsheet Row Most Recent Value  Chronic Disease   Chronic disease during today's visit Congestive Heart Failure (CHF)  General Interventions   General Interventions Discussed/Reviewed General Interventions Discussed, General Interventions Reviewed  [HF action plan outline provided and explained to pt and daughter.]  Exercise Interventions   Exercise Discussed/Reviewed Exercise Discussed, Exercise Reviewed, Physical Activity  Physical Activity Discussed/Reviewed Physical Activity Discussed, Physical Activity Reviewed  [Encouraged flexibility exercises and walking.]  Education Interventions   Education Provided Provided Education  Provided Verbal Education On Exercise, Medication, When to see the doctor, Nutrition  Mental Health Interventions   Mental Health Discussed/Reviewed Mental Health Discussed, Mental Health Reviewed, Depression  Nutrition Interventions   Nutrition Discussed/Reviewed Nutrition Discussed, Nutrition Reviewed, Decreasing salt  Pharmacy Interventions   Pharmacy Dicussed/Reviewed Medication Adherence  Safety Interventions   Safety Discussed/Reviewed Safety Discussed, Safety Reviewed, Fall Risk, Home Safety  Advanced Directive Interventions   Advanced Directives Discussed/Reviewed Advanced Directives Discussed, Provided resource for acquiring and filling out documents  [Pt has old documents. Provided new  docs for updates.]              SDOH assessments and interventions completed:  Yes  SDOH Interventions Today    Flowsheet Row Most Recent Value  SDOH Interventions   Food Insecurity Interventions Intervention Not Indicated  Housing Interventions Intervention Not Indicated  Transportation Interventions Intervention Not Indicated  Utilities Interventions Intervention Not Indicated  Alcohol Usage Interventions Intervention Not Indicated (Score <7)  Financial Strain Interventions Intervention Not Indicated  Physical Activity Interventions Other (Comments)  [Pt has chronic back pain. Counseled on doing flexibility !]  Stress Interventions Intervention Not Indicated  Social Connections Interventions Intervention Not Indicated        Care Coordination Interventions:  Yes, provided   Follow up plan: Follow up call scheduled for 1 week    Encounter Outcome:  Pt. Visit Completed   Kayleen Memos C. Myrtie Neither, MSN, [facility] Gerontological Nurse Practitioner [facility] Care Management [phone]

## 2022-12-19 DIAGNOSIS — R109 Unspecified abdominal pain: Secondary | ICD-10-CM | POA: Diagnosis not present

## 2022-12-19 DIAGNOSIS — R5383 Other fatigue: Secondary | ICD-10-CM | POA: Diagnosis not present

## 2022-12-19 DIAGNOSIS — R001 Bradycardia, unspecified: Secondary | ICD-10-CM | POA: Diagnosis not present

## 2022-12-19 DIAGNOSIS — Z6822 Body mass index (BMI) 22.0-22.9, adult: Secondary | ICD-10-CM | POA: Diagnosis not present

## 2022-12-19 DIAGNOSIS — I482 Chronic atrial fibrillation, unspecified: Secondary | ICD-10-CM | POA: Diagnosis not present

## 2022-12-19 DIAGNOSIS — I5030 Unspecified diastolic (congestive) heart failure: Secondary | ICD-10-CM | POA: Diagnosis not present

## 2022-12-20 ENCOUNTER — Telehealth (INDEPENDENT_AMBULATORY_CARE_PROVIDER_SITE_OTHER): Payer: Self-pay | Admitting: *Deleted

## 2022-12-20 ENCOUNTER — Ambulatory Visit (INDEPENDENT_AMBULATORY_CARE_PROVIDER_SITE_OTHER): Payer: PPO | Admitting: Internal Medicine

## 2022-12-20 ENCOUNTER — Encounter: Payer: Self-pay | Admitting: Internal Medicine

## 2022-12-20 ENCOUNTER — Encounter: Payer: Self-pay | Admitting: *Deleted

## 2022-12-20 VITALS — BP 154/84 | HR 82 | Temp 98.0°F | Ht 66.0 in | Wt 142.0 lb

## 2022-12-20 DIAGNOSIS — R1032 Left lower quadrant pain: Secondary | ICD-10-CM | POA: Diagnosis not present

## 2022-12-20 DIAGNOSIS — K5903 Drug induced constipation: Secondary | ICD-10-CM

## 2022-12-20 DIAGNOSIS — T402X5A Adverse effect of other opioids, initial encounter: Secondary | ICD-10-CM

## 2022-12-20 NOTE — Progress Notes (Deleted)
Referring Provider: Manon Hilding, MD Primary Care Physician:  Manon Hilding, MD Primary GI:  Dr. Abbey Chatters  Chief Complaint  Patient presents with   Abdominal Pain    Has abdominal pain on left lower side that goes around to back. Has constipation. Just started motegrity a couple weeks ago. Has 2 -3 stools per day but can also go 3 days without stool. Reports a little bloating but not a lot.     HPI:   Tanya Mueller is a 88 y.o. female who presents to the clinic for follow up visit.   Has trialed and failed Movantik, Linzess,   Past Medical History:  Diagnosis Date   Allergic rhinitis    Arthritis    Back ,hips   Cancer ([place])    Complication of anesthesia    awareness during general anesthesia.   Dysrhythmia    A-Fib and PSVT   History of atrial fibrillation    episode 2013 converted with cardizem-- happened at time acute illness due to kidney stone --  resolved  no issues since   History of kidney stones    History of sepsis    urosepsis  2013 due to kidney stone   Hypertension    Left nephrolithiasis    Left ureteral calculus    Neuromuscular disorder (HCC)    PAC (premature atrial contraction)    Solitary lung nodule    benign right middle lobe per CT    Past Surgical History:  Procedure Laterality Date   BACK SURGERY  2015   rod and screws   CATARACT EXTRACTION W/ INTRAOCULAR LENS  IMPLANT, BILATERAL  10/02/2008   CYSTOSCOPY WITH BIOPSY N/A 04/08/2019   Procedure: CYSTOSCOPY WITH bladder BIOPSY, FULGURATION;  Surgeon: Festus Aloe, MD;  Location: [facility];  Service: Urology;  Laterality: N/A;   CYSTOSCOPY WITH STENT PLACEMENT Left 09/15/2014   Procedure: CYSTOSCOPY WITH STENT PLACEMENT;  Surgeon: Georgette Dover, MD;  [facility];  Service: Urology;  Laterality: Left;   CYSTOSCOPY/RETROGRADE/URETEROSCOPY Left 08/09/2014   Procedure: CYSTOSCOPY/RETROGRADE/LEFT STENT;  Surgeon: Bernestine Amass, MD;  Location: [facility];  Service:  Urology;  Laterality: Left;   CYSTOSCOPY/URETEROSCOPY/HOLMIUM LASER/STENT PLACEMENT Left 09/15/2022   Procedure: CYSTOSCOPY LEFT URETEROSCOPY/STONE BASKET EXTRACTION;  Surgeon: Irine Seal, MD;  Location: [facility];  Service: Urology;  Laterality: Left;  1 HR FOR CASE   EXTRACORPOREAL SHOCK WAVE LITHOTRIPSY  10/03/2011   HOLMIUM LASER APPLICATION Left [id number]   Procedure: HOLMIUM LASER APPLICATION;  Surgeon: Georgette Dover, MD;  Location: [facility];  Service: Urology;  Laterality: Left;   IR URETERAL STENT LEFT NEW ACCESS W/O SEP NEPHROSTOMY CATH  08/18/2022   LAPAROTOMY W/  UNILATERAL SALPINGOOPHORECTOMY  age 30   NEPHROLITHOTOMY Left 08/18/2022   Procedure: LEFT NEPHROLITHOTOMY PERCUTANEOUS;  Surgeon: Irine Seal, MD;  Location: [facility];  Service: Urology;  Laterality: Left;  2.5 HRS FOR CASE   TOTAL ABDOMINAL HYSTERECTOMY  age 49   w/ unilateral salpingoophorectomy   TRANSTHORACIC ECHOCARDIOGRAM  02/13/2012   mild LVH/  ef 65%/  trivial MR  &  PR/  mild RVE   URETEROSCOPY Left 09/15/2014   Procedure: URETEROSCOPY;  Surgeon: Georgette Dover, MD;  Location: [facility];  Service: Urology;  Laterality: Left;    Current Outpatient Medications  Medication Sig Dispense Refill   acetaminophen (TYLENOL) 325 MG tablet Take 2 tablets (650 mg total) by mouth every 6 (six) hours as needed for mild  pain (or Fever >/= 101). 30 tablet 0   amiodarone (PACERONE) 200 MG tablet Take 200 mg by mouth daily.     apixaban (ELIQUIS) 5 MG TABS tablet Take 1 tablet (5 mg total) by mouth 2 (two) times daily. 60 tablet 5   fluticasone (FLONASE) 50 MCG/ACT nasal spray Place 2 sprays into the nose daily as needed for allergies.      furosemide (LASIX) 20 MG tablet Take 2 tablets (40 mg total) by mouth daily. 30 tablet 3   losartan (COZAAR) 50 MG tablet Take 1 tablet (50 mg total) by mouth daily. 90 tablet 3   Oxycodone HCl 10 MG TABS Take 10 mg by mouth 4 (four) times daily as  needed.     polyethylene glycol (MIRALAX / GLYCOLAX) 17 g packet Take 17 g by mouth daily as needed for moderate constipation.     potassium chloride (KLOR-CON M) 10 MEQ tablet Take 1 tablet (10 mEq total) by mouth daily. 90 tablet 1   Prucalopride Succinate (MOTEGRITY) 2 MG TABS Take 1 tablet (2 mg total) by mouth daily. 30 tablet 3   sertraline (ZOLOFT) 50 MG tablet Take 50 mg by mouth daily.     oxyCODONE-acetaminophen (PERCOCET) 10-325 MG tablet Take 1 tablet by mouth 4 (four) times daily as needed for pain. (Patient not taking: Reported on 12/13/2022)     No current facility-administered medications for this visit.    Allergies as of 12/20/2022 - Review Complete 12/20/2022  Allergen Reaction Noted   Morphine      Family History  Problem Relation Age of Onset   Cancer Other        Lung and bladder   Cancer Other        "Bone marrow cancer"    Social History   Socioeconomic History   Marital status: Married    Spouse name: Not on file   Number of children: 1   Years of education: Not on file   Highest education level: Not on file  Occupational History    Employer: RETIRED  Tobacco Use   Smoking status: Never    Passive exposure: Never   Smokeless tobacco: Never  Vaping Use   Vaping Use: Never used  Substance and Sexual Activity   Alcohol use: No   Drug use: No   Sexual activity: Not Currently  Other Topics Concern   Not on file  Social History Narrative   Not on file   Social Determinants of Health   Financial Resource Strain: Low Risk  (12/14/2022)   Overall Financial Resource Strain (CARDIA)    Difficulty of Paying Living Expenses: Not hard at all  Food Insecurity: No Food Insecurity (12/14/2022)   Hunger Vital Sign    Worried About Running Out of Food in the Last Year: Never true    Ran Out of Food in the Last Year: Never true  Transportation Needs: No Transportation Needs (12/14/2022)   PRAPARE - Hydrologist (Medical): No     Lack of Transportation (Non-Medical): No  Physical Activity: Inactive (12/14/2022)   Exercise Vital Sign    Days of Exercise per Week: 0 days    Minutes of Exercise per Session: 0 min  Stress: No Stress Concern Present (12/14/2022)   [place]    Feeling of Stress : Not at all  Social Connections: [place] (12/14/2022)   Social Connection and Isolation Panel [NHANES]    Frequency  of Communication with Friends and Family: More than three times a week    Frequency of Social Gatherings with Friends and Family: More than three times a week    Attends Religious Services: 1 to 4 times per year    Active Member of [employer] or Organizations: Yes    Attends Archivist Meetings: 1 to 4 times per year    Marital Status: Married    Subjective: ROS   Objective: BP (!) 154/84 (BP Location: Right Arm, Patient Position: Sitting, Cuff Size: Normal)   Pulse 82   Temp 98 F (36.7 C) (Oral)   Ht 5\' 6"  (1.676 m)   Wt 142 lb (64.4 kg)   BMI 22.92 kg/m  Physical Exam   Assessment: *  Plan:   12/20/2022 12:03 PM   Disclaimer: This note was dictated with voice recognition software. Similar sounding words can inadvertently be transcribed and may not be corrected upon review.

## 2022-12-20 NOTE — Progress Notes (Signed)
Referring Provider: Manon Hilding, MD Primary Care Physician:  Manon Hilding, MD Primary GI:  Dr. Abbey Chatters  Chief Complaint  Patient presents with   Abdominal Pain    Has abdominal pain on left lower side that goes around to back. Has constipation. Just started motegrity a couple weeks ago. Has 2 -3 stools per day but can also go 3 days without stool. Reports a little bloating but not a lot.     HPI:   Tanya Mueller is a 88 y.o. female who presents to clinic today for follow-up visit.  Has a history of opioid-induced constipation.  Has trialed and failed Movantik, Linzess, Amitiza.  Recently switched to [facility] and states this is helping for the most part.  Averaging 2-3 stools per day occasionally will go 2 to 3 days without a bowel movement however.  She is concerned about new worsening left lower quadrant pain.  Started approximately 1 to 2 weeks ago.  Intermittent, sometimes severe 9 out of 10 pain that we will double her over.  No melena hematochezia.  Pain radiates to her left back.  Does have a history of nephrolithiasis.    Past Medical History:  Diagnosis Date   Allergic rhinitis    Arthritis    Back ,hips   Cancer ([place])    Complication of anesthesia    awareness during general anesthesia.   Dysrhythmia    A-Fib and PSVT   History of atrial fibrillation    episode 2013 converted with cardizem-- happened at time acute illness due to kidney stone --  resolved  no issues since   History of kidney stones    History of sepsis    urosepsis  2013 due to kidney stone   Hypertension    Left nephrolithiasis    Left ureteral calculus    Neuromuscular disorder (HCC)    PAC (premature atrial contraction)    Solitary lung nodule    benign right middle lobe per CT    Past Surgical History:  Procedure Laterality Date   BACK SURGERY  2015   rod and screws   CATARACT EXTRACTION W/ INTRAOCULAR LENS  IMPLANT, BILATERAL  10/02/2008   CYSTOSCOPY WITH BIOPSY N/A 04/08/2019    Procedure: CYSTOSCOPY WITH bladder BIOPSY, FULGURATION;  Surgeon: Festus Aloe, MD;  Location: [facility];  Service: Urology;  Laterality: N/A;   CYSTOSCOPY WITH STENT PLACEMENT Left 09/15/2014   Procedure: CYSTOSCOPY WITH STENT PLACEMENT;  Surgeon: Georgette Dover, MD;  Location: [facility];  Service: Urology;  Laterality: Left;   CYSTOSCOPY/RETROGRADE/URETEROSCOPY Left 08/09/2014   Procedure: CYSTOSCOPY/RETROGRADE/LEFT STENT;  Surgeon: Bernestine Amass, MD;  Location: [facility];  Service: Urology;  Laterality: Left;   CYSTOSCOPY/URETEROSCOPY/HOLMIUM LASER/STENT PLACEMENT Left 09/15/2022   Procedure: CYSTOSCOPY LEFT URETEROSCOPY/STONE BASKET EXTRACTION;  Surgeon: Irine Seal, MD;  Location[facility];  Service: Urology;  Laterality: Left;  1 HR FOR CASE   EXTRACORPOREAL SHOCK WAVE LITHOTRIPSY  10/03/2011   HOLMIUM LASER APPLICATION Left [id number]   Procedure: HOLMIUM LASER APPLICATION;  Surgeon: Georgette Dover, MD;  Location: [facility];  Service: Urology;  Laterality: Left;   IR URETERAL STENT LEFT NEW ACCESS W/O SEP NEPHROSTOMY CATH  08/18/2022   LAPAROTOMY W/  UNILATERAL SALPINGOOPHORECTOMY  age 41   NEPHROLITHOTOMY Left 08/18/2022   Procedure: LEFT NEPHROLITHOTOMY PERCUTANEOUS;  Surgeon: Irine Seal, MD;  Location: [facility];  Service: Urology;  Laterality: Left;  2.5 HRS FOR CASE   TOTAL ABDOMINAL HYSTERECTOMY  age 88   w/ unilateral salpingoophorectomy   TRANSTHORACIC ECHOCARDIOGRAM  02/13/2012   mild LVH/  ef 65%/  trivial MR  &  PR/  mild RVE   URETEROSCOPY Left 09/15/2014   Procedure: URETEROSCOPY;  Surgeon: Georgette Dover, MD;  Location: [facility];  Service: Urology;  Laterality: Left;    Current Outpatient Medications  Medication Sig Dispense Refill   acetaminophen (TYLENOL) 325 MG tablet Take 2 tablets (650 mg total) by mouth every 6 (six) hours as needed for mild pain (or Fever >/= 101). 30 tablet 0   amiodarone (PACERONE) 200  MG tablet Take 200 mg by mouth daily.     apixaban (ELIQUIS) 5 MG TABS tablet Take 1 tablet (5 mg total) by mouth 2 (two) times daily. 60 tablet 5   fluticasone (FLONASE) 50 MCG/ACT nasal spray Place 2 sprays into the nose daily as needed for allergies.      furosemide (LASIX) 20 MG tablet Take 2 tablets (40 mg total) by mouth daily. 30 tablet 3   losartan (COZAAR) 50 MG tablet Take 1 tablet (50 mg total) by mouth daily. 90 tablet 3   Oxycodone HCl 10 MG TABS Take 10 mg by mouth 4 (four) times daily as needed.     polyethylene glycol (MIRALAX / GLYCOLAX) 17 g packet Take 17 g by mouth daily as needed for moderate constipation.     potassium chloride (KLOR-CON M) 10 MEQ tablet Take 1 tablet (10 mEq total) by mouth daily. 90 tablet 1   Prucalopride Succinate (MOTEGRITY) 2 MG TABS Take 1 tablet (2 mg total) by mouth daily. 30 tablet 3   sertraline (ZOLOFT) 50 MG tablet Take 50 mg by mouth daily.     oxyCODONE-acetaminophen (PERCOCET) 10-325 MG tablet Take 1 tablet by mouth 4 (four) times daily as needed for pain. (Patient not taking: Reported on 12/13/2022)     No current facility-administered medications for this visit.    Allergies as of 12/20/2022 - Review Complete 12/20/2022  Allergen Reaction Noted   Morphine      Family History  Problem Relation Age of Onset   Cancer Other        Lung and bladder   Cancer Other        "Bone marrow cancer"    Social History   Socioeconomic History   Marital status: Married    Spouse name: Not on file   Number of children: 1   Years of education: Not on file   Highest education level: Not on file  Occupational History    Employer: RETIRED  Tobacco Use   Smoking status: Never    Passive exposure: Never   Smokeless tobacco: Never  Vaping Use   Vaping Use: Never used  Substance and Sexual Activity   Alcohol use: No   Drug use: No   Sexual activity: Not Currently  Other Topics Concern   Not on file  Social History Narrative   Not on  file   Social Determinants of Health   Financial Resource Strain: Low Risk  (12/14/2022)   Overall Financial Resource Strain (CARDIA)    Difficulty of Paying Living Expenses: Not hard at all  Food Insecurity: No Food Insecurity (12/14/2022)   Hunger Vital Sign    Worried About Running Out of Food in the Last Year: Never true    Ran Out of Food in the Last Year: Never true  Transportation Needs: No Transportation Needs (12/14/2022)   PRAPARE - Transportation  Lack of Transportation (Medical): No    Lack of Transportation (Non-Medical): No  Physical Activity: Inactive (12/14/2022)   Exercise Vital Sign    Days of Exercise per Week: 0 days    Minutes of Exercise per Session: 0 min  Stress: No Stress Concern Present (12/14/2022)   [place]    Feeling of Stress : Not at all  Social Connections: [place] (12/14/2022)   Social Connection and Isolation Panel [NHANES]    Frequency of Communication with Friends and Family: More than three times a week    Frequency of Social Gatherings with Friends and Family: More than three times a week    Attends Religious Services: 1 to 4 times per year    Active Member of [employer] or Organizations: Yes    Attends Archivist Meetings: 1 to 4 times per year    Marital Status: Married    Subjective: Review of Systems  Constitutional:  Negative for chills and fever.  HENT:  Negative for congestion and hearing loss.   Eyes:  Negative for blurred vision and double vision.  Respiratory:  Negative for cough and shortness of breath.   Cardiovascular:  Negative for chest pain and palpitations.  Gastrointestinal:  Positive for abdominal pain and constipation. Negative for blood in stool, diarrhea, heartburn, melena and vomiting.  Genitourinary:  Negative for dysuria and urgency.  Musculoskeletal:  Negative for joint pain and myalgias.  Skin:  Negative for itching and rash.   Neurological:  Negative for dizziness and headaches.  Psychiatric/Behavioral:  Negative for depression. The patient is not nervous/anxious.      Objective: BP (!) 154/84 (BP Location: Right Arm, Patient Position: Sitting, Cuff Size: Normal)   Pulse 82   Temp 98 F (36.7 C) (Oral)   Ht 5\' 6"  (1.676 m)   Wt 142 lb (64.4 kg)   BMI 22.92 kg/m  Physical Exam Constitutional:      Appearance: Normal appearance.  HENT:     Head: Normocephalic and atraumatic.  Eyes:     Extraocular Movements: Extraocular movements intact.     Conjunctiva/sclera: Conjunctivae normal.  Cardiovascular:     Rate and Rhythm: Normal rate and regular rhythm.  Pulmonary:     Effort: Pulmonary effort is normal.     Breath sounds: Normal breath sounds.  Abdominal:     General: Bowel sounds are normal.     Palpations: Abdomen is soft.     Tenderness: There is abdominal tenderness.  Musculoskeletal:        General: No swelling. Normal range of motion.     Cervical back: Normal range of motion and neck supple.  Skin:    General: Skin is warm and dry.     Coloration: Skin is not jaundiced.  Neurological:     General: No focal deficit present.     Mental Status: She is alert and oriented to person, place, and time.  Psychiatric:        Mood and Affect: Mood normal.        Behavior: Behavior normal.      Assessment: *Opioid-induced constipation *Left lower quadrant pain-new, worsening  Plan: Constipation improved on Motegrity, will continue.  Can add on MiraLAX as needed if need be.  Has trialed and failed Movantik, Linzess, Amitiza.  New worsening left lower quadrant pain concerning.  Tender on exam today.  Will order CT abdomen pelvis with IV contrast to further evaluate.  Call with results.  Otherwise follow-up in 3 to 4 months.  12/20/2022 12:01 PM   Disclaimer: This note was dictated with voice recognition software. Similar sounding words can inadvertently be transcribed and may not be  corrected upon review.

## 2022-12-20 NOTE — Addendum Note (Signed)
Addended by: Cheron Every on: 12/20/2022 01:43 PM   Modules accepted: Orders

## 2022-12-20 NOTE — Patient Instructions (Signed)
I am going to order a CT scan of your abdomen pelvis to further evaluate your abdominal pain.  Continue on Motegrity for your chronic constipation.  You can add MiraLAX on top of this as needed.  We will call with CT results.  Otherwise follow-up in 3 to 4 months.  It was very nice seeing both of you again today.  Dr. Abbey Chatters

## 2022-12-20 NOTE — Telephone Encounter (Signed)
Called pt and spoke with daughter Rhinda. Made her aware of CT appt 3/26, arrival 830am, npo 4 hrs prior. She voiced understanding and had no questions.

## 2022-12-21 ENCOUNTER — Ambulatory Visit: Payer: PPO | Admitting: Urology

## 2022-12-22 ENCOUNTER — Ambulatory Visit: Payer: PPO | Admitting: Internal Medicine

## 2022-12-25 ENCOUNTER — Ambulatory Visit: Payer: PPO | Admitting: Internal Medicine

## 2022-12-26 ENCOUNTER — Ambulatory Visit (HOSPITAL_COMMUNITY)
Admission: RE | Admit: 2022-12-26 | Discharge: 2022-12-26 | Disposition: A | Discharge status: Home / Self Care | Payer: PPO | Source: Ambulatory Visit | Attending: Internal Medicine | Admitting: Internal Medicine

## 2022-12-26 DIAGNOSIS — R1032 Left lower quadrant pain: Secondary | ICD-10-CM | POA: Diagnosis not present

## 2022-12-26 DIAGNOSIS — I700 Atherosclerosis of aorta: Secondary | ICD-10-CM | POA: Diagnosis not present

## 2022-12-26 MED ORDER — IOHEXOL 9 MG/ML PO SOLN
ORAL | Status: AC
  Filled 2022-12-26: qty 1000

## 2022-12-26 MED ORDER — IOHEXOL 300 MG/ML  SOLN
75.0000 mL | Freq: Once | INTRAMUSCULAR | Status: AC | PRN
  Administered 2022-12-26: 75 mL via INTRAVENOUS

## 2022-12-28 ENCOUNTER — Ambulatory Visit: Payer: PPO | Admitting: Nurse Practitioner

## 2022-12-28 ENCOUNTER — Ambulatory Visit: Payer: PPO | Admitting: Urology

## 2022-12-31 DIAGNOSIS — E782 Mixed hyperlipidemia: Secondary | ICD-10-CM | POA: Diagnosis not present

## 2022-12-31 DIAGNOSIS — I10 Essential (primary) hypertension: Secondary | ICD-10-CM | POA: Diagnosis not present

## 2022-12-31 DIAGNOSIS — I4891 Unspecified atrial fibrillation: Secondary | ICD-10-CM | POA: Diagnosis not present

## 2023-01-02 ENCOUNTER — Ambulatory Visit: Payer: PPO | Attending: Nurse Practitioner | Admitting: Nurse Practitioner

## 2023-01-02 ENCOUNTER — Encounter: Payer: Self-pay | Admitting: Nurse Practitioner

## 2023-01-02 VITALS — BP 120/62 | HR 61 | Ht 66.0 in | Wt 140.4 lb

## 2023-01-02 DIAGNOSIS — I5032 Chronic diastolic (congestive) heart failure: Secondary | ICD-10-CM

## 2023-01-02 DIAGNOSIS — I10 Essential (primary) hypertension: Secondary | ICD-10-CM

## 2023-01-02 DIAGNOSIS — E876 Hypokalemia: Secondary | ICD-10-CM

## 2023-01-02 DIAGNOSIS — I4892 Unspecified atrial flutter: Secondary | ICD-10-CM

## 2023-01-02 DIAGNOSIS — I480 Paroxysmal atrial fibrillation: Secondary | ICD-10-CM | POA: Diagnosis not present

## 2023-01-02 NOTE — Progress Notes (Unsigned)
Cardiology Office Note:    Date:  01/02/2023  ID:  Tanya Mueller, DOB Apr 28, 1935, MRN [id number]  PCP:  Manon Hilding, MD   [place] [facility] Cardiologist:  Chalmers Guest, MD     Referring MD: Manon Hilding, MD   CC: Jitteriness/ A-fib follow-up  History of Present Illness:    Tanya Mueller is a delightful 88 y.o. female with a hx of the following:  A-fib/ A-flutter, PSVT PACs Hypertension   Previously followed by Dr. Bronson Ing for palpitations. Monitor in 2018 showed symptomatic PACs.  Echocardiogram was normal.  Diagnosed with A-fib in December 2023, started on metoprolol tartrate 50 mg twice daily.  Underwent cystoscopy in December 2023 and upon discharge from hospital went into A-fib with RVR, on diltiazem drip, and metoprolol dose was increased to 75 mg twice daily.  Coumadin was switched to Eliquis.  Seen by Dr. Dellia Cloud on 10/04/2022.  Patient continued to note palpitations at rest and with moving around.  Heart rate monitored on smart watch revealed heart rate averaging 70-100's BPM.  Denied any other associated symptoms.  Amiodarone was started for adequate heart rate controls.  Was told to take amiodarone 200 mg twice daily for 3 weeks, followed by amiodarone 200 mg daily thereafter.   Our office was contacted in January 2024. Daughter stated patient had been feeling nervous, jittery, and unable to sleep and thought that amiodarone may be contributing to her symptoms.  Dr. Dellia Cloud stated it was less likely to be due to amiodarone, but suggested to decrease amiodarone dosage to 100 mg to see if symptoms resolved.  At last visit, she stated her heart rate was not racing after starting amiodarone; however she noted jitteriness. Saw her PCP who started her on Zoloft. Had significant stress. Reported 3-4 hours of sleep per night. Saw Dr. Dellia Cloud 12/06/2022, DCCV/stress testing was recommended d/t DOE, however pt declined at the time. Lasix 20 mg PRN was  started.  Admitted March 2024 for acute hypoxic respiratory failure secondary to acute diastolic CHF exacerbation. Found to be in A-fib and bradycardic, prolonged Qtc. Repleted K+. Treated with IV Lasix, Qtc normalized.   Today she presents for follow-up. Breathing has improved and jitteriness has resolved. Denies any chest pain, shortness of breath, palpitations, syncope, presyncope, dizziness, orthopnea, PND, swelling or significant weight changes, acute bleeding, or claudication.  Past Medical History:  Diagnosis Date   Allergic rhinitis    Arthritis    Back ,hips   Cancer    Complication of anesthesia    awareness during general anesthesia.   Dysrhythmia    A-Fib and PSVT   History of atrial fibrillation    episode 2013 converted with cardizem-- happened at time acute illness due to kidney stone --  resolved  no issues since   History of kidney stones    History of sepsis    urosepsis  2013 due to kidney stone   Hypertension    Left nephrolithiasis    Left ureteral calculus    Neuromuscular disorder    PAC (premature atrial contraction)    Solitary lung nodule    benign right middle lobe per CT    Past Surgical History:  Procedure Laterality Date   BACK SURGERY  2015   rod and screws   CATARACT EXTRACTION W/ INTRAOCULAR LENS  IMPLANT, BILATERAL  10/02/2008   CYSTOSCOPY WITH BIOPSY N/A 04/08/2019   Procedure: CYSTOSCOPY WITH bladder BIOPSY, FULGURATION;  Surgeon: Festus Aloe, MD;  Location: [facility];  Service: Urology;  Laterality: N/A;   CYSTOSCOPY WITH STENT PLACEMENT Left 09/15/2014   Procedure: CYSTOSCOPY WITH STENT PLACEMENT;  Surgeon: Georgette Dover, MD;  Location[facility];  Service: Urology;  Laterality: Left;   CYSTOSCOPY/RETROGRADE/URETEROSCOPY Left 08/09/2014   Procedure: CYSTOSCOPY/RETROGRADE/LEFT STENT;  Surgeon: Bernestine Amass, MD;  Location: [facility];  Service: Urology;  Laterality: Left;   CYSTOSCOPY/URETEROSCOPY/HOLMIUM LASER/STENT  PLACEMENT Left 09/15/2022   Procedure: CYSTOSCOPY LEFT URETEROSCOPY/STONE BASKET EXTRACTION;  Surgeon: Irine Seal, MD;  Location: [facility];  Service: Urology;  Laterality: Left;  1 HR FOR CASE   EXTRACORPOREAL SHOCK WAVE LITHOTRIPSY  10/03/2011   HOLMIUM LASER APPLICATION Left [id number]   Procedure: HOLMIUM LASER APPLICATION;  Surgeon: Georgette Dover, MD;  Location: [facility];  Service: Urology;  Laterality: Left;   IR URETERAL STENT LEFT NEW ACCESS W/O SEP NEPHROSTOMY CATH  08/18/2022   LAPAROTOMY W/  UNILATERAL SALPINGOOPHORECTOMY  age 45   NEPHROLITHOTOMY Left 08/18/2022   Procedure: LEFT NEPHROLITHOTOMY PERCUTANEOUS;  Surgeon: Irine Seal, MD;  Location: [facility];  Service: Urology;  Laterality: Left;  2.5 HRS FOR CASE   TOTAL ABDOMINAL HYSTERECTOMY  age 81   w/ unilateral salpingoophorectomy   TRANSTHORACIC ECHOCARDIOGRAM  02/13/2012   mild LVH/  ef 65%/  trivial MR  &  PR/  mild RVE   URETEROSCOPY Left 09/15/2014   Procedure: URETEROSCOPY;  Surgeon: Georgette Dover, MD;  Location: [facility];  Service: Urology;  Laterality: Left;    Current Medications: Current Meds  Medication Sig   acetaminophen (TYLENOL) 325 MG tablet Take 2 tablets (650 mg total) by mouth every 6 (six) hours as needed for mild pain (or Fever >/= 101).   amiodarone (PACERONE) 200 MG tablet Take 200 mg by mouth daily.   apixaban (ELIQUIS) 5 MG TABS tablet Take 1 tablet (5 mg total) by mouth 2 (two) times daily.   fluticasone (FLONASE) 50 MCG/ACT nasal spray Place 2 sprays into the nose daily as needed for allergies.    furosemide (LASIX) 20 MG tablet Take 2 tablets (40 mg total) by mouth daily. (Patient taking differently: Take 20 mg by mouth daily.)   losartan (COZAAR) 50 MG tablet Take 1 tablet (50 mg total) by mouth daily.   Oxycodone HCl 10 MG TABS Take 10 mg by mouth 4 (four) times daily as needed.   polyethylene glycol (MIRALAX / GLYCOLAX) 17 g packet Take 17 g by mouth  daily as needed for moderate constipation.   potassium chloride (KLOR-CON M) 10 MEQ tablet Take 1 tablet (10 mEq total) by mouth daily.   Prucalopride Succinate (MOTEGRITY) 2 MG TABS Take 1 tablet (2 mg total) by mouth daily.   sertraline (ZOLOFT) 50 MG tablet Take 50 mg by mouth daily.    Allergies:   Morphine   Social History   Socioeconomic History   Marital status: Married    Spouse name: Not on file   Number of children: 1   Years of education: Not on file   Highest education level: Not on file  Occupational History    Employer: RETIRED  Tobacco Use   Smoking status: Never    Passive exposure: Never   Smokeless tobacco: Never  Vaping Use   Vaping Use: Never used  Substance and Sexual Activity   Alcohol use: No   Drug use: No   Sexual activity: Not Currently  Other Topics Concern   Not on file  Social History Narrative   Not  on file   Social Determinants of Health   Financial Resource Strain: Low Risk  (12/14/2022)   Overall Financial Resource Strain (CARDIA)    Difficulty of Paying Living Expenses: Not hard at all  Food Insecurity: No Food Insecurity (12/14/2022)   Hunger Vital Sign    Worried About Running Out of Food in the Last Year: Never true    Ran Out of Food in the Last Year: Never true  Transportation Needs: No Transportation Needs (12/14/2022)   PRAPARE - Hydrologist (Medical): No    Lack of Transportation (Non-Medical): No  Physical Activity: Inactive (12/14/2022)   Exercise Vital Sign    Days of Exercise per Week: 0 days    Minutes of Exercise per Session: 0 min  Stress: No Stress Concern Present (12/14/2022)   [place]    Feeling of Stress : Not at all  Social Connections: [place] (12/14/2022)   Social Connection and Isolation Panel [NHANES]    Frequency of Communication with Friends and Family: More than three times a week    Frequency of  Social Gatherings with Friends and Family: More than three times a week    Attends Religious Services: 1 to 4 times per year    Active Member of [employer] or Organizations: Yes    Attends Archivist Meetings: 1 to 4 times per year    Marital Status: Married     Family History: The patient's family history includes Cancer in some other family members.  ROS:     Please see the history of present illness.    All other systems reviewed and are negative.  EKGs/Labs/Other Studies Reviewed:    The following studies were reviewed today:   EKG:  EKG is ot ordered today.     Echocardiogram on 09/16/2022: 1. Left ventricular ejection fraction, by estimation, is 55 to 60%. The  left ventricle has normal function. The left ventricle has no regional  wall motion abnormalities. Left ventricular diastolic parameters are  indeterminate.   2. Right ventricular systolic function is normal. The right ventricular  size is moderately enlarged.   3. Left atrial size was mildly dilated.   4. The mitral valve is normal in structure. Mild mitral valve  regurgitation. No evidence of mitral stenosis.   5. The aortic valve was not well visualized. Aortic valve regurgitation  is not visualized. No aortic stenosis is present.   6. The inferior vena cava is normal in size with <50% respiratory  variability, suggesting right atrial pressure of 8 mmHg.   Comparison(s): Similar to 2018 Report.  Holter monitor on 05/21/2017: Sinus rhythm with frequent PAC's and isolated PVC's. Brief atrial runs noted. Symptoms correlated with PAC's.  Recent Labs: 09/15/2022: TSH 1.165 12/10/2022: ALT 12; B Natriuretic Peptide 427.0; Hemoglobin 14.4; Platelets 288 12/12/2022: BUN 9; Creatinine, Ser 1.02; Magnesium 1.8; Potassium 3.1; Sodium 137  Recent Lipid Panel No results found for: "CHOL", "TRIG", "HDL", "CHOLHDL", "VLDL", "LDLCALC", "LDLDIRECT"   Risk Assessment/Calculations:    CHA2DS2-VASc Score = 4   This indicates a 4.8% annual risk of stroke. The patient's score is based upon: CHF History: 0 HTN History: 1 Diabetes History: 0 Stroke History: 0 Vascular Disease History: 0 Age Score: 2 Gender Score: 1    Physical Exam:    VS:  BP 120/62 (BP Location: Left Arm, Patient Position: Sitting, Cuff Size: Normal)   Pulse 61   Ht 5\' 6"  (1.676  m)   Wt 140 lb 6.4 oz (63.7 kg)   SpO2 93%   BMI 22.66 kg/m     Wt Readings from Last 3 Encounters:  01/02/23 140 lb 6.4 oz (63.7 kg)  12/20/22 142 lb (64.4 kg)  12/13/22 140 lb (63.5 kg)     GEN: Well nourished, well developed 88 y.o. female in no acute distress HEENT: Normal NECK: No JVD; No carotid bruits CARDIAC: S1/S2, RRR, no murmurs, rubs, gallops; 2+ pulses RESPIRATORY:  Clear to auscultation without rales, wheezing or rhonchi  MUSCULOSKELETAL:  No edema; No deformity  SKIN: Thin skin, warm and dry NEUROLOGIC:  Alert and oriented x 3; minimal essential tremors on exam PSYCHIATRIC:  Normal affect   ASSESSMENT:    1. PAF (paroxysmal atrial fibrillation)   2. Atrial flutter, unspecified type   3. Chronic diastolic CHF (congestive heart failure)   4. Hypokalemia   5. Essential hypertension, benign    PLAN:    In order of problems listed above:  A-fib/A-flutter, long term use of anticoagulation, medication monitoring Denies any fast heart rates and symptoms improved with using amiodarone. HR normal today. Patient prefers to continue Amiodarone at current dose. Continue Amiodarone. No longer on Metoprolol d/t bradycardia. Continue Eliquis 5 mg BID, on appropriate dosage, denies any bleeding issues. If weight drops < 60 kg in future, will require reduced dosing to 2.5 mg BID.  Discussed to avoid A-fib triggers. Heart healthy diet recommended. Defers cardiac monitor at this time, will request Apple Watch readings. Will want to view these readings prior to discussing DCCV/consider stress testing - consider readdressing at next OV.  Recommended Kard[employer] mobile app.   2. Chronic diastolic CHF, hypokalemia Echo 09/2022 showed EF 55-60%. Euvolemic and well compensated on exam. Continue Lasix and current medication regimen. Low sodium diet, fluid restriction <2L, and daily weights encouraged. Educated to contact our office for weight gain of 2 lbs overnight or 5 lbs in one week. Last labs revealed mild hypokalemia, asymptomatic, will obtain BMET in 2 weeks per protocol.  3. HTN Blood pressure stable today.  BP well-controlled at home.  Continue losartan. Discussed to monitor BP at home at least 2 hours after medications and sitting for 5-10 minutes.  Heart healthy diet recommended.  4. Disposition: Follow-up in 2-3 months with Dr. Dellia Cloud or APP or sooner if anything changes.    Medication Adjustments/Labs and Tests Ordered: Current medicines are reviewed at length with the patient today.  Concerns regarding medicines are outlined above.  Orders Placed This Encounter  Procedures   Basic metabolic panel   No orders of the defined types were placed in this encounter.   Patient Instructions  Medication Instructions:  Your physician recommends that you continue on your current medications as directed. Please refer to the Current Medication list given to you today.   Labwork: BMET (due in 2 weeks @ [facility])  Testing/Procedures: none  Follow-Up:  Your physician recommends that you schedule a follow-up appointment in: 2-3 months  Any Other Special Instructions Will Be Listed Below (If Applicable).  Please send readings from [place] into [place]  If you need a refill on your cardiac medications before your next appointment, please call your pharmacy.    Signed, Finis Bud, NP  01/03/2023 10:10 PM    [facility]

## 2023-01-02 NOTE — Patient Instructions (Addendum)
Medication Instructions:  Your physician recommends that you continue on your current medications as directed. Please refer to the Current Medication list given to you today.   Labwork: BMET (due in 2 weeks @ [facility])  Testing/Procedures: none  Follow-Up:  Your physician recommends that you schedule a follow-up appointment in: 2-3 months  Any Other Special Instructions Will Be Listed Below (If Applicable).  Please send readings from [place] into [place]  If you need a refill on your cardiac medications before your next appointment, please call your pharmacy.

## 2023-01-09 ENCOUNTER — Ambulatory Visit: Payer: Self-pay | Admitting: *Deleted

## 2023-01-09 ENCOUNTER — Ambulatory Visit: Payer: PPO | Admitting: Internal Medicine

## 2023-01-09 NOTE — Patient Outreach (Signed)
  Care Coordination   Follow Up Visit Note   01/09/2023 Name: Tanya Mueller MRN: [id number] DOB: October 06, 1934  Tanya Mueller is a 88 y.o. year old female who sees Sasser, Clarene Critchley, MD for primary care. I spoke with  Tanya Mueller by phone today.  What matters to the patients health and wellness today?  CONTINUE TO FOLLOW HF ACTION PLAN AND STAY OUT OF THE HOSPITAL    Goals Addressed   None     SDOH assessments and interventions completed:  Yes{THN Tip this will not be part of the note when signed-REQUIRED REPORT FIELD DO NOT DELETE (Optional):27901}   PREVIOUSLY ASSESSED.  Care Coordination Interventions:  Yes, provided {THN Tip this will not be part of the note when signed-REQUIRED REPORT FIELD DO NOT DELETE (Optional):27901}  Follow up plan: {CCFOLLOWUP:27768}   Encounter Outcome:  Pt. Visit Completed {THN Tip this will not be part of the note when signed-REQUIRED REPORT FIELD DO NOT DELETE (Optional):27901}  Carlson Belland C. Burgess Estelle, MSN, [facility] Gerontological Nurse Practitioner [facility] [phone]

## 2023-01-11 DIAGNOSIS — R5383 Other fatigue: Secondary | ICD-10-CM | POA: Diagnosis not present

## 2023-01-11 DIAGNOSIS — I482 Chronic atrial fibrillation, unspecified: Secondary | ICD-10-CM | POA: Diagnosis not present

## 2023-01-11 DIAGNOSIS — I5030 Unspecified diastolic (congestive) heart failure: Secondary | ICD-10-CM | POA: Diagnosis not present

## 2023-01-11 DIAGNOSIS — R001 Bradycardia, unspecified: Secondary | ICD-10-CM | POA: Diagnosis not present

## 2023-01-12 DIAGNOSIS — E876 Hypokalemia: Secondary | ICD-10-CM | POA: Diagnosis not present

## 2023-01-22 DIAGNOSIS — Z6822 Body mass index (BMI) 22.0-22.9, adult: Secondary | ICD-10-CM | POA: Diagnosis not present

## 2023-01-22 DIAGNOSIS — R251 Tremor, unspecified: Secondary | ICD-10-CM | POA: Diagnosis not present

## 2023-01-22 DIAGNOSIS — M545 Low back pain, unspecified: Secondary | ICD-10-CM | POA: Diagnosis not present

## 2023-01-22 DIAGNOSIS — M419 Scoliosis, unspecified: Secondary | ICD-10-CM | POA: Diagnosis not present

## 2023-01-22 DIAGNOSIS — R5383 Other fatigue: Secondary | ICD-10-CM | POA: Diagnosis not present

## 2023-01-22 DIAGNOSIS — R030 Elevated blood-pressure reading, without diagnosis of hypertension: Secondary | ICD-10-CM | POA: Diagnosis not present

## 2023-01-23 DIAGNOSIS — N1831 Chronic kidney disease, stage 3a: Secondary | ICD-10-CM | POA: Diagnosis not present

## 2023-01-23 DIAGNOSIS — I10 Essential (primary) hypertension: Secondary | ICD-10-CM | POA: Diagnosis not present

## 2023-01-23 DIAGNOSIS — R5381 Other malaise: Secondary | ICD-10-CM | POA: Diagnosis not present

## 2023-01-23 DIAGNOSIS — Z1329 Encounter for screening for other suspected endocrine disorder: Secondary | ICD-10-CM | POA: Diagnosis not present

## 2023-01-23 DIAGNOSIS — E559 Vitamin D deficiency, unspecified: Secondary | ICD-10-CM | POA: Diagnosis not present

## 2023-01-26 DIAGNOSIS — I10 Essential (primary) hypertension: Secondary | ICD-10-CM | POA: Diagnosis not present

## 2023-01-26 DIAGNOSIS — Z6822 Body mass index (BMI) 22.0-22.9, adult: Secondary | ICD-10-CM | POA: Diagnosis not present

## 2023-02-01 ENCOUNTER — Telehealth: Payer: Self-pay | Admitting: Internal Medicine

## 2023-02-01 NOTE — Telephone Encounter (Signed)
Pt's daughter states that pt is trembling in hands and body for 1-2 months. Daughter states that they spoke with the [facility] who mentioned that it may be the Amiodarone causing this. Pt and daughter would like to know if this needs to be adjusted. Please advise.

## 2023-02-01 NOTE — Telephone Encounter (Signed)
Pt c/o medication issue:  1. Name of Medication: amiodarone (PACERONE) 200 MG tablet   2. How are you currently taking this medication (dosage and times per day)? Take 200 mg by mouth daily.   3. Are you having a reaction (difficulty breathing--STAT)? No  4. What is your medication issue? Patient's daughter is calling because the patient thinks this medication might be causing her hands to become very shaky. Patient's daughter stated that the patient feels shaky all over on the inside. Please advise.

## 2023-02-02 NOTE — Telephone Encounter (Signed)
VM not set up.

## 2023-02-02 NOTE — Telephone Encounter (Signed)
Daughter (Rinda) notified.

## 2023-02-06 DIAGNOSIS — D7589 Other specified diseases of blood and blood-forming organs: Secondary | ICD-10-CM | POA: Diagnosis not present

## 2023-02-06 DIAGNOSIS — N1831 Chronic kidney disease, stage 3a: Secondary | ICD-10-CM | POA: Diagnosis not present

## 2023-02-06 DIAGNOSIS — R5383 Other fatigue: Secondary | ICD-10-CM | POA: Diagnosis not present

## 2023-02-06 DIAGNOSIS — I10 Essential (primary) hypertension: Secondary | ICD-10-CM | POA: Diagnosis not present

## 2023-02-06 DIAGNOSIS — M48062 Spinal stenosis, lumbar region with neurogenic claudication: Secondary | ICD-10-CM | POA: Diagnosis not present

## 2023-02-06 DIAGNOSIS — Z23 Encounter for immunization: Secondary | ICD-10-CM | POA: Diagnosis not present

## 2023-02-06 DIAGNOSIS — E1129 Type 2 diabetes mellitus with other diabetic kidney complication: Secondary | ICD-10-CM | POA: Diagnosis not present

## 2023-02-06 DIAGNOSIS — R609 Edema, unspecified: Secondary | ICD-10-CM | POA: Diagnosis not present

## 2023-02-06 DIAGNOSIS — R002 Palpitations: Secondary | ICD-10-CM | POA: Diagnosis not present

## 2023-02-06 DIAGNOSIS — M545 Low back pain, unspecified: Secondary | ICD-10-CM | POA: Diagnosis not present

## 2023-02-06 DIAGNOSIS — M1990 Unspecified osteoarthritis, unspecified site: Secondary | ICD-10-CM | POA: Diagnosis not present

## 2023-02-06 DIAGNOSIS — R300 Dysuria: Secondary | ICD-10-CM | POA: Diagnosis not present

## 2023-02-06 DIAGNOSIS — M5432 Sciatica, left side: Secondary | ICD-10-CM | POA: Diagnosis not present

## 2023-02-06 DIAGNOSIS — M47816 Spondylosis without myelopathy or radiculopathy, lumbar region: Secondary | ICD-10-CM | POA: Diagnosis not present

## 2023-02-06 DIAGNOSIS — R4582 Worries: Secondary | ICD-10-CM | POA: Diagnosis not present

## 2023-03-01 ENCOUNTER — Encounter: Payer: Self-pay | Admitting: Urology

## 2023-03-01 ENCOUNTER — Ambulatory Visit (INDEPENDENT_AMBULATORY_CARE_PROVIDER_SITE_OTHER): Payer: PPO | Admitting: Urology

## 2023-03-01 VITALS — BP 147/70 | HR 80 | Ht 66.0 in | Wt 140.4 lb

## 2023-03-01 DIAGNOSIS — M545 Low back pain, unspecified: Secondary | ICD-10-CM | POA: Diagnosis not present

## 2023-03-01 DIAGNOSIS — R109 Unspecified abdominal pain: Secondary | ICD-10-CM

## 2023-03-01 DIAGNOSIS — N390 Urinary tract infection, site not specified: Secondary | ICD-10-CM

## 2023-03-01 DIAGNOSIS — R1012 Left upper quadrant pain: Secondary | ICD-10-CM | POA: Diagnosis not present

## 2023-03-01 DIAGNOSIS — N200 Calculus of kidney: Secondary | ICD-10-CM | POA: Diagnosis not present

## 2023-03-01 DIAGNOSIS — R3914 Feeling of incomplete bladder emptying: Secondary | ICD-10-CM

## 2023-03-01 DIAGNOSIS — R339 Retention of urine, unspecified: Secondary | ICD-10-CM

## 2023-03-01 DIAGNOSIS — N3021 Other chronic cystitis with hematuria: Secondary | ICD-10-CM | POA: Diagnosis not present

## 2023-03-01 DIAGNOSIS — R3129 Other microscopic hematuria: Secondary | ICD-10-CM | POA: Diagnosis not present

## 2023-03-01 LAB — URINALYSIS, ROUTINE W REFLEX MICROSCOPIC
Bilirubin, UA: NEGATIVE
Glucose, UA: NEGATIVE
Ketones, UA: NEGATIVE
Nitrite, UA: NEGATIVE
Specific Gravity, UA: 1.02 (ref 1.005–1.030)
Urobilinogen, Ur: 2 mg/dL — ABNORMAL HIGH (ref 0.2–1.0)
pH, UA: 5.5 (ref 5.0–7.5)

## 2023-03-01 LAB — MICROSCOPIC EXAMINATION: RBC, Urine: >30 /hpf — AB (ref 0–2)

## 2023-03-01 NOTE — Progress Notes (Signed)
Subjective: 1. Incomplete emptying of bladder   2. Renal stones   3. Frequent UTI   4. Microhematuria   5. Flank pain   6. Left upper quadrant abdominal pain      03/01/23: Tanya Mueller returns today in f/u for her history below. Her Cr was 1.02 in 3/24.  She had a CT on 12/26/22 for LLQ pain and constipation and was found to have small bilateral renal stones without obstruction. The bladder looked fine.  She has had some increased pain in the left flank and into the hip and across the back and into the leg.  She has nausea without vomiting.  She had hematuria a couple of weeks ago and that gradually clears.  Her UA today has >30 RBC's.   She has some frequency and urgency.  She has not strain to void at times.  She remains on Eliquis.   2/29/24[name] returns today in f/u.  She had a left PCNL and left URS late last year.  She had hemorrhagic cystitis on 2/5 and took cipro and then amoxicillin once the culture was back.   She had a renal US on 11/21/22 and that showed no hydro or stones.  Her PVR is 0ml today and her UA just has 3-10 RBC's.  She has persistent left flank pain.   She has some vaginal irritation.  She has some malaise as well.  She is on chronic oxycodone q6hrs for chronic back pain.    09/21/22: Tanya Mueller returns today in f/u.  She had a left PCNL and subsequent left URS and the stent has been removed.  She has some mild back pain.  She has no dysuria.  She had some increased nocturia last night.  Her UA has 11-30 RBC's. She had to come back to the hospital with a-fib on the evening of her last procedure on 09/15/22 and her HR remains variable.    Gu hx; Tanya Mueller is an 88 yo female who is a former patient of Dr. Mena Goes in [place].  She had follicular cystitis on a biopsy in 7/20 and has a history of stones with multiple prior procedures.  She has a history of OAB and has failed vesicare and myrbetriq.   She last had a CT in in 2/23 and had a 2.3cm laminated stone in the left renal pelvis and  smaller right renal stones.  There was a 12mm stone in the LLP on a CT in 2020.   She has a chronic UTI that are becoming more frequency and she will generally benefit from a course of antibiotics.  The last oral antibiotic didn't help and needed a injection. She was in the ER on 05/05/22 with constipation.  A culture that day was negative.  She currently complains of malodorous urine over the last 2 weeks with malaise.  She has no dysuria.   She has some frequency and urgency.  She has not had recent hematuria.   She has had multilevel back surgery with fusion and her back pain gets worse with the infections, left > right.  Her UA looks infected.    ROS:  Review of Systems  Constitutional:  Positive for malaise/fatigue.  Respiratory:  Positive for shortness of breath.   Cardiovascular:  Positive for leg swelling.  Gastrointestinal:  Positive for nausea.  Genitourinary:  Positive for frequency.  Musculoskeletal:  Positive for back pain.  All other systems reviewed and are negative.   Allergies  Allergen Reactions   Morphine  Nervous    Past Medical History:  Diagnosis Date   Allergic rhinitis    Arthritis    Back ,hips   Cancer (HCC)    Complication of anesthesia    awareness during general anesthesia.   Dysrhythmia    A-Fib and PSVT   History of atrial fibrillation    episode 2013 converted with cardizem-- happened at time acute illness due to kidney stone --  resolved  no issues since   History of kidney stones    History of sepsis    urosepsis  2013 due to kidney stone   Hypertension    Left nephrolithiasis    Left ureteral calculus    Neuromuscular disorder (HCC)    PAC (premature atrial contraction)    Solitary lung nodule    benign right middle lobe per CT    Past Surgical History:  Procedure Laterality Date   BACK SURGERY  2015   rod and screws   CATARACT EXTRACTION W/ INTRAOCULAR LENS  IMPLANT, BILATERAL  10/02/2008   CYSTOSCOPY WITH BIOPSY N/A 04/08/2019    Procedure: CYSTOSCOPY WITH bladder BIOPSY, FULGURATION;  Surgeon: Jerilee Field, MD;  Location: [facility];  Service: Urology;  Laterality: N/A;   CYSTOSCOPY WITH STENT PLACEMENT Left 09/15/2014   Procedure: CYSTOSCOPY WITH STENT PLACEMENT;  Surgeon: Doyne Keel, MD;  Location: [facility];  Service: Urology;  Laterality: Left;   CYSTOSCOPY/RETROGRADE/URETEROSCOPY Left 08/09/2014   Procedure: CYSTOSCOPY/RETROGRADE/LEFT STENT;  Surgeon: Valetta Fuller, MD;  Location: [facility];  Service: Urology;  Laterality: Left;   CYSTOSCOPY/URETEROSCOPY/HOLMIUM LASER/STENT PLACEMENT Left 09/15/2022   Procedure: CYSTOSCOPY LEFT URETEROSCOPY/STONE BASKET EXTRACTION;  Surgeon: Bjorn Pippin, MD;  Location: [facility];  Service: Urology;  Laterality: Left;  1 HR FOR CASE   EXTRACORPOREAL SHOCK WAVE LITHOTRIPSY  10/03/2011   HOLMIUM LASER APPLICATION Left 09/15/2014   Procedure: HOLMIUM LASER APPLICATION;  Surgeon: Doyne Keel, MD;  Location: [facility];  Service: Urology;  Laterality: Left;   IR URETERAL STENT LEFT NEW ACCESS W/O SEP NEPHROSTOMY CATH  08/18/2022   LAPAROTOMY W/  UNILATERAL SALPINGOOPHORECTOMY  age 70   NEPHROLITHOTOMY Left 08/18/2022   Procedure: LEFT NEPHROLITHOTOMY PERCUTANEOUS;  Surgeon: Bjorn Pippin, MD;  Location: [facility];  Service: Urology;  Laterality: Left;  2.5 HRS FOR CASE   TOTAL ABDOMINAL HYSTERECTOMY  age 47   w/ unilateral salpingoophorectomy   TRANSTHORACIC ECHOCARDIOGRAM  02/13/2012   mild LVH/  ef 65%/  trivial MR  &  PR/  mild RVE   URETEROSCOPY Left 09/15/2014   Procedure: URETEROSCOPY;  Surgeon: Doyne Keel, MD;  Location: [facility];  Service: Urology;  Laterality: Left;    Social History   Socioeconomic History   Marital status: Married    Spouse name: Not on file   Number of children: 1   Years of education: Not on file   Highest education level: Not on file  Occupational History    Employer: RETIRED   Tobacco Use   Smoking status: Never    Passive exposure: Never   Smokeless tobacco: Never  Vaping Use   Vaping Use: Never used  Substance and Sexual Activity   Alcohol use: No   Drug use: No   Sexual activity: Not Currently  Other Topics Concern   Not on file  Social History Narrative   Not on file   Social Determinants of Health   Financial Resource Strain: Low Risk  (12/14/2022)   Overall Financial Resource Strain (CARDIA)  Difficulty of Paying Living Expenses: Not hard at all  Food Insecurity: No Food Insecurity (12/14/2022)   Hunger Vital Sign    Worried About Running Out of Food in the Last Year: Never true    Ran Out of Food in the Last Year: Never true  Transportation Needs: No Transportation Needs (12/14/2022)   PRAPARE - Administrator, Civil Service (Medical): No    Lack of Transportation (Non-Medical): No  Physical Activity: Inactive (12/14/2022)   Exercise Vital Sign    Days of Exercise per Week: 0 days    Minutes of Exercise per Session: 0 min  Stress: No Stress Concern Present (12/14/2022)   [employer] of Occupational Health - Occupational Stress Questionnaire    Feeling of Stress : Not at all  Social Connections: Socially Integrated (12/14/2022)   Social Connection and Isolation Panel [NHANES]    Frequency of Communication with Friends and Family: More than three times a week    Frequency of Social Gatherings with Friends and Family: More than three times a week    Attends Religious Services: 1 to 4 times per year    Active Member of [employer] or Organizations: Yes    Attends Banker Meetings: 1 to 4 times per year    Marital Status: Married  Catering manager Violence: Not At Risk (12/14/2022)   Humiliation, Afraid, Rape, and Kick questionnaire    Fear of Current or Ex-Partner: No    Emotionally Abused: No    Physically Abused: No    Sexually Abused: No    Family History  Problem Relation Age of Onset   Cancer Other         Lung and bladder   Cancer Other        "Bone marrow cancer"    Anti-infectives: Anti-infectives (From admission, onward)    None       Current Outpatient Medications  Medication Sig Dispense Refill   acetaminophen (TYLENOL) 325 MG tablet Take 2 tablets (650 mg total) by mouth every 6 (six) hours as needed for mild pain (or Fever >/= 101). 30 tablet 0   apixaban (ELIQUIS) 5 MG TABS tablet Take 1 tablet (5 mg total) by mouth 2 (two) times daily. 60 tablet 5   fluticasone (FLONASE) 50 MCG/ACT nasal spray Place 2 sprays into the nose daily as needed for allergies.      furosemide (LASIX) 20 MG tablet Take 2 tablets (40 mg total) by mouth daily. (Patient taking differently: Take 20 mg by mouth daily.) 30 tablet 3   losartan (COZAAR) 50 MG tablet Take 1 tablet (50 mg total) by mouth daily. 90 tablet 3   Oxycodone HCl 10 MG TABS Take 10 mg by mouth 4 (four) times daily as needed.     oxyCODONE-acetaminophen (PERCOCET) 10-325 MG tablet Take 1 tablet by mouth 4 (four) times daily as needed for pain.     polyethylene glycol (MIRALAX / GLYCOLAX) 17 g packet Take 17 g by mouth daily as needed for moderate constipation.     potassium chloride (KLOR-CON M) 10 MEQ tablet Take 1 tablet (10 mEq total) by mouth daily. 90 tablet 1   Prucalopride Succinate (MOTEGRITY) 2 MG TABS Take 1 tablet (2 mg total) by mouth daily. 30 tablet 3   sertraline (ZOLOFT) 50 MG tablet Take 50 mg by mouth daily.     No current facility-administered medications for this visit.     Objective: Vital signs in last 24 hours: BP (!) 147/70  Pulse 80   Ht 5\' 6"  (1.676 m)   Wt 140 lb 6.4 oz (63.7 kg)   BMI 22.66 kg/m   Intake/Output from previous day: No intake/output data recorded. Intake/Output this shift: @IOTHISSHIFT @   Physical Exam Vitals reviewed.  Constitutional:      Appearance: Normal appearance.  Abdominal:     General: Abdomen is flat.     Palpations: Abdomen is soft.     Tenderness: There is left  CVA tenderness.  Neurological:     Mental Status: She is alert.     Lab Results:  Results for orders placed or performed in visit on 03/01/23 (from the past 24 hour(s))  Urinalysis, Routine w reflex microscopic     Status: Abnormal   Collection Time: 03/01/23  2:11 PM  Result Value Ref Range   Specific Gravity, UA 1.020 1.005 - 1.030   pH, UA 5.5 5.0 - 7.5   Color, UA Yellow Yellow   Appearance Ur Clear Clear   Leukocytes,UA Trace (A) Negative   Protein,UA Trace Negative/Trace   Glucose, UA Negative Negative   Ketones, UA Negative Negative   RBC, UA 3+ (A) Negative   Bilirubin, UA Negative Negative   Urobilinogen, Ur 2.0 (H) 0.2 - 1.0 mg/dL   Nitrite, UA Negative Negative   Microscopic Examination See below:    Narrative   Performed at:  [address] - [facility] [address] [place], [place]  [id number] Lab Director: Chinita Pester MT, Phone:  [phone]  Microscopic Examination     Status: Abnormal   Collection Time: 03/01/23  2:11 PM   Urine  Result Value Ref Range   WBC, UA 0-5 0 - 5 /hpf   RBC, Urine >30 (A) 0 - 2 /hpf   Epithelial Cells (non renal) 0-10 0 - 10 /hpf   Bacteria, UA Few (A) None seen/Few   Narrative   Performed at:  [address] - [facility] [address], [place], [place]  [id number] Lab Director: Chinita Pester MT, Phone:  [phone]       BMET No results for input(s): "NA", "K", "CL", "CO2", "GLUCOSE", "BUN", "CREATININE", "CALCIUM" in the last 72 hours. PT/INR No results for input(s): "LABPROT", "INR" in the last 72 hours. ABG No results for input(s): "PHART", "HCO3" in the last 72 hours.  Invalid input(s): "PCO2", "PO2" UA has >30 RBC's with 0-5 WBC and a few bacteria.   Studies/Results: No results found. CT Abdomen Pelvis W Contrast  Result Date: 12/28/2022 CLINICAL DATA:  LEFT lower quadrant abdominal pain going around to back, constipation, started Motegrity a couple weeks ago, a little bloating. History of atrial  fibrillation, kidney stones EXAM: CT ABDOMEN AND PELVIS WITH CONTRAST TECHNIQUE: Multidetector CT imaging of the abdomen and pelvis was performed using the standard protocol following bolus administration of intravenous contrast. RADIATION DOSE REDUCTION: This exam was performed according to the departmental dose-optimization program which includes automated exposure control, adjustment of the mA and/or kV according to patient size and/or use of iterative reconstruction technique. CONTRAST:  75mL OMNIPAQUE IOHEXOL 300 MG/ML SOLN IV. Dilute oral contrast. COMPARISON:  08/19/2022 FINDINGS: Lower chest: Bibasilar atelectasis.  Small RIGHT pleural effusion. Hepatobiliary: Minimally distended gallbladder. Liver unremarkable. No biliary dilatation. Pancreas: Atrophic pancreas without mass Spleen: Normal appearance Adrenals/Urinary Tract: Adrenal glands normal appearance. BILATERAL nonobstructing renal calculi. BILATERAL renal cortical thinning. No renal mass or hydronephrosis. Bladder and ureters unremarkable. Stomach/Bowel: Appendix not visualized. Stomach and bowel loops unremarkable. Vascular/Lymphatic: Atherosclerotic calcifications aorta without aneurysm. Vascular structures grossly  patent. No adenopathy. Reproductive: Uterus surgically absent. Nonvisualization of ovaries. Other: No free air or free fluid. No hernia or inflammatory process. Musculoskeletal: Prior lumbosacral fusion. Osseous demineralization. No acute osseous findings. IMPRESSION: BILATERAL nonobstructing renal calculi. Bibasilar atelectasis with small RIGHT pleural effusion. No acute intra-abdominal or intrapelvic abnormalities. Aortic Atherosclerosis (ICD10-I70.0). Electronically Signed   By: Ulyses Southward M.D.   On: 12/28/2022 09:25   DG Abdomen 1 View  Result Date: 12/10/2022 CLINICAL DATA:  Short of breath, multifocal pain EXAM: ABDOMEN - 1 VIEW COMPARISON:  05/05/2022 FINDINGS: Supine frontal view of the abdomen and pelvis excludes the  hemidiaphragms and left flank by collimation. Bowel gas pattern is unremarkable without obstruction or ileus. No masses or abnormal calcifications. Stable postsurgical changes of the lumbar spine. Visualized portions of the bony pelvis are normal. IMPRESSION: 1. Unremarkable bowel gas pattern. Electronically Signed   By: Sharlet Salina M.D.   On: 12/10/2022 14:10   DG Chest Portable 1 View  Result Date: 12/10/2022 CLINICAL DATA:  Short of breath, multifocal pain EXAM: PORTABLE CHEST 1 VIEW COMPARISON:  09/15/2022 FINDINGS: Single frontal view of the chest demonstrates an enlarged cardiac silhouette. There is increased central vascular congestion, with stable small left pleural effusion and progressive moderate right pleural effusion identified. Bibasilar areas of consolidation may reflect airspace disease or atelectasis. No pneumothorax. No acute bony abnormality. IMPRESSION: 1. Worsening volume status, with increasing central vascular congestion and enlarged right pleural effusion as above. Stable small left pleural effusion. Electronically Signed   By: Sharlet Salina M.D.   On: 12/10/2022 14:09     Assessment/Plan: Left flank and back pain that is probably musculoskeletal but she has had increased hematuria.   I will repeat a CT stone study to make sure she hasn't dropped a stone into the ureter. Michaell Cowing hematuria.  She had an episode of hematuria and now has microhematuria.   Renal stones.   She has bilateral small non-obstructing renal stones that were noted on her CT on 12/26/22.    Chronic cystitis.   UA just has  microhematuria.  I will get a culture.   Sensation of incomplete emptying.   That has improved.   No orders of the defined types were placed in this encounter.    Orders Placed This Encounter  Procedures   Urine Culture   Microscopic Examination   CT RENAL STONE STUDY    Standing Status:   Future    Standing Expiration Date:   06/01/2023    Order Specific Question:    Preferred imaging location?    Answer:   [facility]    Order Specific Question:   Radiology Contrast Protocol - do NOT remove file path    Answer:   [email or web address]   Urinalysis, Routine w reflex microscopic     Return in about 3 months (around 06/01/2023) for I will contact her with the CT and culture results.   .    CC: Dr. Fara Chute.      Bjorn Pippin 03/02/2023

## 2023-03-02 DIAGNOSIS — E782 Mixed hyperlipidemia: Secondary | ICD-10-CM | POA: Diagnosis not present

## 2023-03-02 DIAGNOSIS — I4891 Unspecified atrial fibrillation: Secondary | ICD-10-CM | POA: Diagnosis not present

## 2023-03-02 DIAGNOSIS — I10 Essential (primary) hypertension: Secondary | ICD-10-CM | POA: Diagnosis not present

## 2023-03-03 LAB — URINE CULTURE

## 2023-03-12 ENCOUNTER — Emergency Department (HOSPITAL_COMMUNITY): Payer: PPO

## 2023-03-12 ENCOUNTER — Ambulatory Visit (HOSPITAL_COMMUNITY): Payer: PPO

## 2023-03-12 ENCOUNTER — Other Ambulatory Visit: Payer: Self-pay

## 2023-03-12 ENCOUNTER — Emergency Department (HOSPITAL_COMMUNITY)
Admission: EM | Admit: 2023-03-12 | Discharge: 2023-03-12 | Disposition: A | Discharge status: Home / Self Care | Payer: PPO | Attending: Emergency Medicine | Admitting: Emergency Medicine

## 2023-03-12 DIAGNOSIS — N200 Calculus of kidney: Secondary | ICD-10-CM | POA: Diagnosis not present

## 2023-03-12 DIAGNOSIS — R109 Unspecified abdominal pain: Secondary | ICD-10-CM | POA: Insufficient documentation

## 2023-03-12 DIAGNOSIS — M545 Low back pain, unspecified: Secondary | ICD-10-CM | POA: Diagnosis not present

## 2023-03-12 DIAGNOSIS — I10 Essential (primary) hypertension: Secondary | ICD-10-CM | POA: Diagnosis not present

## 2023-03-12 DIAGNOSIS — Z7901 Long term (current) use of anticoagulants: Secondary | ICD-10-CM | POA: Insufficient documentation

## 2023-03-12 LAB — COMPREHENSIVE METABOLIC PANEL
ALT: 16 U/L (ref 0–44)
AST: 27 U/L (ref 15–41)
Albumin: 3.8 g/dL (ref 3.5–5.0)
Alkaline Phosphatase: 59 U/L (ref 38–126)
Anion gap: 10 (ref 5–15)
BUN: 9 mg/dL (ref 8–23)
CO2: 27 mmol/L (ref 22–32)
Calcium: 9.1 mg/dL (ref 8.9–10.3)
Chloride: 100 mmol/L (ref 98–111)
Creatinine, Ser: 0.78 mg/dL (ref 0.44–1.00)
GFR, Estimated: >60 mL/min (ref >60)
Glucose, Bld: 96 mg/dL (ref 70–99)
Potassium: 3.5 mmol/L (ref 3.5–5.1)
Sodium: 137 mmol/L (ref 135–145)
Total Bilirubin: 0.8 mg/dL (ref 0.3–1.2)
Total Protein: 7.9 g/dL (ref 6.5–8.1)

## 2023-03-12 LAB — CBC
HCT: 40.2 % (ref 36.0–46.0)
Hemoglobin: 13.4 g/dL (ref 12.0–15.0)
MCH: 34.1 pg — ABNORMAL HIGH (ref 26.0–34.0)
MCHC: 33.3 g/dL (ref 30.0–36.0)
MCV: 102.3 fL — ABNORMAL HIGH (ref 80.0–100.0)
Platelets: 254 10*3/uL (ref 150–400)
RBC: 3.93 MIL/uL (ref 3.87–5.11)
RDW: 12.9 % (ref 11.5–15.5)
WBC: 5.6 10*3/uL (ref 4.0–10.5)
nRBC: 0 % (ref 0.0–0.2)

## 2023-03-12 LAB — URINALYSIS, ROUTINE W REFLEX MICROSCOPIC
Bilirubin Urine: NEGATIVE
Glucose, UA: NEGATIVE mg/dL
Ketones, ur: NEGATIVE mg/dL
Leukocytes,Ua: NEGATIVE
Nitrite: NEGATIVE
Protein, ur: NEGATIVE mg/dL
RBC / HPF: >50 RBC/hpf (ref 0–5)
Specific Gravity, Urine: 1.008 (ref 1.005–1.030)
pH: 7 (ref 5.0–8.0)

## 2023-03-12 LAB — LIPASE, BLOOD: Lipase: 26 U/L (ref 11–51)

## 2023-03-12 MED ORDER — FENTANYL CITRATE PF 50 MCG/ML IJ SOSY
50.0000 ug | PREFILLED_SYRINGE | Freq: Once | INTRAMUSCULAR | Status: AC
  Administered 2023-03-12: 50 ug via INTRAVENOUS
  Filled 2023-03-12: qty 1

## 2023-03-12 MED ORDER — FENTANYL CITRATE PF 50 MCG/ML IJ SOSY
25.0000 ug | PREFILLED_SYRINGE | Freq: Once | INTRAMUSCULAR | Status: AC
  Administered 2023-03-12: 25 ug via INTRAVENOUS
  Filled 2023-03-12: qty 1

## 2023-03-12 NOTE — ED Notes (Signed)
Pt taken to room and requested to use the restroom.  Urine sample had already been obtained.  Pt went to restroom independently.

## 2023-03-12 NOTE — Discharge Instructions (Signed)
Your blood pressure this evening is elevated.  Please continue to take your blood pressure medication daily as directed.  This will need to be rechecked by your primary care provider this week.  The CT scan this evening did not show evidence of any obstructing kidney stones.  You do have some blood in your urine without evidence of infection.  Your symptoms could be related to a recently passed kidney stone or could be coming from your lower back.  Continue taking your pain medication as directed.  You may also try the over-the-counter lidocaine patches to your lower back area.  Please contact Dr. Belva Crome office regarding follow-up as well.  Return to the emergency department for any new or worsening symptoms.

## 2023-03-12 NOTE — ED Triage Notes (Signed)
Pt presents with left flank pain.  Hx of kidney stones and back problems.  Significantly worse today but has been intermittent  for a few days.  No n/v/d.    States Dr. Annabell Howells had scheduled a CT scan for 530 today but pt did not feel she could wait.

## 2023-03-13 ENCOUNTER — Telehealth: Payer: Self-pay | Admitting: Internal Medicine

## 2023-03-13 DIAGNOSIS — I10 Essential (primary) hypertension: Secondary | ICD-10-CM | POA: Diagnosis not present

## 2023-03-13 DIAGNOSIS — Z6821 Body mass index (BMI) 21.0-21.9, adult: Secondary | ICD-10-CM | POA: Diagnosis not present

## 2023-03-13 NOTE — Telephone Encounter (Signed)
Daughter reports that blood pressure checked today and BP was 180/100 & HR 100. Reports BP was high yesterday and was advised by ED provider it was related to pain.  Reports prior to flank pain, blood pressure was normal.  Reports SOB that started today. Denies chest pain, dizziness, nausea, vomiting, fever, cough or congestion. Denies weight gain or swelling.  Daughter request sooner appointment. Appointment changed to 03/19/2023. Daughter says patient has appointment with PCP this afternoon for ED f/u and BP. Advised to take home BP monitor to visit so that it could be checked for accuracy. Advised if PCP felt she needed a sooner appointment than 03/19/2023 to contact office to check for availability. Verbalized understanding of plan.

## 2023-03-13 NOTE — Telephone Encounter (Signed)
Pt c/o BP issue: STAT if pt c/o blurred vision, one-sided weakness or slurred speech  1. What are your last 5 BP readings?   180/100 today  2. Are you having any other symptoms (ex. Dizziness, headache, blurred vision, passed out)?   Headache yesterday (daughter not with patient at this time).   3. What is your BP issue?   Daughter noted patient had ED visit yesterday due to BP at 200/95-100?  Daughter stated patient has been feeling "shaky".

## 2023-03-15 NOTE — ED Provider Notes (Signed)
[place] EMERGENCY DEPARTMENT AT [facility] Provider Note   CSN: [id number] Arrival date & time: 03/12/23  1600     History  Chief Complaint  Patient presents with   Flank Pain    Tanya Mueller is a 88 y.o. female.   Flank Pain Pertinent negatives include no chest pain, no abdominal pain and no shortness of breath.       Tanya Mueller is a 88 y.o. female who presents to the Emergency Department complaining of left flank pain, left buttock and left upper leg pain and low back pain.  Has history of kidney stones and prior chronic back pain.  Followed by urology.  States her urologist has scheduled an outpatient CT of her kidneys.  Pain worsened on day of arrival which prompted her ER visit.  She denies any pain in her abdomen, fever, chills, nausea, vomiting.  She also denies any numbness or weakness of her lower extremities.  No urine or bowel incontinence.  Home Medications Prior to Admission medications   Medication Sig Start Date End Date Taking? Authorizing Provider  acetaminophen (TYLENOL) 325 MG tablet Take 2 tablets (650 mg total) by mouth every 6 (six) hours as needed for mild pain (or Fever >/= 101). 09/16/22   Shon Hale, MD  apixaban (ELIQUIS) 5 MG TABS tablet Take 1 tablet (5 mg total) by mouth 2 (two) times daily. 09/16/22   Shon Hale, MD  fluticasone (FLONASE) 50 MCG/ACT nasal spray Place 2 sprays into the nose daily as needed for allergies.     [provider]  furosemide (LASIX) 20 MG tablet Take 20 mg by mouth daily.    [provider]  losartan (COZAAR) 50 MG tablet Take 1 tablet (50 mg total) by mouth daily. 08/29/22 08/24/23  Mallipeddi, Vishnu P, MD  Oxycodone HCl 10 MG TABS Take 10 mg by mouth 4 (four) times daily as needed. 12/01/22   [provider]  oxyCODONE-acetaminophen (PERCOCET) 10-325 MG tablet Take 1 tablet by mouth 4 (four) times daily as needed for pain. 08/28/22   [provider]   polyethylene glycol (MIRALAX / GLYCOLAX) 17 g packet Take 17 g by mouth daily as needed for moderate constipation.    [provider]  potassium chloride (KLOR-CON M) 10 MEQ tablet Take 1 tablet (10 mEq total) by mouth daily. 12/14/22   Mallipeddi, Vishnu P, MD  Prucalopride Succinate (MOTEGRITY) 2 MG TABS Take 1 tablet (2 mg total) by mouth daily. 12/07/22   Tiffany Kocher, PA-C  sertraline (ZOLOFT) 50 MG tablet Take 50 mg by mouth daily. 10/30/22   [provider]      Allergies    Morphine    Review of Systems   Review of Systems  Respiratory:  Negative for shortness of breath.   Cardiovascular:  Negative for chest pain.  Gastrointestinal:  Negative for abdominal pain, diarrhea, nausea and vomiting.  Genitourinary:  Positive for flank pain. Negative for decreased urine volume, difficulty urinating and hematuria.  Musculoskeletal:  Positive for back pain.  Neurological:  Negative for weakness and numbness.    Physical Exam Updated Vital Signs BP (!) 191/95 (BP Location: Right Arm)   Pulse 72   Temp 98.1 F (36.7 C) (Oral)   Resp 15   SpO2 96%  Physical Exam Vitals and nursing note reviewed.  Constitutional:      General: She is not in acute distress.    Appearance: Normal appearance. She is not ill-appearing or toxic-appearing.  Cardiovascular:     Rate and Rhythm: Normal rate and regular rhythm.     Pulses: Normal pulses.  Pulmonary:     Effort: Pulmonary effort is normal.  Chest:     Chest wall: No tenderness.  Abdominal:     Palpations: Abdomen is soft.     Tenderness: There is no abdominal tenderness. There is no right CVA tenderness or left CVA tenderness.  Musculoskeletal:        General: Tenderness present. No swelling.     Lumbar back: No swelling. Normal range of motion. Negative right straight leg raise test and negative left straight leg raise test. Scoliosis present.     Right lower leg: No edema.     Left lower leg: No edema.      Comments: Tender to palpation of the left lower lumbar paraspinal muscles.  No midline tenderness or bony step-offs.  Skin:    General: Skin is warm.     Capillary Refill: Capillary refill takes less than 2 seconds.     Findings: No rash.  Neurological:     General: No focal deficit present.     Mental Status: She is alert.     Sensory: No sensory deficit.     Motor: No weakness.     ED Results / Procedures / Treatments   Labs (all labs ordered are listed, but only abnormal results are displayed) Labs Reviewed  URINALYSIS, ROUTINE W REFLEX MICROSCOPIC - Abnormal; Notable for the following components:      Result Value   Hgb urine dipstick MODERATE (*)    Bacteria, UA RARE (*)    All other components within normal limits  CBC - Abnormal; Notable for the following components:   MCV 102.3 (*)    MCH 34.1 (*)    All other components within normal limits  LIPASE, BLOOD  COMPREHENSIVE METABOLIC PANEL    EKG None  Radiology No results found.  Procedures Procedures    Medications Ordered in ED Medications  fentaNYL (SUBLIMAZE) injection 25 mcg (25 mcg Intravenous Given 03/12/23 1932)  fentaNYL (SUBLIMAZE) injection 50 mcg (50 mcg Intravenous Given 03/12/23 2032)    ED Course/ Medical Decision Making/ A&P                             Medical Decision Making Patient here for evaluation of possible kidney stone.  Also has history of chronic back pain.  She is followed by urology.  She denies any urinary symptoms but does have history of prior kidney stone.  No known injury, abdominal pain fever or nausea vomiting.  No red flags on exam or saddle anesthesias that would be concerning for cauda equina.  No CVA tenderness suggestive of pyelonephritis  Amount and/or Complexity of Data Reviewed Labs: ordered.    Details: Labs interpreted by me, no evidence of leukocytosis, urinalysis shows hematuria without evidence of infection, chemistries without derangement.  Lipase  unremarkable. Radiology: ordered.    Details: CT renal stone study without evidence of hydronephrosis or ureteral stone. Discussion of management or test interpretation with external provider(s): Patient is ambulatory in the department with steady gait.  Ambulated to the restroom without difficulty.  I suspect flank pain related to sciatica.  Workup without evidence of acute ureteral stone or infection. Discussed findings with patient and daughter at bedside.  Patient is hypertensive, no evidence of endorgan damage.  Has antihypertensive medications at home.  Some of her hypertension may  be related to level of pain.  Discussed importance of close outpatient follow-up with PCP regarding her blood pressure and she is agreeable to this plan.  Appears appropriate for discharge home.  Return precautions were discussed  Risk Prescription drug management.           Final Clinical Impression(s) / ED Diagnoses Final diagnoses:  Left flank pain  Hypertension, unspecified type    Rx / DC Orders ED Discharge Orders     None         Pauline Aus, PA-C 03/15/23 1545    Bethann Berkshire, MD 03/17/23 1102

## 2023-03-19 ENCOUNTER — Encounter: Payer: Self-pay | Admitting: Internal Medicine

## 2023-03-19 ENCOUNTER — Telehealth: Payer: Self-pay | Admitting: Internal Medicine

## 2023-03-19 ENCOUNTER — Ambulatory Visit: Payer: PPO | Attending: Internal Medicine | Admitting: Internal Medicine

## 2023-03-19 VITALS — BP 118/72 | HR 68 | Ht 66.0 in | Wt 138.0 lb

## 2023-03-19 DIAGNOSIS — Z6821 Body mass index (BMI) 21.0-21.9, adult: Secondary | ICD-10-CM | POA: Diagnosis not present

## 2023-03-19 DIAGNOSIS — M545 Low back pain, unspecified: Secondary | ICD-10-CM | POA: Diagnosis not present

## 2023-03-19 DIAGNOSIS — R002 Palpitations: Secondary | ICD-10-CM | POA: Diagnosis not present

## 2023-03-19 DIAGNOSIS — I5030 Unspecified diastolic (congestive) heart failure: Secondary | ICD-10-CM | POA: Insufficient documentation

## 2023-03-19 DIAGNOSIS — N200 Calculus of kidney: Secondary | ICD-10-CM | POA: Diagnosis not present

## 2023-03-19 DIAGNOSIS — I10 Essential (primary) hypertension: Secondary | ICD-10-CM | POA: Diagnosis not present

## 2023-03-19 DIAGNOSIS — I517 Cardiomegaly: Secondary | ICD-10-CM | POA: Insufficient documentation

## 2023-03-19 DIAGNOSIS — I5032 Chronic diastolic (congestive) heart failure: Secondary | ICD-10-CM

## 2023-03-19 MED ORDER — DILTIAZEM HCL 60 MG PO TABS: 60.0000 mg | ORAL_TABLET | Freq: Three times a day (TID) | ORAL | 1 refills | Status: DC | PRN

## 2023-03-19 MED ORDER — FUROSEMIDE 20 MG PO TABS: 20.0000 mg | ORAL_TABLET | Freq: Every day | ORAL | 6 refills | Status: DC

## 2023-03-19 NOTE — Patient Instructions (Addendum)
Medication Instructions:  Your physician has recommended you make the following change in your medication:  Start diltiazem 60 mg every 8 hours as needed for palpitations Continue furosemide 20 mg daily. May take an additional 20 mg daily as needed for shortness of breath or leg swelling Continue all other medication as prescribed  Labwork: none  Testing/Procedures: Your physician has requested that you have a limited echocardiogram. Echocardiography is a painless test that uses sound waves to create images of your heart. It provides your doctor with information about the size and shape of your heart and how well your heart's chambers and valves are working. This procedure takes approximately one hour. There are no restrictions for this procedure. Please do NOT wear cologne, perfume, aftershave, or lotions (deodorant is allowed). Please arrive 15 minutes prior to your appointment time.  Follow-Up: Your physician recommends that you schedule a follow-up appointment in: 6 months  Any Other Special Instructions Will Be Listed Below (If Applicable).  If you need a refill on your cardiac medications before your next appointment, please call your pharmacy.

## 2023-03-19 NOTE — Telephone Encounter (Signed)
Pt c/o medication issue:  1. Name of Medication:   apixaban (ELIQUIS) 5 MG TABS tablet   2. How are you currently taking this medication (dosage and times per day)? As prescribed  3. Are you having a reaction (difficulty breathing--STAT)?   No  4. What is your medication issue?   Daughter stated patient will need some samples until she gets her prescription filled.

## 2023-03-19 NOTE — Progress Notes (Signed)
Cardiology Office Note  Date: 03/19/2023   ID: Tanya Mueller, DOB [birth year], MRN [id number]  PCP:  Estanislado Pandy, MD  Cardiologist:  Marjo Bicker, MD Electrophysiologist:  None   Reason for Office Visit: Follow-up of A-fib/flutter  History of Present Illness: Tanya Mueller is a 88 y.o. female known to have paroxysmal A-fib and paroxysmal atrial flutter, HTN is here for follow-up visit.  Patient was previously followed by Dr. Purvis Sheffield for palpitations for which she had a event monitor in 2018 that showed symptomatic PACs and echo showed normal LVEF and no valve abnormalities. She was eventually diagnosed with atrial fibrillation on EKG and Apple Watch EKG strips in 09/2022 after which patient was started on metoprolol tartrate 50 mg twice daily. She underwent a cystoscopy in the second week of December 2023 with no complications but upon discharge from the hospital, she came right back to ER with A-fib with RVR, on diltiazem drip, stabilized and increased dose of metoprolol tartrate from 50 mg to 75 mg twice daily upon discharge. Coumadin was also switched to Eliquis. In 10/2022, she continues to have palpitations with heart rate ranging between 70 and 100s. Amiodarone was started with controlled heart rates but she developed new symptoms of DOE.  EKG from 2/24 showed new onset atrial flutter.  She was eventually admitted to the [facility] in 12/2022 for acute on chronic diastolic heart failure exacerbation stabilized on IV Lasix.  Since discharge from the hospital, she developed 5-6 times episodes of DOE.  No palpitations, syncope, leg swelling, angina. She wears Apple Watch that has EKG lead functionality and it showed normal sinus rhythm so far.  Amiodarone was discontinued in 5/24 due to trembling of her hands.  Past Medical History:  Diagnosis Date   Allergic rhinitis    Arthritis    Back ,hips   Cancer (HCC)    Complication of anesthesia    awareness during general  anesthesia.   Dysrhythmia    A-Fib and PSVT   History of atrial fibrillation    episode 2013 converted with cardizem-- happened at time acute illness due to kidney stone --  resolved  no issues since   History of kidney stones    History of sepsis    urosepsis  2013 due to kidney stone   Hypertension    Left nephrolithiasis    Left ureteral calculus    Neuromuscular disorder (HCC)    PAC (premature atrial contraction)    Solitary lung nodule    benign right middle lobe per CT    Past Surgical History:  Procedure Laterality Date   BACK SURGERY  2015   rod and screws   CATARACT EXTRACTION W/ INTRAOCULAR LENS  IMPLANT, BILATERAL  10/02/2008   CYSTOSCOPY WITH BIOPSY N/A 04/08/2019   Procedure: CYSTOSCOPY WITH bladder BIOPSY, FULGURATION;  Surgeon: Jerilee Field, MD;  Location: [facility];  Service: Urology;  Laterality: N/A;   CYSTOSCOPY WITH STENT PLACEMENT Left 09/15/2014   Procedure: CYSTOSCOPY WITH STENT PLACEMENT;  Surgeon: Doyne Keel, MD;  Location: [facility];  Service: Urology;  Laterality: Left;   CYSTOSCOPY/RETROGRADE/URETEROSCOPY Left 08/09/2014   Procedure: CYSTOSCOPY/RETROGRADE/LEFT STENT;  Surgeon: Valetta Fuller, MD;  Location: [facility];  Service: Urology;  Laterality: Left;   CYSTOSCOPY/URETEROSCOPY/HOLMIUM LASER/STENT PLACEMENT Left 09/15/2022   Procedure: CYSTOSCOPY LEFT URETEROSCOPY/STONE BASKET EXTRACTION;  Surgeon: Bjorn Pippin, MD;  Location: [facility];  Service: Urology;  Laterality: Left;  1 HR FOR CASE   EXTRACORPOREAL SHOCK  WAVE LITHOTRIPSY  10/03/2011   HOLMIUM LASER APPLICATION Left 09/15/2014   Procedure: HOLMIUM LASER APPLICATION;  Surgeon: Doyne Keel, MD;  Location[facility];  Service: Urology;  Laterality: Left;   IR URETERAL STENT LEFT NEW ACCESS W/O SEP NEPHROSTOMY CATH  08/18/2022   LAPAROTOMY W/  UNILATERAL SALPINGOOPHORECTOMY  age 89   NEPHROLITHOTOMY Left 08/18/2022   Procedure: LEFT NEPHROLITHOTOMY  PERCUTANEOUS;  Surgeon: Bjorn Pippin, MD;  Location: [facility];  Service: Urology;  Laterality: Left;  2.5 HRS FOR CASE   TOTAL ABDOMINAL HYSTERECTOMY  age 51   w/ unilateral salpingoophorectomy   TRANSTHORACIC ECHOCARDIOGRAM  02/13/2012   mild LVH/  ef 65%/  trivial MR  &  PR/  mild RVE   URETEROSCOPY Left 09/15/2014   Procedure: URETEROSCOPY;  Surgeon: Doyne Keel, MD;  Location: [facility];  Service: Urology;  Laterality: Left;    Current Outpatient Medications  Medication Sig Dispense Refill   acetaminophen (TYLENOL) 325 MG tablet Take 2 tablets (650 mg total) by mouth every 6 (six) hours as needed for mild pain (or Fever >/= 101). 30 tablet 0   apixaban (ELIQUIS) 5 MG TABS tablet Take 1 tablet (5 mg total) by mouth 2 (two) times daily. 60 tablet 5   fluticasone (FLONASE) 50 MCG/ACT nasal spray Place 2 sprays into the nose daily as needed for allergies.      furosemide (LASIX) 20 MG tablet Take 20 mg by mouth daily.     losartan (COZAAR) 50 MG tablet Take 1 tablet (50 mg total) by mouth daily. 90 tablet 3   Oxycodone HCl 10 MG TABS Take 10 mg by mouth 4 (four) times daily as needed.     oxyCODONE-acetaminophen (PERCOCET) 10-325 MG tablet Take 1 tablet by mouth 4 (four) times daily as needed for pain.     polyethylene glycol (MIRALAX / GLYCOLAX) 17 g packet Take 17 g by mouth daily as needed for moderate constipation.     potassium chloride (KLOR-CON M) 10 MEQ tablet Take 1 tablet (10 mEq total) by mouth daily. 90 tablet 1   Prucalopride Succinate (MOTEGRITY) 2 MG TABS Take 1 tablet (2 mg total) by mouth daily. 30 tablet 3   sertraline (ZOLOFT) 50 MG tablet Take 50 mg by mouth daily.     No current facility-administered medications for this visit.   Allergies:  Morphine   Social History: The patient  reports that she has never smoked. She has never been exposed to tobacco smoke. She has never used smokeless tobacco. She reports that she does not drink alcohol and does  not use drugs.   Family History: The patient's family history includes Cancer in some other family members.   ROS:  Please see the history of present illness. Otherwise, complete review of systems is positive for none.  All other systems are reviewed and negative.   Physical Exam: VS:  There were no vitals taken for this visit., BMI There is no height or weight on file to calculate BMI.  Wt Readings from Last 3 Encounters:  03/01/23 140 lb 6.4 oz (63.7 kg)  01/02/23 140 lb 6.4 oz (63.7 kg)  12/20/22 142 lb (64.4 kg)    General: Patient appears comfortable at rest. HEENT: Conjunctiva and lids normal, oropharynx clear with moist mucosa. Neck: Supple, no elevated JVP or carotid bruits, no thyromegaly. Lungs: Clear to auscultation, nonlabored breathing at rest. Cardiac: Irregular rate and rhythm, no S3 or significant systolic murmur, no pericardial rub.  Abdomen: Soft, nontender, no hepatomegaly, bowel sounds present, no guarding or rebound. Extremities: No pitting edema, distal pulses 2+. Skin: Warm and dry. Musculoskeletal: No kyphosis. Neuropsychiatric: Alert and oriented x3, affect grossly appropriate.  ECG today:  Atrial Fibrillation  Recent Labwork: 09/15/2022: TSH 1.165 12/10/2022: B Natriuretic Peptide 427.0 12/12/2022: Magnesium 1.8 03/12/2023: ALT 16; AST 27; BUN 9; Creatinine, Ser 0.78; Hemoglobin 13.4; Platelets 254; Potassium 3.5; Sodium 137  No results found for: "CHOL", "TRIG", "HDL", "CHOLHDL", "VLDL", "LDLCALC", "LDLDIRECT"  Other Studies Reviewed Today: I personally reviewed the Holter monitor and echo report from 2018 and 2023 Event monitor from 2018 showed PACs but no atrial fibrillation Echo from 2018 and 2023 showed normal LVEF and no valve abnormalities  Assessment and Plan: Patient is a 88 year old F known to have paroxysmal A-fib/paroxysmal atrial flutter, HTN is here for follow-up visit.   # Paroxysmal A-fib # Paroxysmal atrial flutter -Patient was  diagnosed with A-fib in 09/2022 on her Apple Watch.  EKG from 11/2022 showed atrial flutter after starting amiodarone.  She spontaneously converted to NSR during 12/2022 hospitalization for acute on chronic HFpEF.  Metoprolol was discontinued upon discharge due to bradycardia.  Amiodarone was also eventually discontinued on 5/24 due to tremors of her bilateral hands. Currently not on any rate or rhythm controlling agents.  EKG today showed NSR.  Will start diltiazem 60 mg every 8 hours as needed for palpitations.  Continue Eliquis 5 mg twice daily.  # Chronic diastolic heart failure -Patient had DOE 4-6 times since hospital discharge from 12/2022.  Continue p.o. Lasix 20 mg once daily and take an additional dose of Lasix as needed for SOB/LE swelling.  # Moderate RV enlargement in 12/23 echo -Obtain limited echo for RV function and size  # HTN, controlled -Continue losartan 50 mg once daily  I have spent a total of 30 minutes with patient reviewing chart, EKGs, labs and examining patient as well as establishing an assessment and plan that was discussed with the patient.  > 50% of time was spent in direct patient care.     Medication Adjustments/Labs and Tests Ordered: Current medicines are reviewed at length with the patient today.  Concerns regarding medicines are outlined above.   Tests Ordered: Orders Placed This Encounter  Procedures   EKG 12-Lead   ECHOCARDIOGRAM LIMITED      Medication Changes: Meds ordered this encounter  Medications   diltiazem (CARDIZEM) 60 MG tablet    Sig: Take 1 tablet (60 mg total) by mouth every 8 (eight) hours as needed (palpitations).    Dispense:  90 tablet    Refill:  1    03/19/2023 NEW   furosemide (LASIX) 20 MG tablet    Sig: Take 1 tablet (20 mg total) by mouth daily. May take an additional 20 mg daily as needed for leg swelling or shortness of breath    Dispense:  60 tablet    Refill:  6      Disposition:  Follow up  6  month  Signed, Macee Venables Verne Spurr, MD, 03/19/2023 11:16 AM     [facility] [address], [place], [place] [address]

## 2023-03-20 ENCOUNTER — Other Ambulatory Visit: Payer: Self-pay | Admitting: *Deleted

## 2023-03-20 MED ORDER — APIXABAN 5 MG PO TABS: 5.0000 mg | ORAL_TABLET | Freq: Two times a day (BID) | ORAL | 0 refills | Status: DC

## 2023-03-20 NOTE — Telephone Encounter (Signed)
Patient walked in - samples given.

## 2023-03-20 NOTE — Telephone Encounter (Signed)
Will forward to [employer] pharmD for prior authorization.

## 2023-03-20 NOTE — Telephone Encounter (Signed)
  Pt's daughter is calling to f/u. She said, prior auth also need to send for pt's eliquis. She said, she can pick up the samples today while waiting for prior auth

## 2023-03-20 NOTE — Telephone Encounter (Signed)
Sample request for Eliquis:  Indication: AF Last office visit: 03/19/23 Scr: 0.78 Age: 88 Weight: 62.6kg  Based on above findings Eliquis 5mg  twice daily is the appropriate dose.  OK to give samples if available.

## 2023-03-21 ENCOUNTER — Other Ambulatory Visit (HOSPITAL_COMMUNITY): Payer: Self-pay

## 2023-03-21 NOTE — Telephone Encounter (Signed)
Per test claim no PA is needed. Patient has a high cost.

## 2023-03-21 NOTE — Progress Notes (Deleted)
GI Office Note    Referring Provider: Estanislado Pandy, MD Primary Care Physician:  Estanislado Pandy, MD Primary Gastroenterologist: Hennie Duos. Marletta Lor, DO  Date:  03/21/2023  ID:  Tanya Mueller, DOB 04/06/1935, MRN [id number]   Chief Complaint   No chief complaint on file.   History of Present Illness  Tanya Mueller is a 88 y.o. female with a history of constipation, Afib, HTN presenting today with complaint of ***  Noted previously to have failed multiple regimens for OIC including movantik. Linzess, and Amitiza.    CT A/P 12/26/22: -***  CT A/P without contrast 03/12/23 IMPRESSION: 1. Bilateral nonobstructing nephrolithiasis. No obstructing ureteral calculi or hydronephrosis. 2. Moderate colonic stool load.  Today:   Current Outpatient Medications  Medication Sig Dispense Refill   acetaminophen (TYLENOL) 325 MG tablet Take 2 tablets (650 mg total) by mouth every 6 (six) hours as needed for mild pain (or Fever >/= 101). 30 tablet 0   apixaban (ELIQUIS) 5 MG TABS tablet Take 1 tablet (5 mg total) by mouth 2 (two) times daily. 42 tablet 0   diltiazem (CARDIZEM) 60 MG tablet Take 1 tablet (60 mg total) by mouth every 8 (eight) hours as needed (palpitations). 90 tablet 1   fluticasone (FLONASE) 50 MCG/ACT nasal spray Place 2 sprays into the nose daily as needed for allergies.  (Patient not taking: Reported on 03/19/2023)     furosemide (LASIX) 20 MG tablet Take 1 tablet (20 mg total) by mouth daily. May take an additional 20 mg daily as needed for leg swelling or shortness of breath 60 tablet 6   losartan (COZAAR) 50 MG tablet Take 1 tablet (50 mg total) by mouth daily. 90 tablet 3   Oxycodone HCl 10 MG TABS Take 10 mg by mouth 4 (four) times daily as needed.     oxyCODONE-acetaminophen (PERCOCET) 10-325 MG tablet Take 1 tablet by mouth 4 (four) times daily as needed for pain.     polyethylene glycol (MIRALAX / GLYCOLAX) 17 g packet Take 17 g by mouth daily as needed for moderate  constipation.     potassium chloride (KLOR-CON M) 10 MEQ tablet Take 1 tablet (10 mEq total) by mouth daily. 90 tablet 1   Prucalopride Succinate (MOTEGRITY) 2 MG TABS Take 1 tablet (2 mg total) by mouth daily. 30 tablet 3   sertraline (ZOLOFT) 50 MG tablet Take 50 mg by mouth daily.     No current facility-administered medications for this visit.    Past Medical History:  Diagnosis Date   Allergic rhinitis    Arthritis    Back ,hips   Cancer (HCC)    Complication of anesthesia    awareness during general anesthesia.   Dysrhythmia    A-Fib and PSVT   History of atrial fibrillation    episode 2013 converted with cardizem-- happened at time acute illness due to kidney stone --  resolved  no issues since   History of kidney stones    History of sepsis    urosepsis  2013 due to kidney stone   Hypertension    Left nephrolithiasis    Left ureteral calculus    Neuromuscular disorder (HCC)    PAC (premature atrial contraction)    Solitary lung nodule    benign right middle lobe per CT    Past Surgical History:  Procedure Laterality Date   BACK SURGERY  2015   rod and screws   CATARACT EXTRACTION W/ INTRAOCULAR LENS  IMPLANT,  BILATERAL  10/02/2008   CYSTOSCOPY WITH BIOPSY N/A 04/08/2019   Procedure: CYSTOSCOPY WITH bladder BIOPSY, FULGURATION;  Surgeon: Jerilee Field, MD;  Location: [facility];  Service: Urology;  Laterality: N/A;   CYSTOSCOPY WITH STENT PLACEMENT Left 09/15/2014   Procedure: CYSTOSCOPY WITH STENT PLACEMENT;  Surgeon: Doyne Keel, MD;  Location: [facility];  Service: Urology;  Laterality: Left;   CYSTOSCOPY/RETROGRADE/URETEROSCOPY Left 08/09/2014   Procedure: CYSTOSCOPY/RETROGRADE/LEFT STENT;  Surgeon: Valetta Fuller, MD;  Location: [facility];  Service: Urology;  Laterality: Left;   CYSTOSCOPY/URETEROSCOPY/HOLMIUM LASER/STENT PLACEMENT Left 09/15/2022   Procedure: CYSTOSCOPY LEFT URETEROSCOPY/STONE BASKET EXTRACTION;  Surgeon: Bjorn Pippin, MD;   Location: [facility];  Service: Urology;  Laterality: Left;  1 HR FOR CASE   EXTRACORPOREAL SHOCK WAVE LITHOTRIPSY  10/03/2011   HOLMIUM LASER APPLICATION Left 09/15/2014   Procedure: HOLMIUM LASER APPLICATION;  Surgeon: Doyne Keel, MD;  [facility]  Service: Urology;  Laterality: Left;   IR URETERAL STENT LEFT NEW ACCESS W/O SEP NEPHROSTOMY CATH  08/18/2022   LAPAROTOMY W/  UNILATERAL SALPINGOOPHORECTOMY  age 47   NEPHROLITHOTOMY Left 08/18/2022   Procedure: LEFT NEPHROLITHOTOMY PERCUTANEOUS;  Surgeon: Bjorn Pippin, MD;  Location: [facility];  Service: Urology;  Laterality: Left;  2.5 HRS FOR CASE   TOTAL ABDOMINAL HYSTERECTOMY  age 89   w/ unilateral salpingoophorectomy   TRANSTHORACIC ECHOCARDIOGRAM  02/13/2012   mild LVH/  ef 65%/  trivial MR  &  PR/  mild RVE   URETEROSCOPY Left 09/15/2014   Procedure: URETEROSCOPY;  Surgeon: Doyne Keel, MD;  Location: [facility];  Service: Urology;  Laterality: Left;    Family History  Problem Relation Age of Onset   Cancer Other        Lung and bladder   Cancer Other        "Bone marrow cancer"    Allergies as of 03/22/2023 - Review Complete 03/19/2023  Allergen Reaction Noted   Morphine      Social History   Socioeconomic History   Marital status: Married    Spouse name: Not on file   Number of children: 1   Years of education: Not on file   Highest education level: Not on file  Occupational History    Employer: RETIRED  Tobacco Use   Smoking status: Never    Passive exposure: Never   Smokeless tobacco: Never  Vaping Use   Vaping Use: Never used  Substance and Sexual Activity   Alcohol use: No   Drug use: No   Sexual activity: Not Currently  Other Topics Concern   Not on file  Social History Narrative   Not on file   Social Determinants of Health   Financial Resource Strain: Low Risk  (12/14/2022)   Overall Financial Resource Strain (CARDIA)    Difficulty of Paying  Living Expenses: Not hard at all  Food Insecurity: No Food Insecurity (12/14/2022)   Hunger Vital Sign    Worried About Running Out of Food in the Last Year: Never true    Ran Out of Food in the Last Year: Never true  Transportation Needs: No Transportation Needs (12/14/2022)   PRAPARE - Administrator, Civil Service (Medical): No    Lack of Transportation (Non-Medical): No  Physical Activity: Inactive (12/14/2022)   Exercise Vital Sign    Days of Exercise per Week: 0 days    Minutes of Exercise per Session: 0 min  Stress: No Stress  Concern Present (12/14/2022)   [employer] of Occupational Health - Occupational Stress Questionnaire    Feeling of Stress : Not at all  Social Connections: Socially Integrated (12/14/2022)   Social Connection and Isolation Panel [NHANES]    Frequency of Communication with Friends and Family: More than three times a week    Frequency of Social Gatherings with Friends and Family: More than three times a week    Attends Religious Services: 1 to 4 times per year    Active Member of [employer] or Organizations: Yes    Attends Banker Meetings: 1 to 4 times per year    Marital Status: Married     Review of Systems   Gen: Denies fever, chills, anorexia. Denies fatigue, weakness, weight loss.  CV: Denies chest pain, palpitations, syncope, peripheral edema, and claudication. Resp: Denies dyspnea at rest, cough, wheezing, coughing up blood, and pleurisy. GI: See HPI Derm: Denies rash, itching, dry skin Psych: Denies depression, anxiety, memory loss, confusion. No homicidal or suicidal ideation.  Heme: Denies bruising, bleeding, and enlarged lymph nodes.   Physical Exam   There were no vitals taken for this visit.  General:   Alert and oriented. No distress noted. Pleasant and cooperative.  Head:  Normocephalic and atraumatic. Eyes:  Conjuctiva clear without scleral icterus. Mouth:  Oral mucosa pink and moist. Good dentition. No  lesions. Lungs:  Clear to auscultation bilaterally. No wheezes, rales, or rhonchi. No distress.  Heart:  S1, S2 present without murmurs appreciated.  Abdomen:  +BS, soft, non-tender and non-distended. No rebound or guarding. No HSM or masses noted. Rectal: *** Msk:  Symmetrical without gross deformities. Normal posture. Extremities:  Without edema. Neurologic:  Alert and  oriented x4 Psych:  Alert and cooperative. Normal mood and affect.   Assessment  Tanya Mueller is a 88 y.o. female with a history of *** presenting today with   LLQ abdominal pain:   Opioid induced Constipation:   PLAN   *** Motegrity *** Miralax *** Follow up with [facility] or [facility] in ***    Brooke Bonito, MSN, FNP-BC, AGACNP-BC [facility]

## 2023-03-22 ENCOUNTER — Ambulatory Visit: Payer: PPO | Admitting: Gastroenterology

## 2023-03-22 MED ORDER — APIXABAN 5 MG PO TABS: 5.0000 mg | ORAL_TABLET | Freq: Two times a day (BID) | ORAL | Status: DC

## 2023-03-22 MED ORDER — APIXABAN 5 MG PO TABS: 5.0000 mg | ORAL_TABLET | Freq: Two times a day (BID) | ORAL | 6 refills | Status: DC

## 2023-03-22 NOTE — Telephone Encounter (Signed)
Spoke with daughter (Rinda) - notified no PA was required.  She meant that she needed to have another prescription authorized because it had zero refills on.  Refill sent to [facility] now as requested.

## 2023-03-22 NOTE — Addendum Note (Signed)
Addended by: Lesle Chris on: 03/22/2023 03:23 PM   Modules accepted: Orders

## 2023-03-23 ENCOUNTER — Ambulatory Visit: Payer: PPO | Admitting: Internal Medicine

## 2023-03-27 ENCOUNTER — Other Ambulatory Visit: Payer: Self-pay | Admitting: Gastroenterology

## 2023-04-02 ENCOUNTER — Encounter: Payer: Self-pay | Admitting: *Deleted

## 2023-04-03 ENCOUNTER — Telehealth: Payer: Self-pay

## 2023-04-03 ENCOUNTER — Encounter: Payer: Self-pay | Admitting: Neurology

## 2023-04-03 ENCOUNTER — Ambulatory Visit: Payer: PPO | Admitting: Neurology

## 2023-04-03 ENCOUNTER — Other Ambulatory Visit: Payer: Self-pay | Admitting: Gastroenterology

## 2023-04-03 VITALS — BP 152/76 | HR 66 | Ht 66.0 in | Wt 137.2 lb

## 2023-04-03 DIAGNOSIS — K581 Irritable bowel syndrome with constipation: Secondary | ICD-10-CM

## 2023-04-03 DIAGNOSIS — R251 Tremor, unspecified: Secondary | ICD-10-CM | POA: Diagnosis not present

## 2023-04-03 DIAGNOSIS — T402X5A Adverse effect of other opioids, initial encounter: Secondary | ICD-10-CM

## 2023-04-03 MED ORDER — MOTEGRITY 2 MG PO TABS: 2.0000 mg | ORAL_TABLET | Freq: Every day | ORAL | 3 refills | Status: DC

## 2023-04-03 NOTE — Telephone Encounter (Signed)
Pt is requesting a refill on motegrity. Pt was last seen by Dr. Marletta Lor on 12/20/22.

## 2023-04-03 NOTE — Progress Notes (Signed)
Subjective:    Patient ID: Tanya Mueller, female    DOB: May 29, 1935, 88 y.o.   MRN: [id number]  HPI    Huston Foley, MD, PhD [facility] [address] [address] [place], [place] [address]  Dear Dr. Neita Carp,  I saw your patient, Tanya Mueller, upon your kind request in my neurologic clinic today for initial consultation of her tremors.  The patient is accompanied by her daughter today.  As you know, [name] is an 88 year old female with an underlying medical history of chronic kidney disease, hypertension, nephrolithiasis, osteoarthritis, osteoporosis, allergic rhinitis, atrial fibrillation, history of sepsis, low back pain with status post surgery, PACs, scoliosis, and lung nodule, who reports a several month history of hand tremors.  She notices it more in the right hand.  She reports that she had recently started noticing the tremor shortly before her visit with you.  She had started around not too long before, maybe 2 months prior to that and she is now off of amiodarone and feels that the tremor has actually improved.  She is not aware of any family history of tremors.  She tries to hydrate well, she drinks about 5 to 6 cups of water per day, drinks decaf coffee, usually 1 cup in the morning, she drinks a small serving of Coca-Cola almost daily.  She does not drink any alcohol.  She is a non-smoker.  She does take oxycodone for pain, she has significant back pain and used to see Dr. Channing Mutters, she has a history of scoliosis.  She takes oxycodone 4 times a day.  She lives with her husband.  She has low back pain which often radiates down her left leg.  I reviewed your office note from 01/22/2023. I reviewed blood test results in her epic chart.  Her TSH on 11/13/2022 was normal at 1.477.  This was done through [facility].  Magnesium was normal at 1.6.  She had a CMP on 03/12/2023 which showed sodium of 137, potassium 3.5, BUN 9, creatinine 0.78, alk phos 59, AST 27,  ALT 16.  Her Past Medical History Is Significant For: Past Medical History:  Diagnosis Date   Allergic rhinitis    Arthritis    Back ,hips   Cancer (HCC)    Complication of anesthesia    awareness during general anesthesia.   Dysrhythmia    A-Fib and PSVT   Fatigue    History of atrial fibrillation    episode 2013 converted with cardizem-- happened at time acute illness due to kidney stone --  resolved  no issues since   History of kidney stones    History of sepsis    urosepsis  2013 due to kidney stone   Hypertension    Left nephrolithiasis    Left ureteral calculus    Low back pain    Neuromuscular disorder (HCC)    PAC (premature atrial contraction)    Scoliosis    Solitary lung nodule    benign right middle lobe per CT   Tremor     Her Past Surgical History Is Significant For: Past Surgical History:  Procedure Laterality Date   BACK SURGERY  2015   rod and screws   CATARACT EXTRACTION W/ INTRAOCULAR LENS  IMPLANT, BILATERAL  10/02/2008   CYSTOSCOPY WITH BIOPSY N/A 04/08/2019   Procedure: CYSTOSCOPY WITH bladder BIOPSY, FULGURATION;  Surgeon: Jerilee Field, MD;  Location: [facility];  Service: Urology;  Laterality: N/A;   CYSTOSCOPY WITH  STENT PLACEMENT Left 09/15/2014   Procedure: CYSTOSCOPY WITH STENT PLACEMENT;  Surgeon: Doyne Keel, MD;  Location: [facility];  Service: Urology;  Laterality: Left;   CYSTOSCOPY/RETROGRADE/URETEROSCOPY Left 08/09/2014   Procedure: CYSTOSCOPY/RETROGRADE/LEFT STENT;  Surgeon: Valetta Fuller, MD;  Location: [facility];  Service: Urology;  Laterality: Left;   CYSTOSCOPY/URETEROSCOPY/HOLMIUM LASER/STENT PLACEMENT Left 09/15/2022   Procedure: CYSTOSCOPY LEFT URETEROSCOPY/STONE BASKET EXTRACTION;  Surgeon: Bjorn Pippin, MD;  Location: [facility];  Service: Urology;  Laterality: Left;  1 HR FOR CASE   EXTRACORPOREAL SHOCK WAVE LITHOTRIPSY  10/03/2011   HOLMIUM LASER APPLICATION Left 09/15/2014   Procedure: HOLMIUM LASER  APPLICATION;  Surgeon: Doyne Keel, MD;  Location: [facility];  Service: Urology;  Laterality: Left;   IR URETERAL STENT LEFT NEW ACCESS W/O SEP NEPHROSTOMY CATH  08/18/2022   LAPAROTOMY W/  UNILATERAL SALPINGOOPHORECTOMY  age 44   NEPHROLITHOTOMY Left 08/18/2022   Procedure: LEFT NEPHROLITHOTOMY PERCUTANEOUS;  Surgeon: Bjorn Pippin, MD;  Location: [facility];  Service: Urology;  Laterality: Left;  2.5 HRS FOR CASE   TOTAL ABDOMINAL HYSTERECTOMY  age 50   w/ unilateral salpingoophorectomy   TRANSTHORACIC ECHOCARDIOGRAM  02/13/2012   mild LVH/  ef 65%/  trivial MR  &  PR/  mild RVE   URETEROSCOPY Left 09/15/2014   Procedure: URETEROSCOPY;  Surgeon: Doyne Keel, MD;  Location: [facility];  Service: Urology;  Laterality: Left;    Her Family History Is Significant For: Family History  Problem Relation Age of Onset   Hypertension Mother    Cerebral aneurysm Father    Asthma Sister    Bladder Cancer Brother    Hypertension Brother    Bone cancer Brother    Cancer Other        Lung and bladder   Cancer Other        "Bone marrow cancer"    Her Social History Is Significant For: Social History   Socioeconomic History   Marital status: Married    Spouse name: Not on file   Number of children: 1   Years of education: Not on file   Highest education level: Not on file  Occupational History    Employer: RETIRED  Tobacco Use   Smoking status: Never    Passive exposure: Never   Smokeless tobacco: Never  Vaping Use   Vaping Use: Never used  Substance and Sexual Activity   Alcohol use: No   Drug use: No   Sexual activity: Not Currently  Other Topics Concern   Not on file  Social History Narrative   Not on file   Social Determinants of Health   Financial Resource Strain: Low Risk  (12/14/2022)   Overall Financial Resource Strain (CARDIA)    Difficulty of Paying Living Expenses: Not hard at all  Food Insecurity: No Food Insecurity  (12/14/2022)   Hunger Vital Sign    Worried About Running Out of Food in the Last Year: Never true    Ran Out of Food in the Last Year: Never true  Transportation Needs: No Transportation Needs (12/14/2022)   PRAPARE - Administrator, Civil Service (Medical): No    Lack of Transportation (Non-Medical): No  Physical Activity: Inactive (12/14/2022)   Exercise Vital Sign    Days of Exercise per Week: 0 days    Minutes of Exercise per Session: 0 min  Stress: No Stress Concern Present (12/14/2022)   [employer] of Occupational Health - Occupational  Stress Questionnaire    Feeling of Stress : Not at all  Social Connections: Socially Integrated (12/14/2022)   Social Connection and Isolation Panel [NHANES]    Frequency of Communication with Friends and Family: More than three times a week    Frequency of Social Gatherings with Friends and Family: More than three times a week    Attends Religious Services: 1 to 4 times per year    Active Member of [employer] or Organizations: Yes    Attends Banker Meetings: 1 to 4 times per year    Marital Status: Married    Her Allergies Are:  Allergies  Allergen Reactions   Morphine     Nervous  :   Her Current Medications Are:  Outpatient Encounter Medications as of 04/03/2023  Medication Sig   acetaminophen (TYLENOL) 325 MG tablet Take 2 tablets (650 mg total) by mouth every 6 (six) hours as needed for mild pain (or Fever >/= 101).   apixaban (ELIQUIS) 5 MG TABS tablet Take 1 tablet (5 mg total) by mouth 2 (two) times daily.   diltiazem (CARDIZEM) 60 MG tablet Take 1 tablet (60 mg total) by mouth every 8 (eight) hours as needed (palpitations).   fluticasone (FLONASE) 50 MCG/ACT nasal spray Place 2 sprays into the nose daily as needed for allergies.   furosemide (LASIX) 20 MG tablet Take 1 tablet (20 mg total) by mouth daily. May take an additional 20 mg daily as needed for leg swelling or shortness of breath   losartan (COZAAR)  50 MG tablet Take 1 tablet (50 mg total) by mouth daily.   Oxycodone HCl 10 MG TABS Take 10 mg by mouth 4 (four) times daily as needed.   oxyCODONE-acetaminophen (PERCOCET) 10-325 MG tablet Take 1 tablet by mouth 4 (four) times daily as needed for pain.   polyethylene glycol (MIRALAX / GLYCOLAX) 17 g packet Take 17 g by mouth daily as needed for moderate constipation.   potassium chloride (KLOR-CON M) 10 MEQ tablet Take 1 tablet (10 mEq total) by mouth daily.   Prucalopride Succinate (MOTEGRITY) 2 MG TABS Take 1 tablet (2 mg total) by mouth daily.   sertraline (ZOLOFT) 50 MG tablet Take 75 mg by mouth daily.   No facility-administered encounter medications on file as of 04/03/2023.  :   Review of Systems:  Out of a complete 14 point review of systems, all are reviewed and negative with the exception of these symptoms as listed below:   Review of Systems  Neurological:        Rm 9. Accompanied by daughter. NP/Paper Proficient/[facility]/Paul Sasser MD/tremors. C/o generalized shaking sensation, tremors noted on right hand. C/o left sided back, hip, and thigh pain today.       Objective:   Physical Exam    Physical Examination:   Vitals:   04/03/23 1408  BP: (!) 152/76  Pulse: 66    General Examination: The patient is a very pleasant 88 y.o. female in no acute distress. She appears well-developed and well-nourished and well groomed.   HEENT: Normocephalic, atraumatic, pupils are equal, round and reactive to light, extraocular tracking is good without limitation to gaze excursion or nystagmus noted.  Status post bilateral cataract repairs.  Hearing is grossly intact. Face is symmetric with normal facial animation. Speech is clear with no dysarthria noted. There is no hypophonia. There is no lip, neck/head, jaw or voice tremor. Neck is supple with full range of passive and active motion. There are no  carotid bruits on auscultation. Oropharynx exam reveals: mild mouth dryness,  adequate dental hygiene.  Tongue protrudes centrally and palate elevates symmetrically.   Chest: Clear to auscultation without wheezing, rhonchi or crackles noted.  Heart: S1+S2+0, regular and normal without murmurs, rubs or gallops noted.   Abdomen: Soft, non-tender and non-distended.  Extremities: There is no pitting edema in the distal lower extremities bilaterally.   Skin: Warm and dry without trophic changes noted.   Musculoskeletal: exam reveals low back pain, evidence of scoliosis, and equal hip heights.    Neurologically:  Mental status: The patient is awake, alert and oriented in all 4 spheres. Her immediate and remote memory, attention, language skills and fund of knowledge are appropriate. There is no evidence of aphasia, agnosia, apraxia or anomia. Speech is clear with normal prosody and enunciation. Thought process is linear. Mood is normal and affect is normal.  Cranial nerves II - XII are as described above under HEENT exam.  Motor exam: Normal bulk, strength and tone is noted. There is no obvious action or resting tremor.  Very slight postural tremor in both upper extremities. On Archimedes spiral drawing she has minimal trembling with both upper extremities, handwriting is legible, not micrographic, not particularly tremulous. Fine motor skills and coordination: Mild global difficulty with finger taps, hand movements, rapid alternating patting and foot taps, no decrement in amplitude, no lateralization noted.    Cerebellar testing: No dysmetria or intention tremor. There is no truncal or gait ataxia.  Sensory exam: intact to light touch in the upper and lower extremities.  Reflexes are 2+ in the upper extremities and 1+ in the lower extremities, toes are downgoing bilaterally. Gait, station and balance: She stands slowly, pushes herself up, she walks with a mild limp, she has a cane but can walk a few steps without the cane, no shuffling, preserved arm swing.     Assessment  & Plan:  In summary, GITANJALI MORMINO is a very pleasant 88 y.o.-year old female with an underlying medical history of chronic kidney disease, hypertension, nephrolithiasis, osteoarthritis, osteoporosis, allergic rhinitis, atrial fibrillation, history of sepsis, low back pain with status post surgery, PACs, scoliosis, and lung nodule, who presents for evaluation of her hand tremors of several months duration.  She noticed a tremor fairly shortly after starting amiodarone.  She has been off of amiodarone for the past few weeks and feels that the tremor is actually improved.  Examination is not in keeping with any significant tremor at this time, she does have a slight bilateral upper extremity postural tremor, no evidence of parkinsonism, history and physical exam not in keeping with essential tremor.  We talked about certain triggers including anxiety, suboptimal hydration, sleep deprivation, and certain medications that can cause or exacerbate tremors.  She is advised that even antidepressant medications can cause tremors but amiodarone can also be a medication that can cause trembling.  At this juncture, we mutually agreed not to start any new medications.  She is advised to be cautious with narcotic pain medication due to the possible side effects including balance issues.  She is advised to follow-up with you on this and the possibility of scaling back on narcotic pain medication, she reports ongoing low back pain and is encouraged to talk to about seeing a different spine specialist since her previous doctor retired.  I do not see a pressing reason to pursue a brain scan at this time such as a brain MRI or head CT.  We will follow-up  as needed in this clinic.  She is advised to follow-up with your office as scheduled/planned.  I answered all her questions today and the patient and her daughter were in agreement. Thank you very much for allowing me to participate in the care of this nice patient. If I can be of  any further assistance to you please do not hesitate to call me at [phone].  Sincerely,   Huston Foley, MD, PhD

## 2023-04-03 NOTE — Telephone Encounter (Signed)
Rx sent 

## 2023-04-03 NOTE — Patient Instructions (Signed)
You have a rather mild tremor of both hands, likely due to medication side effects.  Amiodarone can cause tremors and even sertraline.    I do not see any signs or symptoms of parkinson's like disease or what we call parkinsonism.  For your tremor, I would not recommend any new medications at this time.   Please remember, that any kind of tremor may be exacerbated by anxiety, anger, nervousness, excitement, dehydration, sleep deprivation, thyroid dysfunction, by caffeine, and low blood sugar values or blood sugar fluctuations. Some medications can exacerbate tremors, this includes certain asthma or COPD medications and certain antidepressants.   Please talk to your primary care about seeing another spine specialist, since your doctor retired.   I recommend reducing narcotic pain medications as much as possible.

## 2023-04-04 ENCOUNTER — Other Ambulatory Visit: Payer: Self-pay | Admitting: *Deleted

## 2023-04-04 ENCOUNTER — Other Ambulatory Visit: Payer: Self-pay

## 2023-04-04 DIAGNOSIS — K5903 Drug induced constipation: Secondary | ICD-10-CM

## 2023-04-04 DIAGNOSIS — K581 Irritable bowel syndrome with constipation: Secondary | ICD-10-CM

## 2023-04-04 MED ORDER — MOTEGRITY 2 MG PO TABS
2.0000 mg | ORAL_TABLET | Freq: Every day | ORAL | 3 refills | Status: DC
Start: 2023-04-04 — End: 2023-07-27

## 2023-04-04 NOTE — Progress Notes (Signed)
error 

## 2023-04-23 ENCOUNTER — Encounter: Payer: Self-pay | Admitting: Gastroenterology

## 2023-04-23 ENCOUNTER — Ambulatory Visit: Payer: PPO | Admitting: Gastroenterology

## 2023-04-23 VITALS — BP 133/68 | HR 72 | Temp 97.7°F | Ht 66.0 in | Wt 137.2 lb

## 2023-04-23 DIAGNOSIS — R1032 Left lower quadrant pain: Secondary | ICD-10-CM

## 2023-04-23 DIAGNOSIS — K5903 Drug induced constipation: Secondary | ICD-10-CM

## 2023-04-23 DIAGNOSIS — K581 Irritable bowel syndrome with constipation: Secondary | ICD-10-CM

## 2023-04-23 DIAGNOSIS — T402X5A Adverse effect of other opioids, initial encounter: Secondary | ICD-10-CM

## 2023-04-23 NOTE — Progress Notes (Signed)
GI Office Note    Referring Provider: Estanislado Pandy, MD Primary Care Physician:  Estanislado Pandy, MD Primary Gastroenterologist: Hennie Duos. Marletta Lor, DO  Date:  04/23/2023  ID:  Tanya Mueller, DOB [birth year], MRN [id number]   Chief Complaint   Chief Complaint  Patient presents with   Abdominal Pain    Lower left abdominal pain     History of Present Illness  Tanya Mueller is a 88 y.o. female with a history of opioid-induced constipation, LLQ pain, arthritis, HTN, kidney stones, A-fib presenting today for follow-up of left lower quadrant abdominal pain and constipation.  Regarding her constipation she has been on Movantik, Linzess, and Amitiza in the past.  Most recently has been on [employer].  Last office visit 12/20/2022.  Patient reported she was averaging 2-3 stools per day and usually going every 2-3 days without a bowel movement while Motegrity.  She was concerned regarding worsening left lower quadrant abdominal pain that started 1-2 weeks prior.  Pain is severe and will double her over at times when it does occur and sometimes radiates to her back.  Dr. Marletta Lor recommended adding MiraLAX to her Motegrity if needed and given left lower quadrant abdominal pain/tenderness she was advised to complete a CT scan of her abdomen and pelvis.  CT abdomen pelvis 12/26/2022: -Bilateral nonobstructing renal calculi -Uterus surgically absent -Stomach and small bowel loops unremarkable  ED visit 03/12/2023 for left flank pain.  She had CT renal stone study without evidence of hydronephrosis or ureteral stone.  Flank pain suspected to be secondary to sciatica.  UA with hematuria without evidence of infection.  CMP unremarkable.  Lipase unremarkable.   Today: Still having pain in her LLQ and left flank pain. States she had a kidney stone at that time. Hurts into her left groin as well. Not  a grabbing kidney stone pain. Feels tight into the left lower quadrant and will last a day then go away.  Recently has been daily for the last 3 days but prior to that just here and there. Most of the time pain improves after defecation.   Tacking motegrity. Sometimes goes once daily and if she does not go she takes miralax and then that usually helps. Sometimes she will go a couple days without a BM. At times she feels like she empties all the way and other times not. Miralax on average 1-3 times weekly.   No shortness of breath.   Current Outpatient Medications  Medication Sig Dispense Refill   acetaminophen (TYLENOL) 325 MG tablet Take 2 tablets (650 mg total) by mouth every 6 (six) hours as needed for mild pain (or Fever >/= 101). 30 tablet 0   apixaban (ELIQUIS) 5 MG TABS tablet Take 1 tablet (5 mg total) by mouth 2 (two) times daily.     diltiazem (CARDIZEM) 60 MG tablet Take 1 tablet (60 mg total) by mouth every 8 (eight) hours as needed (palpitations). 90 tablet 1   fluticasone (FLONASE) 50 MCG/ACT nasal spray Place 2 sprays into the nose daily as needed for allergies.     furosemide (LASIX) 20 MG tablet Take 1 tablet (20 mg total) by mouth daily. May take an additional 20 mg daily as needed for leg swelling or shortness of breath 60 tablet 6   losartan (COZAAR) 50 MG tablet Take 1 tablet (50 mg total) by mouth daily. 90 tablet 3   Oxycodone HCl 10 MG TABS Take 10 mg by mouth 4 (four) times daily  as needed.     oxyCODONE-acetaminophen (PERCOCET) 10-325 MG tablet Take 1 tablet by mouth 4 (four) times daily as needed for pain.     polyethylene glycol (MIRALAX / GLYCOLAX) 17 g packet Take 17 g by mouth daily as needed for moderate constipation.     potassium chloride (KLOR-CON M) 10 MEQ tablet Take 1 tablet (10 mEq total) by mouth daily. 90 tablet 1   Prucalopride Succinate (MOTEGRITY) 2 MG TABS Take 1 tablet (2 mg total) by mouth daily. 30 tablet 3   sertraline (ZOLOFT) 50 MG tablet Take 75 mg by mouth daily.     No current facility-administered medications for this visit.    Past Medical  History:  Diagnosis Date   Allergic rhinitis    Arthritis    Back ,hips   Cancer (HCC)    Complication of anesthesia    awareness during general anesthesia.   Dysrhythmia    A-Fib and PSVT   Fatigue    History of atrial fibrillation    episode 2013 converted with cardizem-- happened at time acute illness due to kidney stone --  resolved  no issues since   History of kidney stones    History of sepsis    urosepsis  2013 due to kidney stone   Hypertension    Left nephrolithiasis    Left ureteral calculus    Low back pain    Neuromuscular disorder (HCC)    PAC (premature atrial contraction)    Scoliosis    Solitary lung nodule    benign right middle lobe per CT   Tremor     Past Surgical History:  Procedure Laterality Date   BACK SURGERY  2015   rod and screws   CATARACT EXTRACTION W/ INTRAOCULAR LENS  IMPLANT, BILATERAL  10/02/2008   CYSTOSCOPY WITH BIOPSY N/A 04/08/2019   Procedure: CYSTOSCOPY WITH bladder BIOPSY, FULGURATION;  Surgeon: Jerilee Field, MD;  Location: [facility];  Service: Urology;  Laterality: N/A;   CYSTOSCOPY WITH STENT PLACEMENT Left 09/15/2014   Procedure: CYSTOSCOPY WITH STENT PLACEMENT;  Surgeon: Doyne Keel, MD;  Location: [facility];  Service: Urology;  Laterality: Left;   CYSTOSCOPY/RETROGRADE/URETEROSCOPY Left 08/09/2014   Procedure: CYSTOSCOPY/RETROGRADE/LEFT STENT;  Surgeon: Valetta Fuller, MD;  Location: [facility];  Service: Urology;  Laterality: Left;   CYSTOSCOPY/URETEROSCOPY/HOLMIUM LASER/STENT PLACEMENT Left 09/15/2022   Procedure: CYSTOSCOPY LEFT URETEROSCOPY/STONE BASKET EXTRACTION;  Surgeon: Bjorn Pippin, MD;  Location: [facility];  Service: Urology;  Laterality: Left;  1 HR FOR CASE   EXTRACORPOREAL SHOCK WAVE LITHOTRIPSY  10/03/2011   HOLMIUM LASER APPLICATION Left 09/15/2014   Procedure: HOLMIUM LASER APPLICATION;  Surgeon: Doyne Keel, MD;  Location: [facility];  Service: Urology;  Laterality:  Left;   IR URETERAL STENT LEFT NEW ACCESS W/O SEP NEPHROSTOMY CATH  08/18/2022   LAPAROTOMY W/  UNILATERAL SALPINGOOPHORECTOMY  age 31   NEPHROLITHOTOMY Left 08/18/2022   Procedure: LEFT NEPHROLITHOTOMY PERCUTANEOUS;  Surgeon: Bjorn Pippin, MD;  Location: [facility];  Service: Urology;  Laterality: Left;  2.5 HRS FOR CASE   TOTAL ABDOMINAL HYSTERECTOMY  age 57   w/ unilateral salpingoophorectomy   TRANSTHORACIC ECHOCARDIOGRAM  02/13/2012   mild LVH/  ef 65%/  trivial MR  &  PR/  mild RVE   URETEROSCOPY Left 09/15/2014   Procedure: URETEROSCOPY;  Surgeon: Doyne Keel, MD;  Location: [facility];  Service: Urology;  Laterality: Left;    Family History  Problem Relation Age of  Onset   Hypertension Mother    Cerebral aneurysm Father    Asthma Sister    Bladder Cancer Brother    Hypertension Brother    Bone cancer Brother    Cancer Other        Lung and bladder   Cancer Other        "Bone marrow cancer"    Allergies as of 04/23/2023 - Review Complete 04/23/2023  Allergen Reaction Noted   Morphine      Social History   Socioeconomic History   Marital status: Married    Spouse name: Not on file   Number of children: 1   Years of education: Not on file   Highest education level: Not on file  Occupational History    Employer: RETIRED  Tobacco Use   Smoking status: Never    Passive exposure: Never   Smokeless tobacco: Never  Vaping Use   Vaping status: Never Used  Substance and Sexual Activity   Alcohol use: No   Drug use: No   Sexual activity: Not Currently  Other Topics Concern   Not on file  Social History Narrative   Not on file   Social Determinants of Health   Financial Resource Strain: Low Risk  (12/14/2022)   Overall Financial Resource Strain (CARDIA)    Difficulty of Paying Living Expenses: Not hard at all  Food Insecurity: No Food Insecurity (12/14/2022)   Hunger Vital Sign    Worried About Running Out of Food in the Last Year: Never true     Ran Out of Food in the Last Year: Never true  Transportation Needs: No Transportation Needs (12/14/2022)   PRAPARE - Administrator, Civil Service (Medical): No    Lack of Transportation (Non-Medical): No  Physical Activity: Inactive (12/14/2022)   Exercise Vital Sign    Days of Exercise per Week: 0 days    Minutes of Exercise per Session: 0 min  Stress: No Stress Concern Present (12/14/2022)   [employer] of Occupational Health - Occupational Stress Questionnaire    Feeling of Stress : Not at all  Social Connections: Socially Integrated (12/14/2022)   Social Connection and Isolation Panel [NHANES]    Frequency of Communication with Friends and Family: More than three times a week    Frequency of Social Gatherings with Friends and Family: More than three times a week    Attends Religious Services: 1 to 4 times per year    Active Member of [employer] or Organizations: Yes    Attends Banker Meetings: 1 to 4 times per year    Marital Status: Married     Review of Systems   Gen: Denies fever, chills, anorexia. Denies fatigue, weakness, weight loss.  CV: Denies chest pain, palpitations, syncope, peripheral edema, and claudication. Resp: Denies dyspnea at rest, cough, wheezing, coughing up blood, and pleurisy. GI: See HPI Derm: Denies rash, itching, dry skin Psych: Denies depression, anxiety, memory loss, confusion. No homicidal or suicidal ideation.  Heme: Denies bruising, bleeding, and enlarged lymph nodes.   Physical Exam   BP 133/68 (BP Location: Left Arm, Patient Position: Sitting, Cuff Size: Normal)   Pulse 72   Temp 97.7 F (36.5 C) (Temporal)   Ht 5\' 6"  (1.676 m)   Wt 137 lb 3.2 oz (62.2 kg)   SpO2 95%   BMI 22.14 kg/m   General:   Alert and oriented. No distress noted. Pleasant and cooperative.  Head:  Normocephalic and atraumatic. Eyes:  Conjuctiva clear without scleral icterus. Mouth:  Oral mucosa pink and moist. Good dentition. No  lesions. Lungs:  Clear to auscultation bilaterally. No wheezes, rales, or rhonchi. No distress.  Heart:  S1, S2 present without murmurs appreciated.  Abdomen:  +BS, soft, non-distended. Ttp to umbilical region. No rebound or guarding. No HSM or masses noted. Rectal: deferred Msk:  Symmetrical without gross deformities. Normal posture. Extremities:  Without edema. Neurologic:  Alert and  oriented x4 Psych:  Alert and cooperative. Normal mood and affect.   Assessment  SAROYA RICCOBONO is a 88 y.o. female with a history of opioid-induced constipation presenting today with   IBS-C/OIC, LLQ pain: Patient has failed Amitiza, Linzess, and Movantik in the past.  Currently doing decent on Motegrity however still struggling intermittently with constipation.  Sometimes goes every day and then sometimes goes multiple days without a bowel movement.  When she experiences multiple days without a bowel movement she will take MiraLAX which she has averaging 1-3 times per week.  Her main concern is her left lower quadrant pain which she initially suspected to be secondary to kidney stones.  She experiences a cramping that extends into the left lower quadrant/left groin area that is occurring more frequently over the last few days.  Suspect her left lower quadrant pain is secondary to her constipation as her pain tends to get better with bowel movements and she appears to still be experiencing some incomplete emptying.  For now advised her to increase her MiraLAX from as needed to daily in an effort to have more regular bowel movements to improve her left lower quadrant abdominal pain.  Reassuringly her recent CT scans have been negative for any explanation of left lower quadrant abdominal pain.  PLAN   Continue Motegrity 2 mg daily MiraLAX 17 g once daily in addition to Motegrity.  Colonoscopy likely would be low yield to assess for cause of LLQ pain and constipation Follow up in 3 months  Brooke Bonito, MSN,  FNP-BC, AGACNP-BC [facility]

## 2023-04-23 NOTE — Patient Instructions (Signed)
Continue taking Motegrity 2 mg once daily.  Instead of taking MiraLAX just as needed I want you to begin taking this once daily.  You can take this in the morning or in the evening, which ever works best for you.  If you are having significant amounts of looser stools related to this then I want you to take a half a capful daily instead.  Please not hesitate to reach out if you have any questions or concerns or symptoms worsen.  We will plan to follow-up in 3 months, sooner if needed.  It was a pleasure to see you today. I want to create trusting relationships with patients. If you receive a survey regarding your visit,  I greatly appreciate you taking time to fill this out on paper or through your MyChart. I value your feedback.  Brooke Bonito, MSN, FNP-BC, AGACNP-BC [facility]

## 2023-04-24 DIAGNOSIS — R319 Hematuria, unspecified: Secondary | ICD-10-CM | POA: Diagnosis not present

## 2023-04-24 DIAGNOSIS — J0101 Acute recurrent maxillary sinusitis: Secondary | ICD-10-CM | POA: Diagnosis not present

## 2023-04-24 DIAGNOSIS — Z6821 Body mass index (BMI) 21.0-21.9, adult: Secondary | ICD-10-CM | POA: Diagnosis not present

## 2023-04-24 DIAGNOSIS — R109 Unspecified abdominal pain: Secondary | ICD-10-CM | POA: Diagnosis not present

## 2023-04-24 DIAGNOSIS — R300 Dysuria: Secondary | ICD-10-CM | POA: Diagnosis not present

## 2023-04-26 ENCOUNTER — Ambulatory Visit: Payer: PPO | Admitting: Neurology

## 2023-05-02 DIAGNOSIS — R5383 Other fatigue: Secondary | ICD-10-CM | POA: Diagnosis not present

## 2023-05-02 DIAGNOSIS — E7801 Familial hypercholesterolemia: Secondary | ICD-10-CM | POA: Diagnosis not present

## 2023-05-02 DIAGNOSIS — E782 Mixed hyperlipidemia: Secondary | ICD-10-CM | POA: Diagnosis not present

## 2023-05-02 DIAGNOSIS — D519 Vitamin B12 deficiency anemia, unspecified: Secondary | ICD-10-CM | POA: Diagnosis not present

## 2023-05-02 DIAGNOSIS — E876 Hypokalemia: Secondary | ICD-10-CM | POA: Diagnosis not present

## 2023-05-02 DIAGNOSIS — I10 Essential (primary) hypertension: Secondary | ICD-10-CM | POA: Diagnosis not present

## 2023-05-02 DIAGNOSIS — Z1329 Encounter for screening for other suspected endocrine disorder: Secondary | ICD-10-CM | POA: Diagnosis not present

## 2023-05-02 DIAGNOSIS — E7800 Pure hypercholesterolemia, unspecified: Secondary | ICD-10-CM | POA: Diagnosis not present

## 2023-05-02 DIAGNOSIS — D649 Anemia, unspecified: Secondary | ICD-10-CM | POA: Diagnosis not present

## 2023-05-02 DIAGNOSIS — E7849 Other hyperlipidemia: Secondary | ICD-10-CM | POA: Diagnosis not present

## 2023-05-02 DIAGNOSIS — N183 Chronic kidney disease, stage 3 unspecified: Secondary | ICD-10-CM | POA: Diagnosis not present

## 2023-05-03 ENCOUNTER — Ambulatory Visit (HOSPITAL_COMMUNITY)
Admission: RE | Admit: 2023-05-03 | Discharge: 2023-05-03 | Disposition: A | Discharge status: Home / Self Care | Payer: PPO | Source: Ambulatory Visit | Attending: Internal Medicine | Admitting: Internal Medicine

## 2023-05-03 DIAGNOSIS — I517 Cardiomegaly: Secondary | ICD-10-CM

## 2023-05-03 LAB — ECHOCARDIOGRAM LIMITED: S' Lateral: 2.9 cm

## 2023-05-03 NOTE — Progress Notes (Signed)
*  PRELIMINARY RESULTS* Echocardiogram Limited 2-D Echocardiogram  has been performed.  Stacey Drain 05/03/2023, 1:44 PM

## 2023-05-09 DIAGNOSIS — D7589 Other specified diseases of blood and blood-forming organs: Secondary | ICD-10-CM | POA: Diagnosis not present

## 2023-05-09 DIAGNOSIS — R5383 Other fatigue: Secondary | ICD-10-CM | POA: Diagnosis not present

## 2023-05-09 DIAGNOSIS — I10 Essential (primary) hypertension: Secondary | ICD-10-CM | POA: Diagnosis not present

## 2023-05-09 DIAGNOSIS — R4582 Worries: Secondary | ICD-10-CM | POA: Diagnosis not present

## 2023-05-09 DIAGNOSIS — N1831 Chronic kidney disease, stage 3a: Secondary | ICD-10-CM | POA: Diagnosis not present

## 2023-05-09 DIAGNOSIS — E1129 Type 2 diabetes mellitus with other diabetic kidney complication: Secondary | ICD-10-CM | POA: Diagnosis not present

## 2023-05-09 DIAGNOSIS — R300 Dysuria: Secondary | ICD-10-CM | POA: Diagnosis not present

## 2023-05-09 DIAGNOSIS — M5432 Sciatica, left side: Secondary | ICD-10-CM | POA: Diagnosis not present

## 2023-05-09 DIAGNOSIS — M1990 Unspecified osteoarthritis, unspecified site: Secondary | ICD-10-CM | POA: Diagnosis not present

## 2023-05-09 DIAGNOSIS — M48062 Spinal stenosis, lumbar region with neurogenic claudication: Secondary | ICD-10-CM | POA: Diagnosis not present

## 2023-05-09 DIAGNOSIS — R609 Edema, unspecified: Secondary | ICD-10-CM | POA: Diagnosis not present

## 2023-05-09 DIAGNOSIS — R002 Palpitations: Secondary | ICD-10-CM | POA: Diagnosis not present

## 2023-05-31 ENCOUNTER — Ambulatory Visit: Payer: PPO | Admitting: Urology

## 2023-06-03 DIAGNOSIS — Z6822 Body mass index (BMI) 22.0-22.9, adult: Secondary | ICD-10-CM | POA: Diagnosis not present

## 2023-06-03 DIAGNOSIS — R0981 Nasal congestion: Secondary | ICD-10-CM | POA: Diagnosis not present

## 2023-06-03 DIAGNOSIS — Z20822 Contact with and (suspected) exposure to covid-19: Secondary | ICD-10-CM | POA: Diagnosis not present

## 2023-06-03 DIAGNOSIS — U071 COVID-19: Secondary | ICD-10-CM | POA: Diagnosis not present

## 2023-06-03 DIAGNOSIS — R030 Elevated blood-pressure reading, without diagnosis of hypertension: Secondary | ICD-10-CM | POA: Diagnosis not present

## 2023-06-03 DIAGNOSIS — R059 Cough, unspecified: Secondary | ICD-10-CM | POA: Diagnosis not present

## 2023-06-11 ENCOUNTER — Ambulatory Visit: Payer: PPO | Admitting: Internal Medicine

## 2023-07-09 ENCOUNTER — Emergency Department (HOSPITAL_COMMUNITY): Payer: PPO

## 2023-07-09 ENCOUNTER — Inpatient Hospital Stay (HOSPITAL_COMMUNITY): Payer: PPO

## 2023-07-09 ENCOUNTER — Encounter (HOSPITAL_COMMUNITY): Payer: Self-pay

## 2023-07-09 ENCOUNTER — Other Ambulatory Visit: Payer: Self-pay

## 2023-07-09 ENCOUNTER — Inpatient Hospital Stay (HOSPITAL_COMMUNITY)
Admission: EM | Admit: 2023-07-09 | Discharge: 2023-07-12 | DRG: 481 | Disposition: A | Discharge status: Skilled Nursing Facility | Payer: PPO | Attending: Internal Medicine | Admitting: Internal Medicine

## 2023-07-09 DIAGNOSIS — Z981 Arthrodesis status: Secondary | ICD-10-CM

## 2023-07-09 DIAGNOSIS — F32A Depression, unspecified: Secondary | ICD-10-CM | POA: Diagnosis not present

## 2023-07-09 DIAGNOSIS — Z90710 Acquired absence of both cervix and uterus: Secondary | ICD-10-CM | POA: Diagnosis not present

## 2023-07-09 DIAGNOSIS — Z7901 Long term (current) use of anticoagulants: Secondary | ICD-10-CM

## 2023-07-09 DIAGNOSIS — S72051A Unspecified fracture of head of right femur, initial encounter for closed fracture: Secondary | ICD-10-CM | POA: Diagnosis not present

## 2023-07-09 DIAGNOSIS — Z885 Allergy status to narcotic agent status: Secondary | ICD-10-CM | POA: Diagnosis not present

## 2023-07-09 DIAGNOSIS — S72011A Unspecified intracapsular fracture of right femur, initial encounter for closed fracture: Secondary | ICD-10-CM | POA: Diagnosis not present

## 2023-07-09 DIAGNOSIS — M62562 Muscle wasting and atrophy, not elsewhere classified, left lower leg: Secondary | ICD-10-CM | POA: Diagnosis not present

## 2023-07-09 DIAGNOSIS — I10 Essential (primary) hypertension: Secondary | ICD-10-CM | POA: Diagnosis present

## 2023-07-09 DIAGNOSIS — T402X5A Adverse effect of other opioids, initial encounter: Secondary | ICD-10-CM | POA: Diagnosis present

## 2023-07-09 DIAGNOSIS — I480 Paroxysmal atrial fibrillation: Secondary | ICD-10-CM | POA: Diagnosis not present

## 2023-07-09 DIAGNOSIS — I110 Hypertensive heart disease with heart failure: Secondary | ICD-10-CM | POA: Diagnosis present

## 2023-07-09 DIAGNOSIS — Z79899 Other long term (current) drug therapy: Secondary | ICD-10-CM

## 2023-07-09 DIAGNOSIS — K5903 Drug induced constipation: Secondary | ICD-10-CM | POA: Diagnosis present

## 2023-07-09 DIAGNOSIS — M4186 Other forms of scoliosis, lumbar region: Secondary | ICD-10-CM | POA: Diagnosis present

## 2023-07-09 DIAGNOSIS — Z87442 Personal history of urinary calculi: Secondary | ICD-10-CM | POA: Diagnosis not present

## 2023-07-09 DIAGNOSIS — I5032 Chronic diastolic (congestive) heart failure: Secondary | ICD-10-CM | POA: Diagnosis present

## 2023-07-09 DIAGNOSIS — Z825 Family history of asthma and other chronic lower respiratory diseases: Secondary | ICD-10-CM | POA: Diagnosis not present

## 2023-07-09 DIAGNOSIS — Z8249 Family history of ischemic heart disease and other diseases of the circulatory system: Secondary | ICD-10-CM | POA: Diagnosis not present

## 2023-07-09 DIAGNOSIS — S72011D Unspecified intracapsular fracture of right femur, subsequent encounter for closed fracture with routine healing: Secondary | ICD-10-CM | POA: Diagnosis not present

## 2023-07-09 DIAGNOSIS — M11251 Other chondrocalcinosis, right hip: Secondary | ICD-10-CM | POA: Diagnosis not present

## 2023-07-09 DIAGNOSIS — M25572 Pain in left ankle and joints of left foot: Secondary | ICD-10-CM | POA: Diagnosis not present

## 2023-07-09 DIAGNOSIS — I491 Atrial premature depolarization: Secondary | ICD-10-CM | POA: Diagnosis not present

## 2023-07-09 DIAGNOSIS — W010XXA Fall on same level from slipping, tripping and stumbling without subsequent striking against object, initial encounter: Secondary | ICD-10-CM | POA: Diagnosis present

## 2023-07-09 DIAGNOSIS — Z043 Encounter for examination and observation following other accident: Secondary | ICD-10-CM | POA: Diagnosis not present

## 2023-07-09 DIAGNOSIS — D72829 Elevated white blood cell count, unspecified: Secondary | ICD-10-CM | POA: Diagnosis not present

## 2023-07-09 DIAGNOSIS — M62561 Muscle wasting and atrophy, not elsewhere classified, right lower leg: Secondary | ICD-10-CM | POA: Diagnosis not present

## 2023-07-09 DIAGNOSIS — W19XXXA Unspecified fall, initial encounter: Secondary | ICD-10-CM | POA: Diagnosis not present

## 2023-07-09 DIAGNOSIS — S72001A Fracture of unspecified part of neck of right femur, initial encounter for closed fracture: Secondary | ICD-10-CM | POA: Diagnosis not present

## 2023-07-09 DIAGNOSIS — Z8052 Family history of malignant neoplasm of bladder: Secondary | ICD-10-CM

## 2023-07-09 DIAGNOSIS — D62 Acute posthemorrhagic anemia: Secondary | ICD-10-CM | POA: Diagnosis not present

## 2023-07-09 DIAGNOSIS — R079 Chest pain, unspecified: Secondary | ICD-10-CM | POA: Diagnosis not present

## 2023-07-09 DIAGNOSIS — G8929 Other chronic pain: Secondary | ICD-10-CM | POA: Diagnosis present

## 2023-07-09 DIAGNOSIS — R2689 Other abnormalities of gait and mobility: Secondary | ICD-10-CM | POA: Diagnosis not present

## 2023-07-09 DIAGNOSIS — F1120 Opioid dependence, uncomplicated: Secondary | ICD-10-CM | POA: Diagnosis not present

## 2023-07-09 DIAGNOSIS — M419 Scoliosis, unspecified: Secondary | ICD-10-CM | POA: Diagnosis not present

## 2023-07-09 DIAGNOSIS — G894 Chronic pain syndrome: Secondary | ICD-10-CM | POA: Diagnosis not present

## 2023-07-09 DIAGNOSIS — M1611 Unilateral primary osteoarthritis, right hip: Secondary | ICD-10-CM | POA: Diagnosis not present

## 2023-07-09 DIAGNOSIS — M8580 Other specified disorders of bone density and structure, unspecified site: Secondary | ICD-10-CM | POA: Diagnosis not present

## 2023-07-09 DIAGNOSIS — M25551 Pain in right hip: Secondary | ICD-10-CM | POA: Diagnosis not present

## 2023-07-09 DIAGNOSIS — Z9181 History of falling: Secondary | ICD-10-CM | POA: Diagnosis not present

## 2023-07-09 DIAGNOSIS — S199XXA Unspecified injury of neck, initial encounter: Secondary | ICD-10-CM | POA: Diagnosis not present

## 2023-07-09 DIAGNOSIS — M549 Dorsalgia, unspecified: Secondary | ICD-10-CM | POA: Diagnosis not present

## 2023-07-09 DIAGNOSIS — F1110 Opioid abuse, uncomplicated: Secondary | ICD-10-CM | POA: Diagnosis not present

## 2023-07-09 DIAGNOSIS — M159 Polyosteoarthritis, unspecified: Secondary | ICD-10-CM | POA: Diagnosis present

## 2023-07-09 DIAGNOSIS — S0990XA Unspecified injury of head, initial encounter: Secondary | ICD-10-CM | POA: Diagnosis not present

## 2023-07-09 DIAGNOSIS — I5030 Unspecified diastolic (congestive) heart failure: Secondary | ICD-10-CM | POA: Diagnosis present

## 2023-07-09 DIAGNOSIS — I517 Cardiomegaly: Secondary | ICD-10-CM | POA: Diagnosis not present

## 2023-07-09 DIAGNOSIS — I4891 Unspecified atrial fibrillation: Secondary | ICD-10-CM | POA: Diagnosis not present

## 2023-07-09 LAB — URINALYSIS, ROUTINE W REFLEX MICROSCOPIC
Bacteria, UA: NONE SEEN
Bilirubin Urine: NEGATIVE
Glucose, UA: NEGATIVE mg/dL
Ketones, ur: NEGATIVE mg/dL
Leukocytes,Ua: NEGATIVE
Nitrite: NEGATIVE
Protein, ur: NEGATIVE mg/dL
Specific Gravity, Urine: 1.002 — ABNORMAL LOW (ref 1.005–1.030)
pH: 7 (ref 5.0–8.0)

## 2023-07-09 LAB — CBC WITH DIFFERENTIAL/PLATELET
Abs Immature Granulocytes: 0.04 10*3/uL (ref 0.00–0.07)
Basophils Absolute: 0.1 10*3/uL (ref 0.0–0.1)
Basophils Relative: 1 %
Eosinophils Absolute: 0.1 10*3/uL (ref 0.0–0.5)
Eosinophils Relative: 1 %
HCT: 38.9 % (ref 36.0–46.0)
Hemoglobin: 13.2 g/dL (ref 12.0–15.0)
Immature Granulocytes: 0 %
Lymphocytes Relative: 10 %
Lymphs Abs: 1.2 10*3/uL (ref 0.7–4.0)
MCH: 35 pg — ABNORMAL HIGH (ref 26.0–34.0)
MCHC: 33.9 g/dL (ref 30.0–36.0)
MCV: 103.2 fL — ABNORMAL HIGH (ref 80.0–100.0)
Monocytes Absolute: 0.7 10*3/uL (ref 0.1–1.0)
Monocytes Relative: 6 %
Neutro Abs: 9.5 10*3/uL — ABNORMAL HIGH (ref 1.7–7.7)
Neutrophils Relative %: 82 %
Platelets: 317 10*3/uL (ref 150–400)
RBC: 3.77 MIL/uL — ABNORMAL LOW (ref 3.87–5.11)
RDW: 12.6 % (ref 11.5–15.5)
WBC: 11.7 10*3/uL — ABNORMAL HIGH (ref 4.0–10.5)
nRBC: 0 % (ref 0.0–0.2)

## 2023-07-09 LAB — BASIC METABOLIC PANEL
Anion gap: 12 (ref 5–15)
BUN: 10 mg/dL (ref 8–23)
CO2: 26 mmol/L (ref 22–32)
Calcium: 9.1 mg/dL (ref 8.9–10.3)
Chloride: 103 mmol/L (ref 98–111)
Creatinine, Ser: 0.66 mg/dL (ref 0.44–1.00)
GFR, Estimated: >60 mL/min (ref >60)
Glucose, Bld: 122 mg/dL — ABNORMAL HIGH (ref 70–99)
Potassium: 3.6 mmol/L (ref 3.5–5.1)
Sodium: 141 mmol/L (ref 135–145)

## 2023-07-09 LAB — SURGICAL PCR SCREEN
MRSA, PCR: NEGATIVE
Staphylococcus aureus: NEGATIVE

## 2023-07-09 MED ORDER — BISACODYL 10 MG RE SUPP: 10.0000 mg | Freq: Every day | RECTAL | Status: DC | PRN

## 2023-07-09 MED ORDER — ACETAMINOPHEN 650 MG RE SUPP: 650.0000 mg | Freq: Four times a day (QID) | RECTAL | Status: DC | PRN

## 2023-07-09 MED ORDER — DILTIAZEM HCL 30 MG PO TABS: 60.0000 mg | ORAL_TABLET | Freq: Three times a day (TID) | ORAL | Status: DC | PRN

## 2023-07-09 MED ORDER — HYDROMORPHONE HCL 1 MG/ML IJ SOLN
0.5000 mg | Freq: Once | INTRAMUSCULAR | Status: AC
  Administered 2023-07-09: 0.5 mg via INTRAVENOUS
  Filled 2023-07-09: qty 0.5

## 2023-07-09 MED ORDER — LOSARTAN POTASSIUM 50 MG PO TABS
50.0000 mg | ORAL_TABLET | Freq: Every day | ORAL | Status: DC
  Administered 2023-07-09 – 2023-07-12 (×4): 50 mg via ORAL
  Filled 2023-07-09 (×2): qty 1
  Filled 2023-07-09: qty 2
  Filled 2023-07-09: qty 1

## 2023-07-09 MED ORDER — OXYCODONE HCL 5 MG PO TABS
15.0000 mg | ORAL_TABLET | Freq: Four times a day (QID) | ORAL | Status: DC | PRN
  Administered 2023-07-10 (×2): 15 mg via ORAL
  Filled 2023-07-09 (×2): qty 3

## 2023-07-09 MED ORDER — POLYETHYLENE GLYCOL 3350 17 G PO PACK: 17.0000 g | PACK | Freq: Every day | ORAL | Status: DC | PRN

## 2023-07-09 MED ORDER — SODIUM CHLORIDE 0.9% FLUSH: 3.0000 mL | INTRAVENOUS | Status: DC | PRN

## 2023-07-09 MED ORDER — OXYCODONE HCL 5 MG PO TABS
10.0000 mg | ORAL_TABLET | Freq: Four times a day (QID) | ORAL | Status: DC | PRN
  Administered 2023-07-09: 10 mg via ORAL
  Filled 2023-07-09: qty 2

## 2023-07-09 MED ORDER — LABETALOL HCL 5 MG/ML IV SOLN: 10.0000 mg | INTRAVENOUS | Status: DC | PRN

## 2023-07-09 MED ORDER — FENTANYL CITRATE PF 50 MCG/ML IJ SOSY
50.0000 ug | PREFILLED_SYRINGE | Freq: Once | INTRAMUSCULAR | Status: AC
  Administered 2023-07-09: 50 ug via INTRAVENOUS
  Filled 2023-07-09: qty 1

## 2023-07-09 MED ORDER — SODIUM CHLORIDE 0.9% FLUSH
3.0000 mL | Freq: Two times a day (BID) | INTRAVENOUS | Status: DC
  Administered 2023-07-09 – 2023-07-12 (×5): 3 mL via INTRAVENOUS

## 2023-07-09 MED ORDER — SODIUM CHLORIDE 0.9 % IV SOLN: INTRAVENOUS | Status: DC | PRN

## 2023-07-09 MED ORDER — POLYETHYLENE GLYCOL 3350 17 G PO PACK: 17.0000 g | PACK | Freq: Every day | ORAL | Status: DC

## 2023-07-09 MED ORDER — SODIUM CHLORIDE 0.9% FLUSH
3.0000 mL | Freq: Two times a day (BID) | INTRAVENOUS | Status: DC
  Administered 2023-07-09 – 2023-07-12 (×4): 3 mL via INTRAVENOUS

## 2023-07-09 MED ORDER — TRAZODONE HCL 50 MG PO TABS: 50.0000 mg | ORAL_TABLET | Freq: Every evening | ORAL | Status: DC | PRN

## 2023-07-09 MED ORDER — MUPIROCIN 2 % EX OINT
1.0000 | TOPICAL_OINTMENT | Freq: Two times a day (BID) | CUTANEOUS | Status: DC
  Administered 2023-07-10: 1 via NASAL
  Filled 2023-07-09: qty 22

## 2023-07-09 MED ORDER — HYDROMORPHONE HCL 1 MG/ML IJ SOLN
0.5000 mg | INTRAMUSCULAR | Status: DC | PRN
  Administered 2023-07-09 – 2023-07-10 (×5): 0.5 mg via INTRAVENOUS
  Filled 2023-07-09 (×5): qty 0.5

## 2023-07-09 MED ORDER — SERTRALINE HCL 50 MG PO TABS
75.0000 mg | ORAL_TABLET | Freq: Every day | ORAL | Status: DC
  Administered 2023-07-09 – 2023-07-12 (×4): 75 mg via ORAL
  Filled 2023-07-09 (×4): qty 2

## 2023-07-09 MED ORDER — HEPARIN SODIUM (PORCINE) 5000 UNIT/ML IJ SOLN
5000.0000 [IU] | Freq: Three times a day (TID) | INTRAMUSCULAR | Status: DC
  Administered 2023-07-09: 5000 [IU] via SUBCUTANEOUS
  Filled 2023-07-09: qty 1

## 2023-07-09 MED ORDER — POLYETHYLENE GLYCOL 3350 17 G PO PACK
17.0000 g | PACK | Freq: Two times a day (BID) | ORAL | Status: DC
  Administered 2023-07-09 – 2023-07-12 (×5): 17 g via ORAL
  Filled 2023-07-09 (×5): qty 1

## 2023-07-09 MED ORDER — METHOCARBAMOL 500 MG PO TABS
500.0000 mg | ORAL_TABLET | Freq: Once | ORAL | Status: AC
  Administered 2023-07-09: 500 mg via ORAL
  Filled 2023-07-09: qty 1

## 2023-07-09 MED ORDER — ACETAMINOPHEN 325 MG PO TABS: 650.0000 mg | ORAL_TABLET | Freq: Four times a day (QID) | ORAL | Status: DC | PRN

## 2023-07-09 MED ORDER — HYDROMORPHONE HCL 1 MG/ML IJ SOLN
1.0000 mg | Freq: Once | INTRAMUSCULAR | Status: AC
  Administered 2023-07-09: 1 mg via INTRAVENOUS
  Filled 2023-07-09: qty 1

## 2023-07-09 MED ORDER — METHOCARBAMOL 500 MG PO TABS
750.0000 mg | ORAL_TABLET | Freq: Three times a day (TID) | ORAL | Status: DC
  Administered 2023-07-09 – 2023-07-12 (×7): 750 mg via ORAL
  Filled 2023-07-09 (×8): qty 2

## 2023-07-09 NOTE — ED Notes (Signed)
ED TO INPATIENT HANDOFF REPORT  ED Nurse Name and Phone #: Jalen Daluz  S Name/Age/Gender Tanya Mueller 88 y.o. female Room/Bed: APA03/APA03  Code Status   Code Status: Full Code  Home/SNF/Other Rehab Patient oriented to: self, place, time, and situation Is this baseline? Yes   Triage Complete: Triage complete  Chief Complaint Hip pain, acute, right [M25.551]  Triage Note Pt brought in by [facility] for c/o fall around 6 am this morning. Denies LOC or hitting head, takes Eliqius.   EMS states when they arrived she was sitting on the bedside commode. Pt c/o shooting pain down right leg and back pain 8/10, hx back surgeries. EMS: Fentanyl 50 mcg, 20 L AC, initial BP 200/97 then 170/80 at recheck. Pt alert and in NAD during triage.   Allergies Allergies  Allergen Reactions   Morphine     Nervous    Level of Care/Admitting Diagnosis ED Disposition     ED Disposition  Admit   Condition  --   Comment  Hospital Area: [facility] [[id number]]  Level of Care: Med-Surg [16]  Covid Evaluation: Asymptomatic - no recent exposure (last 10 days) testing not required  Diagnosis: Hip pain, acute, right [409811]  Admitting Physician: Marylyn Ishihara  Attending Physician: Marylyn Ishihara  Certification:: I certify this patient will need inpatient services for at least 2 midnights  Expected Medical Readiness: 07/12/2023          B Medical/Surgery History Past Medical History:  Diagnosis Date   Allergic rhinitis    Arthritis    Back ,hips   Cancer (HCC)    Complication of anesthesia    awareness during general anesthesia.   Dysrhythmia    A-Fib and PSVT   Fatigue    History of atrial fibrillation    episode 2013 converted with cardizem-- happened at time acute illness due to kidney stone --  resolved  no issues since   History of kidney stones    History of sepsis    urosepsis  2013 due to kidney stone   Hypertension    Left nephrolithiasis     Left ureteral calculus    Low back pain    Neuromuscular disorder (HCC)    PAC (premature atrial contraction)    Scoliosis    Solitary lung nodule    benign right middle lobe per CT   Tremor    Past Surgical History:  Procedure Laterality Date   BACK SURGERY  2015   rod and screws   CATARACT EXTRACTION W/ INTRAOCULAR LENS  IMPLANT, BILATERAL  10/02/2008   CYSTOSCOPY WITH BIOPSY N/A 04/08/2019   Procedure: CYSTOSCOPY WITH bladder BIOPSY, FULGURATION;  Surgeon: Jerilee Field, MD;  Location: [facility];  Service: Urology;  Laterality: N/A;   CYSTOSCOPY WITH STENT PLACEMENT Left 09/15/2014   Procedure: CYSTOSCOPY WITH STENT PLACEMENT;  Surgeon: Doyne Keel, MD;  Location: [facility];  Service: Urology;  Laterality: Left;   CYSTOSCOPY/RETROGRADE/URETEROSCOPY Left 08/09/2014   Procedure: CYSTOSCOPY/RETROGRADE/LEFT STENT;  Surgeon: Valetta Fuller, MD;  Location: [facility];  Service: Urology;  Laterality: Left;   CYSTOSCOPY/URETEROSCOPY/HOLMIUM LASER/STENT PLACEMENT Left 09/15/2022   Procedure: CYSTOSCOPY LEFT URETEROSCOPY/STONE BASKET EXTRACTION;  Surgeon: Bjorn Pippin, MD;  Location: [facility];  Service: Urology;  Laterality: Left;  1 HR FOR CASE   EXTRACORPOREAL SHOCK WAVE LITHOTRIPSY  10/03/2011   HOLMIUM LASER APPLICATION Left 09/15/2014   Procedure: HOLMIUM LASER APPLICATION;  Surgeon: Doyne Keel, MD;  Location: [facility];  Service: Urology;  Laterality: Left;   IR URETERAL STENT LEFT NEW ACCESS W/O SEP NEPHROSTOMY CATH  08/18/2022   LAPAROTOMY W/  UNILATERAL SALPINGOOPHORECTOMY  age 29   NEPHROLITHOTOMY Left 08/18/2022   Procedure: LEFT NEPHROLITHOTOMY PERCUTANEOUS;  Surgeon: Bjorn Pippin, MD;  Location: [facility];  Service: Urology;  Laterality: Left;  2.5 HRS FOR CASE   TOTAL ABDOMINAL HYSTERECTOMY  age 10   w/ unilateral salpingoophorectomy   TRANSTHORACIC ECHOCARDIOGRAM  02/13/2012   mild LVH/  ef 65%/  trivial MR  &  PR/  mild RVE    URETEROSCOPY Left 09/15/2014   Procedure: URETEROSCOPY;  Surgeon: Doyne Keel, MD;  Location[facility];  Service: Urology;  Laterality: Left;     A IV Location/Drains/Wounds Patient Lines/Drains/Airways Status     Active Line/Drains/Airways     Name Placement date Placement time Site Days   Peripheral IV 07/09/23 20 G Left Antecubital 07/09/23  1000  Antecubital  less than 1   External Urinary Catheter 07/09/23  1200  --  less than 1            Intake/Output Last 24 hours No intake or output data in the 24 hours ending 07/09/23 1642  Labs/Imaging Results for orders placed or performed during the hospital encounter of 07/09/23 (from the past 48 hour(s))  Urinalysis, Routine w reflex microscopic -Urine, Clean Catch     Status: Abnormal   Collection Time: 07/09/23 10:20 AM  Result Value Ref Range   Color, Urine COLORLESS (A) YELLOW   APPearance CLEAR CLEAR   Specific Gravity, Urine 1.002 (L) 1.005 - 1.030   pH 7.0 5.0 - 8.0   Glucose, UA NEGATIVE NEGATIVE mg/dL   Hgb urine dipstick MODERATE (A) NEGATIVE   Bilirubin Urine NEGATIVE NEGATIVE   Ketones, ur NEGATIVE NEGATIVE mg/dL   Protein, ur NEGATIVE NEGATIVE mg/dL   Nitrite NEGATIVE NEGATIVE   Leukocytes,Ua NEGATIVE NEGATIVE   RBC / HPF 0-5 0 - 5 RBC/hpf   WBC, UA 0-5 0 - 5 WBC/hpf   Bacteria, UA NONE SEEN NONE SEEN   Squamous Epithelial / HPF 0-5 0 - 5 /HPF    Comment: Performed at [facility], [address] [place], [place] [address]  CBC with Differential     Status: Abnormal   Collection Time: 07/09/23 12:48 PM  Result Value Ref Range   WBC 11.7 (H) 4.0 - 10.5 K/uL   RBC 3.77 (L) 3.87 - 5.11 MIL/uL   Hemoglobin 13.2 12.0 - 15.0 g/dL   HCT 60.4 54.0 - 98.1 %   MCV 103.2 (H) 80.0 - 100.0 fL   MCH 35.0 (H) 26.0 - 34.0 pg   MCHC 33.9 30.0 - 36.0 g/dL   RDW 19.1 47.8 - 29.5 %   Platelets 317 150 - 400 K/uL   nRBC 0.0 0.0 - 0.2 %   Neutrophils Relative % 82 %   Neutro Abs 9.5 (H) 1.7 -  7.7 K/uL   Lymphocytes Relative 10 %   Lymphs Abs 1.2 0.7 - 4.0 K/uL   Monocytes Relative 6 %   Monocytes Absolute 0.7 0.1 - 1.0 K/uL   Eosinophils Relative 1 %   Eosinophils Absolute 0.1 0.0 - 0.5 K/uL   Basophils Relative 1 %   Basophils Absolute 0.1 0.0 - 0.1 K/uL   Immature Granulocytes 0 %   Abs Immature Granulocytes 0.04 0.00 - 0.07 K/uL    Comment: Performed at [facility], [address] [place], [place] [address]  Basic  metabolic panel     Status: Abnormal   Collection Time: 07/09/23 12:48 PM  Result Value Ref Range   Sodium 141 135 - 145 mmol/L   Potassium 3.6 3.5 - 5.1 mmol/L   Chloride 103 98 - 111 mmol/L   CO2 26 22 - 32 mmol/L   Glucose, Bld 122 (H) 70 - 99 mg/dL    Comment: Glucose reference range applies only to samples taken after fasting for at least 8 hours.   BUN 10 8 - 23 mg/dL   Creatinine, Ser 0.98 0.44 - 1.00 mg/dL   Calcium 9.1 8.9 - 11.9 mg/dL   GFR, Estimated >14 >78 mL/min    Comment: (NOTE) Calculated using the CKD-EPI Creatinine Equation (2021)    Anion gap 12 5 - 15    Comment: Performed at [facility], [address], [place], [place] [address]   CT Hip Right Wo Contrast  Result Date: 07/09/2023 CLINICAL DATA:  Fracture, hip EXAM: CT OF THE RIGHT HIP WITHOUT CONTRAST TECHNIQUE: Multidetector CT imaging of the right hip was performed according to the standard protocol. Multiplanar CT image reconstructions were also generated. RADIATION DOSE REDUCTION: This exam was performed according to the departmental dose-optimization program which includes automated exposure control, adjustment of the mA and/or kV according to patient size and/or use of iterative reconstruction technique. COMPARISON:  Same-day radiograph of the right hip at 11:27 a.m. FINDINGS: Bones/Joint/Cartilage There is an acute fracture of the right subcapital femoral head/neck with mild foreshortening. The right femoral head is seated within the acetabulum. No additional acute fracture  identified. Remote healed right inferior pubic ramus fracture. The right sacroiliac joint and pubic symphysis are anatomically aligned. Degenerative changes of the right hip. Chondrocalcinosis of the pubic symphysis and right hip. Partially visualized lumbosacral fusion hardware. Ligaments Suboptimally assessed by CT. Soft tissues/Muscles. Soft tissue swelling overlying the right lateral hip is noted. No discrete fluid collection or hematoma identified. No enlarged lymph nodes identified in the field of view. The visualized intra-abdominal contents are unremarkable. IMPRESSION: 1. Acute right subcapital femoral head/neck fracture with mild foreshortening. The right femoral head is seated within the acetabulum. 2. Remote healed right inferior pubic ramus fracture. Electronically Signed   By: Hart Robinsons M.D.   On: 07/09/2023 16:35   DG HIP UNILAT WITH PELVIS 2-3 VIEWS RIGHT  Result Date: 07/09/2023 CLINICAL DATA:  144615 Pain 144615 EXAM: DG HIP (WITH OR WITHOUT PELVIS) 2-3V RIGHT COMPARISON:  None Available. FINDINGS: There is subtle cortical irregularity along the medial border of the right femoral neck, along with relative foreshortening of the femoral neck relative to the contralateral side. Advanced osteopenia. Soft tissues unremarkable. IMPRESSION: Subtle cortical irregularity and foreshortening of the right femoral neck raises suspicion for impacted/displaced fracture. Further evaluation with cross-sectional imaging is advised. Electronically Signed   By: Olive Bass M.D.   On: 07/09/2023 13:23   DG Lumbar Spine Complete  Result Date: 07/09/2023 CLINICAL DATA:  fall EXAM: LUMBAR SPINE - COMPLETE 4+ VIEW COMPARISON:  None Available. FINDINGS: Status post L2-S1 posterior lumbosacral fusion. Hardware alignment appears grossly within normal limits findings of hardware fracture. No compression deformity identified. Advanced osteopenia. Paravertebral soft tissues are IMPRESSION: Postsurgical changes  L2-S1 posterior lumbosacral fusion. No acute fracture or hardware complicating feature is identified, exam is somewhat limited advanced osteopenia. Consider CT for further evaluation if appropriate. Electronically Signed   By: Olive Bass M.D.   On: 07/09/2023 13:17   CT Head Wo Contrast  Result Date: 07/09/2023 CLINICAL  DATA:  Neck trauma.  Fall.  On blood thinner. EXAM: CT HEAD WITHOUT CONTRAST CT CERVICAL SPINE WITHOUT CONTRAST TECHNIQUE: Multidetector CT imaging of the head and cervical spine was performed following the standard protocol without intravenous contrast. Multiplanar CT image reconstructions of the cervical spine were also generated. RADIATION DOSE REDUCTION: This exam was performed according to the departmental dose-optimization program which includes automated exposure control, adjustment of the mA and/or kV according to patient size and/or use of iterative reconstruction technique. COMPARISON:  None Available. FINDINGS: CT HEAD FINDINGS Brain: No evidence of acute infarction, hemorrhage, hydrocephalus, extra-axial collection or mass lesion/mass effect. Vascular: No hyperdense vessel or unexpected calcification. Skull: Normal. Negative for fracture or focal lesion. Sinuses/Orbits: No evidence of injury. Mild mucosal thickening in the paranasal sinuses. CT CERVICAL SPINE FINDINGS Alignment: Normal. Skull base and vertebrae: No acute fracture. No primary bone lesion or focal pathologic process. Soft tissues and spinal canal: No prevertebral fluid or swelling. No visible canal hematoma. Goiter Disc levels:  Much less than typical degenerative change for age. Upper chest: No evidence of injury IMPRESSION: No evidence of intracranial or cervical spine injury. Electronically Signed   By: Tiburcio Pea M.D.   On: 07/09/2023 12:06   CT Cervical Spine Wo Contrast  Result Date: 07/09/2023 CLINICAL DATA:  Neck trauma.  Fall.  On blood thinner. EXAM: CT HEAD WITHOUT CONTRAST CT CERVICAL SPINE  WITHOUT CONTRAST TECHNIQUE: Multidetector CT imaging of the head and cervical spine was performed following the standard protocol without intravenous contrast. Multiplanar CT image reconstructions of the cervical spine were also generated. RADIATION DOSE REDUCTION: This exam was performed according to the departmental dose-optimization program which includes automated exposure control, adjustment of the mA and/or kV according to patient size and/or use of iterative reconstruction technique. COMPARISON:  None Available. FINDINGS: CT HEAD FINDINGS Brain: No evidence of acute infarction, hemorrhage, hydrocephalus, extra-axial collection or mass lesion/mass effect. Vascular: No hyperdense vessel or unexpected calcification. Skull: Normal. Negative for fracture or focal lesion. Sinuses/Orbits: No evidence of injury. Mild mucosal thickening in the paranasal sinuses. CT CERVICAL SPINE FINDINGS Alignment: Normal. Skull base and vertebrae: No acute fracture. No primary bone lesion or focal pathologic process. Soft tissues and spinal canal: No prevertebral fluid or swelling. No visible canal hematoma. Goiter Disc levels:  Much less than typical degenerative change for age. Upper chest: No evidence of injury IMPRESSION: No evidence of intracranial or cervical spine injury. Electronically Signed   By: Tiburcio Pea M.D.   On: 07/09/2023 12:06    Pending Labs Unresulted Labs (From admission, onward)    None       Vitals/Pain Today's Vitals   07/09/23 1345 07/09/23 1429 07/09/23 1430 07/09/23 1500  BP: (!) 171/84  (!) 179/62 (!) 166/64  Pulse: 75  80 73  Resp: 12  16 19   Temp:  98.1 F (36.7 C)    TempSrc:  Oral    SpO2: 94%  95% 94%  PainSc:        Isolation Precautions No active isolations  Medications Medications  oxyCODONE (Oxy IR/ROXICODONE) immediate release tablet 10 mg (has no administration in time range)  diltiazem (CARDIZEM) tablet 60 mg (has no administration in time range)  losartan  (COZAAR) tablet 50 mg (50 mg Oral Given 07/09/23 1546)  sertraline (ZOLOFT) tablet 75 mg (has no administration in time range)  heparin injection 5,000 Units (has no administration in time range)  sodium chloride flush (NS) 0.9 % injection 3 mL (3 mLs Intravenous Not  Given 07/09/23 1547)  sodium chloride flush (NS) 0.9 % injection 3 mL (3 mLs Intravenous Given 07/09/23 1546)  sodium chloride flush (NS) 0.9 % injection 3 mL (has no administration in time range)  0.9 %  sodium chloride infusion (has no administration in time range)  acetaminophen (TYLENOL) tablet 650 mg (has no administration in time range)    Or  acetaminophen (TYLENOL) suppository 650 mg (has no administration in time range)  traZODone (DESYREL) tablet 50 mg (has no administration in time range)  polyethylene glycol (MIRALAX / GLYCOLAX) packet 17 g (has no administration in time range)  bisacodyl (DULCOLAX) suppository 10 mg (has no administration in time range)  labetalol (NORMODYNE) injection 10 mg (has no administration in time range)  HYDROmorphone (DILAUDID) injection 0.5 mg (0.5 mg Intravenous Given 07/09/23 1546)  fentaNYL (SUBLIMAZE) injection 50 mcg (50 mcg Intravenous Given 07/09/23 1132)  HYDROmorphone (DILAUDID) injection 0.5 mg (0.5 mg Intravenous Given 07/09/23 1250)  HYDROmorphone (DILAUDID) injection 1 mg (1 mg Intravenous Given 07/09/23 1423)  methocarbamol (ROBAXIN) tablet 500 mg (500 mg Oral Given 07/09/23 1546)    Mobility non-ambulatory     Focused Assessments Cardiac Assessment Handoff:  Cardiac Rhythm: Atrial fibrillation Lab Results  Component Value Date   TROPONINI <0.03 04/28/2017   Lab Results  Component Value Date   DDIMER 0.91 (H) 04/28/2017   Does the Patient currently have chest pain? No    R Recommendations: See Admitting Provider Note  Report given to:   Additional Notes: n/a

## 2023-07-09 NOTE — H&P (Signed)
Patient Demographics:    Tanya Mueller, is a 88 y.o. female  MRN: [id number]   DOB - 1934/11/13  Admit Date - 07/09/2023  Outpatient Primary MD for the patient is Sasser, Clarene Critchley, MD   Assessment & Plan:   Assessment and Plan: 1)Rt Hip Fx--suspect mechanical fall Right hip CT shows---Acute right subcapital femoral head/neck fracture with mild foreshortening. The right femoral head is seated within the acetabulum. -Orthopedic surgeon requested admission with plan for ORIF tentatively for 07/10/2023 -IV and oral pain medications as ordered  2) leukocytosis--- previously 11.7 probably reactive in the setting of mechanical fall with hip fracture - 3) chronic back pain/prior back surgeries--- --Lumbar x-rays show Postsurgical changes L2-S1 posterior lumbosacral fusion. No acute fracture or hardware complicating feature is identified,  -Continue pain control with opiates  4)h/o opioid-induced constipation--- due to #3 above- -continue laxatives  5)PAFib-history of paroxysmal A-fib -Eliquis on hold to allow for hip surgery -May use Cardizem as needed elevated HR --Last dose of Eliquis was 6 AM on 07/08/2023 -EKG sinus rhythm with prolonged QT and premature atrial contractions  6)HTN--- continue losartan  Cardizem as needed elevated -IV labetalol as needed elevated BP   Status is: Inpatient  Remains inpatient appropriate because:   Dispo: The patient is from: Home              Anticipated d/c is to: Home              Anticipated d/c date is: 2 days              Patient currently is not medically stable to d/c. Barriers: Not Clinically Stable-  With History of - Reviewed by me  Past Medical History:  Diagnosis Date   Allergic rhinitis    Arthritis    Back ,hips   Cancer (HCC)    Complication of anesthesia     awareness during general anesthesia.   Dysrhythmia    A-Fib and PSVT   Fatigue    History of atrial fibrillation    episode 2013 converted with cardizem-- happened at time acute illness due to kidney stone --  resolved  no issues since   History of kidney stones    History of sepsis    urosepsis  2013 due to kidney stone   Hypertension    Left nephrolithiasis    Left ureteral calculus    Low back pain    Neuromuscular disorder (HCC)    PAC (premature atrial contraction)    Scoliosis    Solitary lung nodule    benign right middle lobe per CT   Tremor       Past Surgical History:  Procedure Laterality Date   BACK SURGERY  2015   rod and screws   CATARACT EXTRACTION W/ INTRAOCULAR LENS  IMPLANT, BILATERAL  10/02/2008   CYSTOSCOPY WITH BIOPSY N/A 04/08/2019   Procedure: CYSTOSCOPY WITH bladder BIOPSY, FULGURATION;  Surgeon: Jerilee Field, MD;  Location: [facility];  Service: Urology;  Laterality: N/A;   CYSTOSCOPY WITH STENT PLACEMENT Left 09/15/2014   Procedure: CYSTOSCOPY WITH STENT PLACEMENT;  Surgeon: Doyne Keel, MD;  Location: [facility];  Service: Urology;  Laterality: Left;   CYSTOSCOPY/RETROGRADE/URETEROSCOPY Left 08/09/2014   Procedure: CYSTOSCOPY/RETROGRADE/LEFT STENT;  Surgeon: Valetta Fuller, MD;  Location: [facility];  Service: Urology;  Laterality: Left;   CYSTOSCOPY/URETEROSCOPY/HOLMIUM LASER/STENT PLACEMENT Left 09/15/2022   Procedure: CYSTOSCOPY LEFT URETEROSCOPY/STONE BASKET EXTRACTION;  Surgeon: Bjorn Pippin, MD;  Location: [facility];  Service: Urology;  Laterality: Left;  1 HR FOR CASE   EXTRACORPOREAL SHOCK WAVE LITHOTRIPSY  10/03/2011   HOLMIUM LASER APPLICATION Left 09/15/2014   Procedure: HOLMIUM LASER APPLICATION;  Surgeon: Doyne Keel, MD;  Location: [facility];  Service: Urology;  Laterality: Left;   IR URETERAL STENT LEFT NEW ACCESS W/O SEP NEPHROSTOMY CATH  08/18/2022   LAPAROTOMY W/  UNILATERAL  SALPINGOOPHORECTOMY  age 71   NEPHROLITHOTOMY Left 08/18/2022   Procedure: LEFT NEPHROLITHOTOMY PERCUTANEOUS;  Surgeon: Bjorn Pippin, MD;  Location: [facility];  Service: Urology;  Laterality: Left;  2.5 HRS FOR CASE   TOTAL ABDOMINAL HYSTERECTOMY  age 89   w/ unilateral salpingoophorectomy   TRANSTHORACIC ECHOCARDIOGRAM  02/13/2012   mild LVH/  ef 65%/  trivial MR  &  PR/  mild RVE   URETEROSCOPY Left 09/15/2014   Procedure: URETEROSCOPY;  Surgeon: Doyne Keel, MD;  Location: [facility];  Service: Urology;  Laterality: Left;    Chief Complaint  Patient presents with   Fall      HPI:    Tanya Mueller  is a 88 y.o. female with pmhx relevant for chronic back pain/prior back surgeries, chronic opiate use, with h/o opioid-induced constipation, nephrolithiasis and HTN, history of paroxysmal A-fib currently on Eliquis for stroke prevention who presents from home after mechanical fall with Rt hip pain and is found to have left hip fracture - Orthopedic surgeon requested admission with plan for ORIF tentatively for 07/10/2023 -Last dose of Eliquis was 6 AM on 07/08/2023 -Additional history obtained from patient's daughter Rinda at bedside -EKG sinus rhythm with prolonged QT and premature atrial contractions -Right hip CT shows---Acute right subcapital femoral head/neck fracture with mild foreshortening. The right femoral head is seated within the acetabulum. -Lumbar x-rays show Postsurgical changes L2-S1 posterior lumbosacral fusion. No acute fracture or hardware complicating feature is identified,  -CT head and CT C-spine without acute findings -Sodium is 141, potassium is 3.6, creatinine 0.66 -WBC 11.7, elevated MCV and MCH noted -UA noted No fever  Or chills  -No chest pains no palpitations no dizziness   Review of systems:    In addition to the HPI above,   A full Review of  Systems was done, all other systems reviewed are negative except as noted above in HPI ,  .    Social History:  Reviewed by me    Social History   Tobacco Use   Smoking status: Never    Passive exposure: Never   Smokeless tobacco: Never  Substance Use Topics   Alcohol use: No       Family History :  Reviewed by me    Family History  Problem Relation Age of Onset   Hypertension Mother    Cerebral aneurysm Father    Asthma Sister    Bladder Cancer Brother    Hypertension Brother    Bone cancer Brother    Cancer Other  Lung and bladder   Cancer Other        "Bone marrow cancer"     Home Medications:   Prior to Admission medications   Medication Sig Start Date End Date Taking? Authorizing Provider  acetaminophen (TYLENOL) 325 MG tablet Take 2 tablets (650 mg total) by mouth every 6 (six) hours as needed for mild pain (or Fever >/= 101). 09/16/22   Shon Hale, MD  apixaban (ELIQUIS) 5 MG TABS tablet Take 1 tablet (5 mg total) by mouth 2 (two) times daily. 03/22/23   Mallipeddi, Vishnu P, MD  diltiazem (CARDIZEM) 60 MG tablet Take 1 tablet (60 mg total) by mouth every 8 (eight) hours as needed (palpitations). 03/19/23   Mallipeddi, Vishnu P, MD  fluticasone (FLONASE) 50 MCG/ACT nasal spray Place 2 sprays into the nose daily as needed for allergies.    [provider]  furosemide (LASIX) 20 MG tablet Take 1 tablet (20 mg total) by mouth daily. May take an additional 20 mg daily as needed for leg swelling or shortness of breath 03/19/23   Mallipeddi, Vishnu P, MD  losartan (COZAAR) 50 MG tablet Take 1 tablet (50 mg total) by mouth daily. 08/29/22 08/24/23  Mallipeddi, Vishnu P, MD  Oxycodone HCl 10 MG TABS Take 10 mg by mouth 4 (four) times daily as needed. 12/01/22   [provider]  oxyCODONE-acetaminophen (PERCOCET) 10-325 MG tablet Take 1 tablet by mouth 4 (four) times daily as needed for pain. 08/28/22   [provider]  polyethylene glycol (MIRALAX / GLYCOLAX) 17 g packet Take 17 g by mouth daily as needed for moderate  constipation.    [provider]  potassium chloride (KLOR-CON M) 10 MEQ tablet Take 1 tablet (10 mEq total) by mouth daily. 12/14/22   Mallipeddi, Vishnu P, MD  Prucalopride Succinate (MOTEGRITY) 2 MG TABS Take 1 tablet (2 mg total) by mouth daily. 04/04/23   Letta Median, PA-C  sertraline (ZOLOFT) 50 MG tablet Take 75 mg by mouth daily. 10/30/22   [provider]     Allergies:     Allergies  Allergen Reactions   Morphine     Nervous     Physical Exam:   Vitals  Blood pressure (!) 191/75, pulse 84, temperature 98.8 F (37.1 C), temperature source Oral, resp. rate 20, height 5\' 5"  (1.651 m), weight 59.7 kg, SpO2 95%.  Physical Examination: General appearance - alert,  in no distress  Mental status - alert, oriented to person, place, and time,  Eyes - sclera anicteric Neck - supple, no JVD elevation , Chest - clear  to auscultation bilaterally, symmetrical air movement,  Heart - S1 and S2 normal, regular  Abdomen - soft, nontender, nondistended, +BS Neurological - screening mental status exam normal, neck supple without rigidity, cranial nerves II through XII intact, DTR's normal and symmetric Extremities - no pedal edema noted, intact peripheral pulses  Skin - warm, dry MSK-right hip pain with range of motion and palpation, right LE  is slightly shortened and slightly rotated    Data Review:    CBC Recent Labs  Lab 07/09/23 1248  WBC 11.7*  HGB 13.2  HCT 38.9  PLT 317  MCV 103.2*  MCH 35.0*  MCHC 33.9  RDW 12.6  LYMPHSABS 1.2  MONOABS 0.7  EOSABS 0.1  BASOSABS 0.1   ------------------------------------------------------------------------------------------------------------------  Chemistries  Recent Labs  Lab 07/09/23 1248  NA 141  K 3.6  CL 103  CO2 26  GLUCOSE 122*  BUN 10  CREATININE 0.66  CALCIUM 9.1    ------------------------------------------------------------------------------------------------------------------ estimated creatinine clearance is 45.4 mL/min (by C-G formula based on SCr of 0.66 mg/dL). ------------------------------------------------------------------------------------------------------------------ ------------------------------------------------------------------------------------------------------------------    Component Value Date/Time   BNP 427.0 (H) 12/10/2022 1342   Urinalysis    Component Value Date/Time   COLORURINE COLORLESS (A) 07/09/2023 1020   APPEARANCEUR CLEAR 07/09/2023 1020   APPEARANCEUR Clear 03/01/2023 1411   LABSPEC 1.002 (L) 07/09/2023 1020   PHURINE 7.0 07/09/2023 1020   GLUCOSEU NEGATIVE 07/09/2023 1020   HGBUR MODERATE (A) 07/09/2023 1020   BILIRUBINUR NEGATIVE 07/09/2023 1020   BILIRUBINUR Negative 03/01/2023 1411   KETONESUR NEGATIVE 07/09/2023 1020   PROTEINUR NEGATIVE 07/09/2023 1020   UROBILINOGEN 0.2 08/09/2014 1230   NITRITE NEGATIVE 07/09/2023 1020   LEUKOCYTESUR NEGATIVE 07/09/2023 1020     Imaging Results:    CT Hip Right Wo Contrast  Result Date: 07/09/2023 CLINICAL DATA:  Fracture, hip EXAM: CT OF THE RIGHT HIP WITHOUT CONTRAST TECHNIQUE: Multidetector CT imaging of the right hip was performed according to the standard protocol. Multiplanar CT image reconstructions were also generated. RADIATION DOSE REDUCTION: This exam was performed according to the departmental dose-optimization program which includes automated exposure control, adjustment of the mA and/or kV according to patient size and/or use of iterative reconstruction technique. COMPARISON:  Same-day radiograph of the right hip at 11:27 a.m. FINDINGS: Bones/Joint/Cartilage There is an acute fracture of the right subcapital femoral head/neck with mild foreshortening. The right femoral head is seated within the acetabulum. No additional acute fracture identified.  Remote healed right inferior pubic ramus fracture. The right sacroiliac joint and pubic symphysis are anatomically aligned. Degenerative changes of the right hip. Chondrocalcinosis of the pubic symphysis and right hip. Partially visualized lumbosacral fusion hardware. Ligaments Suboptimally assessed by CT. Soft tissues/Muscles. Soft tissue swelling overlying the right lateral hip is noted. No discrete fluid collection or hematoma identified. No enlarged lymph nodes identified in the field of view. The visualized intra-abdominal contents are unremarkable. IMPRESSION: 1. Acute right subcapital femoral head/neck fracture with mild foreshortening. The right femoral head is seated within the acetabulum. 2. Remote healed right inferior pubic ramus fracture. Electronically Signed   By: Hart Robinsons M.D.   On: 07/09/2023 16:35   DG HIP UNILAT WITH PELVIS 2-3 VIEWS RIGHT  Result Date: 07/09/2023 CLINICAL DATA:  144615 Pain 144615 EXAM: DG HIP (WITH OR WITHOUT PELVIS) 2-3V RIGHT COMPARISON:  None Available. FINDINGS: There is subtle cortical irregularity along the medial border of the right femoral neck, along with relative foreshortening of the femoral neck relative to the contralateral side. Advanced osteopenia. Soft tissues unremarkable. IMPRESSION: Subtle cortical irregularity and foreshortening of the right femoral neck raises suspicion for impacted/displaced fracture. Further evaluation with cross-sectional imaging is advised. Electronically Signed   By: Olive Bass M.D.   On: 07/09/2023 13:23   DG Lumbar Spine Complete  Result Date: 07/09/2023 CLINICAL DATA:  fall EXAM: LUMBAR SPINE - COMPLETE 4+ VIEW COMPARISON:  None Available. FINDINGS: Status post L2-S1 posterior lumbosacral fusion. Hardware alignment appears grossly within normal limits findings of hardware fracture. No compression deformity identified. Advanced osteopenia. Paravertebral soft tissues are IMPRESSION: Postsurgical changes L2-S1  posterior lumbosacral fusion. No acute fracture or hardware complicating feature is identified, exam is somewhat limited advanced osteopenia. Consider CT for further evaluation if appropriate. Electronically Signed   By: Olive Bass M.D.   On: 07/09/2023 13:17   CT Head Wo Contrast  Result Date: 07/09/2023 CLINICAL DATA:  Neck trauma.  Fall.  On blood thinner. EXAM: CT HEAD WITHOUT CONTRAST CT CERVICAL SPINE WITHOUT CONTRAST TECHNIQUE: Multidetector CT imaging of the head and cervical spine was performed following the standard protocol without intravenous contrast. Multiplanar CT image reconstructions of the cervical spine were also generated. RADIATION DOSE REDUCTION: This exam was performed according to the departmental dose-optimization program which includes automated exposure control, adjustment of the mA and/or kV according to patient size and/or use of iterative reconstruction technique. COMPARISON:  None Available. FINDINGS: CT HEAD FINDINGS Brain: No evidence of acute infarction, hemorrhage, hydrocephalus, extra-axial collection or mass lesion/mass effect. Vascular: No hyperdense vessel or unexpected calcification. Skull: Normal. Negative for fracture or focal lesion. Sinuses/Orbits: No evidence of injury. Mild mucosal thickening in the paranasal sinuses. CT CERVICAL SPINE FINDINGS Alignment: Normal. Skull base and vertebrae: No acute fracture. No primary bone lesion or focal pathologic process. Soft tissues and spinal canal: No prevertebral fluid or swelling. No visible canal hematoma. Goiter Disc levels:  Much less than typical degenerative change for age. Upper chest: No evidence of injury IMPRESSION: No evidence of intracranial or cervical spine injury. Electronically Signed   By: Tiburcio Pea M.D.   On: 07/09/2023 12:06   CT Cervical Spine Wo Contrast  Result Date: 07/09/2023 CLINICAL DATA:  Neck trauma.  Fall.  On blood thinner. EXAM: CT HEAD WITHOUT CONTRAST CT CERVICAL SPINE WITHOUT  CONTRAST TECHNIQUE: Multidetector CT imaging of the head and cervical spine was performed following the standard protocol without intravenous contrast. Multiplanar CT image reconstructions of the cervical spine were also generated. RADIATION DOSE REDUCTION: This exam was performed according to the departmental dose-optimization program which includes automated exposure control, adjustment of the mA and/or kV according to patient size and/or use of iterative reconstruction technique. COMPARISON:  None Available. FINDINGS: CT HEAD FINDINGS Brain: No evidence of acute infarction, hemorrhage, hydrocephalus, extra-axial collection or mass lesion/mass effect. Vascular: No hyperdense vessel or unexpected calcification. Skull: Normal. Negative for fracture or focal lesion. Sinuses/Orbits: No evidence of injury. Mild mucosal thickening in the paranasal sinuses. CT CERVICAL SPINE FINDINGS Alignment: Normal. Skull base and vertebrae: No acute fracture. No primary bone lesion or focal pathologic process. Soft tissues and spinal canal: No prevertebral fluid or swelling. No visible canal hematoma. Goiter Disc levels:  Much less than typical degenerative change for age. Upper chest: No evidence of injury IMPRESSION: No evidence of intracranial or cervical spine injury. Electronically Signed   By: Tiburcio Pea M.D.   On: 07/09/2023 12:06    Radiological Exams on Admission: CT Hip Right Wo Contrast  Result Date: 07/09/2023 CLINICAL DATA:  Fracture, hip EXAM: CT OF THE RIGHT HIP WITHOUT CONTRAST TECHNIQUE: Multidetector CT imaging of the right hip was performed according to the standard protocol. Multiplanar CT image reconstructions were also generated. RADIATION DOSE REDUCTION: This exam was performed according to the departmental dose-optimization program which includes automated exposure control, adjustment of the mA and/or kV according to patient size and/or use of iterative reconstruction technique. COMPARISON:  Same-day  radiograph of the right hip at 11:27 a.m. FINDINGS: Bones/Joint/Cartilage There is an acute fracture of the right subcapital femoral head/neck with mild foreshortening. The right femoral head is seated within the acetabulum. No additional acute fracture identified. Remote healed right inferior pubic ramus fracture. The right sacroiliac joint and pubic symphysis are anatomically aligned. Degenerative changes of the right hip. Chondrocalcinosis of the pubic symphysis and right hip. Partially visualized lumbosacral fusion hardware. Ligaments Suboptimally assessed by CT. Soft tissues/Muscles. Soft tissue swelling overlying  the right lateral hip is noted. No discrete fluid collection or hematoma identified. No enlarged lymph nodes identified in the field of view. The visualized intra-abdominal contents are unremarkable. IMPRESSION: 1. Acute right subcapital femoral head/neck fracture with mild foreshortening. The right femoral head is seated within the acetabulum. 2. Remote healed right inferior pubic ramus fracture. Electronically Signed   By: Hart Robinsons M.D.   On: 07/09/2023 16:35   DG HIP UNILAT WITH PELVIS 2-3 VIEWS RIGHT  Result Date: 07/09/2023 CLINICAL DATA:  144615 Pain 144615 EXAM: DG HIP (WITH OR WITHOUT PELVIS) 2-3V RIGHT COMPARISON:  None Available. FINDINGS: There is subtle cortical irregularity along the medial border of the right femoral neck, along with relative foreshortening of the femoral neck relative to the contralateral side. Advanced osteopenia. Soft tissues unremarkable. IMPRESSION: Subtle cortical irregularity and foreshortening of the right femoral neck raises suspicion for impacted/displaced fracture. Further evaluation with cross-sectional imaging is advised. Electronically Signed   By: Olive Bass M.D.   On: 07/09/2023 13:23   DG Lumbar Spine Complete  Result Date: 07/09/2023 CLINICAL DATA:  fall EXAM: LUMBAR SPINE - COMPLETE 4+ VIEW COMPARISON:  None Available. FINDINGS:  Status post L2-S1 posterior lumbosacral fusion. Hardware alignment appears grossly within normal limits findings of hardware fracture. No compression deformity identified. Advanced osteopenia. Paravertebral soft tissues are IMPRESSION: Postsurgical changes L2-S1 posterior lumbosacral fusion. No acute fracture or hardware complicating feature is identified, exam is somewhat limited advanced osteopenia. Consider CT for further evaluation if appropriate. Electronically Signed   By: Olive Bass M.D.   On: 07/09/2023 13:17   CT Head Wo Contrast  Result Date: 07/09/2023 CLINICAL DATA:  Neck trauma.  Fall.  On blood thinner. EXAM: CT HEAD WITHOUT CONTRAST CT CERVICAL SPINE WITHOUT CONTRAST TECHNIQUE: Multidetector CT imaging of the head and cervical spine was performed following the standard protocol without intravenous contrast. Multiplanar CT image reconstructions of the cervical spine were also generated. RADIATION DOSE REDUCTION: This exam was performed according to the departmental dose-optimization program which includes automated exposure control, adjustment of the mA and/or kV according to patient size and/or use of iterative reconstruction technique. COMPARISON:  None Available. FINDINGS: CT HEAD FINDINGS Brain: No evidence of acute infarction, hemorrhage, hydrocephalus, extra-axial collection or mass lesion/mass effect. Vascular: No hyperdense vessel or unexpected calcification. Skull: Normal. Negative for fracture or focal lesion. Sinuses/Orbits: No evidence of injury. Mild mucosal thickening in the paranasal sinuses. CT CERVICAL SPINE FINDINGS Alignment: Normal. Skull base and vertebrae: No acute fracture. No primary bone lesion or focal pathologic process. Soft tissues and spinal canal: No prevertebral fluid or swelling. No visible canal hematoma. Goiter Disc levels:  Much less than typical degenerative change for age. Upper chest: No evidence of injury IMPRESSION: No evidence of intracranial or  cervical spine injury. Electronically Signed   By: Tiburcio Pea M.D.   On: 07/09/2023 12:06   CT Cervical Spine Wo Contrast  Result Date: 07/09/2023 CLINICAL DATA:  Neck trauma.  Fall.  On blood thinner. EXAM: CT HEAD WITHOUT CONTRAST CT CERVICAL SPINE WITHOUT CONTRAST TECHNIQUE: Multidetector CT imaging of the head and cervical spine was performed following the standard protocol without intravenous contrast. Multiplanar CT image reconstructions of the cervical spine were also generated. RADIATION DOSE REDUCTION: This exam was performed according to the departmental dose-optimization program which includes automated exposure control, adjustment of the mA and/or kV according to patient size and/or use of iterative reconstruction technique. COMPARISON:  None Available. FINDINGS: CT HEAD FINDINGS Brain: No evidence of  acute infarction, hemorrhage, hydrocephalus, extra-axial collection or mass lesion/mass effect. Vascular: No hyperdense vessel or unexpected calcification. Skull: Normal. Negative for fracture or focal lesion. Sinuses/Orbits: No evidence of injury. Mild mucosal thickening in the paranasal sinuses. CT CERVICAL SPINE FINDINGS Alignment: Normal. Skull base and vertebrae: No acute fracture. No primary bone lesion or focal pathologic process. Soft tissues and spinal canal: No prevertebral fluid or swelling. No visible canal hematoma. Goiter Disc levels:  Much less than typical degenerative change for age. Upper chest: No evidence of injury IMPRESSION: No evidence of intracranial or cervical spine injury. Electronically Signed   By: Tiburcio Pea M.D.   On: 07/09/2023 12:06    DVT Prophylaxis -SCD Eliquis on hold to allow for hip surgery AM Labs Ordered, also please review Full Orders  Family Communication: Admission, patients condition and plan of care including tests being ordered have been discussed with the patient and daughter Rinda who indicate understanding and agree with the plan    Condition  -stable  Shon Hale M.D on 07/09/2023 at 7:42 PM Go to [email or web address] -  for contact info  [facility] - Office  [phone]

## 2023-07-09 NOTE — ED Triage Notes (Addendum)
Pt brought in by [facility] for c/o fall around 6 am this morning. Denies LOC or hitting head, takes Eliqius.   EMS states when they arrived she was sitting on the bedside commode. Pt c/o shooting pain down right leg and back pain 8/10, hx back surgeries. EMS: Fentanyl 50 mcg, 20 L AC, initial BP 200/97 then 170/80 at recheck. Pt alert and in NAD during triage.

## 2023-07-09 NOTE — Plan of Care (Signed)
  Problem: Education: Goal: Knowledge of General Education information will improve Description Including pain rating scale, medication(s)/side effects and non-pharmacologic comfort measures Outcome: Progressing   Problem: Health Behavior/Discharge Planning: Goal: Ability to manage health-related needs will improve Outcome: Progressing   

## 2023-07-09 NOTE — ED Provider Notes (Cosign Needed Addendum)
[place] EMERGENCY DEPARTMENT AT [facility] Provider Note   CSN: [id number] Arrival date & time: 07/09/23  9562     History  Chief Complaint  Patient presents with   Tanya Mueller    Tanya Mueller is a 88 y.o. female with a history of atrial for relation on Eliquis, tremor, hypertension, chronic low back pain with multiple lumbar surgeries presenting for evaluation of right hip pain and low back pain after falling around 6 AM today.  She was walking down her hallway when she pivoted to turn and lost balance and fell on her right side, stating the brunt of her fall landed on her right hip.  She denies any other complaints of pain, she did hit her head but denies any LOC, confusion, dizziness or headache since the fall.  Additionally she denies nausea or vomiting, focal weakness.  She was unable to stand without significant pain, she was able to crawl back to her bedroom where she was able to pull herself up using furniture.  She presents via EMS, she received fentanyl 50 mics during transport which she states was helpful but is now wearing off.  The history is provided by the patient.       Home Medications Prior to Admission medications   Medication Sig Start Date End Date Taking? Authorizing Provider  acetaminophen (TYLENOL) 325 MG tablet Take 2 tablets (650 mg total) by mouth every 6 (six) hours as needed for mild pain (or Fever >/= 101). 09/16/22   Shon Hale, MD  apixaban (ELIQUIS) 5 MG TABS tablet Take 1 tablet (5 mg total) by mouth 2 (two) times daily. 03/22/23   Mallipeddi, Vishnu P, MD  diltiazem (CARDIZEM) 60 MG tablet Take 1 tablet (60 mg total) by mouth every 8 (eight) hours as needed (palpitations). 03/19/23   Mallipeddi, Vishnu P, MD  fluticasone (FLONASE) 50 MCG/ACT nasal spray Place 2 sprays into the nose daily as needed for allergies.    [provider]  furosemide (LASIX) 20 MG tablet Take 1 tablet (20 mg total) by mouth daily. May take an additional 20  mg daily as needed for leg swelling or shortness of breath 03/19/23   Mallipeddi, Vishnu P, MD  losartan (COZAAR) 50 MG tablet Take 1 tablet (50 mg total) by mouth daily. 08/29/22 08/24/23  Mallipeddi, Vishnu P, MD  Oxycodone HCl 10 MG TABS Take 10 mg by mouth 4 (four) times daily as needed. 12/01/22   [provider]  oxyCODONE-acetaminophen (PERCOCET) 10-325 MG tablet Take 1 tablet by mouth 4 (four) times daily as needed for pain. 08/28/22   [provider]  polyethylene glycol (MIRALAX / GLYCOLAX) 17 g packet Take 17 g by mouth daily as needed for moderate constipation.    [provider]  potassium chloride (KLOR-CON M) 10 MEQ tablet Take 1 tablet (10 mEq total) by mouth daily. 12/14/22   Mallipeddi, Vishnu P, MD  Prucalopride Succinate (MOTEGRITY) 2 MG TABS Take 1 tablet (2 mg total) by mouth daily. 04/04/23   Letta Median, PA-C  sertraline (ZOLOFT) 50 MG tablet Take 75 mg by mouth daily. 10/30/22   [provider]      Allergies    Morphine    Review of Systems   Review of Systems  Constitutional:  Negative for fever.  HENT:  Negative for congestion.   Eyes: Negative.  Negative for visual disturbance.  Respiratory:  Negative for chest tightness and shortness of breath.   Cardiovascular:  Negative for chest  pain and palpitations.  Gastrointestinal:  Negative for abdominal pain, nausea and vomiting.  Genitourinary: Negative.   Musculoskeletal:  Positive for arthralgias. Negative for joint swelling and neck pain.  Skin: Negative.  Negative for rash and wound.  Neurological:  Negative for dizziness, weakness, light-headedness, numbness and headaches.  Psychiatric/Behavioral: Negative.      Physical Exam Updated Vital Signs BP (!) 179/62   Pulse 80   Temp 98.1 F (36.7 C) (Oral)   Resp 16   SpO2 95%  Physical Exam Vitals and nursing note reviewed.  Constitutional:      Appearance: She is well-developed.  HENT:     Head: Normocephalic and  atraumatic.  Eyes:     Conjunctiva/sclera: Conjunctivae normal.  Cardiovascular:     Rate and Rhythm: Normal rate and regular rhythm.     Heart sounds: Normal heart sounds.  Pulmonary:     Effort: Pulmonary effort is normal.     Breath sounds: Normal breath sounds. No wheezing.  Abdominal:     General: Bowel sounds are normal.     Palpations: Abdomen is soft.     Tenderness: There is no abdominal tenderness.  Musculoskeletal:        General: Tenderness present. No swelling or deformity. Normal range of motion.     Cervical back: Normal range of motion. Tenderness present. No spasms or bony tenderness.     Lumbar back: Tenderness present.     Right hip: Bony tenderness present.     Comments: Ttp right lateral hip, no shortening or external rotation. No knee, ankle or  right upper extremity pain or edema.  FROM of major joints upper extremities.  Skin:    General: Skin is warm and dry.  Neurological:     Mental Status: She is alert.     ED Results / Procedures / Treatments   Labs (all labs ordered are listed, but only abnormal results are displayed) Labs Reviewed  URINALYSIS, ROUTINE W REFLEX MICROSCOPIC - Abnormal; Notable for the following components:      Result Value   Color, Urine COLORLESS (*)    Specific Gravity, Urine 1.002 (*)    Hgb urine dipstick MODERATE (*)    All other components within normal limits  CBC WITH DIFFERENTIAL/PLATELET - Abnormal; Notable for the following components:   WBC 11.7 (*)    RBC 3.77 (*)    MCV 103.2 (*)    MCH 35.0 (*)    Neutro Abs 9.5 (*)    All other components within normal limits  BASIC METABOLIC PANEL - Abnormal; Notable for the following components:   Glucose, Bld 122 (*)    All other components within normal limits    EKG None  Radiology DG HIP UNILAT WITH PELVIS 2-3 VIEWS RIGHT  Result Date: 07/09/2023 CLINICAL DATA:  161096 Pain 144615 EXAM: DG HIP (WITH OR WITHOUT PELVIS) 2-3V RIGHT COMPARISON:  None Available.  FINDINGS: There is subtle cortical irregularity along the medial border of the right femoral neck, along with relative foreshortening of the femoral neck relative to the contralateral side. Advanced osteopenia. Soft tissues unremarkable. IMPRESSION: Subtle cortical irregularity and foreshortening of the right femoral neck raises suspicion for impacted/displaced fracture. Further evaluation with cross-sectional imaging is advised. Electronically Signed   By: Olive Bass M.D.   On: 07/09/2023 13:23   DG Lumbar Spine Complete  Result Date: 07/09/2023 CLINICAL DATA:  fall EXAM: LUMBAR SPINE - COMPLETE 4+ VIEW COMPARISON:  None Available. FINDINGS: Status post L2-S1 posterior lumbosacral  fusion. Hardware alignment appears grossly within normal limits findings of hardware fracture. No compression deformity identified. Advanced osteopenia. Paravertebral soft tissues are IMPRESSION: Postsurgical changes L2-S1 posterior lumbosacral fusion. No acute fracture or hardware complicating feature is identified, exam is somewhat limited advanced osteopenia. Consider CT for further evaluation if appropriate. Electronically Signed   By: Olive Bass M.D.   On: 07/09/2023 13:17   CT Head Wo Contrast  Result Date: 07/09/2023 CLINICAL DATA:  Neck trauma.  Fall.  On blood thinner. EXAM: CT HEAD WITHOUT CONTRAST CT CERVICAL SPINE WITHOUT CONTRAST TECHNIQUE: Multidetector CT imaging of the head and cervical spine was performed following the standard protocol without intravenous contrast. Multiplanar CT image reconstructions of the cervical spine were also generated. RADIATION DOSE REDUCTION: This exam was performed according to the departmental dose-optimization program which includes automated exposure control, adjustment of the mA and/or kV according to patient size and/or use of iterative reconstruction technique. COMPARISON:  None Available. FINDINGS: CT HEAD FINDINGS Brain: No evidence of acute infarction, hemorrhage,  hydrocephalus, extra-axial collection or mass lesion/mass effect. Vascular: No hyperdense vessel or unexpected calcification. Skull: Normal. Negative for fracture or focal lesion. Sinuses/Orbits: No evidence of injury. Mild mucosal thickening in the paranasal sinuses. CT CERVICAL SPINE FINDINGS Alignment: Normal. Skull base and vertebrae: No acute fracture. No primary bone lesion or focal pathologic process. Soft tissues and spinal canal: No prevertebral fluid or swelling. No visible canal hematoma. Goiter Disc levels:  Much less than typical degenerative change for age. Upper chest: No evidence of injury IMPRESSION: No evidence of intracranial or cervical spine injury. Electronically Signed   By: Tiburcio Pea M.D.   On: 07/09/2023 12:06   CT Cervical Spine Wo Contrast  Result Date: 07/09/2023 CLINICAL DATA:  Neck trauma.  Fall.  On blood thinner. EXAM: CT HEAD WITHOUT CONTRAST CT CERVICAL SPINE WITHOUT CONTRAST TECHNIQUE: Multidetector CT imaging of the head and cervical spine was performed following the standard protocol without intravenous contrast. Multiplanar CT image reconstructions of the cervical spine were also generated. RADIATION DOSE REDUCTION: This exam was performed according to the departmental dose-optimization program which includes automated exposure control, adjustment of the mA and/or kV according to patient size and/or use of iterative reconstruction technique. COMPARISON:  None Available. FINDINGS: CT HEAD FINDINGS Brain: No evidence of acute infarction, hemorrhage, hydrocephalus, extra-axial collection or mass lesion/mass effect. Vascular: No hyperdense vessel or unexpected calcification. Skull: Normal. Negative for fracture or focal lesion. Sinuses/Orbits: No evidence of injury. Mild mucosal thickening in the paranasal sinuses. CT CERVICAL SPINE FINDINGS Alignment: Normal. Skull base and vertebrae: No acute fracture. No primary bone lesion or focal pathologic process. Soft tissues and  spinal canal: No prevertebral fluid or swelling. No visible canal hematoma. Goiter Disc levels:  Much less than typical degenerative change for age. Upper chest: No evidence of injury IMPRESSION: No evidence of intracranial or cervical spine injury. Electronically Signed   By: Tiburcio Pea M.D.   On: 07/09/2023 12:06    Procedures Procedures    Medications Ordered in ED Medications  Oxycodone HCl TABS 10 mg (has no administration in time range)  diltiazem (CARDIZEM) tablet 60 mg (has no administration in time range)  losartan (COZAAR) tablet 50 mg (has no administration in time range)  sertraline (ZOLOFT) tablet 75 mg (has no administration in time range)  heparin injection 5,000 Units (has no administration in time range)  sodium chloride flush (NS) 0.9 % injection 3 mL (has no administration in time range)  sodium chloride flush (  NS) 0.9 % injection 3 mL (has no administration in time range)  sodium chloride flush (NS) 0.9 % injection 3 mL (has no administration in time range)  0.9 %  sodium chloride infusion (has no administration in time range)  acetaminophen (TYLENOL) tablet 650 mg (has no administration in time range)    Or  acetaminophen (TYLENOL) suppository 650 mg (has no administration in time range)  traZODone (DESYREL) tablet 50 mg (has no administration in time range)  polyethylene glycol (MIRALAX / GLYCOLAX) packet 17 g (has no administration in time range)  bisacodyl (DULCOLAX) suppository 10 mg (has no administration in time range)  labetalol (NORMODYNE) injection 10 mg (has no administration in time range)  HYDROmorphone (DILAUDID) injection 0.5 mg (has no administration in time range)  methocarbamol (ROBAXIN) tablet 500 mg (has no administration in time range)  fentaNYL (SUBLIMAZE) injection 50 mcg (50 mcg Intravenous Given 07/09/23 1132)  HYDROmorphone (DILAUDID) injection 0.5 mg (0.5 mg Intravenous Given 07/09/23 1250)  HYDROmorphone (DILAUDID) injection 1 mg (1 mg  Intravenous Given 07/09/23 1423)    ED Course/ Medical Decision Making/ A&P                                 Medical Decision Making Amount and/or Complexity of Data Reviewed Labs: ordered.    Details: Labs ordered in anticipation of needing admission, her be met is reassuring, she does have a WBC count 11.7, suspect that may be pain driven her urinalysis is unremarkable, does show moderate hemoglobin but she has 0-5 RBCs on the visual. Radiology: ordered. Discussion of management or test interpretation with external provider(s): I spoke with Dr. Romeo Apple of orthopedics who will follow patient, she will probably go to the OR on Wednesday, she does not need to be n.p.o. to day.  CT hip requested which has been ordered, this will help determine pinning versus full arthroplasty.  Call placed to the hospitalist for admission.  Discussed with Dr. Mariea Clonts who accepts pt for admission.  Risk Prescription drug management. Decision regarding hospitalization.           Final Clinical Impression(s) / ED Diagnoses Final diagnoses:  Closed fracture of right hip, initial encounter [facility])    Rx / DC Orders ED Discharge Orders     None         Victoriano Lain 07/09/23 1446    Burgess Amor, PA-C 07/09/23 1451    Loetta Rough, MD 07/10/23 [phone]

## 2023-07-09 NOTE — ED Notes (Signed)
Patient transported to CT 

## 2023-07-10 ENCOUNTER — Inpatient Hospital Stay (HOSPITAL_COMMUNITY): Payer: PPO | Admitting: Anesthesiology

## 2023-07-10 ENCOUNTER — Inpatient Hospital Stay (HOSPITAL_COMMUNITY): Payer: PPO

## 2023-07-10 ENCOUNTER — Encounter (HOSPITAL_COMMUNITY): Payer: Self-pay | Admitting: Family Medicine

## 2023-07-10 ENCOUNTER — Other Ambulatory Visit: Payer: Self-pay

## 2023-07-10 ENCOUNTER — Encounter (HOSPITAL_COMMUNITY)
Admission: EM | Disposition: A | Discharge status: Skilled Nursing Facility | Payer: Self-pay | Source: Home / Self Care | Attending: Family Medicine

## 2023-07-10 DIAGNOSIS — I4891 Unspecified atrial fibrillation: Secondary | ICD-10-CM

## 2023-07-10 DIAGNOSIS — S72001A Fracture of unspecified part of neck of right femur, initial encounter for closed fracture: Secondary | ICD-10-CM

## 2023-07-10 DIAGNOSIS — M25551 Pain in right hip: Secondary | ICD-10-CM | POA: Diagnosis not present

## 2023-07-10 HISTORY — PX: HIP PINNING,CANNULATED: SHX1758

## 2023-07-10 SURGERY — FIXATION, FEMUR, NECK, PERCUTANEOUS, USING SCREW
Anesthesia: General | Site: Hip | Laterality: Right

## 2023-07-10 MED ORDER — HYDROMORPHONE HCL 1 MG/ML IJ SOLN
0.5000 mg | INTRAMUSCULAR | Status: DC | PRN
  Administered 2023-07-10 – 2023-07-11 (×2): 0.5 mg via INTRAVENOUS
  Filled 2023-07-10 (×2): qty 0.5

## 2023-07-10 MED ORDER — HYDROCODONE-ACETAMINOPHEN 5-325 MG PO TABS
ORAL_TABLET | ORAL | Status: AC
  Filled 2023-07-10: qty 1

## 2023-07-10 MED ORDER — 0.9 % SODIUM CHLORIDE (POUR BTL) OPTIME
TOPICAL | Status: DC | PRN
  Administered 2023-07-10: 1000 mL

## 2023-07-10 MED ORDER — PROPOFOL 10 MG/ML IV BOLUS
INTRAVENOUS | Status: DC | PRN
  Administered 2023-07-10: 30 mg via INTRAVENOUS
  Administered 2023-07-10: 120 mg via INTRAVENOUS

## 2023-07-10 MED ORDER — METHOCARBAMOL 500 MG PO TABS
500.0000 mg | ORAL_TABLET | Freq: Four times a day (QID) | ORAL | Status: DC | PRN
  Administered 2023-07-10: 500 mg via ORAL
  Filled 2023-07-10 (×2): qty 1

## 2023-07-10 MED ORDER — CHLORHEXIDINE GLUCONATE 0.12 % MT SOLN
OROMUCOSAL | Status: AC
  Administered 2023-07-10: 15 mL via OROMUCOSAL
  Filled 2023-07-10: qty 15

## 2023-07-10 MED ORDER — LIDOCAINE HCL (PF) 2 % IJ SOLN
INTRAMUSCULAR | Status: AC
  Filled 2023-07-10: qty 10

## 2023-07-10 MED ORDER — MENTHOL 3 MG MT LOZG: 1.0000 | LOZENGE | OROMUCOSAL | Status: DC | PRN

## 2023-07-10 MED ORDER — PHENYLEPHRINE HCL-NACL 20-0.9 MG/250ML-% IV SOLN
INTRAVENOUS | Status: AC
  Filled 2023-07-10: qty 250

## 2023-07-10 MED ORDER — LACTATED RINGERS IV SOLN
INTRAVENOUS | Status: DC | PRN
Start: 2023-07-10 — End: 2023-07-10

## 2023-07-10 MED ORDER — BUPIVACAINE-EPINEPHRINE (PF) 0.5% -1:200000 IJ SOLN
INTRAMUSCULAR | Status: AC
  Filled 2023-07-10: qty 30

## 2023-07-10 MED ORDER — POVIDONE-IODINE 10 % EX SWAB
2.0000 | Freq: Once | CUTANEOUS | Status: AC
  Administered 2023-07-10: 2 via TOPICAL

## 2023-07-10 MED ORDER — CHLORHEXIDINE GLUCONATE 4 % EX SOLN: 60.0000 mL | Freq: Once | CUTANEOUS | Status: DC

## 2023-07-10 MED ORDER — CHLORHEXIDINE GLUCONATE 0.12 % MT SOLN: 15.0000 mL | Freq: Once | OROMUCOSAL | Status: AC

## 2023-07-10 MED ORDER — ONDANSETRON HCL 4 MG/2ML IJ SOLN
INTRAMUSCULAR | Status: AC
  Filled 2023-07-10: qty 2

## 2023-07-10 MED ORDER — CHLORHEXIDINE GLUCONATE 0.12 % MT SOLN: 15.0000 mL | Freq: Once | OROMUCOSAL | Status: DC

## 2023-07-10 MED ORDER — CEFAZOLIN SODIUM-DEXTROSE 2-4 GM/100ML-% IV SOLN
INTRAVENOUS | Status: AC
  Filled 2023-07-10: qty 100

## 2023-07-10 MED ORDER — METOCLOPRAMIDE HCL 10 MG PO TABS: 5.0000 mg | ORAL_TABLET | Freq: Three times a day (TID) | ORAL | Status: DC | PRN

## 2023-07-10 MED ORDER — HYDROCODONE-ACETAMINOPHEN 5-325 MG PO TABS
1.0000 | ORAL_TABLET | Freq: Once | ORAL | Status: AC
  Administered 2023-07-10: 1 via ORAL

## 2023-07-10 MED ORDER — ACETAMINOPHEN 500 MG PO TABS
1000.0000 mg | ORAL_TABLET | Freq: Four times a day (QID) | ORAL | Status: AC
  Administered 2023-07-10 – 2023-07-11 (×4): 1000 mg via ORAL
  Filled 2023-07-10 (×4): qty 2

## 2023-07-10 MED ORDER — SODIUM CHLORIDE 0.9 % IV SOLN: INTRAVENOUS | Status: AC

## 2023-07-10 MED ORDER — CEFAZOLIN SODIUM-DEXTROSE 2-4 GM/100ML-% IV SOLN
2.0000 g | INTRAVENOUS | Status: AC
  Administered 2023-07-10: 2 g via INTRAVENOUS

## 2023-07-10 MED ORDER — ACETAMINOPHEN 325 MG PO TABS: 325.0000 mg | ORAL_TABLET | Freq: Four times a day (QID) | ORAL | Status: DC | PRN

## 2023-07-10 MED ORDER — FENTANYL CITRATE (PF) 100 MCG/2ML IJ SOLN
INTRAMUSCULAR | Status: DC | PRN
  Administered 2023-07-10: 50 ug via INTRAVENOUS

## 2023-07-10 MED ORDER — METOCLOPRAMIDE HCL 5 MG/ML IJ SOLN
10.0000 mg | Freq: Once | INTRAMUSCULAR | Status: AC
  Administered 2023-07-10: 10 mg via INTRAVENOUS

## 2023-07-10 MED ORDER — ORAL CARE MOUTH RINSE: 15.0000 mL | Freq: Once | OROMUCOSAL | Status: DC

## 2023-07-10 MED ORDER — EPHEDRINE 5 MG/ML INJ
INTRAVENOUS | Status: AC
  Filled 2023-07-10: qty 5

## 2023-07-10 MED ORDER — ONDANSETRON HCL 4 MG/2ML IJ SOLN
INTRAMUSCULAR | Status: DC | PRN
  Administered 2023-07-10: 4 mg via INTRAVENOUS

## 2023-07-10 MED ORDER — PHENOL 1.4 % MT LIQD: 1.0000 | OROMUCOSAL | Status: DC | PRN

## 2023-07-10 MED ORDER — CEFAZOLIN SODIUM-DEXTROSE 2-4 GM/100ML-% IV SOLN
2.0000 g | Freq: Four times a day (QID) | INTRAVENOUS | Status: AC
  Administered 2023-07-10 – 2023-07-11 (×2): 2 g via INTRAVENOUS
  Filled 2023-07-10 (×2): qty 100

## 2023-07-10 MED ORDER — HYDROMORPHONE HCL 2 MG PO TABS
2.0000 mg | ORAL_TABLET | ORAL | Status: DC | PRN
  Administered 2023-07-10 – 2023-07-11 (×2): 2 mg via ORAL
  Filled 2023-07-10 (×2): qty 1

## 2023-07-10 MED ORDER — APIXABAN 5 MG PO TABS
5.0000 mg | ORAL_TABLET | Freq: Two times a day (BID) | ORAL | Status: DC
  Administered 2023-07-12: 5 mg via ORAL
  Filled 2023-07-10: qty 1

## 2023-07-10 MED ORDER — PHENYLEPHRINE HCL (PRESSORS) 10 MG/ML IV SOLN
INTRAVENOUS | Status: DC | PRN
Start: 2023-07-10 — End: 2023-07-10
  Administered 2023-07-10 (×3): 80 ug via INTRAVENOUS

## 2023-07-10 MED ORDER — METHOCARBAMOL 1000 MG/10ML IJ SOLN: 500.0000 mg | Freq: Four times a day (QID) | INTRAVENOUS | Status: DC | PRN

## 2023-07-10 MED ORDER — PROPOFOL 10 MG/ML IV BOLUS
INTRAVENOUS | Status: AC
  Filled 2023-07-10: qty 20

## 2023-07-10 MED ORDER — LACTATED RINGERS IV SOLN: INTRAVENOUS | Status: DC

## 2023-07-10 MED ORDER — METOCLOPRAMIDE HCL 5 MG/ML IJ SOLN: 5.0000 mg | Freq: Three times a day (TID) | INTRAMUSCULAR | Status: DC | PRN

## 2023-07-10 MED ORDER — TRANEXAMIC ACID-NACL 1000-0.7 MG/100ML-% IV SOLN
1000.0000 mg | INTRAVENOUS | Status: AC
  Administered 2023-07-10: 1000 mg via INTRAVENOUS
  Filled 2023-07-10: qty 100

## 2023-07-10 MED ORDER — DOCUSATE SODIUM 100 MG PO CAPS
100.0000 mg | ORAL_CAPSULE | Freq: Two times a day (BID) | ORAL | Status: DC
  Administered 2023-07-10 – 2023-07-12 (×4): 100 mg via ORAL
  Filled 2023-07-10 (×4): qty 1

## 2023-07-10 MED ORDER — TRANEXAMIC ACID-NACL 1000-0.7 MG/100ML-% IV SOLN
1000.0000 mg | Freq: Once | INTRAVENOUS | Status: AC
  Administered 2023-07-10: 1000 mg via INTRAVENOUS
  Filled 2023-07-10: qty 100

## 2023-07-10 MED ORDER — FENTANYL CITRATE (PF) 100 MCG/2ML IJ SOLN
INTRAMUSCULAR | Status: AC
  Filled 2023-07-10: qty 2

## 2023-07-10 MED ORDER — BUPIVACAINE-EPINEPHRINE (PF) 0.5% -1:200000 IJ SOLN
INTRAMUSCULAR | Status: DC | PRN
  Administered 2023-07-10: 30 mL via PERINEURAL

## 2023-07-10 MED ORDER — FENTANYL CITRATE PF 50 MCG/ML IJ SOSY
25.0000 ug | PREFILLED_SYRINGE | INTRAMUSCULAR | Status: DC | PRN
  Administered 2023-07-10 (×3): 50 ug via INTRAVENOUS
  Filled 2023-07-10 (×3): qty 1

## 2023-07-10 MED ORDER — METOCLOPRAMIDE HCL 5 MG/ML IJ SOLN
INTRAMUSCULAR | Status: AC
  Filled 2023-07-10: qty 2

## 2023-07-10 MED ORDER — OXYCODONE HCL 5 MG PO TABS
5.0000 mg | ORAL_TABLET | ORAL | Status: DC | PRN
  Administered 2023-07-10 – 2023-07-11 (×2): 10 mg via ORAL
  Filled 2023-07-10 (×2): qty 2

## 2023-07-10 SURGICAL SUPPLY — 54 items
APL PRP STRL LF DISP 70% ISPRP (MISCELLANEOUS) ×1
BAG HAMPER (MISCELLANEOUS) ×1 IMPLANT
BIT DRILL 4.8X300 (BIT) ×1 IMPLANT
BLADE HEX COATED 2.75 (ELECTRODE) ×1 IMPLANT
BLADE SURG SZ10 CARB STEEL (BLADE) ×2 IMPLANT
BNDG GAUZE ELAST 4 BULKY (GAUZE/BANDAGES/DRESSINGS) ×1 IMPLANT
CHLORAPREP W/TINT 26 (MISCELLANEOUS) ×1 IMPLANT
CLOTH BEACON ORANGE TIMEOUT ST (SAFETY) ×1 IMPLANT
COVER LIGHT HANDLE STERIS (MISCELLANEOUS) ×2 IMPLANT
COVER MAYO STAND XLG (MISCELLANEOUS) IMPLANT
COVER PERINEAL POST (MISCELLANEOUS) ×1 IMPLANT
DECANTER SPIKE VIAL GLASS SM (MISCELLANEOUS) ×2 IMPLANT
DRAPE STERI IOBAN 125X83 (DRAPES) ×1 IMPLANT
DRESSING MEPILEX BORDER 6X8 (GAUZE/BANDAGES/DRESSINGS) ×1 IMPLANT
DRSG MEPILEX BORDER 6X8 (GAUZE/BANDAGES/DRESSINGS) ×1
DRSG MEPILEX SACRM 8.7X9.8 (GAUZE/BANDAGES/DRESSINGS) ×1 IMPLANT
GLOVE BIOGEL PI IND STRL 7.0 (GLOVE) ×2 IMPLANT
GLOVE SKINSENSE STRL SZ8.0 LF (GLOVE) IMPLANT
GLOVE SS N UNI LF 8.5 STRL (GLOVE) ×1 IMPLANT
GLOVE SURG POLYISO LF SZ8 (GLOVE) ×2 IMPLANT
GOWN STRL REUS W/TWL LRG LVL3 (GOWN DISPOSABLE) ×1 IMPLANT
GOWN STRL REUS W/TWL XL LVL3 (GOWN DISPOSABLE) ×1 IMPLANT
INST SET MAJOR BONE (KITS) ×1 IMPLANT
KIT BLADEGUARD II DBL (SET/KITS/TRAYS/PACK) ×1 IMPLANT
KIT TURNOVER CYSTO (KITS) ×1 IMPLANT
MANIFOLD NEPTUNE II (INSTRUMENTS) ×1 IMPLANT
MARKER SKIN DUAL TIP RULER LAB (MISCELLANEOUS) ×1 IMPLANT
NDL HYPO 21X1.5 SAFETY (NEEDLE) ×1 IMPLANT
NDL SPNL 18GX3.5 QUINCKE PK (NEEDLE) ×1 IMPLANT
NEEDLE HYPO 21X1.5 SAFETY (NEEDLE) ×1 IMPLANT
NEEDLE SPNL 18GX3.5 QUINCKE PK (NEEDLE) ×1 IMPLANT
NS IRRIG 1000ML POUR BTL (IV SOLUTION) ×1 IMPLANT
PACK BASIC III (CUSTOM PROCEDURE TRAY) ×1
PACK SRG BSC III STRL LF ECLPS (CUSTOM PROCEDURE TRAY) ×1 IMPLANT
PAD ABD 5X9 TENDERSORB (GAUZE/BANDAGES/DRESSINGS) ×1 IMPLANT
PENCIL SMOKE EVACUATOR COATED (MISCELLANEOUS) ×1 IMPLANT
PIN GUIDE DRILL TIP 2.8X300 (DRILL) ×3 IMPLANT
POSITIONER HEAD 8X9X4 ADT (SOFTGOODS) ×1 IMPLANT
SCREW CANN 6.5X85 TMAX FULL TH (Screw) IMPLANT
SCREW CANN FULL THD 6.5X90 (Screw) IMPLANT
SCREW CANNULATED 6.5X95 HIP (Screw) IMPLANT
SET BASIN LINEN APH (SET/KITS/TRAYS/PACK) ×1 IMPLANT
SPONGE T-LAP 18X18 ~~LOC~~+RFID (SPONGE) ×2 IMPLANT
STAPLER VISISTAT 35W (STAPLE) IMPLANT
STRIP CLOSURE SKIN 1/2X4 (GAUZE/BANDAGES/DRESSINGS) IMPLANT
SUT BRALON NAB BRD #1 30IN (SUTURE) ×1 IMPLANT
SUT MNCRL 0 VIOLET CTX 36 (SUTURE) ×1 IMPLANT
SUT MON AB 2-0 CT1 36 (SUTURE) IMPLANT
SYR 30ML LL (SYRINGE) ×1 IMPLANT
SYR BULB IRRIG 60ML STRL (SYRINGE) ×2 IMPLANT
TRAY FOLEY W/BAG SLVR 16FR (SET/KITS/TRAYS/PACK) ×1
TRAY FOLEY W/BAG SLVR 16FR ST (SET/KITS/TRAYS/PACK) ×1 IMPLANT
WASHER FLAT 6.5MM (Washer) IMPLANT
YANKAUER SUCT BULB TIP 10FT TU (MISCELLANEOUS) ×1 IMPLANT

## 2023-07-10 NOTE — Progress Notes (Signed)
   07/10/23 1527  Vitals  Temp 98.6 F (37 C)  Temp Source Oral  BP (!) 144/80  MAP (mmHg) 98  BP Location Right Arm  BP Method Automatic  Patient Position (if appropriate) Lying  Pulse Rate 80  Pulse Rate Source Monitor  Resp 10  Level of Consciousness  Level of Consciousness Alert  Oxygen Therapy  SpO2 98 %  O2 Device Nasal Cannula  O2 Flow Rate (L/min) 2.5 L/min  Pain Assessment  Pain Scale 0-10  Pain Score 3   PT arrived back to the unit dressing is clean dry and intact. PT rates pain at a 3.

## 2023-07-10 NOTE — Progress Notes (Addendum)
PROGRESS NOTE   Tanya Mueller, is a 88 y.o. female, DOB - 1935/08/04, ZOX:[id number]  Admit date - 07/09/2023   Admitting Physician Nevia Henkin Mariea Clonts, MD  Outpatient Primary MD for the patient is Sasser, Clarene Critchley, MD  LOS - 1  Chief Complaint  Patient presents with   Fall       Brief Narrative:   88 y.o. female with pmhx relevant for chronic back pain/prior back surgeries, chronic opiate use, with h/o opioid-induced constipation, nephrolithiasis and HTN, history of paroxysmal A-fib currently on Eliquis for stroke prevention who presents from home after mechanical fall with Rt hip pain and is found to have left hip fracture  S/p ORIF on 07/10/23    -Assessment and Plan: 1)Rt Hip Fx--suspect mechanical fall Right hip CT shows---Acute right subcapital femoral head/neck fracture with mild foreshortening. The right femoral head is seated within the acetabulum. S/p PERCUTANEOUS FIXATION OF FEMORAL NECK on 07/10/23 Weightbearing as tolerated with a walker for 6 weeks Immediate range of motion and out of bed Follow-up in 4 weeks for x-rays No staples wound can be checked by nursing staff daily Resume Eliquis on Thursday 07/12/23 am dose. -Awaiting physical therapy eval  2)Leukocytosis--- previously 11.7 probably reactive in the setting of mechanical fall with hip fracture - 3) chronic back pain/prior back surgeries--- --Lumbar x-rays show Postsurgical changes L2-S1 posterior lumbosacral fusion. No acute fracture or hardware complicating feature is identified,  -Continue pain control with opiates   4)H/o opioid-induced constipation--- due to #3 above- -continue laxatives   5)PAFib-history of paroxysmal A-fib -Eliquis on hold to allow for hip surgery -May use Cardizem as needed elevated HR --Last dose of Eliquis was 6 AM on 07/08/2023 -Resume Eliquis on Thursday 07/12/23 am dose. -EKG sinus rhythm with prolonged QT and premature atrial contractions   6)HTN--- continue losartan   Cardizem as needed elevated -IV labetalol as needed elevated BP   Status is: Inpatient   Disposition: The patient is from: Home              Anticipated d/c is to: SNF              Anticipated d/c date is: 1 day              Patient currently is medically stable to d/c. Barriers: Awaiting PT eval, SNF bed and insurance authorization to go to SNF  Code Status :  -  Code Status: Full Code   Family Communication:     (patient is alert, awake and coherent)  Discussed with Daughter at bedside  DVT Prophylaxis  :   - SCDs  SCDs Start: 07/10/23 1637 Place TED hose Start: 07/10/23 1637 SCDs Start: 07/09/23 1442 Place TED hose Start: 07/09/23 1442  Lab Results  Component Value Date   PLT 317 07/09/2023   Inpatient Medications  Scheduled Meds:  acetaminophen  1,000 mg Oral Q6H   docusate sodium  100 mg Oral BID   losartan  50 mg Oral Daily   methocarbamol  750 mg Oral TID   mupirocin ointment  1 Application Nasal BID   polyethylene glycol  17 g Oral BID   sertraline  75 mg Oral Daily   sodium chloride flush  3 mL Intravenous Q12H   sodium chloride flush  3 mL Intravenous Q12H   Continuous Infusions:  sodium chloride 75 mL/hr at 07/10/23 1723    ceFAZolin (ANCEF) IV 2 g (07/10/23 1724)   methocarbamol (ROBAXIN) IV     PRN Meds:.[START ON 07/11/2023]  acetaminophen, bisacodyl, diltiazem, HYDROmorphone (DILAUDID) injection, HYDROmorphone, labetalol, menthol-cetylpyridinium **OR** phenol, methocarbamol **OR** methocarbamol (ROBAXIN) IV, metoCLOPramide **OR** metoCLOPramide (REGLAN) injection, oxyCODONE, sodium chloride flush, traZODone   Anti-infectives (From admission, onward)    Start     Dose/Rate Route Frequency Ordered Stop   07/10/23 1800  ceFAZolin (ANCEF) IVPB 2g/100 mL premix        2 g 200 mL/hr over 30 Minutes Intravenous Every 6 hours 07/10/23 1636 07/11/23 0559   07/10/23 1115  ceFAZolin (ANCEF) IVPB 2g/100 mL premix        2 g 200 mL/hr over 30 Minutes Intravenous  On call to O.R. 07/10/23 1026 07/10/23 1245   07/10/23 1048  ceFAZolin (ANCEF) 2-4 GM/100ML-% IVPB       Note to Pharmacy: Montel Clock E: cabinet override      07/10/23 1048 07/10/23 1327      Subjective: Tanya Mueller today has no fevers, no emesis,  No chest pain,   - Daughter at bedside, questions answered -Resting comfortably postsurgery   Objective: Vitals:   07/10/23 1445 07/10/23 1500 07/10/23 1515 07/10/23 1527  BP: (!) 173/69 (!) 167/85 (!) 140/119 (!) 144/80  Pulse: 82 84 88 80  Resp: 14 14 12 10   Temp:    98.6 F (37 C)  TempSrc:    Oral  SpO2: 97% 97% 96% 98%  Weight:      Height:        Intake/Output Summary (Last 24 hours) at 07/10/2023 1843 Last data filed at 07/10/2023 1507 Gross per 24 hour  Intake 710 ml  Output 160 ml  Net 550 ml   Filed Weights   07/09/23 1731 07/10/23 1043  Weight: 59.7 kg 59.7 kg    Physical Exam  Gen:- Awake Alert, in no acute distress HEENT:- .AT, No sclera icterus Neck-Supple Neck,No JVD,.  Lungs-  CTAB , fair symmetrical air movement CV- S1, S2 normal, regular  Abd-  +ve B.Sounds, Abd Soft, No tenderness,    Extremity/Skin:- No  edema, pedal pulses present  Psych-affect is appropriate, oriented x3 Neuro-no new focal deficits, no tremors MSK--- right hip postop wound is hemostatic  Data Reviewed: I have personally reviewed following labs and imaging studies  CBC: Recent Labs  Lab 07/09/23 1248  WBC 11.7*  NEUTROABS 9.5*  HGB 13.2  HCT 38.9  MCV 103.2*  PLT 317   Basic Metabolic Panel: Recent Labs  Lab 07/09/23 1248  NA 141  K 3.6  CL 103  CO2 26  GLUCOSE 122*  BUN 10  CREATININE 0.66  CALCIUM 9.1   GFR: Estimated Creatinine Clearance: 45.4 mL/min (by C-G formula based on SCr of 0.66 mg/dL).  Recent Results (from the past 240 hour(s))  Surgical PCR screen     Status: None   Collection Time: 07/09/23  6:34 PM   Specimen: Nasal Mucosa; Nasal Swab  Result Value Ref Range Status   MRSA, PCR  NEGATIVE NEGATIVE Final   Staphylococcus aureus NEGATIVE NEGATIVE Final    Comment: (NOTE) The Xpert SA Assay (FDA approved for NASAL specimens in patients 36 years of age and older), is one component of a comprehensive surveillance program. It is not intended to diagnose infection nor to guide or monitor treatment. Performed at [facility], [address], [place], [place] [address]       Radiology Studies: DG HIP UNILAT WITH PELVIS 2-3 VIEWS RIGHT  Result Date: 07/10/2023 CLINICAL DATA:  Fracture EXAM: DG HIP (WITH OR WITHOUT PELVIS) 2-3V RIGHT COMPARISON:  the  previous day's study FINDINGS: A series of fluoroscopic intraoperative images. The subcapital fracture seen on prior imaging less conspicuous. Images document placement of 3 pins across the femoral neck. Near anatomic alignment. IMPRESSION: Instrumented fixation right femoral neck fracture. Electronically Signed   By: Corlis Leak M.D.   On: 07/10/2023 17:41   DG C-Arm 1-60 Min  Result Date: 07/10/2023 CLINICAL DATA:  Hip pain EXAM: DG C-ARM 1-60 MIN COMPARISON:  CT from previous day FINDINGS: Minimally displaced subcapital femoral fracture is less conspicuous on these fluoroscopic spot images, which document placement of 2 orthopedic pins across the femoral neck. IMPRESSION: Orthopedic fixation of subcapital femoral fracture. Electronically Signed   By: Corlis Leak M.D.   On: 07/10/2023 17:39   DG HIP UNILAT WITH PELVIS 2-3 VIEWS RIGHT  Result Date: 07/10/2023 CLINICAL DATA:  Status post hardware fixation of the right femoral neck fracture. EXAM: DG HIP (WITH OR WITHOUT PELVIS) 2-3V RIGHT COMPARISON:  Right hip CT dated 07/09/2023. FINDINGS: Inner or placement of 3 screws bridging the right femoral neck fracture with anatomic position and alignment of the major fragments in the frontal projection, difficult to visualize in the lateral projection due to overlying soft tissues. Diffuse osteopenia. Lumbar spine fixation hardware.  IMPRESSION: Status post internal fixation of the right femoral neck fracture with anatomic position and alignment of the major fragments in the frontal projection. Electronically Signed   By: Beckie Salts M.D.   On: 07/10/2023 15:57   CT Hip Right Wo Contrast  Result Date: 07/09/2023 CLINICAL DATA:  Fracture, hip EXAM: CT OF THE RIGHT HIP WITHOUT CONTRAST TECHNIQUE: Multidetector CT imaging of the right hip was performed according to the standard protocol. Multiplanar CT image reconstructions were also generated. RADIATION DOSE REDUCTION: This exam was performed according to the departmental dose-optimization program which includes automated exposure control, adjustment of the mA and/or kV according to patient size and/or use of iterative reconstruction technique. COMPARISON:  Same-day radiograph of the right hip at 11:27 a.m. FINDINGS: Bones/Joint/Cartilage There is an acute fracture of the right subcapital femoral head/neck with mild foreshortening. The right femoral head is seated within the acetabulum. No additional acute fracture identified. Remote healed right inferior pubic ramus fracture. The right sacroiliac joint and pubic symphysis are anatomically aligned. Degenerative changes of the right hip. Chondrocalcinosis of the pubic symphysis and right hip. Partially visualized lumbosacral fusion hardware. Ligaments Suboptimally assessed by CT. Soft tissues/Muscles. Soft tissue swelling overlying the right lateral hip is noted. No discrete fluid collection or hematoma identified. No enlarged lymph nodes identified in the field of view. The visualized intra-abdominal contents are unremarkable. IMPRESSION: 1. Acute right subcapital femoral head/neck fracture with mild foreshortening. The right femoral head is seated within the acetabulum. 2. Remote healed right inferior pubic ramus fracture. Electronically Signed   By: Hart Robinsons M.D.   On: 07/09/2023 16:35   DG HIP UNILAT WITH PELVIS 2-3 VIEWS  RIGHT  Result Date: 07/09/2023 CLINICAL DATA:  144615 Pain 144615 EXAM: DG HIP (WITH OR WITHOUT PELVIS) 2-3V RIGHT COMPARISON:  None Available. FINDINGS: There is subtle cortical irregularity along the medial border of the right femoral neck, along with relative foreshortening of the femoral neck relative to the contralateral side. Advanced osteopenia. Soft tissues unremarkable. IMPRESSION: Subtle cortical irregularity and foreshortening of the right femoral neck raises suspicion for impacted/displaced fracture. Further evaluation with cross-sectional imaging is advised. Electronically Signed   By: Olive Bass M.D.   On: 07/09/2023 13:23   DG Lumbar Spine Complete  Result Date: 07/09/2023 CLINICAL DATA:  fall EXAM: LUMBAR SPINE - COMPLETE 4+ VIEW COMPARISON:  None Available. FINDINGS: Status post L2-S1 posterior lumbosacral fusion. Hardware alignment appears grossly within normal limits findings of hardware fracture. No compression deformity identified. Advanced osteopenia. Paravertebral soft tissues are IMPRESSION: Postsurgical changes L2-S1 posterior lumbosacral fusion. No acute fracture or hardware complicating feature is identified, exam is somewhat limited advanced osteopenia. Consider CT for further evaluation if appropriate. Electronically Signed   By: Olive Bass M.D.   On: 07/09/2023 13:17   CT Head Wo Contrast  Result Date: 07/09/2023 CLINICAL DATA:  Neck trauma.  Fall.  On blood thinner. EXAM: CT HEAD WITHOUT CONTRAST CT CERVICAL SPINE WITHOUT CONTRAST TECHNIQUE: Multidetector CT imaging of the head and cervical spine was performed following the standard protocol without intravenous contrast. Multiplanar CT image reconstructions of the cervical spine were also generated. RADIATION DOSE REDUCTION: This exam was performed according to the departmental dose-optimization program which includes automated exposure control, adjustment of the mA and/or kV according to patient size and/or use of  iterative reconstruction technique. COMPARISON:  None Available. FINDINGS: CT HEAD FINDINGS Brain: No evidence of acute infarction, hemorrhage, hydrocephalus, extra-axial collection or mass lesion/mass effect. Vascular: No hyperdense vessel or unexpected calcification. Skull: Normal. Negative for fracture or focal lesion. Sinuses/Orbits: No evidence of injury. Mild mucosal thickening in the paranasal sinuses. CT CERVICAL SPINE FINDINGS Alignment: Normal. Skull base and vertebrae: No acute fracture. No primary bone lesion or focal pathologic process. Soft tissues and spinal canal: No prevertebral fluid or swelling. No visible canal hematoma. Goiter Disc levels:  Much less than typical degenerative change for age. Upper chest: No evidence of injury IMPRESSION: No evidence of intracranial or cervical spine injury. Electronically Signed   By: Tiburcio Pea M.D.   On: 07/09/2023 12:06   CT Cervical Spine Wo Contrast  Result Date: 07/09/2023 CLINICAL DATA:  Neck trauma.  Fall.  On blood thinner. EXAM: CT HEAD WITHOUT CONTRAST CT CERVICAL SPINE WITHOUT CONTRAST TECHNIQUE: Multidetector CT imaging of the head and cervical spine was performed following the standard protocol without intravenous contrast. Multiplanar CT image reconstructions of the cervical spine were also generated. RADIATION DOSE REDUCTION: This exam was performed according to the departmental dose-optimization program which includes automated exposure control, adjustment of the mA and/or kV according to patient size and/or use of iterative reconstruction technique. COMPARISON:  None Available. FINDINGS: CT HEAD FINDINGS Brain: No evidence of acute infarction, hemorrhage, hydrocephalus, extra-axial collection or mass lesion/mass effect. Vascular: No hyperdense vessel or unexpected calcification. Skull: Normal. Negative for fracture or focal lesion. Sinuses/Orbits: No evidence of injury. Mild mucosal thickening in the paranasal sinuses. CT CERVICAL  SPINE FINDINGS Alignment: Normal. Skull base and vertebrae: No acute fracture. No primary bone lesion or focal pathologic process. Soft tissues and spinal canal: No prevertebral fluid or swelling. No visible canal hematoma. Goiter Disc levels:  Much less than typical degenerative change for age. Upper chest: No evidence of injury IMPRESSION: No evidence of intracranial or cervical spine injury. Electronically Signed   By: Tiburcio Pea M.D.   On: 07/09/2023 12:06    Scheduled Meds:  acetaminophen  1,000 mg Oral Q6H   docusate sodium  100 mg Oral BID   losartan  50 mg Oral Daily   methocarbamol  750 mg Oral TID   mupirocin ointment  1 Application Nasal BID   polyethylene glycol  17 g Oral BID   sertraline  75 mg Oral Daily   sodium chloride flush  3 mL Intravenous Q12H   sodium chloride flush  3 mL Intravenous Q12H   Continuous Infusions:  sodium chloride 75 mL/hr at 07/10/23 1723    ceFAZolin (ANCEF) IV 2 g (07/10/23 1724)   methocarbamol (ROBAXIN) IV      LOS: 1 day   Shon Hale M.D on 07/10/2023 at 6:43 PM  Go to [email or web address] - for contact info  [facility] - Office  [phone]  If 7PM-7AM, please contact night-coverage [email or web address] 07/10/2023, 6:43 PM

## 2023-07-10 NOTE — Op Note (Addendum)
Orthopaedic Surgery Operative Note (CSN: [id number])  Tanya Mueller  09-18-1935 Date of Surgery: 07/10/2023   * No implants in log *  Tranexamic acid was used 2:00 PM  PATIENT:  Tanya Mueller  88 y.o. female  PRE-OPERATIVE DIAGNOSIS:  right hip fracture  POST-OPERATIVE DIAGNOSIS:  right hip fracture  PROCEDURE:  Procedure(s) with comments: PERCUTANEOUS FIXATION OF FEMORAL NECK (Right) - c arm  Surgical findings Subcapital valgus impacted right femoral neck fracture  Details of procedure  Tashi Melle was evaluated in the preop area cleared for surgery.  The surgical site was confirmed and marked his right hip.  Implant check was performed as well as review of imaging.  The patient was taken to the operating for general anesthesia and then placed on the fracture table with a perineal post.  The left leg was placed in a traction boot as well and abducted.  The right leg was placed in traction and internally rotated  The C-arm was brought in and images were obtained in AP and lateral plane with confirmation of valgus impacted fracture amenable to pinning/screw fixation  After sterile prep and drape, timeout was executed and completed  An 18-gauge needle was used outlined the proximal femoral bone the incision was carried out from the greater trochanter distally approximately 5 cm  Subcutaneous tissue was divided fascia was split in line with the skin incision and then an L-shaped incision was made in the vastus fascia and subperiosteal dissection exposed the proximal femur  The AO drill guide was used along with the C arm to place an inferior center pin and this was confirmed by x-ray  The multipin drill guide was then passed over the inferior pin into additional pins were placed superiorly this was confirmed by x-ray  Each pin was measured and then overdrilled cortex only and then 2 screws with washer and 1 screw without washer were placed over the pins  The inferior pin was a  partially-threaded screw the 2 superior pins were fully threaded screws  Images were obtained.  Pin placement was excellent The wound was irrigated and closed with 0 Monocryl in 3 layers and 2-0 Monocryl in 1 layer  We injected 30 cc prior to fascial closure under the fascia  SURGEON:  Surgeons and Role:    * Vickki Hearing, MD - Primary  PHYSICIAN ASSISTANT:   ASSISTANTS: none   ANESTHESIA:   general  EBL:  10 mL   BLOOD ADMINISTERED:none  DRAINS: none   LOCAL MEDICATIONS USED:  MARCAINE     SPECIMEN:  No Specimen  DISPOSITION OF SPECIMEN:  N/A  COUNTS:  YES  TOURNIQUET:  * No tourniquets in log *  DICTATION: .Dragon Dictation  PLAN OF CARE: Admit to inpatient   PATIENT DISPOSITION:  PACU - hemodynamically stable.   Delay start of Pharmacological VTE agent (>24hrs) due to surgical blood loss or risk of bleeding: yes  Plan postop  Weightbearing as tolerated with a walker for 6 weeks Immediate range of motion and out of bed Follow-up in 4 weeks for x-rays No staples wound can be checked by nursing staff daily Resume Eliquis on Thursday 1010 at 0600 hrs.

## 2023-07-10 NOTE — Interval H&P Note (Signed)
History and Physical Interval Note:  07/10/2023 12:14 PM  Tanya Mueller  has presented today for surgery, with the diagnosis of right hip fracture.  The various methods of treatment have been discussed with the patient and family. After consideration of risks, benefits and other options for treatment, the patient has consented to  Procedure(s) with comments: PERCUTANEOUS FIXATION OF FEMORAL NECK (Right) - c arm as a surgical intervention.  The patient's history has been reviewed, patient examined, no change in status, stable for surgery.  I have reviewed the patient's chart and labs.  Questions were answered to the patient's satisfaction.     Fuller Canada

## 2023-07-10 NOTE — Brief Op Note (Addendum)
07/10/2023  2:00 PM  PATIENT:  Tanya Mueller  88 y.o. female  PRE-OPERATIVE DIAGNOSIS:  right hip fracture  POST-OPERATIVE DIAGNOSIS:  right hip fracture  PROCEDURE:  Procedure(s) with comments: PERCUTANEOUS FIXATION OF FEMORAL NECK (Right) - c arm  Surgical findings Subcapital valgus impacted right femoral neck fracture  Details of procedure  Tobey Vento was evaluated in the preop area cleared for surgery.  The surgical site was confirmed and marked his right hip.  Implant check was performed as well as review of imaging.  The patient was taken to the operating for general anesthesia and then placed on the fracture table with a perineal post.  The left leg was placed in a traction boot as well and abducted.  The right leg was placed in traction and internally rotated  The C-arm was brought in and images were obtained in AP and lateral plane with confirmation of valgus impacted fracture amenable to pinning/screw fixation  After sterile prep and drape, timeout was executed and completed  An 18-gauge needle was used outlined the proximal femoral bone the incision was carried out from the greater trochanter distally approximately 5 cm  Subcutaneous tissue was divided fascia was split in line with the skin incision and then an L-shaped incision was made in the vastus fascia and subperiosteal dissection exposed the proximal femur  The AO drill guide was used along with the C arm to place an inferior center pin and this was confirmed by x-ray  The multipin drill guide was then passed over the inferior pin into additional pins were placed superiorly this was confirmed by x-ray  Each pin was measured and then overdrilled cortex only and then 2 screws with washer and 1 screw without washer were placed over the pins  The inferior pin was a partially-threaded screw the 2 superior pins were fully threaded screws  Images were obtained.  Pin placement was excellent The wound was irrigated and  closed with 0 Monocryl in 3 layers and 2-0 Monocryl in 1 layer  We injected 30 cc prior to fascial closure under the fascia  SURGEON:  Surgeons and Role:    * Vickki Hearing, MD - Primary  PHYSICIAN ASSISTANT:   ASSISTANTS: none   ANESTHESIA:   general  EBL:  10 mL   BLOOD ADMINISTERED:none  DRAINS: none   LOCAL MEDICATIONS USED:  MARCAINE     SPECIMEN:  No Specimen  DISPOSITION OF SPECIMEN:  N/A  COUNTS:  YES  TOURNIQUET:  * No tourniquets in log *  DICTATION: .Dragon Dictation  PLAN OF CARE: Admit to inpatient   PATIENT DISPOSITION:  PACU - hemodynamically stable.   Delay start of Pharmacological VTE agent (>24hrs) due to surgical blood loss or risk of bleeding: yes  Plan postop  Weightbearing as tolerated with a walker for 6 weeks Immediate range of motion and out of bed Follow-up in 4 weeks for x-rays No staples wound can be checked by nursing staff daily Resume Eliquis on Thursday 1010 at 0600 hrs.

## 2023-07-10 NOTE — TOC Initial Note (Signed)
Transition of Care [facility]) - Initial/Assessment Note    Patient Details  Name: Tanya Mueller MRN: [id number] Date of Birth: [birth year]  Transition of Care [facility]) CM/SW Contact:    Villa Herb, LCSWA Phone Number: 07/10/2023, 1:47 PM  Clinical Narrative:                 Pt arrived to hospital with hip fracture and need for surgical intervention. CSW spoke with pts daughter to complete initial assessment. Pt lives with her spouse. Pts daughter also lives close by and visits daily. Pt has a walker and cane to use if needed. CSW spoke about discharge planning. CSW explained that PT will eval pt and TOC will follow with PT recommendations. TOC to follow.   Expected Discharge Plan:  (decision to be made once PT has worked with pt.) Barriers to Discharge: Continued Medical Work up   Patient Goals and CMS Choice Patient states their goals for this hospitalization and ongoing recovery are:: get better CMS Medicare.gov Compare Post Acute Care list provided to:: Patient Choice offered to / list presented to : Patient, Adult Children [place] ownership interest in [facility]provided to:: Adult Children    Expected Discharge Plan and Services In-house Referral: Clinical Social Work Discharge Planning Services: CM Consult   Living arrangements for the past 2 months: Single Family Home                                      Prior Living Arrangements/Services Living arrangements for the past 2 months: Single Family Home Lives with:: Spouse Patient language and need for interpreter reviewed:: Yes Do you feel safe going back to the place where you live?: Yes      Need for Family Participation in Patient Care: Yes (Comment) Care giver support system in place?: Yes (comment) Current home services: DME Criminal Activity/Legal Involvement Pertinent to Current Situation/Hospitalization: No - Comment as needed  Activities of Daily Living   ADL Screening (condition at time of  admission) Independently performs ADLs?: No Does the patient have a NEW difficulty with bathing/dressing/toileting/self-feeding that is expected to last >3 days?: Yes (Initiates electronic notice to provider for possible OT consult) Does the patient have a NEW difficulty with getting in/out of bed, walking, or climbing stairs that is expected to last >3 days?: Yes (Initiates electronic notice to provider for possible PT consult) Does the patient have a NEW difficulty with communication that is expected to last >3 days?: No Is the patient deaf or have difficulty hearing?: No Does the patient have difficulty seeing, even when wearing glasses/contacts?: No Does the patient have difficulty concentrating, remembering, or making decisions?: No  Permission Sought/Granted                  Emotional Assessment         Alcohol / Substance Use: Not Applicable Psych Involvement: No (comment)  Admission diagnosis:  Closed fracture of right hip, initial encounter (HCC) [S72.001A] Hip pain, acute, right [M25.551] Patient Active Problem List   Diagnosis Date Noted   Hip pain, acute, right 07/09/2023   Acquired dilation of right ventricle of heart 03/19/2023   (HFpEF) heart failure with preserved ejection fraction (HCC) 03/19/2023   Acute respiratory failure with hypoxia (HCC) 12/10/2022   Acute on chronic diastolic heart failure (HCC) 12/10/2022   Chronic pain 12/10/2022   Prolonged QT interval 12/10/2022   Atrial flutter (  HCC) 12/06/2022   Atrial fibrillation (HCC) 09/16/2022   Paroxysmal atrial fibrillation with RVR (HCC) 09/15/2022   Leukopenia 09/15/2022   Elevated brain natriuretic peptide (BNP) level 09/15/2022   Macrocytic anemia 09/15/2022   BPH (benign prostatic hyperplasia) 09/15/2022   Encounter for therapeutic drug monitoring 08/29/2022   Nephrolithiasis 08/18/2022   Therapeutic opioid-induced constipation (OIC) 05/04/2022   Ankle swelling 12/15/2020   Essential  hypertension 12/15/2020   Hypokalemia 04/30/2017   Hypomagnesemia 04/30/2017   Hyponatremia 04/30/2017   PVC's (premature ventricular contractions) 04/30/2017   Shortness of breath 04/28/2017   Kidney stone 08/09/2014   Ureteral calculus 08/09/2014   Palpitations 05/06/2012   PCP:  Estanislado Pandy, MD Pharmacy:   [facility] [place] [place] - [address] [place] [address] Phone: [phone] Fax: [phone]  [facility] [address] [place] - [address] [place] [address] Phone: [phone] Fax: [phone]     Social Determinants of Health (SDOH) Social History: SDOH Screenings   Food Insecurity: No Food Insecurity (07/09/2023)  Housing: Low Risk  (07/09/2023)  Transportation Needs: No Transportation Needs (07/09/2023)  Utilities: Not At Risk (07/09/2023)  Alcohol Screen: Low Risk  (12/14/2022)  Depression (PHQ2-9): Low Risk  (12/13/2022)  Financial Resource Strain: Low Risk  (12/14/2022)  Physical Activity: Inactive (12/14/2022)  Social Connections: Socially Integrated (12/14/2022)  Stress: No Stress Concern Present (12/14/2022)  Tobacco Use: Low Risk  (07/10/2023)   SDOH Interventions:     Readmission Risk Interventions     No data to display

## 2023-07-10 NOTE — H&P (View-Only) (Signed)
ORTHOPAEDIC CONSULTATION  REQUESTING PHYSICIAN: Shon Hale, MD  ASSESSMENT AND PLAN: 88 y.o. female with the following: Right hip fracture  Plan open treatment internal fixation right hip with cannulated screws  The procedure has been fully reviewed with the patient; The risks and benefits of surgery have been discussed and explained and understood. Alternative treatment has also been reviewed, questions were encouraged and answered. The postoperative plan is also been reviewed. Internal fixation of the right hip with cannulated screws has a 95% chance of success but AVN and nonunion requiring subsequent surgery is always a possibility   This patient requires inpatient admission to manage this problem appropriately. Orthopedics recommends admission to a medical service and we will provide consultation and follow along  - Weight Bearing Status/Activity: Plan to be as tolerated  - Additional recommended labs/tests: None  -VTE Prophylaxis: Resume Eliquis on Thursday, October 10  - Pain control: Will require higher doses than usual secondary to chronic opioid therapy for back pain  - Follow-up plan: 4 weeks    Chief Complaint: Right hip pain  HPI: Tanya Mueller is a 88 y.o. female with chronic back pain turned lost her balance fell fractured her right hip.  Presented to the ER with severe pain and inability to ambulate but minimal deformity.  Patient has a history of chronic back pain from scoliosis and lumbar disc surgery takes chronic opioid therapy and usually according to her requires higher doses of anesthetic to get her anesthetized.  She is very active but occasionally will use a walker.  Her husband is at home she has a family member who is with her today  History of atrial fibrillation status postconversion  Past Medical History:  Diagnosis Date   Allergic rhinitis    Arthritis    Back ,hips   Cancer (HCC)    Complication of anesthesia    awareness during  general anesthesia.   Dysrhythmia    A-Fib and PSVT   Fatigue    History of atrial fibrillation    episode 2013 converted with cardizem-- happened at time acute illness due to kidney stone --  resolved  no issues since   History of kidney stones    History of sepsis    urosepsis  2013 due to kidney stone   Hypertension    Left nephrolithiasis    Left ureteral calculus    Low back pain    Neuromuscular disorder (HCC)    PAC (premature atrial contraction)    Scoliosis    Solitary lung nodule    benign right middle lobe per CT   Tremor    Past Surgical History:  Procedure Laterality Date   BACK SURGERY  2015   rod and screws   CATARACT EXTRACTION W/ INTRAOCULAR LENS  IMPLANT, BILATERAL  10/02/2008   CYSTOSCOPY WITH BIOPSY N/A 04/08/2019   Procedure: CYSTOSCOPY WITH bladder BIOPSY, FULGURATION;  Surgeon: Jerilee Field, MD;  Location: [facility];  Service: Urology;  Laterality: N/A;   CYSTOSCOPY WITH STENT PLACEMENT Left 09/15/2014   Procedure: CYSTOSCOPY WITH STENT PLACEMENT;  Surgeon: Doyne Keel, MD;  [facility];  Service: Urology;  Laterality: Left;   CYSTOSCOPY/RETROGRADE/URETEROSCOPY Left 08/09/2014   Procedure: CYSTOSCOPY/RETROGRADE/LEFT STENT;  Surgeon: Valetta Fuller, MD;  Location: [facility];  Service: Urology;  Laterality: Left;   CYSTOSCOPY/URETEROSCOPY/HOLMIUM LASER/STENT PLACEMENT Left 09/15/2022   Procedure: CYSTOSCOPY LEFT URETEROSCOPY/STONE BASKET EXTRACTION;  Surgeon: Bjorn Pippin, MD;  Location: [facility];  Service: Urology;  Laterality: Left;  1 HR FOR CASE   EXTRACORPOREAL SHOCK WAVE LITHOTRIPSY  10/03/2011   HOLMIUM LASER APPLICATION Left 09/15/2014   Procedure: HOLMIUM LASER APPLICATION;  Surgeon: Doyne Keel, MD;  Location[facility];  Service: Urology;  Laterality: Left;   IR URETERAL STENT LEFT NEW ACCESS W/O SEP NEPHROSTOMY CATH  08/18/2022   LAPAROTOMY W/  UNILATERAL SALPINGOOPHORECTOMY  age 25    NEPHROLITHOTOMY Left 08/18/2022   Procedure: LEFT NEPHROLITHOTOMY PERCUTANEOUS;  Surgeon: Bjorn Pippin, MD;  Location: [facility];  Service: Urology;  Laterality: Left;  2.5 HRS FOR CASE   TOTAL ABDOMINAL HYSTERECTOMY  age 56   w/ unilateral salpingoophorectomy   TRANSTHORACIC ECHOCARDIOGRAM  02/13/2012   mild LVH/  ef 65%/  trivial MR  &  PR/  mild RVE   URETEROSCOPY Left 09/15/2014   Procedure: URETEROSCOPY;  Surgeon: Doyne Keel, MD;  Location: [facility];  Service: Urology;  Laterality: Left;   Social History   Socioeconomic History   Marital status: Married    Spouse name: Not on file   Number of children: 1   Years of education: Not on file   Highest education level: Not on file  Occupational History    Employer: RETIRED  Tobacco Use   Smoking status: Never    Passive exposure: Never   Smokeless tobacco: Never  Vaping Use   Vaping status: Never Used  Substance and Sexual Activity   Alcohol use: No   Drug use: No   Sexual activity: Not Currently  Other Topics Concern   Not on file  Social History Narrative   Not on file   Social Determinants of Health   Financial Resource Strain: Low Risk  (12/14/2022)   Overall Financial Resource Strain (CARDIA)    Difficulty of Paying Living Expenses: Not hard at all  Food Insecurity: No Food Insecurity (07/09/2023)   Hunger Vital Sign    Worried About Running Out of Food in the Last Year: Never true    Ran Out of Food in the Last Year: Never true  Transportation Needs: No Transportation Needs (07/09/2023)   PRAPARE - Administrator, Civil Service (Medical): No    Lack of Transportation (Non-Medical): No  Physical Activity: Inactive (12/14/2022)   Exercise Vital Sign    Days of Exercise per Week: 0 days    Minutes of Exercise per Session: 0 min  Stress: No Stress Concern Present (12/14/2022)   [employer] of Occupational Health - Occupational Stress Questionnaire    Feeling of Stress : Not at  all  Social Connections: Socially Integrated (12/14/2022)   Social Connection and Isolation Panel [NHANES]    Frequency of Communication with Friends and Family: More than three times a week    Frequency of Social Gatherings with Friends and Family: More than three times a week    Attends Religious Services: 1 to 4 times per year    Active Member of [employer] or Organizations: Yes    Attends Banker Meetings: 1 to 4 times per year    Marital Status: Married   Family History  Problem Relation Age of Onset   Hypertension Mother    Cerebral aneurysm Father    Asthma Sister    Bladder Cancer Brother    Hypertension Brother    Bone cancer Brother    Cancer Other        Lung and bladder   Cancer Other        "Bone marrow  cancer"   Allergies  Allergen Reactions   Morphine     Nervous   Prior to Admission medications   Medication Sig Start Date End Date Taking? Authorizing Provider  acetaminophen (TYLENOL) 325 MG tablet Take 2 tablets (650 mg total) by mouth every 6 (six) hours as needed for mild pain (or Fever >/= 101). 09/16/22   Shon Hale, MD  apixaban (ELIQUIS) 5 MG TABS tablet Take 1 tablet (5 mg total) by mouth 2 (two) times daily. 03/22/23   Mallipeddi, Vishnu P, MD  diltiazem (CARDIZEM) 60 MG tablet Take 1 tablet (60 mg total) by mouth every 8 (eight) hours as needed (palpitations). 03/19/23   Mallipeddi, Vishnu P, MD  fluticasone (FLONASE) 50 MCG/ACT nasal spray Place 2 sprays into the nose daily as needed for allergies.    [provider]  furosemide (LASIX) 20 MG tablet Take 1 tablet (20 mg total) by mouth daily. May take an additional 20 mg daily as needed for leg swelling or shortness of breath 03/19/23   Mallipeddi, Vishnu P, MD  losartan (COZAAR) 50 MG tablet Take 1 tablet (50 mg total) by mouth daily. 08/29/22 08/24/23  Mallipeddi, Vishnu P, MD  Oxycodone HCl 10 MG TABS Take 10 mg by mouth 4 (four) times daily as needed. 12/01/22   [provider]  oxyCODONE-acetaminophen (PERCOCET) 10-325 MG tablet Take 1 tablet by mouth 4 (four) times daily as needed for pain. 08/28/22   [provider]  polyethylene glycol (MIRALAX / GLYCOLAX) 17 g packet Take 17 g by mouth daily as needed for moderate constipation.    [provider]  potassium chloride (KLOR-CON M) 10 MEQ tablet Take 1 tablet (10 mEq total) by mouth daily. 12/14/22   Mallipeddi, Vishnu P, MD  Prucalopride Succinate (MOTEGRITY) 2 MG TABS Take 1 tablet (2 mg total) by mouth daily. 04/04/23   Letta Median, PA-C  sertraline (ZOLOFT) 50 MG tablet Take 75 mg by mouth daily. 10/30/22   [provider]   CT Hip Right Wo Contrast-image interpreted.  CT scan was performed to evaluate the fracture for apex anterior angulation.  There was no apex anterior angulation this was a subcapital fracture and is amenable to cannulated screw fixation   Result Date: 07/09/2023 CLINICAL DATA:  Fracture, hip EXAM: CT OF THE RIGHT HIP WITHOUT CONTRAST TECHNIQUE: Multidetector CT imaging of the right hip was performed according to the standard protocol. Multiplanar CT image reconstructions were also generated. RADIATION DOSE REDUCTION: This exam was performed according to the departmental dose-optimization program which includes automated exposure control, adjustment of the mA and/or kV according to patient size and/or use of iterative reconstruction technique. COMPARISON:  Same-day radiograph of the right hip at 11:27 a.m. FINDINGS: Bones/Joint/Cartilage There is an acute fracture of the right subcapital femoral head/neck with mild foreshortening. The right femoral head is seated within the acetabulum. No additional acute fracture identified. Remote healed right inferior pubic ramus fracture. The right sacroiliac joint and pubic symphysis are anatomically aligned. Degenerative changes of the right hip. Chondrocalcinosis of the pubic symphysis and right hip. Partially visualized lumbosacral  fusion hardware. Ligaments Suboptimally assessed by CT. Soft tissues/Muscles. Soft tissue swelling overlying the right lateral hip is noted. No discrete fluid collection or hematoma identified. No enlarged lymph nodes identified in the field of view. The visualized intra-abdominal contents are unremarkable. IMPRESSION: 1. Acute right subcapital femoral head/neck fracture with mild foreshortening. The right femoral head is seated within the acetabulum. 2. Remote healed right inferior  pubic ramus fracture. Electronically Signed   By: Hart Robinsons M.D.   On: 07/09/2023 16:35   DG HIP UNILAT WITH PELVIS 2-3 VIEWS RIGHT-image interpreted subcapital fracture right hip  Result Date: 07/09/2023 CLINICAL DATA:  144615 Pain 144615 EXAM: DG HIP (WITH OR WITHOUT PELVIS) 2-3V RIGHT COMPARISON:  None Available. FINDINGS: There is subtle cortical irregularity along the medial border of the right femoral neck, along with relative foreshortening of the femoral neck relative to the contralateral side. Advanced osteopenia. Soft tissues unremarkable. IMPRESSION: Subtle cortical irregularity and foreshortening of the right femoral neck raises suspicion for impacted/displaced fracture. Further evaluation with cross-sectional imaging is advised. Electronically Signed   By: Olive Bass M.D.   On: 07/09/2023 13:23   DG Lumbar Spine Complete-image interpreted the patient has a fusion from L2-S1 this crosses the lumbosacral junction there are no complications related, this should not affect surgery  Result Date: 07/09/2023 CLINICAL DATA:  fall EXAM: LUMBAR SPINE - COMPLETE 4+ VIEW COMPARISON:  None Available. FINDINGS: Status post L2-S1 posterior lumbosacral fusion. Hardware alignment appears grossly within normal limits findings of hardware fracture. No compression deformity identified. Advanced osteopenia. Paravertebral soft tissues are IMPRESSION: Postsurgical changes L2-S1 posterior lumbosacral fusion. No acute fracture or  hardware complicating feature is identified, exam is somewhat limited advanced osteopenia. Consider CT for further evaluation if appropriate. Electronically Signed   By: Olive Bass M.D.   On: 07/09/2023 13:17   CT Head Wo Contrast report reviewed no interpretation  Result Date: 07/09/2023 CLINICAL DATA:  Neck trauma.  Fall.  On blood thinner. EXAM: CT HEAD WITHOUT CONTRAST CT CERVICAL SPINE WITHOUT CONTRAST TECHNIQUE: Multidetector CT imaging of the head and cervical spine was performed following the standard protocol without intravenous contrast. Multiplanar CT image reconstructions of the cervical spine were also generated. RADIATION DOSE REDUCTION: This exam was performed according to the departmental dose-optimization program which includes automated exposure control, adjustment of the mA and/or kV according to patient size and/or use of iterative reconstruction technique. COMPARISON:  None Available. FINDINGS: CT HEAD FINDINGS Brain: No evidence of acute infarction, hemorrhage, hydrocephalus, extra-axial collection or mass lesion/mass effect. Vascular: No hyperdense vessel or unexpected calcification. Skull: Normal. Negative for fracture or focal lesion. Sinuses/Orbits: No evidence of injury. Mild mucosal thickening in the paranasal sinuses. CT CERVICAL SPINE FINDINGS Alignment: Normal. Skull base and vertebrae: No acute fracture. No primary bone lesion or focal pathologic process. Soft tissues and spinal canal: No prevertebral fluid or swelling. No visible canal hematoma. Goiter Disc levels:  Much less than typical degenerative change for age. Upper chest: No evidence of injury IMPRESSION: No evidence of intracranial or cervical spine injury. Electronically Signed   By: Tiburcio Pea M.D.   On: 07/09/2023 12:06   CT Cervical Spine Wo Contrast report reviewed as well no individual interpretation done  Result Date: 07/09/2023 CLINICAL DATA:  Neck trauma.  Fall.  On blood thinner. EXAM: CT HEAD  WITHOUT CONTRAST CT CERVICAL SPINE WITHOUT CONTRAST TECHNIQUE: Multidetector CT imaging of the head and cervical spine was performed following the standard protocol without intravenous contrast. Multiplanar CT image reconstructions of the cervical spine were also generated. RADIATION DOSE REDUCTION: This exam was performed according to the departmental dose-optimization program which includes automated exposure control, adjustment of the mA and/or kV according to patient size and/or use of iterative reconstruction technique. COMPARISON:  None Available. FINDINGS: CT HEAD FINDINGS Brain: No evidence of acute infarction, hemorrhage, hydrocephalus, extra-axial collection or mass lesion/mass effect. Vascular: No hyperdense vessel  or unexpected calcification. Skull: Normal. Negative for fracture or focal lesion. Sinuses/Orbits: No evidence of injury. Mild mucosal thickening in the paranasal sinuses. CT CERVICAL SPINE FINDINGS Alignment: Normal. Skull base and vertebrae: No acute fracture. No primary bone lesion or focal pathologic process. Soft tissues and spinal canal: No prevertebral fluid or swelling. No visible canal hematoma. Goiter Disc levels:  Much less than typical degenerative change for age. Upper chest: No evidence of injury IMPRESSION: No evidence of intracranial or cervical spine injury. Electronically Signed   By: Tiburcio Pea M.D.   On: 07/09/2023 12:06   Family History Reviewed and non-contributory, no pertinent history of problems with bleeding or anesthesia    Review of Systems No fevers or chills no No numbness or tingling No chest pain No shortness of breath No bowel or bladder dysfunction No GI distress No headaches Joint pain back Right sided pain from her shoulder to her toes after the fall Complains of cramping both legs worse after fracture, does get those symptoms at home as well   OBJECTIVE  Vitals:Patient Vitals for the past 8 hrs:  BP Temp Temp src Pulse Resp SpO2   07/10/23 0530 (!) 136/53 98.7 F (37.1 C) Oral 79 18 93 %  07/10/23 0044 (!) 124/51 99.3 F (37.4 C) Axillary 85 18 94 %   General: Alert, no acute distress Cardiovascular: Warm extremities noted Respiratory: No cyanosis, no use of accessory musculature GI: No organomegaly, abdomen is soft and non-tender Skin: No lesions in the area of chief complaint other than those listed below in MSK exam.  Neurologic: Sensation intact distally save for the below mentioned MSK exam Psychiatric: Patient is competent for consent with normal mood and affect Lymphatic: No swelling obvious and reported other than the area involved in the exam below Extremities  RUE: Skin normal, no tenderness, no contracture, no subluxation, normal muscle tone ZOX:WRUE normal, no tenderness, no contracture, no subluxation, normal muscle tone RLE:No real shortening or limb alignment issue.  Skin is normal.  Tender over the greater trochanter.  No contracture subluxation and normal muscle tone AVW:UJWJ normal, no tenderness, no contracture, no subluxation, normal muscle tone    Test Results Imaging As above  Labs cbc Recent Labs    07/09/23 1248  WBC 11.7*  HGB 13.2  HCT 38.9  PLT 317    Labs inflam No results for input(s): "CRP" in the last 72 hours.  Invalid input(s): "ESR"  Labs coag No results for input(s): "INR", "PTT" in the last 72 hours.  Invalid input(s): "PT"  Recent Labs    07/09/23 1248  NA 141  K 3.6  CL 103  CO2 26  GLUCOSE 122*  BUN 10  CREATININE 0.66  CALCIUM 9.1

## 2023-07-10 NOTE — Anesthesia Procedure Notes (Signed)
Procedure Name: LMA Insertion Date/Time: 07/10/2023 12:32 PM  Performed by: Moshe Salisbury, CRNAPre-anesthesia Checklist: Patient identified, Patient being monitored, Emergency Drugs available, Timeout performed and Suction available Patient Re-evaluated:Patient Re-evaluated prior to induction Oxygen Delivery Method: Circle System Utilized Preoxygenation: Pre-oxygenation with 100% oxygen Induction Type: IV induction Ventilation: Mask ventilation without difficulty LMA: LMA inserted LMA Size: 3.0 Number of attempts: 1 Placement Confirmation: positive ETCO2 and breath sounds checked- equal and bilateral

## 2023-07-10 NOTE — Consult Note (Signed)
ORTHOPAEDIC CONSULTATION  REQUESTING PHYSICIAN: Shon Hale, MD  ASSESSMENT AND PLAN: 88 y.o. female with the following: Right hip fracture  Plan open treatment internal fixation right hip with cannulated screws  The procedure has been fully reviewed with the patient; The risks and benefits of surgery have been discussed and explained and understood. Alternative treatment has also been reviewed, questions were encouraged and answered. The postoperative plan is also been reviewed. Internal fixation of the right hip with cannulated screws has a 95% chance of success but AVN and nonunion requiring subsequent surgery is always a possibility   This patient requires inpatient admission to manage this problem appropriately. Orthopedics recommends admission to a medical service and we will provide consultation and follow along  - Weight Bearing Status/Activity: Plan to be as tolerated  - Additional recommended labs/tests: None  -VTE Prophylaxis: Resume Eliquis on Thursday, October 10  - Pain control: Will require higher doses than usual secondary to chronic opioid therapy for back pain  - Follow-up plan: 4 weeks    Chief Complaint: Right hip pain  HPI: Tanya Mueller is a 88 y.o. female with chronic back pain turned lost her balance fell fractured her right hip.  Presented to the ER with severe pain and inability to ambulate but minimal deformity.  Patient has a history of chronic back pain from scoliosis and lumbar disc surgery takes chronic opioid therapy and usually according to her requires higher doses of anesthetic to get her anesthetized.  She is very active but occasionally will use a walker.  Her husband is at home she has a family member who is with her today  History of atrial fibrillation status postconversion  Past Medical History:  Diagnosis Date   Allergic rhinitis    Arthritis    Back ,hips   Cancer (HCC)    Complication of anesthesia    awareness during  general anesthesia.   Dysrhythmia    A-Fib and PSVT   Fatigue    History of atrial fibrillation    episode 2013 converted with cardizem-- happened at time acute illness due to kidney stone --  resolved  no issues since   History of kidney stones    History of sepsis    urosepsis  2013 due to kidney stone   Hypertension    Left nephrolithiasis    Left ureteral calculus    Low back pain    Neuromuscular disorder (HCC)    PAC (premature atrial contraction)    Scoliosis    Solitary lung nodule    benign right middle lobe per CT   Tremor    Past Surgical History:  Procedure Laterality Date   BACK SURGERY  2015   rod and screws   CATARACT EXTRACTION W/ INTRAOCULAR LENS  IMPLANT, BILATERAL  10/02/2008   CYSTOSCOPY WITH BIOPSY N/A 04/08/2019   Procedure: CYSTOSCOPY WITH bladder BIOPSY, FULGURATION;  Surgeon: Jerilee Field, MD;  Location: [facility];  Service: Urology;  Laterality: N/A;   CYSTOSCOPY WITH STENT PLACEMENT Left 09/15/2014   Procedure: CYSTOSCOPY WITH STENT PLACEMENT;  Surgeon: Doyne Keel, MD;  Location[facility];  Service: Urology;  Laterality: Left;   CYSTOSCOPY/RETROGRADE/URETEROSCOPY Left 08/09/2014   Procedure: CYSTOSCOPY/RETROGRADE/LEFT STENT;  Surgeon: Valetta Fuller, MD;  Location: [facility];  Service: Urology;  Laterality: Left;   CYSTOSCOPY/URETEROSCOPY/HOLMIUM LASER/STENT PLACEMENT Left 09/15/2022   Procedure: CYSTOSCOPY LEFT URETEROSCOPY/STONE BASKET EXTRACTION;  Surgeon: Bjorn Pippin, MD;  Location: [facility];  Service: Urology;  Laterality: Left;  1 HR FOR CASE   EXTRACORPOREAL SHOCK WAVE LITHOTRIPSY  10/03/2011   HOLMIUM LASER APPLICATION Left 09/15/2014   Procedure: HOLMIUM LASER APPLICATION;  Surgeon: Doyne Keel, MD;  Location: [facility];  Service: Urology;  Laterality: Left;   IR URETERAL STENT LEFT NEW ACCESS W/O SEP NEPHROSTOMY CATH  08/18/2022   LAPAROTOMY W/  UNILATERAL SALPINGOOPHORECTOMY  age 25    NEPHROLITHOTOMY Left 08/18/2022   Procedure: LEFT NEPHROLITHOTOMY PERCUTANEOUS;  Surgeon: Bjorn Pippin, MD;  Location: [facility];  Service: Urology;  Laterality: Left;  2.5 HRS FOR CASE   TOTAL ABDOMINAL HYSTERECTOMY  age 56   w/ unilateral salpingoophorectomy   TRANSTHORACIC ECHOCARDIOGRAM  02/13/2012   mild LVH/  ef 65%/  trivial MR  &  PR/  mild RVE   URETEROSCOPY Left 09/15/2014   Procedure: URETEROSCOPY;  Surgeon: Doyne Keel, MD;  Location: [facility];  Service: Urology;  Laterality: Left;   Social History   Socioeconomic History   Marital status: Married    Spouse name: Not on file   Number of children: 1   Years of education: Not on file   Highest education level: Not on file  Occupational History    Employer: RETIRED  Tobacco Use   Smoking status: Never    Passive exposure: Never   Smokeless tobacco: Never  Vaping Use   Vaping status: Never Used  Substance and Sexual Activity   Alcohol use: No   Drug use: No   Sexual activity: Not Currently  Other Topics Concern   Not on file  Social History Narrative   Not on file   Social Determinants of Health   Financial Resource Strain: Low Risk  (12/14/2022)   Overall Financial Resource Strain (CARDIA)    Difficulty of Paying Living Expenses: Not hard at all  Food Insecurity: No Food Insecurity (07/09/2023)   Hunger Vital Sign    Worried About Running Out of Food in the Last Year: Never true    Ran Out of Food in the Last Year: Never true  Transportation Needs: No Transportation Needs (07/09/2023)   PRAPARE - Administrator, Civil Service (Medical): No    Lack of Transportation (Non-Medical): No  Physical Activity: Inactive (12/14/2022)   Exercise Vital Sign    Days of Exercise per Week: 0 days    Minutes of Exercise per Session: 0 min  Stress: No Stress Concern Present (12/14/2022)   [employer] of Occupational Health - Occupational Stress Questionnaire    Feeling of Stress : Not at  all  Social Connections: Socially Integrated (12/14/2022)   Social Connection and Isolation Panel [NHANES]    Frequency of Communication with Friends and Family: More than three times a week    Frequency of Social Gatherings with Friends and Family: More than three times a week    Attends Religious Services: 1 to 4 times per year    Active Member of [employer] or Organizations: Yes    Attends Banker Meetings: 1 to 4 times per year    Marital Status: Married   Family History  Problem Relation Age of Onset   Hypertension Mother    Cerebral aneurysm Father    Asthma Sister    Bladder Cancer Brother    Hypertension Brother    Bone cancer Brother    Cancer Other        Lung and bladder   Cancer Other        "Bone marrow  cancer"   Allergies  Allergen Reactions   Morphine     Nervous   Prior to Admission medications   Medication Sig Start Date End Date Taking? Authorizing Provider  acetaminophen (TYLENOL) 325 MG tablet Take 2 tablets (650 mg total) by mouth every 6 (six) hours as needed for mild pain (or Fever >/= 101). 09/16/22   Shon Hale, MD  apixaban (ELIQUIS) 5 MG TABS tablet Take 1 tablet (5 mg total) by mouth 2 (two) times daily. 03/22/23   Mallipeddi, Vishnu P, MD  diltiazem (CARDIZEM) 60 MG tablet Take 1 tablet (60 mg total) by mouth every 8 (eight) hours as needed (palpitations). 03/19/23   Mallipeddi, Vishnu P, MD  fluticasone (FLONASE) 50 MCG/ACT nasal spray Place 2 sprays into the nose daily as needed for allergies.    [provider]  furosemide (LASIX) 20 MG tablet Take 1 tablet (20 mg total) by mouth daily. May take an additional 20 mg daily as needed for leg swelling or shortness of breath 03/19/23   Mallipeddi, Vishnu P, MD  losartan (COZAAR) 50 MG tablet Take 1 tablet (50 mg total) by mouth daily. 08/29/22 08/24/23  Mallipeddi, Vishnu P, MD  Oxycodone HCl 10 MG TABS Take 10 mg by mouth 4 (four) times daily as needed. 12/01/22   [provider]  oxyCODONE-acetaminophen (PERCOCET) 10-325 MG tablet Take 1 tablet by mouth 4 (four) times daily as needed for pain. 08/28/22   [provider]  polyethylene glycol (MIRALAX / GLYCOLAX) 17 g packet Take 17 g by mouth daily as needed for moderate constipation.    [provider]  potassium chloride (KLOR-CON M) 10 MEQ tablet Take 1 tablet (10 mEq total) by mouth daily. 12/14/22   Mallipeddi, Vishnu P, MD  Prucalopride Succinate (MOTEGRITY) 2 MG TABS Take 1 tablet (2 mg total) by mouth daily. 04/04/23   Letta Median, PA-C  sertraline (ZOLOFT) 50 MG tablet Take 75 mg by mouth daily. 10/30/22   [provider]   CT Hip Right Wo Contrast-image interpreted.  CT scan was performed to evaluate the fracture for apex anterior angulation.  There was no apex anterior angulation this was a subcapital fracture and is amenable to cannulated screw fixation   Result Date: 07/09/2023 CLINICAL DATA:  Fracture, hip EXAM: CT OF THE RIGHT HIP WITHOUT CONTRAST TECHNIQUE: Multidetector CT imaging of the right hip was performed according to the standard protocol. Multiplanar CT image reconstructions were also generated. RADIATION DOSE REDUCTION: This exam was performed according to the departmental dose-optimization program which includes automated exposure control, adjustment of the mA and/or kV according to patient size and/or use of iterative reconstruction technique. COMPARISON:  Same-day radiograph of the right hip at 11:27 a.m. FINDINGS: Bones/Joint/Cartilage There is an acute fracture of the right subcapital femoral head/neck with mild foreshortening. The right femoral head is seated within the acetabulum. No additional acute fracture identified. Remote healed right inferior pubic ramus fracture. The right sacroiliac joint and pubic symphysis are anatomically aligned. Degenerative changes of the right hip. Chondrocalcinosis of the pubic symphysis and right hip. Partially visualized lumbosacral  fusion hardware. Ligaments Suboptimally assessed by CT. Soft tissues/Muscles. Soft tissue swelling overlying the right lateral hip is noted. No discrete fluid collection or hematoma identified. No enlarged lymph nodes identified in the field of view. The visualized intra-abdominal contents are unremarkable. IMPRESSION: 1. Acute right subcapital femoral head/neck fracture with mild foreshortening. The right femoral head is seated within the acetabulum. 2. Remote healed right inferior  pubic ramus fracture. Electronically Signed   By: Hart Robinsons M.D.   On: 07/09/2023 16:35   DG HIP UNILAT WITH PELVIS 2-3 VIEWS RIGHT-image interpreted subcapital fracture right hip  Result Date: 07/09/2023 CLINICAL DATA:  144615 Pain 144615 EXAM: DG HIP (WITH OR WITHOUT PELVIS) 2-3V RIGHT COMPARISON:  None Available. FINDINGS: There is subtle cortical irregularity along the medial border of the right femoral neck, along with relative foreshortening of the femoral neck relative to the contralateral side. Advanced osteopenia. Soft tissues unremarkable. IMPRESSION: Subtle cortical irregularity and foreshortening of the right femoral neck raises suspicion for impacted/displaced fracture. Further evaluation with cross-sectional imaging is advised. Electronically Signed   By: Olive Bass M.D.   On: 07/09/2023 13:23   DG Lumbar Spine Complete-image interpreted the patient has a fusion from L2-S1 this crosses the lumbosacral junction there are no complications related, this should not affect surgery  Result Date: 07/09/2023 CLINICAL DATA:  fall EXAM: LUMBAR SPINE - COMPLETE 4+ VIEW COMPARISON:  None Available. FINDINGS: Status post L2-S1 posterior lumbosacral fusion. Hardware alignment appears grossly within normal limits findings of hardware fracture. No compression deformity identified. Advanced osteopenia. Paravertebral soft tissues are IMPRESSION: Postsurgical changes L2-S1 posterior lumbosacral fusion. No acute fracture or  hardware complicating feature is identified, exam is somewhat limited advanced osteopenia. Consider CT for further evaluation if appropriate. Electronically Signed   By: Olive Bass M.D.   On: 07/09/2023 13:17   CT Head Wo Contrast report reviewed no interpretation  Result Date: 07/09/2023 CLINICAL DATA:  Neck trauma.  Fall.  On blood thinner. EXAM: CT HEAD WITHOUT CONTRAST CT CERVICAL SPINE WITHOUT CONTRAST TECHNIQUE: Multidetector CT imaging of the head and cervical spine was performed following the standard protocol without intravenous contrast. Multiplanar CT image reconstructions of the cervical spine were also generated. RADIATION DOSE REDUCTION: This exam was performed according to the departmental dose-optimization program which includes automated exposure control, adjustment of the mA and/or kV according to patient size and/or use of iterative reconstruction technique. COMPARISON:  None Available. FINDINGS: CT HEAD FINDINGS Brain: No evidence of acute infarction, hemorrhage, hydrocephalus, extra-axial collection or mass lesion/mass effect. Vascular: No hyperdense vessel or unexpected calcification. Skull: Normal. Negative for fracture or focal lesion. Sinuses/Orbits: No evidence of injury. Mild mucosal thickening in the paranasal sinuses. CT CERVICAL SPINE FINDINGS Alignment: Normal. Skull base and vertebrae: No acute fracture. No primary bone lesion or focal pathologic process. Soft tissues and spinal canal: No prevertebral fluid or swelling. No visible canal hematoma. Goiter Disc levels:  Much less than typical degenerative change for age. Upper chest: No evidence of injury IMPRESSION: No evidence of intracranial or cervical spine injury. Electronically Signed   By: Tiburcio Pea M.D.   On: 07/09/2023 12:06   CT Cervical Spine Wo Contrast report reviewed as well no individual interpretation done  Result Date: 07/09/2023 CLINICAL DATA:  Neck trauma.  Fall.  On blood thinner. EXAM: CT HEAD  WITHOUT CONTRAST CT CERVICAL SPINE WITHOUT CONTRAST TECHNIQUE: Multidetector CT imaging of the head and cervical spine was performed following the standard protocol without intravenous contrast. Multiplanar CT image reconstructions of the cervical spine were also generated. RADIATION DOSE REDUCTION: This exam was performed according to the departmental dose-optimization program which includes automated exposure control, adjustment of the mA and/or kV according to patient size and/or use of iterative reconstruction technique. COMPARISON:  None Available. FINDINGS: CT HEAD FINDINGS Brain: No evidence of acute infarction, hemorrhage, hydrocephalus, extra-axial collection or mass lesion/mass effect. Vascular: No hyperdense vessel  or unexpected calcification. Skull: Normal. Negative for fracture or focal lesion. Sinuses/Orbits: No evidence of injury. Mild mucosal thickening in the paranasal sinuses. CT CERVICAL SPINE FINDINGS Alignment: Normal. Skull base and vertebrae: No acute fracture. No primary bone lesion or focal pathologic process. Soft tissues and spinal canal: No prevertebral fluid or swelling. No visible canal hematoma. Goiter Disc levels:  Much less than typical degenerative change for age. Upper chest: No evidence of injury IMPRESSION: No evidence of intracranial or cervical spine injury. Electronically Signed   By: Tiburcio Pea M.D.   On: 07/09/2023 12:06   Family History Reviewed and non-contributory, no pertinent history of problems with bleeding or anesthesia    Review of Systems No fevers or chills no No numbness or tingling No chest pain No shortness of breath No bowel or bladder dysfunction No GI distress No headaches Joint pain back Right sided pain from her shoulder to her toes after the fall Complains of cramping both legs worse after fracture, does get those symptoms at home as well   OBJECTIVE  Vitals:Patient Vitals for the past 8 hrs:  BP Temp Temp src Pulse Resp SpO2   07/10/23 0530 (!) 136/53 98.7 F (37.1 C) Oral 79 18 93 %  07/10/23 0044 (!) 124/51 99.3 F (37.4 C) Axillary 85 18 94 %   General: Alert, no acute distress Cardiovascular: Warm extremities noted Respiratory: No cyanosis, no use of accessory musculature GI: No organomegaly, abdomen is soft and non-tender Skin: No lesions in the area of chief complaint other than those listed below in MSK exam.  Neurologic: Sensation intact distally save for the below mentioned MSK exam Psychiatric: Patient is competent for consent with normal mood and affect Lymphatic: No swelling obvious and reported other than the area involved in the exam below Extremities  RUE: Skin normal, no tenderness, no contracture, no subluxation, normal muscle tone ZOX:WRUE normal, no tenderness, no contracture, no subluxation, normal muscle tone RLE:No real shortening or limb alignment issue.  Skin is normal.  Tender over the greater trochanter.  No contracture subluxation and normal muscle tone AVW:UJWJ normal, no tenderness, no contracture, no subluxation, normal muscle tone    Test Results Imaging As above  Labs cbc Recent Labs    07/09/23 1248  WBC 11.7*  HGB 13.2  HCT 38.9  PLT 317    Labs inflam No results for input(s): "CRP" in the last 72 hours.  Invalid input(s): "ESR"  Labs coag No results for input(s): "INR", "PTT" in the last 72 hours.  Invalid input(s): "PT"  Recent Labs    07/09/23 1248  NA 141  K 3.6  CL 103  CO2 26  GLUCOSE 122*  BUN 10  CREATININE 0.66  CALCIUM 9.1

## 2023-07-10 NOTE — Anesthesia Preprocedure Evaluation (Signed)
Anesthesia Evaluation  Patient identified by MRN, date of birth, ID band Patient awake    Reviewed: Allergy & Precautions, H&P , NPO status , Patient's Chart, lab work & pertinent test results, reviewed documented beta blocker date and time   History of Anesthesia Complications (+) history of anesthetic complications  Airway Mallampati: II  TM Distance: >3 FB Neck ROM: full    Dental no notable dental hx.    Pulmonary neg pulmonary ROS, shortness of breath   Pulmonary exam normal breath sounds clear to auscultation       Cardiovascular Exercise Tolerance: Good hypertension, negative cardio ROS + dysrhythmias Atrial Fibrillation  Rhythm:regular Rate:Normal     Neuro/Psych  Neuromuscular disease negative neurological ROS  negative psych ROS   GI/Hepatic negative GI ROS, Neg liver ROS,,,  Endo/Other  negative endocrine ROS    Renal/GU Renal diseasenegative Renal ROS  negative genitourinary   Musculoskeletal   Abdominal   Peds  Hematology negative hematology ROS (+) Blood dyscrasia, anemia   Anesthesia Other Findings   Reproductive/Obstetrics negative OB ROS                             Anesthesia Physical Anesthesia Plan  ASA: 3 and emergent  Anesthesia Plan: General and General LMA   Post-op Pain Management:    Induction:   PONV Risk Score and Plan: Ondansetron  Airway Management Planned:   Additional Equipment:   Intra-op Plan:   Post-operative Plan:   Informed Consent: I have reviewed the patients History and Physical, chart, labs and discussed the procedure including the risks, benefits and alternatives for the proposed anesthesia with the patient or authorized representative who has indicated his/her understanding and acceptance.     Dental Advisory Given  Plan Discussed with: CRNA  Anesthesia Plan Comments:        Anesthesia Quick Evaluation

## 2023-07-10 NOTE — Transfer of Care (Signed)
Immediate Anesthesia Transfer of Care Note  Patient: Tanya Mueller  Procedure(s) Performed: PERCUTANEOUS FIXATION OF FEMORAL NECK (Right: Hip)  Patient Location: PACU  Anesthesia Type:General  Level of Consciousness: awake, alert , oriented, and patient cooperative  Airway & Oxygen Therapy: Patient Spontanous Breathing and Patient connected to face mask oxygen  Post-op Assessment: Report given to RN, Post -op Vital signs reviewed and stable, and Patient moving all extremities X 4  Post vital signs: Reviewed and stable  Last Vitals:  Vitals Value Taken Time  BP 182/77 07/10/23 1415  Temp    Pulse 84 07/10/23 1417  Resp 13 07/10/23 1417  SpO2 93 % 07/10/23 1417  Vitals shown include unfiled device data.  Last Pain:  Vitals:   07/10/23 1043  TempSrc:   PainSc: 5       Patients Stated Pain Goal: 4 (07/10/23 1043)  Complications: No notable events documented.

## 2023-07-11 ENCOUNTER — Inpatient Hospital Stay (HOSPITAL_COMMUNITY): Payer: PPO

## 2023-07-11 DIAGNOSIS — I480 Paroxysmal atrial fibrillation: Secondary | ICD-10-CM

## 2023-07-11 DIAGNOSIS — I10 Essential (primary) hypertension: Secondary | ICD-10-CM | POA: Diagnosis not present

## 2023-07-11 DIAGNOSIS — K5903 Drug induced constipation: Secondary | ICD-10-CM | POA: Diagnosis not present

## 2023-07-11 DIAGNOSIS — T402X5A Adverse effect of other opioids, initial encounter: Secondary | ICD-10-CM

## 2023-07-11 DIAGNOSIS — M25551 Pain in right hip: Secondary | ICD-10-CM | POA: Diagnosis not present

## 2023-07-11 LAB — CBC
HCT: 28.7 % — ABNORMAL LOW (ref 36.0–46.0)
Hemoglobin: 9.5 g/dL — ABNORMAL LOW (ref 12.0–15.0)
MCH: 34.9 pg — ABNORMAL HIGH (ref 26.0–34.0)
MCHC: 33.1 g/dL (ref 30.0–36.0)
MCV: 105.5 fL — ABNORMAL HIGH (ref 80.0–100.0)
Platelets: 224 10*3/uL (ref 150–400)
RBC: 2.72 MIL/uL — ABNORMAL LOW (ref 3.87–5.11)
RDW: 12.7 % (ref 11.5–15.5)
WBC: 9.5 10*3/uL (ref 4.0–10.5)
nRBC: 0 % (ref 0.0–0.2)

## 2023-07-11 LAB — BASIC METABOLIC PANEL
Anion gap: 9 (ref 5–15)
BUN: 10 mg/dL (ref 8–23)
CO2: 25 mmol/L (ref 22–32)
Calcium: 8 mg/dL — ABNORMAL LOW (ref 8.9–10.3)
Chloride: 102 mmol/L (ref 98–111)
Creatinine, Ser: 0.71 mg/dL (ref 0.44–1.00)
GFR, Estimated: >60 mL/min (ref >60)
Glucose, Bld: 102 mg/dL — ABNORMAL HIGH (ref 70–99)
Potassium: 3 mmol/L — ABNORMAL LOW (ref 3.5–5.1)
Sodium: 136 mmol/L (ref 135–145)

## 2023-07-11 MED ORDER — OXYCODONE HCL 5 MG PO TABS
15.0000 mg | ORAL_TABLET | ORAL | Status: DC | PRN
  Administered 2023-07-11 – 2023-07-12 (×5): 15 mg via ORAL
  Filled 2023-07-11 (×5): qty 3

## 2023-07-11 MED ORDER — ACETAMINOPHEN 500 MG PO TABS
1000.0000 mg | ORAL_TABLET | Freq: Three times a day (TID) | ORAL | Status: DC
  Administered 2023-07-11 – 2023-07-12 (×3): 1000 mg via ORAL
  Filled 2023-07-11 (×3): qty 2

## 2023-07-11 NOTE — [place] FL2 (Signed)
[place] MEDICAID FL2 LEVEL OF CARE FORM     IDENTIFICATION  Patient Name: Tanya Mueller Birthdate: September 18, 1935 Sex: female Admission Date (Current Location): 07/09/2023  [facility] and [place] Number:  [employer] and Address:  [facility],  [address], [place] [address]      Provider Number: [id number]  Attending Physician Name and Address:  Vassie Loll, MD  Relative Name and Phone Number:       Current Level of Care: Hospital Recommended Level of Care: Skilled Nursing Facility Prior Approval Number:    Date Approved/Denied:   PASRR Number: [id number]  Discharge Plan: SNF    Current Diagnoses: Patient Active Problem List   Diagnosis Date Noted   Hip pain, acute, right 07/09/2023   Acquired dilation of right ventricle of heart 03/19/2023   (HFpEF) heart failure with preserved ejection fraction (HCC) 03/19/2023   Acute respiratory failure with hypoxia (HCC) 12/10/2022   Acute on chronic diastolic heart failure (HCC) 12/10/2022   Chronic pain 12/10/2022   Prolonged QT interval 12/10/2022   Atrial flutter (HCC) 12/06/2022   Atrial fibrillation (HCC) 09/16/2022   Paroxysmal atrial fibrillation with RVR (HCC) 09/15/2022   Leukopenia 09/15/2022   Elevated brain natriuretic peptide (BNP) level 09/15/2022   Macrocytic anemia 09/15/2022   BPH (benign prostatic hyperplasia) 09/15/2022   Encounter for therapeutic drug monitoring 08/29/2022   Nephrolithiasis 08/18/2022   Therapeutic opioid-induced constipation (OIC) 05/04/2022   Ankle swelling 12/15/2020   Essential hypertension 12/15/2020   Hypokalemia 04/30/2017   Hypomagnesemia 04/30/2017   Hyponatremia 04/30/2017   PVC's (premature ventricular contractions) 04/30/2017   Shortness of breath 04/28/2017   Kidney stone 08/09/2014   Ureteral calculus 08/09/2014   Palpitations 05/06/2012    Orientation RESPIRATION BLADDER Height & Weight     Self, Time, Situation, Place  O2 (see dc  summary) Continent Weight: 131 lb 9.8 oz (59.7 kg) Height:  5\' 5"  (165.1 cm)  BEHAVIORAL SYMPTOMS/MOOD NEUROLOGICAL BOWEL NUTRITION STATUS      Continent Diet (see dc summary)  AMBULATORY STATUS COMMUNICATION OF NEEDS Skin   Extensive Assist Verbally Surgical wounds                       Personal Care Assistance Level of Assistance  Bathing, Feeding, Dressing Bathing Assistance: Limited assistance Feeding assistance: Independent Dressing Assistance: Limited assistance     Functional Limitations Info  Sight, Hearing, Speech Sight Info: Adequate Hearing Info: Adequate Speech Info: Adequate    SPECIAL CARE FACTORS FREQUENCY  PT (By licensed PT), OT (By licensed OT)     PT Frequency: 5x week OT Frequency: 5x week            Contractures Contractures Info: Not present    Additional Factors Info  Code Status, Allergies Code Status Info: Full Allergies Info: Morphine           Current Medications (07/11/2023):  This is the current hospital active medication list Current Facility-Administered Medications  Medication Dose Route Frequency Provider Last Rate Last Admin   acetaminophen (TYLENOL) tablet 1,000 mg  1,000 mg Oral Q6H Vickki Hearing, MD   1,000 mg at 07/11/23 0618   acetaminophen (TYLENOL) tablet 325-650 mg  325-650 mg Oral Q6H PRN Vickki Hearing, MD       [START ON 07/12/2023] apixaban (ELIQUIS) tablet 5 mg  5 mg Oral BID Emokpae, Courage, MD       bisacodyl (DULCOLAX) suppository 10 mg  10 mg Rectal  Daily PRN Vickki Hearing, MD       diltiazem (CARDIZEM) tablet 60 mg  60 mg Oral Q8H PRN Vickki Hearing, MD       docusate sodium (COLACE) capsule 100 mg  100 mg Oral BID Vickki Hearing, MD   100 mg at 07/11/23 0827   HYDROmorphone (DILAUDID) injection 0.5-1 mg  0.5-1 mg Intravenous Q4H PRN Vickki Hearing, MD   0.5 mg at 07/11/23 0406   HYDROmorphone (DILAUDID) tablet 2-3 mg  2-3 mg Oral Q4H PRN Vickki Hearing, MD   2 mg at  07/11/23 0247   labetalol (NORMODYNE) injection 10 mg  10 mg Intravenous Q4H PRN Vickki Hearing, MD       losartan (COZAAR) tablet 50 mg  50 mg Oral Daily Vickki Hearing, MD   50 mg at 07/11/23 0827   menthol-cetylpyridinium (CEPACOL) lozenge 3 mg  1 lozenge Oral PRN Vickki Hearing, MD       Or   phenol (CHLORASEPTIC) mouth spray 1 spray  1 spray Mouth/Throat PRN Vickki Hearing, MD       methocarbamol (ROBAXIN) tablet 500 mg  500 mg Oral Q6H PRN Vickki Hearing, MD   500 mg at 07/10/23 2039   Or   methocarbamol (ROBAXIN) 500 mg in dextrose 5 % 50 mL IVPB  500 mg Intravenous Q6H PRN Vickki Hearing, MD       methocarbamol (ROBAXIN) tablet 750 mg  750 mg Oral TID Vickki Hearing, MD   750 mg at 07/11/23 0825   metoCLOPramide (REGLAN) tablet 5-10 mg  5-10 mg Oral Q8H PRN Vickki Hearing, MD       Or   metoCLOPramide (REGLAN) injection 5-10 mg  5-10 mg Intravenous Q8H PRN Vickki Hearing, MD       mupirocin ointment (BACTROBAN) 2 % 1 Application  1 Application Nasal BID Vickki Hearing, MD   1 Application at 07/10/23 [phone]   oxyCODONE (Oxy IR/ROXICODONE) immediate release tablet 15 mg  15 mg Oral Q4H PRN Vickki Hearing, MD       polyethylene glycol (MIRALAX / GLYCOLAX) packet 17 g  17 g Oral BID Vickki Hearing, MD   17 g at 07/11/23 2130   sertraline (ZOLOFT) tablet 75 mg  75 mg Oral Daily Vickki Hearing, MD   75 mg at 07/11/23 0826   sodium chloride flush (NS) 0.9 % injection 3 mL  3 mL Intravenous Q12H Vickki Hearing, MD   3 mL at 07/11/23 0830   sodium chloride flush (NS) 0.9 % injection 3 mL  3 mL Intravenous Q12H Vickki Hearing, MD   3 mL at 07/11/23 0829   sodium chloride flush (NS) 0.9 % injection 3 mL  3 mL Intravenous PRN Vickki Hearing, MD       traZODone (DESYREL) tablet 50 mg  50 mg Oral QHS PRN Vickki Hearing, MD         Discharge Medications: Please see discharge summary for a list of discharge  medications.  Relevant Imaging Results:  Relevant Lab Results:   Additional Information SSN: [id number] [place]

## 2023-07-11 NOTE — Progress Notes (Signed)
PROGRESS NOTE   Tanya Mueller, is a 88 y.o. female, DOB - [birth year], ZOX:[id number]  Admit date - 07/09/2023   Admitting Physician Courage Mariea Clonts, MD  Outpatient Primary MD for the patient is Sasser, Clarene Critchley, MD  LOS - 2  Chief Complaint  Patient presents with   Fall       Brief Narrative:   88 y.o. female with pmhx relevant for chronic back pain/prior back surgeries, chronic opiate use, with h/o opioid-induced constipation, nephrolithiasis and HTN, history of paroxysmal A-fib currently on Eliquis for stroke prevention who presents from home after mechanical fall with Rt hip pain and is found to have right hip fracture.  S/p ORIF on 07/10/23    -Assessment and Plan: 1)Rt Hip Fx--suspect mechanical fall Right hip CT shows---Acute right subcapital femoral head/neck fracture with mild foreshortening. The right femoral head is seated within the acetabulum. S/p PERCUTANEOUS FIXATION OF FEMORAL NECK on 07/10/23 -Continue weightbearing as tolerated with a walker for 6 weeks -Immediate range of motion and out of bed Follow-up in 4 weeks for x-rays and further postoperative follow-up. -Safe to resume Eliquis on Thursday 07/12/23 am dose. -Duration by physical therapy plan is for skilled nursing facility rehabilitation; TOC on more helping with placement. -Continue the use of adjusted dose oxycodone, scheduled Tylenol and Robaxin for pain management.  2)Leukocytosis--- previously 11.7 probably reactive in the setting of mechanical fall with hip fracture -No signs of acute infection appreciated.  3) chronic back pain/chronic use of opiates -Prior back surgeries---now also having pain in her hip after fracture. --Lumbar x-rays show Postsurgical changes L2-S1 posterior lumbosacral fusion. No acute fracture or hardware complicating feature is identified,  -Continue pain control with opiates.   4)H/o opioid-induced constipation--- due to #3 above- -continue laxatives -Maintain adequate  hydration.   5)PAFib-history of paroxysmal A-fib -CHADSVASC score 3-4 -May use Cardizem as needed elevated HR -Resume Eliquis on Thursday 07/12/23 am dose. -Rate controlled and is stable   6)HTN--- continue losartan -Continue heart healthy/low-sodium diet -Continue as needed labetalol.   Status is: Inpatient   Disposition: The patient is from: Home              Anticipated d/c is to: SNF              Anticipated d/c date is: 1 day              Patient currently is medically stable to d/c. Barriers: Awaiting PT eval, SNF bed and insurance authorization to go to SNF  Code Status :  -  Code Status: Full Code   Family Communication:     (patient is alert, awake and coherent)  Discussed with Daughter at bedside  DVT Prophylaxis  :   - SCDs  SCDs Start: 07/10/23 1637 Place TED hose Start: 07/10/23 1637 SCDs Start: 07/09/23 1442 Place TED hose Start: 07/09/23 1442 apixaban (ELIQUIS) tablet 5 mg  Lab Results  Component Value Date   PLT 224 07/11/2023   Inpatient Medications  Scheduled Meds:  acetaminophen  1,000 mg Oral TID   [START ON 07/12/2023] apixaban  5 mg Oral BID   docusate sodium  100 mg Oral BID   losartan  50 mg Oral Daily   methocarbamol  750 mg Oral TID   mupirocin ointment  1 Application Nasal BID   polyethylene glycol  17 g Oral BID   sertraline  75 mg Oral Daily   sodium chloride flush  3 mL Intravenous Q12H   sodium chloride flush  3 mL Intravenous Q12H   Continuous Infusions:  methocarbamol (ROBAXIN) IV     PRN Meds:.bisacodyl, diltiazem, labetalol, menthol-cetylpyridinium **OR** phenol, methocarbamol **OR** methocarbamol (ROBAXIN) IV, metoCLOPramide **OR** metoCLOPramide (REGLAN) injection, oxyCODONE, sodium chloride flush, traZODone   Anti-infectives (From admission, onward)    Start     Dose/Rate Route Frequency Ordered Stop   07/10/23 1800  ceFAZolin (ANCEF) IVPB 2g/100 mL premix        2 g 200 mL/hr over 30 Minutes Intravenous Every 6 hours  07/10/23 1636 07/11/23 0107   07/10/23 1115  ceFAZolin (ANCEF) IVPB 2g/100 mL premix        2 g 200 mL/hr over 30 Minutes Intravenous On call to O.R. 07/10/23 1026 07/10/23 1245   07/10/23 1048  ceFAZolin (ANCEF) 2-4 GM/100ML-% IVPB       Note to Pharmacy: Montel Clock E: cabinet override      07/10/23 1048 07/10/23 1327      Subjective: Tanya Mueller no fever, no chest pain, no nausea, no vomiting.  Good saturation on room air.  Complaining of rib cage pain, back and right hip.   Objective: Vitals:   07/10/23 1527 07/10/23 2002 07/10/23 2330 07/11/23 0413  BP: (!) 144/80 (!) 142/56 (!) 145/66 137/70  Pulse: 80 75 73 73  Resp: 10 18 18 18   Temp: 98.6 F (37 C) 98.6 F (37 C) 98.5 F (36.9 C) 98.4 F (36.9 C)  TempSrc: Oral Oral Oral Oral  SpO2: 98% 99% 98% 97%  Weight:      Height:        Intake/Output Summary (Last 24 hours) at 07/11/2023 1548 Last data filed at 07/11/2023 0900 Gross per 24 hour  Intake 1213.55 ml  Output 1100 ml  Net 113.55 ml   Filed Weights   07/09/23 1731 07/10/23 1043  Weight: 59.7 kg 59.7 kg    Physical Exam General exam: Alert, awake, oriented x 3; reporting pain in her rib cage, back and right hip. Respiratory system: Clear to auscultation. Respiratory effort normal.  Good saturation on room air. Cardiovascular system:RRR. No rubs or gallops; no JVD. Gastrointestinal system: Abdomen is nondistended, soft and nontender. No organomegaly or masses felt. Normal bowel sounds heard. Central nervous system: Alert and oriented. No focal neurological deficits. Extremities: No cyanosis or clubbing. Skin: No petechiae. Psychiatry: Judgement and insight appear normal. Mood & affect appropriate.   Data Reviewed: I have personally reviewed following labs and imaging studies  CBC: Recent Labs  Lab 07/09/23 1248 07/11/23 0434  WBC 11.7* 9.5  NEUTROABS 9.5*  --   HGB 13.2 9.5*  HCT 38.9 28.7*  MCV 103.2* 105.5*  PLT 317 224   Basic  Metabolic Panel: Recent Labs  Lab 07/09/23 1248 07/11/23 0434  NA 141 136  K 3.6 3.0*  CL 103 102  CO2 26 25  GLUCOSE 122* 102*  BUN 10 10  CREATININE 0.66 0.71  CALCIUM 9.1 8.0*   GFR: Estimated Creatinine Clearance: 45.4 mL/min (by C-G formula based on SCr of 0.71 mg/dL).  Recent Results (from the past 240 hour(s))  Surgical PCR screen     Status: None   Collection Time: 07/09/23  6:34 PM   Specimen: Nasal Mucosa; Nasal Swab  Result Value Ref Range Status   MRSA, PCR NEGATIVE NEGATIVE Final   Staphylococcus aureus NEGATIVE NEGATIVE Final    Comment: (NOTE) The Xpert SA Assay (FDA approved for NASAL specimens in patients 36 years of age and older), is one component of a comprehensive surveillance  program. It is not intended to diagnose infection nor to guide or monitor treatment. Performed at [facility], [address], [place], [place] [address]     Radiology Studies: [facility] Chest [facility] 1 View  Result Date: 07/11/2023 CLINICAL DATA:  88 year old female with bilateral rib and chest pain. EXAM: PORTABLE CHEST 1 VIEW COMPARISON:  Portable chest 12/10/2022 and earlier. FINDINGS: Portable AP semi upright view at 0651 hours. Partially visible chronic upper lumbar spine fusion hardware. Large lung volumes. Regressed and largely resolved bilateral pleural effusions since March. Stable mild cardiomegaly. Other mediastinal contours are within normal limits. Visualized tracheal air column is within normal limits. No pneumothorax or pulmonary edema. No consolidation. Possible small residual pleural effusions. Negative visible bowel gas. Osteopenia. No acute osseous abnormality identified. IMPRESSION: 1. Substantial regression of bilateral pleural effusions since March, possible trace residual. 2. No new cardiopulmonary abnormality. No acute osseous abnormality identified. Electronically Signed   By: Odessa Fleming M.D.   On: 07/11/2023 07:09   DG HIP UNILAT WITH PELVIS 2-3 VIEWS RIGHT  Result Date:  07/10/2023 CLINICAL DATA:  Fracture EXAM: DG HIP (WITH OR WITHOUT PELVIS) 2-3V RIGHT COMPARISON:  the previous day's study FINDINGS: A series of fluoroscopic intraoperative images. The subcapital fracture seen on prior imaging less conspicuous. Images document placement of 3 pins across the femoral neck. Near anatomic alignment. IMPRESSION: Instrumented fixation right femoral neck fracture. Electronically Signed   By: Corlis Leak M.D.   On: 07/10/2023 17:41   DG C-Arm 1-60 Min  Result Date: 07/10/2023 CLINICAL DATA:  Hip pain EXAM: DG C-ARM 1-60 MIN COMPARISON:  CT from previous day FINDINGS: Minimally displaced subcapital femoral fracture is less conspicuous on these fluoroscopic spot images, which document placement of 2 orthopedic pins across the femoral neck. IMPRESSION: Orthopedic fixation of subcapital femoral fracture. Electronically Signed   By: Corlis Leak M.D.   On: 07/10/2023 17:39   DG HIP UNILAT WITH PELVIS 2-3 VIEWS RIGHT  Result Date: 07/10/2023 CLINICAL DATA:  Status post hardware fixation of the right femoral neck fracture. EXAM: DG HIP (WITH OR WITHOUT PELVIS) 2-3V RIGHT COMPARISON:  Right hip CT dated 07/09/2023. FINDINGS: Inner or placement of 3 screws bridging the right femoral neck fracture with anatomic position and alignment of the major fragments in the frontal projection, difficult to visualize in the lateral projection due to overlying soft tissues. Diffuse osteopenia. Lumbar spine fixation hardware. IMPRESSION: Status post internal fixation of the right femoral neck fracture with anatomic position and alignment of the major fragments in the frontal projection. Electronically Signed   By: Beckie Salts M.D.   On: 07/10/2023 15:57    Scheduled Meds:  acetaminophen  1,000 mg Oral TID   [START ON 07/12/2023] apixaban  5 mg Oral BID   docusate sodium  100 mg Oral BID   losartan  50 mg Oral Daily   methocarbamol  750 mg Oral TID   mupirocin ointment  1 Application Nasal BID    polyethylene glycol  17 g Oral BID   sertraline  75 mg Oral Daily   sodium chloride flush  3 mL Intravenous Q12H   sodium chloride flush  3 mL Intravenous Q12H   Continuous Infusions:  methocarbamol (ROBAXIN) IV      LOS: 2 days   Vassie Loll M.D on 07/11/2023 at 3:48 PM  Go to [email or web address] - for contact info  [facility] - Office  [phone]  If 7PM-7AM, please contact night-coverage [email or web address] 07/11/2023, 3:48 PM

## 2023-07-11 NOTE — Evaluation (Signed)
Occupational Therapy Evaluation Patient Details Name: Tanya Mueller MRN: [id number] DOB: 1935-07-08 Today's Date: 07/11/2023   History of Present Illness Rt Hip Fx--suspect mechanical fall  Right hip CT shows---Acute right subcapital femoral head/neck fracture with mild  foreshortening. The right femoral head is seated within the  acetabulum. Status post PERCUTANEOUS FIXATION OF FEMORAL NECK (Right). (per MD)   Clinical Impression   Pt agreeable to OT and PT co-evaluation. Pt lives with husband at home who cannot help with mobility. Daughter lives close by.  Assisted for some lower body ADL tasks at baseline. Mod A for bed mobility today. Min to mod A for transfers. Able to ambulate some within the room with min A and labored effort. High fall risk at this time. Pt will benefit from continued OT in the hospital and recommended venue below to increase strength, balance, and endurance for safe ADL's.         If plan is discharge home, recommend the following: A little help with walking and/or transfers;A lot of help with bathing/dressing/bathroom;Assistance with cooking/housework;Assist for transportation;Help with stairs or ramp for entrance    Functional Status Assessment  Patient has had a recent decline in their functional status and demonstrates the ability to make significant improvements in function in a reasonable and predictable amount of time.  Equipment Recommendations  None recommended by OT           Precautions / Restrictions Precautions Precautions: Fall Restrictions Weight Bearing Restrictions: Yes RLE Weight Bearing: Weight bearing as tolerated      Mobility Bed Mobility Overal bed mobility: Needs Assistance Bed Mobility: Supine to Sit     Supine to sit: Mod assist     General bed mobility comments: Labored effort; assist to move legs and come to sitting.    Transfers Overall transfer level: Needs assistance Equipment used: Rolling walker (2  wheels) Transfers: Sit to/from Stand, Bed to chair/wheelchair/BSC Sit to Stand: Mod assist, Min assist     Step pivot transfers: Min assist     General transfer comment: Min to mod A to boost from EOB with RW; more min A once standing to step to [facility] and chair.      Balance Overall balance assessment: Needs assistance Sitting-balance support: Feet supported, Bilateral upper extremity supported Sitting balance-Leahy Scale: Fair Sitting balance - Comments: fair to good seated at EOB   Standing balance support: Bilateral upper extremity supported, During functional activity, Reliant on assistive device for balance Standing balance-Leahy Scale: Poor Standing balance comment: using RW                           ADL either performed or assessed with clinical judgement   ADL Overall ADL's : Needs assistance/impaired     Grooming: Set up;Sitting   Upper Body Bathing: Set up;Sitting   Lower Body Bathing: Maximal assistance;Sitting/lateral leans   Upper Body Dressing : Set up;Sitting   Lower Body Dressing: Maximal assistance;Sitting/lateral leans   Toilet Transfer: Minimal assistance;Moderate assistance;Stand-pivot;Ambulation;Rolling walker (2 wheels);BSC/3in1 Toilet Transfer Details (indicate cue type and reason): EOB to [facility]. Toileting- Clothing Manipulation and Hygiene: Contact guard assist;Sitting/lateral lean;Minimal assistance       Functional mobility during ADLs: Minimal assistance General ADL Comments: Able to ambulate ~8 to 10 feet in the room going forward and backward with RW.     Vision Baseline Vision/History: 1 Wears glasses Ability to See in Adequate Light: 1 Impaired Patient Visual Report: No change from baseline  Vision Assessment?: No apparent visual deficits     Perception Perception: Not tested       Praxis Praxis: Not tested       Pertinent Vitals/Pain Pain Assessment Pain Assessment: No/denies pain     Extremity/Trunk Assessment  Upper Extremity Assessment Upper Extremity Assessment: Generalized weakness (Able to functionally use RW for transfers.)   Lower Extremity Assessment Lower Extremity Assessment: Defer to PT evaluation   Cervical / Trunk Assessment Cervical / Trunk Assessment: Kyphotic   Communication Communication Communication: No apparent difficulties   Cognition Arousal: Alert Behavior During Therapy: WFL for tasks assessed/performed Overall Cognitive Status: Within Functional Limits for tasks assessed                                       General Comments  Report of dizziness when first sitting at EOB.               Home Living Family/patient expects to be discharged to:: Private residence Living Arrangements: Spouse/significant other Available Help at Discharge: Family;Available 24 hours/day (husband is 49 and is unable to provide physical assistance.) Type of Home: House Home Access: Stairs to enter [employer] of Steps: 1 Entrance Stairs-Rails: None Home Layout: One level     Bathroom Shower/Tub: Chief Strategy Officer: Standard Bathroom Accessibility: No   Home Equipment: Agricultural consultant (2 wheels);Rollator (4 wheels);Cane - quad;Cane - single point;BSC/3in1          Prior Functioning/Environment Prior Level of Function : Needs assist;History of Falls (last six months)       Physical Assist : ADLs (physical);Mobility (physical) Mobility (physical): Transfers;Gait ADLs (physical): IADLs;Dressing;Bathing Mobility Comments: PRN use of cane and RW for ambulation. Mostly staying in the house the last few months. ADLs Comments: Assist for lower body ADL tasks. IADL assist.        OT Problem List: Decreased strength;Decreased activity tolerance;Decreased range of motion;Impaired balance (sitting and/or standing)      OT Treatment/Interventions: Self-care/ADL training;Therapeutic exercise;Therapeutic activities;Patient/family  education;Balance training    OT Goals(Current goals can be found in the care plan section) Acute Rehab OT Goals Patient Stated Goal: return home OT Goal Formulation: With patient Time For Goal Achievement: 07/25/23 Potential to Achieve Goals: Fair  OT Frequency: Min 2X/week    Co-evaluation PT/OT/SLP Co-Evaluation/Treatment: Yes Reason for Co-Treatment: To address functional/ADL transfers   OT goals addressed during session: ADL's and self-care                       End of Session Equipment Utilized During Treatment: Rolling walker (2 wheels) Nurse Communication: Mobility status  Activity Tolerance: Patient tolerated treatment well Patient left: in chair;with call bell/phone within reach;with family/visitor present  OT Visit Diagnosis: Unsteadiness on feet (R26.81);Other abnormalities of gait and mobility (R26.89);History of falling (Z91.81);Muscle weakness (generalized) (M62.81)                Time: 3716-9678 OT Time Calculation (min): 24 min Charges:  OT General Charges $OT Visit: 1 Visit OT Evaluation $OT Eval Low Complexity: 1 Low  Romina Divirgilio OT, MOT  Danie Chandler 07/11/2023, 9:54 AM

## 2023-07-11 NOTE — Plan of Care (Signed)
  Problem: Acute Rehab PT Goals(only PT should resolve) Goal: Pt Will Go Supine/Side To Sit Outcome: Progressing Flowsheets (Taken 07/11/2023 1230) Pt will go Supine/Side to Sit: with minimal assist Goal: Patient Will Transfer Sit To/From Stand Outcome: Progressing Flowsheets (Taken 07/11/2023 1230) Patient will transfer sit to/from stand: with minimal assist Goal: Pt Will Transfer Bed To Chair/Chair To Bed Outcome: Progressing Flowsheets (Taken 07/11/2023 1230) Pt will Transfer Bed to Chair/Chair to Bed: with min assist Goal: Pt Will Ambulate Outcome: Progressing Flowsheets (Taken 07/11/2023 1230) Pt will Ambulate:  50 feet  with minimal assist  with rolling walker   12:31 PM, 07/11/23 Ocie Bob, MPT Physical Therapist with [facility] [phone] office [phone] mobile phone

## 2023-07-11 NOTE — Progress Notes (Addendum)
Patient ID: Tanya Mueller, female   DOB: 29-Dec-1934, 88 y.o.   MRN: [id number] Postop day 1 status post cannulated screw fixation right hip for right femoral neck fracture  BP 137/70 (BP Location: Left Arm)   Pulse 73   Temp 98.4 F (36.9 C) (Oral)   Resp 18   Ht 5\' 5"  (1.651 m)   Wt 59.7 kg   SpO2 97%   BMI 21.90 kg/m   Patient is awake and alert eating today but complains of severe pain in her rib cage abdomen and pain in her right hip  The patient is on chronic opioid therapy at home and qualifies for chronic opioid use disorder  Recommend physical therapy Remove Foley Weight-bear as tolerated DVT prophylaxis, resume Eliquis tomorrow 6:00 in the morning

## 2023-07-11 NOTE — Progress Notes (Signed)
Dr. Gwenlyn Perking notified of patients pain in rib cage area.

## 2023-07-11 NOTE — Evaluation (Signed)
Physical Therapy Evaluation Patient Details Name: Tanya Mueller MRN: [id number] DOB: [birth year] Today's Date: 07/11/2023  History of Present Illness  Tanya Mueller  is a 88 y.o. female s/p PERCUTANEOUS FIXATION OF FEMORAL NECK (Right) on 07/10/23, with pmhx relevant for chronic back pain/prior back surgeries, chronic opiate use, with h/o opioid-induced constipation, nephrolithiasis and HTN, history of paroxysmal A-fib currently on Eliquis for stroke prevention who presents from home after mechanical fall with Rt hip pain and is found to have left hip fracture   Clinical Impression  Patient demonstrates slow labored movement for sitting up at bedside with most difficulty moving RLE due to increasing pain, able to transfer to/from [facility] with Min/mod assist and limited to taking steps at bedside secondary to c/o fatigue and right hip pain.  Patient tolerated sitting up in chair after therapy with her daughter present.  Patient will benefit from continued skilled physical therapy in hospital and recommended venue below to increase strength, balance, endurance for safe ADLs and gait.           If plan is discharge home, recommend the following: A lot of help with bathing/dressing/bathroom;A lot of help with walking and/or transfers;Help with stairs or ramp for entrance;Assistance with cooking/housework   Can travel by private vehicle   Yes    Equipment Recommendations None recommended by PT  Recommendations for Other Services       Functional Status Assessment Patient has had a recent decline in their functional status and demonstrates the ability to make significant improvements in function in a reasonable and predictable amount of time.     Precautions / Restrictions Precautions Precautions: Fall Restrictions Weight Bearing Restrictions: Yes RLE Weight Bearing: Weight bearing as tolerated      Mobility  Bed Mobility Overal bed mobility: Needs Assistance Bed Mobility: Supine to Sit      Supine to sit: Mod assist     General bed mobility comments: increased time, poor tolerance for moving RLE due to increasing pain    Transfers Overall transfer level: Needs assistance Equipment used: Rolling walker (2 wheels) Transfers: Sit to/from Stand, Bed to chair/wheelchair/BSC Sit to Stand: Mod assist, Min assist   Step pivot transfers: Min assist       General transfer comment: increased time, labored movement    Ambulation/Gait Ambulation/Gait assistance: Min assist, Mod assist Gait Distance (Feet): 8 Feet Assistive device: Rolling walker (2 wheels) Gait Pattern/deviations: Decreased step length - right, Decreased step length - left, Decreased stance time - right, Decreased stride length, Antalgic Gait velocity: decreased     General Gait Details: limited to a few slow labored steps at bedside due to fatigue and right hip pain  Stairs            Wheelchair Mobility     Tilt Bed    Modified Rankin (Stroke Patients Only)       Balance Overall balance assessment: Needs assistance Sitting-balance support: Feet supported, No upper extremity supported Sitting balance-Leahy Scale: Fair Sitting balance - Comments: fair to good seated at EOB   Standing balance support: Bilateral upper extremity supported, During functional activity, Reliant on assistive device for balance Standing balance-Leahy Scale: Poor Standing balance comment: using RW                             Pertinent Vitals/Pain Pain Assessment Pain Assessment: Faces Faces Pain Scale: Hurts little more Pain Location: right hip with movement Pain Descriptors / Indicators:  Sore, Guarding, Grimacing Pain Intervention(s): Limited activity within patient's tolerance, Monitored during session, Premedicated before session, Repositioned    Home Living Family/patient expects to be discharged to:: Private residence Living Arrangements: Spouse/significant other Available Help at  Discharge: Family;Available 24 hours/day Type of Home: House Home Access: Stairs to enter Entrance Stairs-Rails: None Entrance Stairs-Number of Steps: 1   Home Layout: One level Home Equipment: Agricultural consultant (2 wheels);Rollator (4 wheels);Cane - quad;Cane - single point;BSC/3in1      Prior Function Prior Level of Function : Needs assist;History of Falls (last six months)       Physical Assist : ADLs (physical);Mobility (physical) Mobility (physical): Bed mobility;Transfers;Gait;Stairs ADLs (physical): IADLs;Dressing;Bathing Mobility Comments: PRN use of cane and RW for ambulation. Mostly staying in the house the last few months. ADLs Comments: Assist for lower body ADL tasks. IADL assist.     Extremity/Trunk Assessment   Upper Extremity Assessment Upper Extremity Assessment: Defer to OT evaluation    Lower Extremity Assessment Lower Extremity Assessment: Generalized weakness;RLE deficits/detail RLE Deficits / Details: grossly 3+/5 RLE: Unable to fully assess due to pain RLE Sensation: WNL RLE Coordination: WNL    Cervical / Trunk Assessment Cervical / Trunk Assessment: Kyphotic  Communication   Communication Communication: No apparent difficulties  Cognition Arousal: Alert Behavior During Therapy: WFL for tasks assessed/performed Overall Cognitive Status: Within Functional Limits for tasks assessed                                          General Comments General comments (skin integrity, edema, etc.): Report of dizziness when first sitting at EOB.    Exercises     Assessment/Plan    PT Assessment Patient needs continued PT services  PT Problem List Decreased strength;Decreased activity tolerance;Decreased balance;Decreased mobility       PT Treatment Interventions DME instruction;Gait training;Stair training;Functional mobility training;Therapeutic activities;Therapeutic exercise;Balance training;Patient/family education    PT Goals  (Current goals can be found in the Care Plan section)  Acute Rehab PT Goals Patient Stated Goal: return home after rehab PT Goal Formulation: With patient/family Time For Goal Achievement: 07/25/23 Potential to Achieve Goals: Good    Frequency Min 4X/week     Co-evaluation PT/OT/SLP Co-Evaluation/Treatment: Yes Reason for Co-Treatment: To address functional/ADL transfers PT goals addressed during session: Mobility/safety with mobility;Balance;Proper use of DME OT goals addressed during session: ADL's and self-care       AM-PAC PT "6 Clicks" Mobility  Outcome Measure Help needed turning from your back to your side while in a flat bed without using bedrails?: A Lot Help needed moving from lying on your back to sitting on the side of a flat bed without using bedrails?: A Lot Help needed moving to and from a bed to a chair (including a wheelchair)?: A Little Help needed standing up from a chair using your arms (e.g., wheelchair or bedside chair)?: A Little Help needed to walk in hospital room?: A Lot Help needed climbing 3-5 steps with a railing? : A Lot 6 Click Score: 14    End of Session   Activity Tolerance: Patient tolerated treatment well;Patient limited by fatigue Patient left: in chair;with call bell/phone within reach;with family/visitor present Nurse Communication: Mobility status PT Visit Diagnosis: Unsteadiness on feet (R26.81);Other abnormalities of gait and mobility (R26.89);Muscle weakness (generalized) (M62.81)    Time: 4098-1191 PT Time Calculation (min) (ACUTE ONLY): 24 min   Charges:  PT Evaluation $PT Eval Moderate Complexity: 1 Mod PT Treatments $Therapeutic Activity: 23-37 mins PT General Charges $$ ACUTE PT VISIT: 1 Visit         12:29 PM, 07/11/23 Ocie Bob, MPT Physical Therapist with [facility] [phone] office [phone] mobile phone

## 2023-07-11 NOTE — Plan of Care (Signed)

## 2023-07-11 NOTE — Plan of Care (Signed)
  Problem: Acute Rehab OT Goals (only OT should resolve) Goal: Pt. Will Perform Lower Body Bathing Flowsheets (Taken 07/11/2023 0957) Pt Will Perform Lower Body Bathing:  with min assist  with adaptive equipment Goal: Pt. Will Perform Lower Body Dressing Flowsheets (Taken 07/11/2023 0957) Pt Will Perform Lower Body Dressing:  with min assist  with adaptive equipment  sitting/lateral leans Goal: Pt. Will Transfer To Toilet Flowsheets (Taken 07/11/2023 0957) Pt Will Transfer to Toilet:  with supervision  ambulating Goal: Pt. Will Perform Toileting-Clothing Manipulation Flowsheets (Taken 07/11/2023 0957) Pt Will Perform Toileting - Clothing Manipulation and hygiene:  with modified independence  sitting/lateral leans Goal: Pt/Caregiver Will Perform Home Exercise Program Flowsheets (Taken 07/11/2023 0957) Pt/caregiver will Perform Home Exercise Program:  Increased strength  Independently  Both right and left upper extremity  Geroldine Esquivias OT, MOT

## 2023-07-11 NOTE — Progress Notes (Signed)
Lab called stating patients HGB trended down to 9.5, Dr. Thomes Dinning made aware, no new orders at this time.

## 2023-07-11 NOTE — TOC Progression Note (Addendum)
Transition of Care [facility]) - Progression Note    Patient Details  Name: Tanya Mueller MRN: [id number] Date of Birth: Jun 09, 1935  [facility] CM/SW Contact  Villa Herb, [place] Phone Number: 07/11/2023, 10:36 AM  Clinical Narrative:    CSW updated by PT that they are recommending SNF for pt at D/C. CSW spoke with pts daughter who is at bedside with pt and they are both agreeable to SNF referral being sent out. CSW review facilities in the area and CMS ratings. Pt and daughter would like for referral to be sent to [facility] for review. CSW to send out and start insurance auth.    Addendum 1pm: CSW reviewed bed offers with pt and family, they have accepted bed offer at [facility]. CSW updated Destiny in admission of bed choice. CSW called to start insurance auth, VM left requesting call back.   Addendum 1:40pm: SNF insurance [employer] has been started at this time. TOC to follow.   Addendum 4pm: Pts insurance [employer] has been approved for SNF, auth ID is [id number]. EMS [employer] has also been approved, auth ID is [id number]. CSW updated Destiny in admissions at [facility] details.   Expected Discharge Plan:  (decision to be made once PT has worked with pt.) Barriers to Discharge: Continued Medical Work up  Expected Discharge Plan and Services In-house Referral: Clinical Social Work Discharge Planning Services: CM Consult   Living arrangements for the past 2 months: Single Family Home                                       Social Determinants of Health (SDOH) Interventions SDOH Screenings   Food Insecurity: No Food Insecurity (07/09/2023)  Housing: Low Risk  (07/09/2023)  Transportation Needs: No Transportation Needs (07/09/2023)  Utilities: Not At Risk (07/09/2023)  Alcohol Screen: Low Risk  (12/14/2022)  Depression (PHQ2-9): Low Risk  (12/13/2022)  Financial Resource Strain: Low Risk  (12/14/2022)  Physical Activity: Inactive (12/14/2022)  Social Connections: Socially Integrated  (12/14/2022)  Stress: No Stress Concern Present (12/14/2022)  Tobacco Use: Low Risk  (07/10/2023)    Readmission Risk Interventions     No data to display

## 2023-07-12 DIAGNOSIS — I5032 Chronic diastolic (congestive) heart failure: Secondary | ICD-10-CM | POA: Diagnosis not present

## 2023-07-12 DIAGNOSIS — F331 Major depressive disorder, recurrent, moderate: Secondary | ICD-10-CM | POA: Diagnosis not present

## 2023-07-12 DIAGNOSIS — I10 Essential (primary) hypertension: Secondary | ICD-10-CM | POA: Diagnosis not present

## 2023-07-12 DIAGNOSIS — T402X5A Adverse effect of other opioids, initial encounter: Secondary | ICD-10-CM | POA: Diagnosis not present

## 2023-07-12 DIAGNOSIS — F32A Depression, unspecified: Secondary | ICD-10-CM | POA: Diagnosis not present

## 2023-07-12 DIAGNOSIS — S7291XA Unspecified fracture of right femur, initial encounter for closed fracture: Secondary | ICD-10-CM | POA: Diagnosis not present

## 2023-07-12 DIAGNOSIS — K5903 Drug induced constipation: Secondary | ICD-10-CM | POA: Diagnosis not present

## 2023-07-12 DIAGNOSIS — Z9181 History of falling: Secondary | ICD-10-CM | POA: Diagnosis not present

## 2023-07-12 DIAGNOSIS — R2689 Other abnormalities of gait and mobility: Secondary | ICD-10-CM | POA: Diagnosis not present

## 2023-07-12 DIAGNOSIS — M62561 Muscle wasting and atrophy, not elsewhere classified, right lower leg: Secondary | ICD-10-CM | POA: Diagnosis not present

## 2023-07-12 DIAGNOSIS — G894 Chronic pain syndrome: Secondary | ICD-10-CM | POA: Diagnosis not present

## 2023-07-12 DIAGNOSIS — D72829 Elevated white blood cell count, unspecified: Secondary | ICD-10-CM | POA: Diagnosis not present

## 2023-07-12 DIAGNOSIS — M62562 Muscle wasting and atrophy, not elsewhere classified, left lower leg: Secondary | ICD-10-CM | POA: Diagnosis not present

## 2023-07-12 DIAGNOSIS — F1120 Opioid dependence, uncomplicated: Secondary | ICD-10-CM | POA: Diagnosis not present

## 2023-07-12 DIAGNOSIS — Z7901 Long term (current) use of anticoagulants: Secondary | ICD-10-CM | POA: Diagnosis not present

## 2023-07-12 DIAGNOSIS — M419 Scoliosis, unspecified: Secondary | ICD-10-CM | POA: Diagnosis not present

## 2023-07-12 DIAGNOSIS — S72011D Unspecified intracapsular fracture of right femur, subsequent encounter for closed fracture with routine healing: Secondary | ICD-10-CM | POA: Diagnosis not present

## 2023-07-12 DIAGNOSIS — M25551 Pain in right hip: Secondary | ICD-10-CM | POA: Diagnosis not present

## 2023-07-12 DIAGNOSIS — I480 Paroxysmal atrial fibrillation: Secondary | ICD-10-CM | POA: Diagnosis not present

## 2023-07-12 DIAGNOSIS — S72001D Fracture of unspecified part of neck of right femur, subsequent encounter for closed fracture with routine healing: Secondary | ICD-10-CM | POA: Diagnosis not present

## 2023-07-12 DIAGNOSIS — M549 Dorsalgia, unspecified: Secondary | ICD-10-CM | POA: Diagnosis not present

## 2023-07-12 DIAGNOSIS — Z9889 Other specified postprocedural states: Secondary | ICD-10-CM | POA: Insufficient documentation

## 2023-07-12 LAB — BASIC METABOLIC PANEL
Anion gap: 9 (ref 5–15)
BUN: 9 mg/dL (ref 8–23)
CO2: 24 mmol/L (ref 22–32)
Calcium: 8.2 mg/dL — ABNORMAL LOW (ref 8.9–10.3)
Chloride: 101 mmol/L (ref 98–111)
Creatinine, Ser: 0.63 mg/dL (ref 0.44–1.00)
GFR, Estimated: >60 mL/min (ref >60)
Glucose, Bld: 101 mg/dL — ABNORMAL HIGH (ref 70–99)
Potassium: 3 mmol/L — ABNORMAL LOW (ref 3.5–5.1)
Sodium: 134 mmol/L — ABNORMAL LOW (ref 135–145)

## 2023-07-12 LAB — CBC
HCT: 28.2 % — ABNORMAL LOW (ref 36.0–46.0)
Hemoglobin: 9.5 g/dL — ABNORMAL LOW (ref 12.0–15.0)
MCH: 35.3 pg — ABNORMAL HIGH (ref 26.0–34.0)
MCHC: 33.7 g/dL (ref 30.0–36.0)
MCV: 104.8 fL — ABNORMAL HIGH (ref 80.0–100.0)
Platelets: 235 10*3/uL (ref 150–400)
RBC: 2.69 MIL/uL — ABNORMAL LOW (ref 3.87–5.11)
RDW: 12.6 % (ref 11.5–15.5)
WBC: 9 10*3/uL (ref 4.0–10.5)
nRBC: 0 % (ref 0.0–0.2)

## 2023-07-12 MED ORDER — METHOCARBAMOL 500 MG PO TABS: 500.0000 mg | ORAL_TABLET | Freq: Three times a day (TID) | ORAL | Status: DC | PRN

## 2023-07-12 MED ORDER — OXYCODONE HCL 15 MG PO TABS: 15.0000 mg | ORAL_TABLET | Freq: Four times a day (QID) | ORAL | 0 refills | Status: DC | PRN

## 2023-07-12 MED ORDER — LOSARTAN POTASSIUM 50 MG PO TABS: 50.0000 mg | ORAL_TABLET | Freq: Every day | ORAL | 3 refills | Status: AC

## 2023-07-12 MED ORDER — POTASSIUM CHLORIDE CRYS ER 10 MEQ PO TBCR: 10.0000 meq | EXTENDED_RELEASE_TABLET | Freq: Every day | ORAL | Status: AC | PRN

## 2023-07-12 MED ORDER — FUROSEMIDE 20 MG PO TABS: 20.0000 mg | ORAL_TABLET | Freq: Every day | ORAL | Status: AC | PRN

## 2023-07-12 NOTE — Plan of Care (Signed)

## 2023-07-12 NOTE — TOC Transition Note (Addendum)
Transition of Care [facility]) - CM/SW Discharge Note   Patient Details  Name: Tanya Mueller MRN: [id number] Date of Birth: 11/29/1934  Transition [facility] Care [facility]) CM/SW Contact:  Erin Sons, LCSW Phone Number: 07/12/2023, 11:46 AM   Clinical Narrative:      Per MD patient ready for DC to [facility]. RN, patient, patient's family, and facility notified of DC. Discharge Summary and FL2 sent to facility. RN to call report prior to discharge [phone]). DC packet on chart. Ambulance transport requested for patient.   CSW will sign off for now as social work intervention is no longer needed. Please consult [place] again if new needs arise.   Final next level of care: Skilled Nursing Facility Barriers to Discharge: No Barriers Identified   Patient Goals and CMS Choice CMS Medicare.gov Compare Post Acute Care list provided to:: Patient Choice offered to / list presented to : Patient, Adult Children  Discharge Placement                Patient chooses bed at:  [facility]) Patient to be transferred to facility by: Daughter Name of family member notified: Daughter Patient and family notified of of transfer: 07/12/23  Discharge Plan and Services Additional resources added to the After Visit Summary for   In-house Referral: Clinical Social Work Discharge Planning Services: CM Consult                                 Social Determinants of Health (SDOH) Interventions SDOH Screenings   Food Insecurity: No Food Insecurity (07/09/2023)  Housing: Low Risk  (07/09/2023)  Transportation Needs: No Transportation Needs (07/09/2023)  Utilities: Not At Risk (07/09/2023)  Alcohol Screen: Low Risk  (12/14/2022)  Depression (PHQ2-9): Low Risk  (12/13/2022)  Financial Resource Strain: Low Risk  (12/14/2022)  Physical Activity: Inactive (12/14/2022)  Social Connections: Socially Integrated (12/14/2022)  Stress: No Stress Concern Present (12/14/2022)  Tobacco Use: Low Risk  (07/10/2023)      Readmission Risk Interventions     No data to display

## 2023-07-12 NOTE — Discharge Summary (Signed)
Physician Discharge Summary   Patient: Tanya Mueller MRN: [id number] DOB: March 02, 1935  Admit date:     07/09/2023  Discharge date: 07/12/23  Discharge Physician: Vassie Loll   PCP: Estanislado Pandy, MD   Recommendations at discharge:  Repeat CBC in 5 days to follow hemoglobin trend/stability Repeat basic metabolic panel in 5 days to follow electrolytes and renal function. Outpatient follow-up with orthopedic service (Dr. Romeo Apple) in 4 weeks. Reassess blood pressure and adjust antihypertensive treatment as needed.  Discharge Diagnoses: Principal Problem:   Hip pain, acute, right Active Problems:   Essential hypertension   Therapeutic opioid-induced constipation (OIC)   Paroxysmal atrial fibrillation with RVR (HCC)   (HFpEF) heart failure with preserved ejection fraction (HCC)  Brief hospital admission course:  88 y.o. female with pmhx relevant for chronic back pain/prior back surgeries, chronic opiate use, with h/o opioid-induced constipation, nephrolithiasis and HTN, history of paroxysmal A-fib currently on Eliquis for stroke prevention who presents from home after mechanical fall with Rt hip pain and is found to have right hip fracture.   Assessment and Plan: 1)Rt Hip Fx--suspect mechanical fall Right hip CT shows---Acute right subcapital femoral head/neck fracture with mild foreshortening. The right femoral head is seated within the acetabulum. S/p PERCUTANEOUS FIXATION OF FEMORAL NECK on 07/10/23 -Continue weightbearing as tolerated with a walker for 6 weeks -Immediate range of motion and out of bed Follow-up in 4 weeks for x-rays and further postoperative follow-up. -Safe to resume Eliquis on Thursday 07/12/23 am dose. -Duration by physical therapy plan is for skilled nursing facility rehabilitation; TOC on more helping with placement. -Continue the use of adjusted dose oxycodone, scheduled Tylenol and Robaxin for pain management.   2)Leukocytosis--- previously 11.7 probably  reactive in the setting of mechanical fall with hip fracture -No signs of acute infection appreciated.   3) chronic back pain/chronic use of opiates -Prior back surgeries---now also having pain in her hip after fracture. --Lumbar x-rays show Postsurgical changes L2-S1 posterior lumbosacral fusion. No acute fracture or hardware complicating feature is identified,  -Continue pain control with opiates.   4)H/o opioid-induced constipation--- due to #3 above- -continue laxatives -Maintain adequate hydration and good amount of fiber intake in diet.[name]   5)PAFib-history of paroxysmal A-fib -CHADSVASC score 3-4 -May use Cardizem as needed elevated HR -Resume Eliquis on Thursday 07/12/23 am dose. -Rate controlled and is stable   6)HTN--- continue losartan -Continue heart healthy/low-sodium diet -Continue adjusted dose of home antihypertensive agents.  7) chronic diastolic heart failure -Stable and compensated -Continue low-sodium diet, adequate hydration and daily weights. -Continue patient follow-up with cardiology service -Continue the use of as needed Lasix.  Consultants: Orthopedic service. Procedures performed: Status post ORIF (07/10/2023) Disposition: Skilled nursing facility Diet recommendation: Heart healthy/low-sodium diet.  DISCHARGE MEDICATION: Allergies as of 07/12/2023       Reactions   Morphine Other (See Comments)   Nervous        Medication List     STOP taking these medications    acetaminophen 325 MG tablet Commonly known as: TYLENOL   oxyCODONE-acetaminophen 10-325 MG tablet Commonly known as: PERCOCET       TAKE these medications    apixaban 5 MG Tabs tablet Commonly known as: ELIQUIS Take 1 tablet (5 mg total) by mouth 2 (two) times daily.   diltiazem 60 MG tablet Commonly known as: Cardizem Take 1 tablet (60 mg total) by mouth every 8 (eight) hours as needed (palpitations).   fluticasone 50 MCG/ACT nasal spray Commonly known as:  FLONASE Place 2 sprays into the nose daily as needed for allergies.   furosemide 20 MG tablet Commonly known as: LASIX Take 1 tablet (20 mg total) by mouth daily as needed for fluid or edema.   losartan 50 MG tablet Commonly known as: COZAAR Take 1 tablet (50 mg total) by mouth daily. What changed: Another medication with the same name was removed. Continue taking this medication, and follow the directions you see here.   methocarbamol 500 MG tablet Commonly known as: ROBAXIN Take 1 tablet (500 mg total) by mouth every 8 (eight) hours as needed for muscle spasms.   Motegrity 2 MG Tabs Generic drug: Prucalopride Succinate Take 1 tablet (2 mg total) by mouth daily.   oxyCODONE 15 MG immediate release tablet Commonly known as: ROXICODONE Take 1 tablet (15 mg total) by mouth every 6 (six) hours as needed for severe pain. What changed:  medication strength how much to take when to take this reasons to take this   polyethylene glycol 17 g packet Commonly known as: MIRALAX / GLYCOLAX Take 17 g by mouth daily as needed for moderate constipation.   potassium chloride 10 MEQ tablet Commonly known as: KLOR-CON M Take 1 tablet (10 mEq total) by mouth daily as needed (with Lasix).   sertraline 50 MG tablet Commonly known as: ZOLOFT Take 75 mg by mouth daily.        Contact information for follow-up providers     Sasser, Clarene Critchley, MD. Schedule an appointment as soon as possible for a visit in 10 day(s).   Specialty: Family Medicine Why: after discharge from SNF Contact information: [address] [place] [address] [phone]              Contact information for after-discharge care     Destination     [facility] Preferred SNF .   Service: Skilled Nursing Contact information: [address] [place] [address] [phone]                    Discharge Exam: Ceasar Mons Weights   07/09/23 1731 07/10/23 1043  Weight: 59.7  kg 59.7 kg   General exam: Alert, awake, oriented x 3; reporting pain in her rib cage, back and right hip are improved.[name] Respiratory system: Clear to auscultation. Respiratory effort normal.  Good saturation on room air. Cardiovascular system:RRR. No rubs or gallops; no JVD. Gastrointestinal system: Abdomen is nondistended, soft and nontender. No organomegaly or masses felt. Normal bowel sounds heard. Central nervous system: Alert and oriented. No focal neurological deficits. Extremities: No cyanosis or clubbing. Skin: No petechiae. Psychiatry: Judgement and insight appear normal. Mood & affect appropriate.   Condition at discharge: Stable and improved.  The results of significant diagnostics from this hospitalization (including imaging, microbiology, ancillary and laboratory) are listed below for reference.   Imaging Studies: DG Chest Port 1 View  Result Date: 07/11/2023 CLINICAL DATA:  88 year old female with bilateral rib and chest pain. EXAM: PORTABLE CHEST 1 VIEW COMPARISON:  Portable chest 12/10/2022 and earlier. FINDINGS: Portable AP semi upright view at 0651 hours. Partially visible chronic upper lumbar spine fusion hardware. Large lung volumes. Regressed and largely resolved bilateral pleural effusions since March. Stable mild cardiomegaly. Other mediastinal contours are within normal limits. Visualized tracheal air column is within normal limits. No pneumothorax or pulmonary edema. No consolidation. Possible small residual pleural effusions. Negative visible bowel gas. Osteopenia. No acute osseous abnormality identified. IMPRESSION: 1. Substantial regression of bilateral  pleural effusions since March, possible trace residual. 2. No new cardiopulmonary abnormality. No acute osseous abnormality identified. Electronically Signed   By: Odessa Fleming M.D.   On: 07/11/2023 07:09   DG HIP UNILAT WITH PELVIS 2-3 VIEWS RIGHT  Result Date: 07/10/2023 CLINICAL DATA:  Fracture EXAM: DG HIP (WITH OR  WITHOUT PELVIS) 2-3V RIGHT COMPARISON:  the previous day's study FINDINGS: A series of fluoroscopic intraoperative images. The subcapital fracture seen on prior imaging less conspicuous. Images document placement of 3 pins across the femoral neck. Near anatomic alignment. IMPRESSION: Instrumented fixation right femoral neck fracture. Electronically Signed   By: Corlis Leak M.D.   On: 07/10/2023 17:41   DG C-Arm 1-60 Min  Result Date: 07/10/2023 CLINICAL DATA:  Hip pain EXAM: DG C-ARM 1-60 MIN COMPARISON:  CT from previous day FINDINGS: Minimally displaced subcapital femoral fracture is less conspicuous on these fluoroscopic spot images, which document placement of 2 orthopedic pins across the femoral neck. IMPRESSION: Orthopedic fixation of subcapital femoral fracture. Electronically Signed   By: Corlis Leak M.D.   On: 07/10/2023 17:39   DG HIP UNILAT WITH PELVIS 2-3 VIEWS RIGHT  Result Date: 07/10/2023 CLINICAL DATA:  Status post hardware fixation of the right femoral neck fracture. EXAM: DG HIP (WITH OR WITHOUT PELVIS) 2-3V RIGHT COMPARISON:  Right hip CT dated 07/09/2023. FINDINGS: Inner or placement of 3 screws bridging the right femoral neck fracture with anatomic position and alignment of the major fragments in the frontal projection, difficult to visualize in the lateral projection due to overlying soft tissues. Diffuse osteopenia. Lumbar spine fixation hardware. IMPRESSION: Status post internal fixation of the right femoral neck fracture with anatomic position and alignment of the major fragments in the frontal projection. Electronically Signed   By: Beckie Salts M.D.   On: 07/10/2023 15:57   CT Hip Right Wo Contrast  Result Date: 07/09/2023 CLINICAL DATA:  Fracture, hip EXAM: CT OF THE RIGHT HIP WITHOUT CONTRAST TECHNIQUE: Multidetector CT imaging of the right hip was performed according to the standard protocol. Multiplanar CT image reconstructions were also generated. RADIATION DOSE REDUCTION:  This exam was performed according to the departmental dose-optimization program which includes automated exposure control, adjustment of the mA and/or kV according to patient size and/or use of iterative reconstruction technique. COMPARISON:  Same-day radiograph of the right hip at 11:27 a.m. FINDINGS: Bones/Joint/Cartilage There is an acute fracture of the right subcapital femoral head/neck with mild foreshortening. The right femoral head is seated within the acetabulum. No additional acute fracture identified. Remote healed right inferior pubic ramus fracture. The right sacroiliac joint and pubic symphysis are anatomically aligned. Degenerative changes of the right hip. Chondrocalcinosis of the pubic symphysis and right hip. Partially visualized lumbosacral fusion hardware. Ligaments Suboptimally assessed by CT. Soft tissues/Muscles. Soft tissue swelling overlying the right lateral hip is noted. No discrete fluid collection or hematoma identified. No enlarged lymph nodes identified in the field of view. The visualized intra-abdominal contents are unremarkable. IMPRESSION: 1. Acute right subcapital femoral head/neck fracture with mild foreshortening. The right femoral head is seated within the acetabulum. 2. Remote healed right inferior pubic ramus fracture. Electronically Signed   By: Hart Robinsons M.D.   On: 07/09/2023 16:35   DG HIP UNILAT WITH PELVIS 2-3 VIEWS RIGHT  Result Date: 07/09/2023 CLINICAL DATA:  144615 Pain 144615 EXAM: DG HIP (WITH OR WITHOUT PELVIS) 2-3V RIGHT COMPARISON:  None Available. FINDINGS: There is subtle cortical irregularity along the medial border of the right femoral neck,  along with relative foreshortening of the femoral neck relative to the contralateral side. Advanced osteopenia. Soft tissues unremarkable. IMPRESSION: Subtle cortical irregularity and foreshortening of the right femoral neck raises suspicion for impacted/displaced fracture. Further evaluation with  cross-sectional imaging is advised. Electronically Signed   By: Olive Bass M.D.   On: 07/09/2023 13:23   DG Lumbar Spine Complete  Result Date: 07/09/2023 CLINICAL DATA:  fall EXAM: LUMBAR SPINE - COMPLETE 4+ VIEW COMPARISON:  None Available. FINDINGS: Status post L2-S1 posterior lumbosacral fusion. Hardware alignment appears grossly within normal limits findings of hardware fracture. No compression deformity identified. Advanced osteopenia. Paravertebral soft tissues are IMPRESSION: Postsurgical changes L2-S1 posterior lumbosacral fusion. No acute fracture or hardware complicating feature is identified, exam is somewhat limited advanced osteopenia. Consider CT for further evaluation if appropriate. Electronically Signed   By: Olive Bass M.D.   On: 07/09/2023 13:17   CT Head Wo Contrast  Result Date: 07/09/2023 CLINICAL DATA:  Neck trauma.  Fall.  On blood thinner. EXAM: CT HEAD WITHOUT CONTRAST CT CERVICAL SPINE WITHOUT CONTRAST TECHNIQUE: Multidetector CT imaging of the head and cervical spine was performed following the standard protocol without intravenous contrast. Multiplanar CT image reconstructions of the cervical spine were also generated. RADIATION DOSE REDUCTION: This exam was performed according to the departmental dose-optimization program which includes automated exposure control, adjustment of the mA and/or kV according to patient size and/or use of iterative reconstruction technique. COMPARISON:  None Available. FINDINGS: CT HEAD FINDINGS Brain: No evidence of acute infarction, hemorrhage, hydrocephalus, extra-axial collection or mass lesion/mass effect. Vascular: No hyperdense vessel or unexpected calcification. Skull: Normal. Negative for fracture or focal lesion. Sinuses/Orbits: No evidence of injury. Mild mucosal thickening in the paranasal sinuses. CT CERVICAL SPINE FINDINGS Alignment: Normal. Skull base and vertebrae: No acute fracture. No primary bone lesion or focal pathologic  process. Soft tissues and spinal canal: No prevertebral fluid or swelling. No visible canal hematoma. Goiter Disc levels:  Much less than typical degenerative change for age. Upper chest: No evidence of injury IMPRESSION: No evidence of intracranial or cervical spine injury. Electronically Signed   By: Tiburcio Pea M.D.   On: 07/09/2023 12:06   CT Cervical Spine Wo Contrast  Result Date: 07/09/2023 CLINICAL DATA:  Neck trauma.  Fall.  On blood thinner. EXAM: CT HEAD WITHOUT CONTRAST CT CERVICAL SPINE WITHOUT CONTRAST TECHNIQUE: Multidetector CT imaging of the head and cervical spine was performed following the standard protocol without intravenous contrast. Multiplanar CT image reconstructions of the cervical spine were also generated. RADIATION DOSE REDUCTION: This exam was performed according to the departmental dose-optimization program which includes automated exposure control, adjustment of the mA and/or kV according to patient size and/or use of iterative reconstruction technique. COMPARISON:  None Available. FINDINGS: CT HEAD FINDINGS Brain: No evidence of acute infarction, hemorrhage, hydrocephalus, extra-axial collection or mass lesion/mass effect. Vascular: No hyperdense vessel or unexpected calcification. Skull: Normal. Negative for fracture or focal lesion. Sinuses/Orbits: No evidence of injury. Mild mucosal thickening in the paranasal sinuses. CT CERVICAL SPINE FINDINGS Alignment: Normal. Skull base and vertebrae: No acute fracture. No primary bone lesion or focal pathologic process. Soft tissues and spinal canal: No prevertebral fluid or swelling. No visible canal hematoma. Goiter Disc levels:  Much less than typical degenerative change for age. Upper chest: No evidence of injury IMPRESSION: No evidence of intracranial or cervical spine injury. Electronically Signed   By: Tiburcio Pea M.D.   On: 07/09/2023 12:06    Microbiology: Results for  orders placed or performed during the hospital  encounter of 07/09/23  Surgical PCR screen     Status: None   Collection Time: 07/09/23  6:34 PM   Specimen: Nasal Mucosa; Nasal Swab  Result Value Ref Range Status   MRSA, PCR NEGATIVE NEGATIVE Final   Staphylococcus aureus NEGATIVE NEGATIVE Final    Comment: (NOTE) The Xpert SA Assay (FDA approved for NASAL specimens in patients 26 years of age and older), is one component of a comprehensive surveillance program. It is not intended to diagnose infection nor to guide or monitor treatment. Performed at [facility], [address], [place], [place] [address]     Labs: CBC: Recent Labs  Lab 07/09/23 1248 07/11/23 0434 07/12/23 0439  WBC 11.7* 9.5 9.0  NEUTROABS 9.5*  --   --   HGB 13.2 9.5* 9.5*  HCT 38.9 28.7* 28.2*  MCV 103.2* 105.5* 104.8*  PLT 317 224 235   Basic Metabolic Panel: Recent Labs  Lab 07/09/23 1248 07/11/23 0434 07/12/23 0439  NA 141 136 134*  K 3.6 3.0* 3.0*  CL 103 102 101  CO2 26 25 24   GLUCOSE 122* 102* 101*  BUN 10 10 9   CREATININE 0.66 0.71 0.63  CALCIUM 9.1 8.0* 8.2*    Discharge time spent: greater than 30 minutes.  Signed: Vassie Loll, MD [facility] 07/12/2023

## 2023-07-12 NOTE — Care Management Important Message (Signed)
Important Message  Patient Details  Name: Tanya Mueller MRN: [id number] Date of Birth: 1935-05-02   Important Message Given:  Yes - Medicare IM     Corey Harold 07/12/2023, 11:39 AM

## 2023-07-12 NOTE — Progress Notes (Signed)
Subjective: 2 Days Post-Op Procedure(s) (LRB): PERCUTANEOUS FIXATION OF FEMORAL NECK (Right) Patient reports pain as significantly improved Objective: Vital signs in last 24 hours: Temp:  [98.1 F (36.7 C)-99.3 F (37.4 C)] 98.1 F (36.7 C) (10/10 0501) Pulse Rate:  [77-86] 77 (10/10 0501) Resp:  [18] 18 (10/10 0501) BP: (149-152)/(55-62) 152/62 (10/10 0501) SpO2:  [93 %-96 %] 93 % (10/10 0501)  Intake/Output from previous day: 10/09 0701 - 10/10 0700 In: 720 [P.O.:720] Out: 400 [Urine:400] Intake/Output this shift: No intake/output data recorded.  Recent Labs    07/09/23 1248 07/11/23 0434 07/12/23 0439  HGB 13.2 9.5* 9.5*   Recent Labs    07/11/23 0434 07/12/23 0439  WBC 9.5 9.0  RBC 2.72* 2.69*  HCT 28.7* 28.2*  PLT 224 235   Recent Labs    07/11/23 0434 07/12/23 0439  NA 136 134*  K 3.0* 3.0*  CL 102 101  CO2 25 24  BUN 10 9  CREATININE 0.71 0.63  GLUCOSE 102* 101*  CALCIUM 8.0* 8.2*   No results for input(s): "LABPT", "INR" in the last 72 hours.  She was awake she was alert she was sitting on side of the bed   Assessment/Plan: 2 Days Post-Op Procedure(s) (LRB): PERCUTANEOUS FIXATION OF FEMORAL NECK (Right) Advance diet D/C IV fluids Discharge to SNF      [facility] 07/12/2023, 7:46 AM

## 2023-07-13 ENCOUNTER — Encounter (HOSPITAL_COMMUNITY): Payer: Self-pay | Admitting: Orthopedic Surgery

## 2023-07-13 NOTE — Anesthesia Postprocedure Evaluation (Signed)
Anesthesia Post Note  Patient: Tanya Mueller  Procedure(s) Performed: PERCUTANEOUS FIXATION OF FEMORAL NECK (Right: Hip)  Patient location during evaluation: Phase II Anesthesia Type: General Level of consciousness: awake Pain management: pain level controlled Vital Signs Assessment: post-procedure vital signs reviewed and stable Respiratory status: spontaneous breathing and respiratory function stable Cardiovascular status: blood pressure returned to baseline and stable Postop Assessment: no headache and no apparent nausea or vomiting Anesthetic complications: no Comments: Late entry   No notable events documented.   Last Vitals:  Vitals:   07/11/23 1932 07/12/23 0501  BP: (!) 149/55 (!) 152/62  Pulse: 86 77  Resp: 18 18  Temp: 37.4 C 36.7 C  SpO2: 96% 93%    Last Pain:  Vitals:   07/12/23 1000  TempSrc:   PainSc: 6                  Windell Norfolk

## 2023-07-14 DIAGNOSIS — S72001D Fracture of unspecified part of neck of right femur, subsequent encounter for closed fracture with routine healing: Secondary | ICD-10-CM | POA: Diagnosis not present

## 2023-07-14 DIAGNOSIS — F331 Major depressive disorder, recurrent, moderate: Secondary | ICD-10-CM | POA: Diagnosis not present

## 2023-07-14 DIAGNOSIS — I5032 Chronic diastolic (congestive) heart failure: Secondary | ICD-10-CM | POA: Diagnosis not present

## 2023-07-14 DIAGNOSIS — I10 Essential (primary) hypertension: Secondary | ICD-10-CM | POA: Diagnosis not present

## 2023-07-16 NOTE — Consult Note (Signed)
[facility] Customer service manager [facility]) Accountable Care Organization (ACO) [facility] Liaison Note  07/16/2023  Tanya Mueller 1935/07/01 [id number]  Location: [facility] RN Hospital Liaison screened the patient remotely at [facility].  Insurance: [employer]   Tanya Mueller is a 88 y.o. female who is a Primary Care Patient of Estanislado Pandy, MD-[facility]. The patient was screened for  readmission hospitalization with noted medium risk score for unplanned readmission risk with 1 IP/1 ED in 6 months.  The patient was assessed for potential [facility]) Care Management service needs for post hospital transition for care coordination. Review of patient's electronic medical record reveals patient was admitted with Hip pain. Pt was discharged to [facility] for ongoing rehabilitation. Facility with continue to address pt's ongoing needs.   [facility] Care Management/Population Health does not replace or interfere with any arrangements made by the Inpatient Transition of Care team.   For questions contact:   Elliot Cousin, RN, [facility] Liaison [place]   Population Health Office Hours MTWF  8:00 am-6:00 pm [phone] mobile [phone] [Office toll free line] Office Hours are M-F 8:30 - 5 pm [email or web address]

## 2023-07-17 DIAGNOSIS — I480 Paroxysmal atrial fibrillation: Secondary | ICD-10-CM | POA: Diagnosis not present

## 2023-07-17 DIAGNOSIS — I10 Essential (primary) hypertension: Secondary | ICD-10-CM | POA: Diagnosis not present

## 2023-07-23 ENCOUNTER — Ambulatory Visit: Payer: PPO | Admitting: Gastroenterology

## 2023-07-27 ENCOUNTER — Other Ambulatory Visit: Payer: Self-pay | Admitting: Gastroenterology

## 2023-07-27 DIAGNOSIS — S7291XA Unspecified fracture of right femur, initial encounter for closed fracture: Secondary | ICD-10-CM | POA: Diagnosis not present

## 2023-07-27 DIAGNOSIS — T402X5A Adverse effect of other opioids, initial encounter: Secondary | ICD-10-CM

## 2023-07-27 DIAGNOSIS — K581 Irritable bowel syndrome with constipation: Secondary | ICD-10-CM

## 2023-07-28 DIAGNOSIS — F331 Major depressive disorder, recurrent, moderate: Secondary | ICD-10-CM | POA: Diagnosis not present

## 2023-07-28 DIAGNOSIS — I10 Essential (primary) hypertension: Secondary | ICD-10-CM | POA: Diagnosis not present

## 2023-07-28 DIAGNOSIS — S72001D Fracture of unspecified part of neck of right femur, subsequent encounter for closed fracture with routine healing: Secondary | ICD-10-CM | POA: Diagnosis not present

## 2023-07-28 DIAGNOSIS — I5032 Chronic diastolic (congestive) heart failure: Secondary | ICD-10-CM | POA: Diagnosis not present

## 2023-07-30 DIAGNOSIS — K5903 Drug induced constipation: Secondary | ICD-10-CM | POA: Diagnosis not present

## 2023-07-30 DIAGNOSIS — M80051D Age-related osteoporosis with current pathological fracture, right femur, subsequent encounter for fracture with routine healing: Secondary | ICD-10-CM | POA: Diagnosis not present

## 2023-07-30 DIAGNOSIS — Z7901 Long term (current) use of anticoagulants: Secondary | ICD-10-CM | POA: Diagnosis not present

## 2023-07-30 DIAGNOSIS — I4821 Permanent atrial fibrillation: Secondary | ICD-10-CM | POA: Diagnosis not present

## 2023-07-30 DIAGNOSIS — G8929 Other chronic pain: Secondary | ICD-10-CM | POA: Diagnosis not present

## 2023-07-30 DIAGNOSIS — T402X5D Adverse effect of other opioids, subsequent encounter: Secondary | ICD-10-CM | POA: Diagnosis not present

## 2023-07-30 DIAGNOSIS — Z87440 Personal history of urinary (tract) infections: Secondary | ICD-10-CM | POA: Diagnosis not present

## 2023-07-30 DIAGNOSIS — I110 Hypertensive heart disease with heart failure: Secondary | ICD-10-CM | POA: Diagnosis not present

## 2023-07-30 DIAGNOSIS — M545 Low back pain, unspecified: Secondary | ICD-10-CM | POA: Diagnosis not present

## 2023-07-30 DIAGNOSIS — Z9181 History of falling: Secondary | ICD-10-CM | POA: Diagnosis not present

## 2023-07-30 DIAGNOSIS — Z556 Problems related to health literacy: Secondary | ICD-10-CM | POA: Diagnosis not present

## 2023-07-30 DIAGNOSIS — I5030 Unspecified diastolic (congestive) heart failure: Secondary | ICD-10-CM | POA: Diagnosis not present

## 2023-08-08 DIAGNOSIS — I482 Chronic atrial fibrillation, unspecified: Secondary | ICD-10-CM | POA: Diagnosis not present

## 2023-08-08 DIAGNOSIS — I10 Essential (primary) hypertension: Secondary | ICD-10-CM | POA: Diagnosis not present

## 2023-08-08 DIAGNOSIS — Z0001 Encounter for general adult medical examination with abnormal findings: Secondary | ICD-10-CM | POA: Diagnosis not present

## 2023-08-08 DIAGNOSIS — S72001A Fracture of unspecified part of neck of right femur, initial encounter for closed fracture: Secondary | ICD-10-CM | POA: Insufficient documentation

## 2023-08-08 DIAGNOSIS — D649 Anemia, unspecified: Secondary | ICD-10-CM | POA: Diagnosis not present

## 2023-08-08 DIAGNOSIS — N183 Chronic kidney disease, stage 3 unspecified: Secondary | ICD-10-CM | POA: Diagnosis not present

## 2023-08-08 DIAGNOSIS — I5030 Unspecified diastolic (congestive) heart failure: Secondary | ICD-10-CM | POA: Diagnosis not present

## 2023-08-08 DIAGNOSIS — Z6820 Body mass index (BMI) 20.0-20.9, adult: Secondary | ICD-10-CM | POA: Diagnosis not present

## 2023-08-08 DIAGNOSIS — E7849 Other hyperlipidemia: Secondary | ICD-10-CM | POA: Diagnosis not present

## 2023-08-08 DIAGNOSIS — E876 Hypokalemia: Secondary | ICD-10-CM | POA: Diagnosis not present

## 2023-08-08 DIAGNOSIS — I4891 Unspecified atrial fibrillation: Secondary | ICD-10-CM | POA: Diagnosis not present

## 2023-08-09 ENCOUNTER — Ambulatory Visit: Payer: PPO | Admitting: Orthopedic Surgery

## 2023-08-09 ENCOUNTER — Other Ambulatory Visit (INDEPENDENT_AMBULATORY_CARE_PROVIDER_SITE_OTHER): Payer: Self-pay

## 2023-08-09 ENCOUNTER — Encounter: Payer: Self-pay | Admitting: Orthopedic Surgery

## 2023-08-09 DIAGNOSIS — S72001D Fracture of unspecified part of neck of right femur, subsequent encounter for closed fracture with routine healing: Secondary | ICD-10-CM

## 2023-08-09 DIAGNOSIS — Z23 Encounter for immunization: Secondary | ICD-10-CM | POA: Diagnosis not present

## 2023-08-09 NOTE — Progress Notes (Signed)
Postop visit #1 July 10, 2023 patient had open reduction internal fixation right hip with cannulated screws  X-rays show screws are in good position fracture is in good position  At the superolateral aspect of the x-ray there is some comminution which gives some concern for possible nonunion  However now the patient is in good condition walking with a walker  She should continue protected weightbearing with a walker until complete fracture healing  Follow-up in 6 weeks for repeat imaging  Wound check normal suture tails removed

## 2023-08-16 ENCOUNTER — Other Ambulatory Visit: Payer: Self-pay | Admitting: Internal Medicine

## 2023-08-16 IMAGING — CT CT ABD-PELV W/ CM
2 of 5 series · 16 of 46 positions shown, 18 images · IV contrast (Omnipaque or Isovue)
Comparison: 03/04/2019

CLINICAL DATA: Worsening left-sided abdominal pain for 1 week

EXAM:
CT ABDOMEN AND PELVIS WITH CONTRAST
TECHNIQUE: Multidetector CT imaging of the abdomen and pelvis was performed
using the standard protocol following bolus administration of
intravenous contrast.

[Series 2: axial st · axial · 0.92mm/px · z∈[+955,+1340]mm · 13 of 87 slices shown, 15 images]
[im 5/87  soft-tissue]
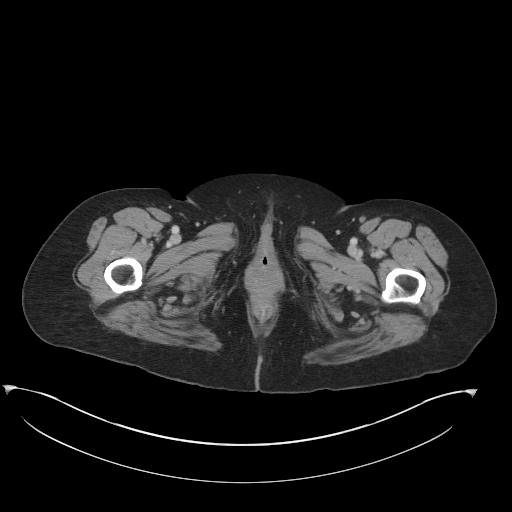
[im 5/87  bone]
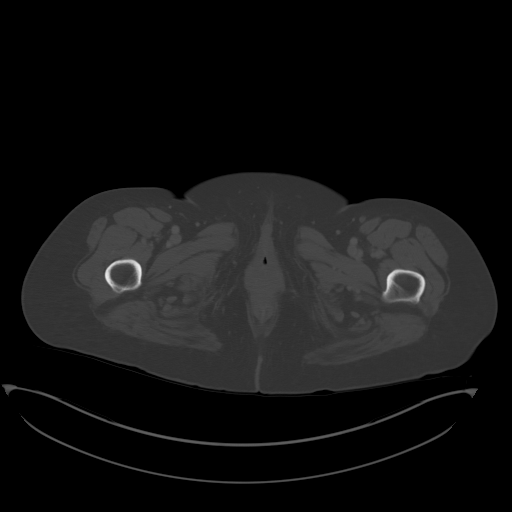
[im 10/87  soft-tissue]
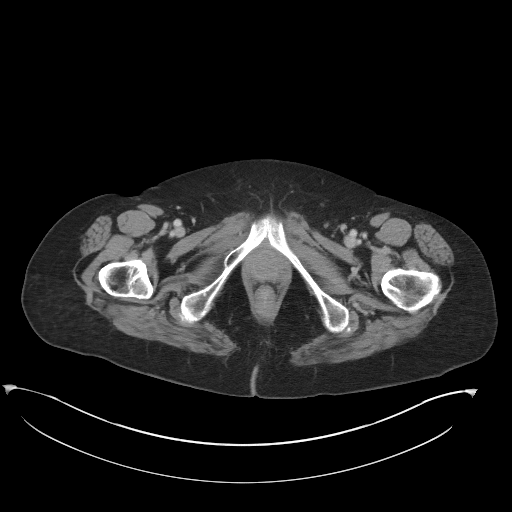
[im 20/87  soft-tissue]
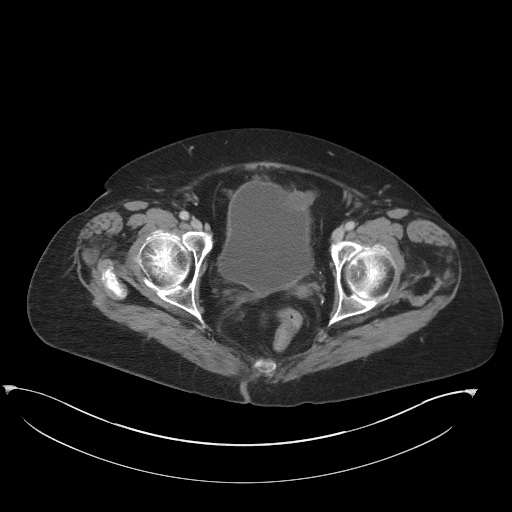
[im 24/87  soft-tissue]
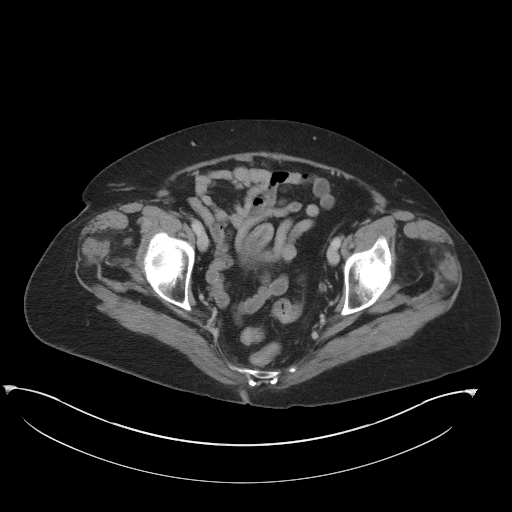
[im 29/87  soft-tissue]
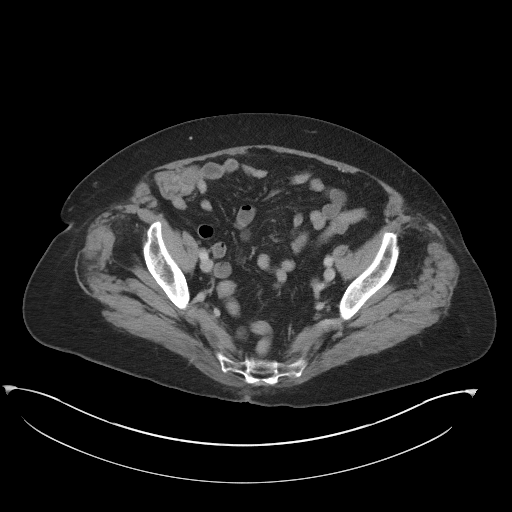
[im 39/87  soft-tissue]
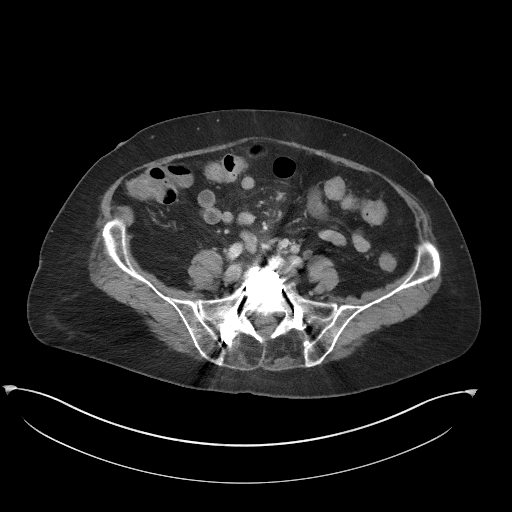
[im 44/87  soft-tissue]
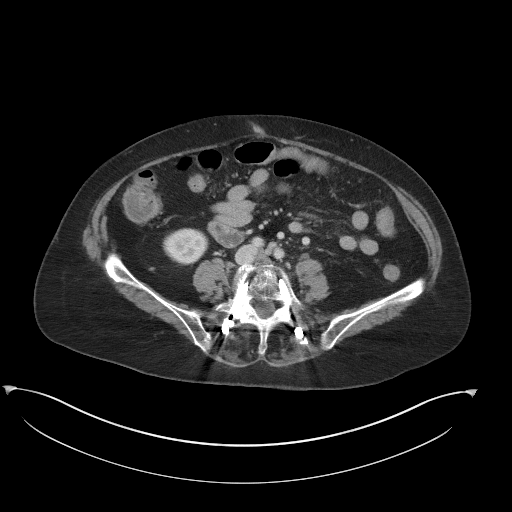
[im 48/87  soft-tissue]
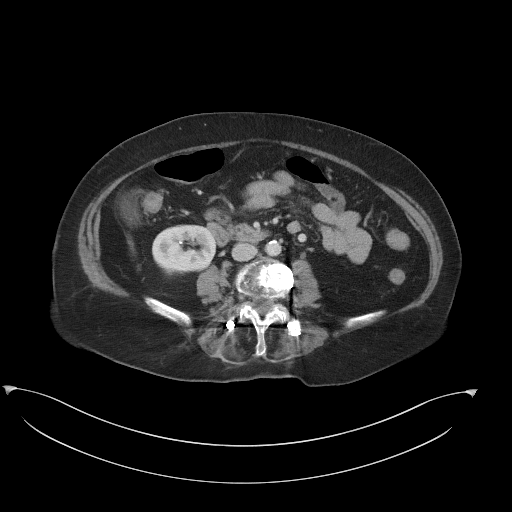
[im 58/87  soft-tissue]
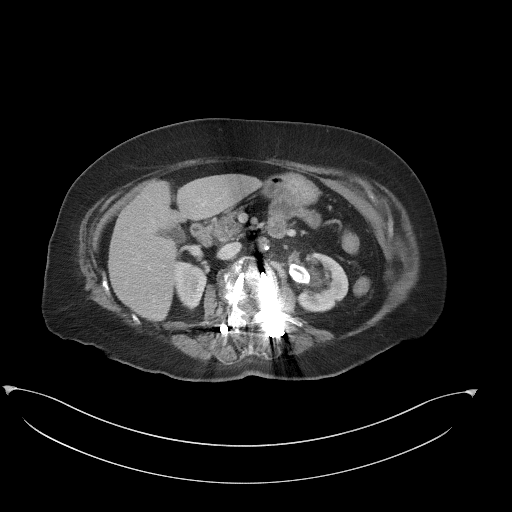
[im 58/87  bone]
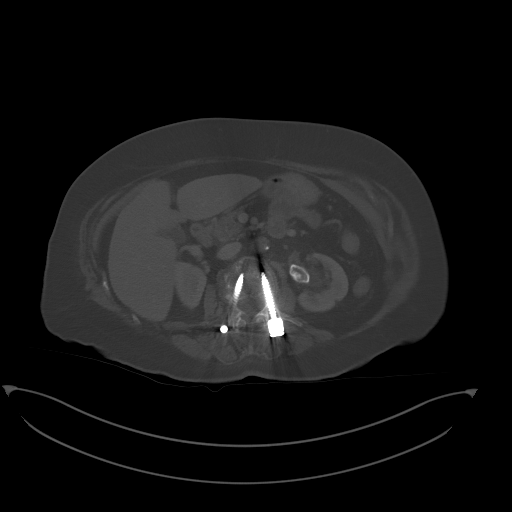
[im 63/87  soft-tissue]
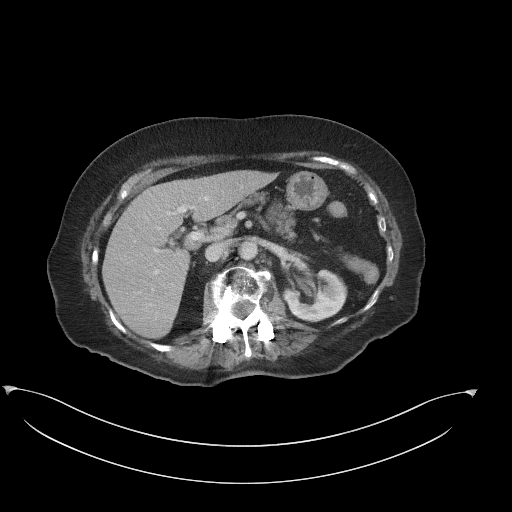
[im 67/87  soft-tissue]
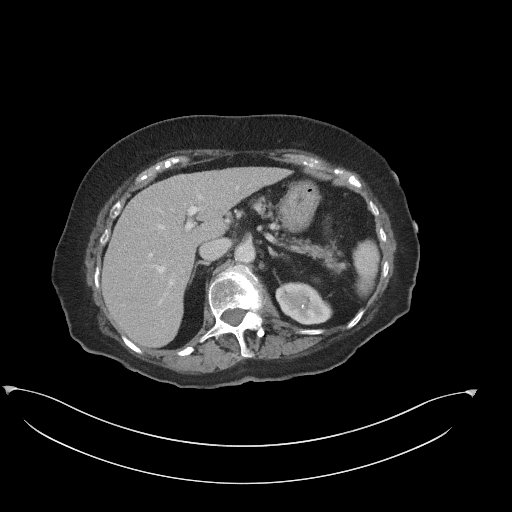
[im 77/87  soft-tissue]
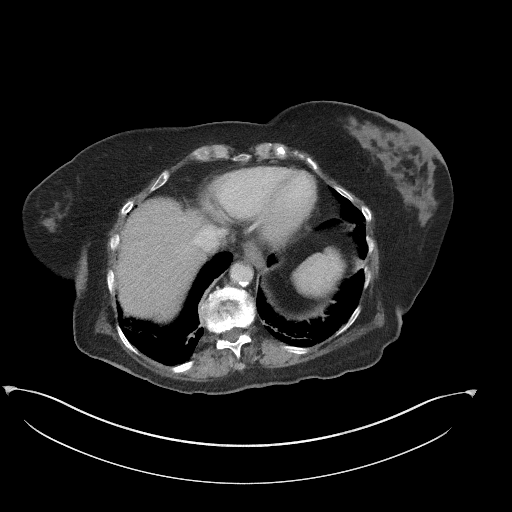
[im 82/87  soft-tissue]
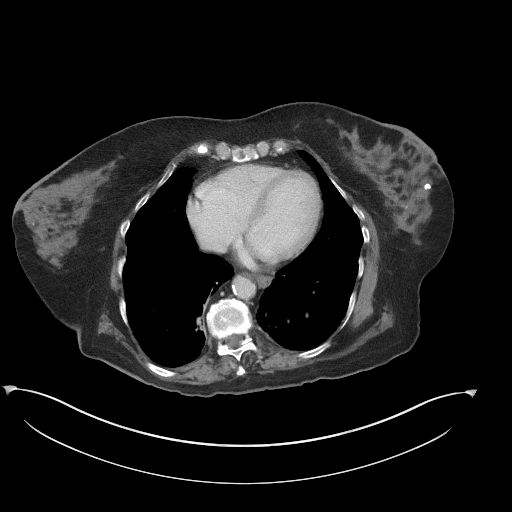

[Series 5: coronal st · coronal · 0.75mm/px · 3 of 100 slices shown]
[im 34/100  soft-tissue]
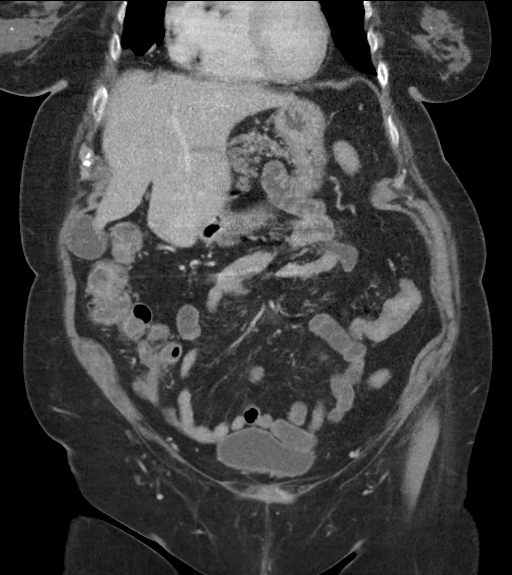
[im 45/100  soft-tissue]
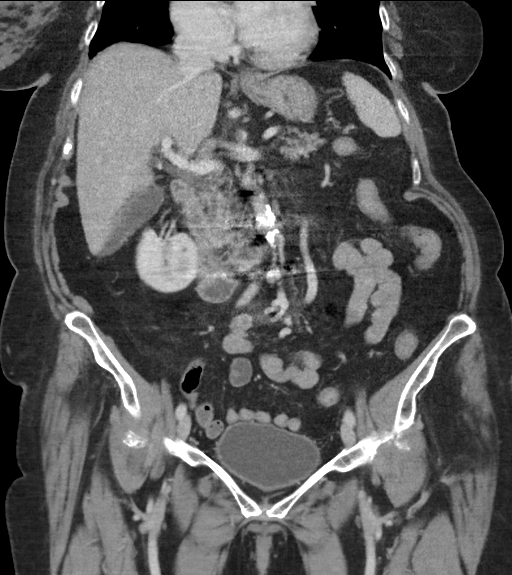
[im 56/100  soft-tissue]
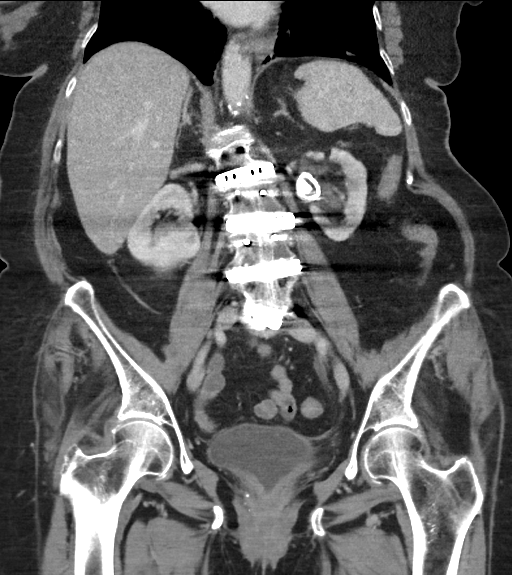

[16 of 46 positions shown; findings below may reference images not displayed]

RADIATION DOSE REDUCTION: This exam was performed according to the
departmental dose-optimization program which includes automated
exposure control, adjustment of the mA and/or kV according to
patient size and/or use of iterative reconstruction technique.

CONTRAST:  100mL OMNIPAQUE IOHEXOL 300 MG/ML  SOLN
FINDINGS: Lower chest: No acute pleural or parenchymal lung disease. Stable
bibasilar scarring.

Hepatobiliary: No focal liver abnormality is seen. No gallstones,
gallbladder wall thickening, or biliary dilatation.

Pancreas: Unremarkable. No pancreatic ductal dilatation or
surrounding inflammatory changes.

Spleen: Normal in size without focal abnormality.

Adrenals/Urinary Tract: There is a large lamellated nonobstructing
calculus within the left renal pelvis, measuring 2.3 by 1.5 x
cm. Multiple other nonobstructing left renal calculi are identified,
largest in the lower pole calyx measuring 8 mm. There are multiple
nonobstructing right renal calculi, largest measuring 5 mm within
the lower pole calyx. No obstructive uropathy within either kidney.
Mild mucosal enhancement of the left ureter could reflect underlying
infection.

Bladder is moderately distended without gross abnormality. The
adrenals are normal.

Stomach/Bowel: No bowel obstruction or ileus. No bowel wall
thickening or inflammatory change.

Vascular/Lymphatic: Aortic atherosclerosis. No enlarged abdominal or
pelvic lymph nodes.

Reproductive: Status post hysterectomy. No adnexal masses.

Other: No free fluid or free gas.  No abdominal wall hernia.

Musculoskeletal: No acute or destructive bony lesions. Extensive
postsurgical changes throughout the lumbar spine again noted.
Reconstructed images demonstrate no additional findings.
IMPRESSION: 1. Bilateral nonobstructing renal calculi, left greater than right.
2. Mild enhancement of the left ureteral mucosa, nonspecific. This
could reflect underlying infection. Please correlate with
urinalysis.
3.  Aortic Atherosclerosis ([id number]).

## 2023-08-25 ENCOUNTER — Emergency Department (HOSPITAL_COMMUNITY): Payer: PPO

## 2023-08-25 ENCOUNTER — Encounter (HOSPITAL_COMMUNITY): Payer: Self-pay | Admitting: Emergency Medicine

## 2023-08-25 ENCOUNTER — Emergency Department (HOSPITAL_COMMUNITY)
Admission: EM | Admit: 2023-08-25 | Discharge: 2023-08-25 | Disposition: A | Discharge status: Home / Self Care | Payer: PPO | Attending: Emergency Medicine | Admitting: Emergency Medicine

## 2023-08-25 DIAGNOSIS — Z79899 Other long term (current) drug therapy: Secondary | ICD-10-CM | POA: Diagnosis not present

## 2023-08-25 DIAGNOSIS — N200 Calculus of kidney: Secondary | ICD-10-CM

## 2023-08-25 DIAGNOSIS — Z90710 Acquired absence of both cervix and uterus: Secondary | ICD-10-CM | POA: Diagnosis not present

## 2023-08-25 DIAGNOSIS — Z6820 Body mass index (BMI) 20.0-20.9, adult: Secondary | ICD-10-CM | POA: Diagnosis not present

## 2023-08-25 DIAGNOSIS — M25551 Pain in right hip: Secondary | ICD-10-CM | POA: Diagnosis present

## 2023-08-25 DIAGNOSIS — N132 Hydronephrosis with renal and ureteral calculous obstruction: Secondary | ICD-10-CM | POA: Diagnosis not present

## 2023-08-25 DIAGNOSIS — I10 Essential (primary) hypertension: Secondary | ICD-10-CM | POA: Insufficient documentation

## 2023-08-25 DIAGNOSIS — R319 Hematuria, unspecified: Secondary | ICD-10-CM | POA: Diagnosis not present

## 2023-08-25 DIAGNOSIS — R509 Fever, unspecified: Secondary | ICD-10-CM | POA: Diagnosis not present

## 2023-08-25 DIAGNOSIS — I4891 Unspecified atrial fibrillation: Secondary | ICD-10-CM | POA: Diagnosis not present

## 2023-08-25 DIAGNOSIS — K449 Diaphragmatic hernia without obstruction or gangrene: Secondary | ICD-10-CM | POA: Diagnosis not present

## 2023-08-25 DIAGNOSIS — R109 Unspecified abdominal pain: Secondary | ICD-10-CM | POA: Diagnosis not present

## 2023-08-25 LAB — CBC WITH DIFFERENTIAL/PLATELET
Abs Immature Granulocytes: 0.01 10*3/uL (ref 0.00–0.07)
Basophils Absolute: 0.1 10*3/uL (ref 0.0–0.1)
Basophils Relative: 1 %
Eosinophils Absolute: 0.1 10*3/uL (ref 0.0–0.5)
Eosinophils Relative: 2 %
HCT: 36.7 % (ref 36.0–46.0)
Hemoglobin: 12.1 g/dL (ref 12.0–15.0)
Immature Granulocytes: 0 %
Lymphocytes Relative: 20 %
Lymphs Abs: 1.4 10*3/uL (ref 0.7–4.0)
MCH: 33.5 pg (ref 26.0–34.0)
MCHC: 33 g/dL (ref 30.0–36.0)
MCV: 101.7 fL — ABNORMAL HIGH (ref 80.0–100.0)
Monocytes Absolute: 0.4 10*3/uL (ref 0.1–1.0)
Monocytes Relative: 6 %
Neutro Abs: 5.1 10*3/uL (ref 1.7–7.7)
Neutrophils Relative %: 71 %
Platelets: 314 10*3/uL (ref 150–400)
RBC: 3.61 MIL/uL — ABNORMAL LOW (ref 3.87–5.11)
RDW: 11.9 % (ref 11.5–15.5)
WBC: 7.1 10*3/uL (ref 4.0–10.5)
nRBC: 0 % (ref 0.0–0.2)

## 2023-08-25 LAB — URINALYSIS, ROUTINE W REFLEX MICROSCOPIC
Bilirubin Urine: NEGATIVE
Glucose, UA: NEGATIVE mg/dL
Ketones, ur: NEGATIVE mg/dL
Nitrite: NEGATIVE
Protein, ur: 100 mg/dL — AB
RBC / HPF: >50 RBC/hpf (ref 0–5)
Specific Gravity, Urine: 1.008 (ref 1.005–1.030)
WBC, UA: >50 WBC/hpf (ref 0–5)
pH: 6 (ref 5.0–8.0)

## 2023-08-25 LAB — COMPREHENSIVE METABOLIC PANEL
ALT: 13 U/L (ref 0–44)
AST: 29 U/L (ref 15–41)
Albumin: 3.7 g/dL (ref 3.5–5.0)
Alkaline Phosphatase: 84 U/L (ref 38–126)
Anion gap: 10 (ref 5–15)
BUN: 9 mg/dL (ref 8–23)
CO2: 26 mmol/L (ref 22–32)
Calcium: 9.3 mg/dL (ref 8.9–10.3)
Chloride: 103 mmol/L (ref 98–111)
Creatinine, Ser: 0.61 mg/dL (ref 0.44–1.00)
GFR, Estimated: >60 mL/min (ref >60)
Glucose, Bld: 108 mg/dL — ABNORMAL HIGH (ref 70–99)
Potassium: 3.6 mmol/L (ref 3.5–5.1)
Sodium: 139 mmol/L (ref 135–145)
Total Bilirubin: 0.7 mg/dL (ref <1.2)
Total Protein: 8.5 g/dL — ABNORMAL HIGH (ref 6.5–8.1)

## 2023-08-25 MED ORDER — OXYCODONE-ACETAMINOPHEN 5-325 MG PO TABS: 1.0000 | ORAL_TABLET | Freq: Four times a day (QID) | ORAL | 0 refills | Status: AC | PRN

## 2023-08-25 MED ORDER — SODIUM CHLORIDE 0.9 % IV BOLUS: 1000.0000 mL | Freq: Once | INTRAVENOUS | Status: DC

## 2023-08-25 MED ORDER — ONDANSETRON 4 MG PO TBDP: 4.0000 mg | ORAL_TABLET | Freq: Three times a day (TID) | ORAL | 0 refills | Status: AC | PRN

## 2023-08-25 MED ORDER — SODIUM CHLORIDE 0.9 % IV BOLUS
500.0000 mL | Freq: Once | INTRAVENOUS | Status: AC
  Administered 2023-08-25: 500 mL via INTRAVENOUS

## 2023-08-25 MED ORDER — FENTANYL CITRATE PF 50 MCG/ML IJ SOSY
50.0000 ug | PREFILLED_SYRINGE | Freq: Once | INTRAMUSCULAR | Status: AC
  Administered 2023-08-25: 50 ug via INTRAVENOUS
  Filled 2023-08-25: qty 1

## 2023-08-25 MED ORDER — KETOROLAC TROMETHAMINE 30 MG/ML IJ SOLN
15.0000 mg | Freq: Once | INTRAMUSCULAR | Status: AC
  Administered 2023-08-25: 15 mg via INTRAVENOUS
  Filled 2023-08-25: qty 1

## 2023-08-25 MED ORDER — TAMSULOSIN HCL 0.4 MG PO CAPS: 0.4000 mg | ORAL_CAPSULE | Freq: Every day | ORAL | 0 refills | Status: AC

## 2023-08-25 MED ORDER — ONDANSETRON HCL 4 MG/2ML IJ SOLN
4.0000 mg | Freq: Once | INTRAMUSCULAR | Status: AC
  Administered 2023-08-25: 4 mg via INTRAVENOUS
  Filled 2023-08-25: qty 2

## 2023-08-25 NOTE — ED Triage Notes (Signed)
Blood in urine and lower back pain for the past couple of days that continues to get worse. Also reports having surgery to RT hip in October that is still causing her pain.

## 2023-08-25 NOTE — ED Provider Notes (Signed)
[place] EMERGENCY DEPARTMENT AT [facility] Provider Note   CSN: [id number] Arrival date & time: 08/25/23  1128     History  Chief Complaint  Patient presents with   Hematuria    Tanya Mueller is a 88 y.o. female.  Pt is a 88 yo female with pmhx significant for kidney stones, afib (on Eliquis), scoliosis, and htn.  Pt noticed some blood in her urine associated with some back pain a few days ago.  She went to an urgent care and she was sent here.  Pt has also been having pain in her right hip which she fractured and had repaired a month ago.       Home Medications Prior to Admission medications   Medication Sig Start Date End Date Taking? Authorizing Provider  ondansetron (ZOFRAN-ODT) 4 MG disintegrating tablet Take 1 tablet (4 mg total) by mouth every 8 (eight) hours as needed. 08/25/23  Yes Jacalyn Lefevre, MD  oxyCODONE-acetaminophen (PERCOCET/ROXICET) 5-325 MG tablet Take 1 tablet by mouth every 6 (six) hours as needed for severe pain (pain score 7-10). 08/25/23  Yes Jacalyn Lefevre, MD  tamsulosin (FLOMAX) 0.4 MG CAPS capsule Take 1 capsule (0.4 mg total) by mouth daily. 08/25/23  Yes Jacalyn Lefevre, MD  alendronate (FOSAMAX) 70 MG tablet Take by mouth. 07/29/23 07/28/24  [provider]  apixaban (ELIQUIS) 5 MG TABS tablet Take 1 tablet (5 mg total) by mouth 2 (two) times daily. 03/22/23   Mallipeddi, Vishnu P, MD  diltiazem (CARDIZEM) 60 MG tablet take 1 tablet (60 MILLIGRAM total) by mouth every 8 (eight) hours as needed (palpitations). 08/16/23   Mallipeddi, Vishnu P, MD  fluticasone (FLONASE) 50 MCG/ACT nasal spray Place 2 sprays into the nose daily as needed for allergies.    [provider]  furosemide (LASIX) 20 MG tablet Take 1 tablet (20 mg total) by mouth daily as needed for fluid or edema. 07/12/23   Vassie Loll, MD  losartan (COZAAR) 50 MG tablet Take 1 tablet (50 mg total) by mouth daily. 07/12/23 07/06/24  Vassie Loll, MD   MOTEGRITY 2 MG TABS Take 1 tablet by mouth once daily 07/27/23   Aida Raider, NP  oxyCODONE (ROXICODONE) 15 MG immediate release tablet Take 1 tablet (15 mg total) by mouth every 6 (six) hours as needed for severe pain. 07/12/23   Vassie Loll, MD  polyethylene glycol (MIRALAX / GLYCOLAX) 17 g packet Take 17 g by mouth daily as needed for moderate constipation.    [provider]  potassium chloride (KLOR-CON M) 10 MEQ tablet Take 1 tablet (10 mEq total) by mouth daily as needed (with Lasix). 07/12/23   Vassie Loll, MD  sertraline (ZOLOFT) 50 MG tablet Take 75 mg by mouth daily. 10/30/22   [provider]      Allergies    Morphine    Review of Systems   Review of Systems  Genitourinary:  Positive for hematuria.  Musculoskeletal:  Positive for back pain.       Right hip pain  All other systems reviewed and are negative.   Physical Exam Updated Vital Signs BP (!) 165/71 (BP Location: Right Arm)   Pulse 77   Temp 98.5 F (36.9 C) (Oral)   Resp 18   Ht 5\' 6"  (1.676 m)   Wt 58.5 kg   SpO2 95%   BMI 20.82 kg/m  Physical Exam Vitals and nursing note reviewed.  Constitutional:      Appearance: Normal appearance.  HENT:     Head: Normocephalic and atraumatic.     Right Ear: External ear normal.     Left Ear: External ear normal.     Nose: Nose normal.     Mouth/Throat:     Mouth: Mucous membranes are moist.     Pharynx: Oropharynx is clear.  Eyes:     Extraocular Movements: Extraocular movements intact.     Conjunctiva/sclera: Conjunctivae normal.     Pupils: Pupils are equal, round, and reactive to light.  Cardiovascular:     Rate and Rhythm: Normal rate and regular rhythm.     Pulses: Normal pulses.     Heart sounds: Normal heart sounds.  Pulmonary:     Effort: Pulmonary effort is normal.     Breath sounds: Normal breath sounds.  Abdominal:     General: Abdomen is flat. Bowel sounds are normal.     Palpations: Abdomen is soft.   Musculoskeletal:        General: Normal range of motion.     Cervical back: Normal range of motion and neck supple.  Skin:    General: Skin is warm.     Capillary Refill: Capillary refill takes less than 2 seconds.  Neurological:     General: No focal deficit present.     Mental Status: She is alert and oriented to person, place, and time.  Psychiatric:        Mood and Affect: Mood normal.        Behavior: Behavior normal.        Thought Content: Thought content normal.        Judgment: Judgment normal.     ED Results / Procedures / Treatments   Labs (all labs ordered are listed, but only abnormal results are displayed) Labs Reviewed  URINALYSIS, ROUTINE W REFLEX MICROSCOPIC - Abnormal; Notable for the following components:      Result Value   Color, Urine RED (*)    APPearance HAZY (*)    Hgb urine dipstick LARGE (*)    Protein, ur 100 (*)    Leukocytes,Ua TRACE (*)    Bacteria, UA FEW (*)    All other components within normal limits  CBC WITH DIFFERENTIAL/PLATELET - Abnormal; Notable for the following components:   RBC 3.61 (*)    MCV 101.7 (*)    All other components within normal limits  COMPREHENSIVE METABOLIC PANEL - Abnormal; Notable for the following components:   Glucose, Bld 108 (*)    Total Protein 8.5 (*)    All other components within normal limits  URINE CULTURE    EKG None  Radiology CT Renal Stone Study  Result Date: 08/25/2023 CLINICAL DATA:  Abdominal/flank pain, stone suspected. EXAM: CT ABDOMEN AND PELVIS WITHOUT CONTRAST TECHNIQUE: Multidetector CT imaging of the abdomen and pelvis was performed following the standard protocol without IV contrast. RADIATION DOSE REDUCTION: This exam was performed according to the departmental dose-optimization program which includes automated exposure control, adjustment of the mA and/or kV according to patient size and/or use of iterative reconstruction technique. COMPARISON:  CT scan abdomen and pelvis from  03/12/2023. FINDINGS: Lower chest: There are subpleural atelectatic changes in the visualized lung bases. No overt consolidation. No pleural effusion. The heart is normal in size. No pericardial effusion. Hepatobiliary: The liver is normal in size. Non-cirrhotic configuration. No suspicious mass. No intrahepatic or extrahepatic bile duct dilation. No calcified gallstones. Normal gallbladder wall thickness. No pericholecystic inflammatory changes. Pancreas: Unremarkable. No pancreatic ductal dilatation or  surrounding inflammatory changes. Spleen: Within normal limits. No focal lesion. Adrenals/Urinary Tract: Adrenal glands are unremarkable. No suspicious renal mass seen within the limitations of this unenhanced exam. There is a 4 x 7 mm right pelviureteric junction calculus causing mild to moderate proximal hydronephrosis. There are additional at least 4, sub 4 mm calculi in the right kidney. There are at least 5, sub 5 mm calculi in the left kidney. There is mild left hydronephrosis and entire hydroureter. However, no discrete obstructing mass or ureterolithiasis seen. Unremarkable urinary bladder. Stomach/Bowel: There is a small sliding hiatal hernia. No disproportionate dilation of the small or large bowel loops. No evidence of abnormal bowel wall thickening or inflammatory changes. The appendix was not visualized; however there is no acute inflammatory process in the right lower quadrant. Vascular/Lymphatic: No ascites or pneumoperitoneum. No abdominal or pelvic lymphadenopathy, by size criteria. No aneurysmal dilation of the major abdominal arteries. There are mild peripheral atherosclerotic vascular calcifications of the aorta and its major branches. Reproductive: The uterus is surgically absent. No large adnexal mass. Other: The visualized soft tissues and abdominal wall are unremarkable. Musculoskeletal: No suspicious osseous lesions. There are moderate multilevel degenerative changes in the visualized spine.  Posterior spinal fixation of L2 through S1 noted. There also 3 cortical screw decompressing the right femoral head. IMPRESSION: 1. There is a 4 x 7 mm right pelviureteric junction calculus causing mild-to-moderate proximal obstructive uropathy. 2. There are multiple additional bilateral nephrolithiasis, as described above. 3. There is mild left hydronephrosis and entire hydroureter without discrete obstructing mass or ureterolithiasis. Findings may be due to reflux or stricture at the vesicoureteric junction region. 4. Multiple other nonacute observations, as described above. Electronically Signed   By: Jules Schick M.D.   On: 08/25/2023 13:13    Procedures Procedures    Medications Ordered in ED Medications  ondansetron (ZOFRAN) injection 4 mg (4 mg Intravenous Given 08/25/23 1322)  fentaNYL (SUBLIMAZE) injection 50 mcg (50 mcg Intravenous Given 08/25/23 1322)  sodium chloride 0.9 % bolus 500 mL (0 mLs Intravenous Stopped 08/25/23 1545)  ketorolac (TORADOL) 30 MG/ML injection 15 mg (15 mg Intravenous Given 08/25/23 1322)  fentaNYL (SUBLIMAZE) injection 50 mcg (50 mcg Intravenous Given 08/25/23 1523)    ED Course/ Medical Decision Making/ A&P                                 Medical Decision Making Amount and/or Complexity of Data Reviewed Labs: ordered. Radiology: ordered.  Risk Prescription drug management.   This patient presents to the ED for concern of hematuria, this involves an extensive number of treatment options, and is a complaint that carries with it a high risk of complications and morbidity.  The differential diagnosis includes kidney stone, infection, anemia   Co morbidities that complicate the patient evaluation  kidney stones, afib (on Eliquis), scoliosis, and htn   Additional history obtained:  Additional history obtained from epic chart review External records from outside source obtained and reviewed including daughter   Lab Tests:  I Ordered, and  personally interpreted labs.  The pertinent results include:  ua + tr le, >50 rbcs, >50 wbcs but no bacteria   Imaging Studies ordered:  I ordered imaging studies including ct renal  I independently visualized and interpreted imaging which showed   There is a 4 x 7 mm right pelviureteric junction calculus causing  mild-to-moderate proximal obstructive uropathy.  2. There are multiple additional  bilateral nephrolithiasis, as  described above.  3. There is mild left hydronephrosis and entire hydroureter without  discrete obstructing mass or ureterolithiasis. Findings may be due  to reflux or stricture at the vesicoureteric junction region.  4. Multiple other nonacute observations, as described above.   I agree with the radiologist interpretation   Cardiac Monitoring:  The patient was maintained on a cardiac monitor.  I personally viewed and interpreted the cardiac monitored which showed an underlying rhythm of: nsr   Medicines ordered and prescription drug management:  I ordered medication including fentanyl/zofran/toradol  for sx  Reevaluation of the patient after these medicines showed that the patient improved I have reviewed the patients home medicines and have made adjustments as needed   Test Considered:  ct   Critical Interventions:  Pain control   Problem List / ED Course:  Renal colic:  Pt is feeling much better.  UA does show some wbcs, but no bacteria.  WBC is nl. She has remained afebrile,  so I don't think she has a UTI.  I did sent urine for cx.  Pt is stable for d/c.  She is to f/u with urology.  She knows to return if sx worsen.   Reevaluation:  After the interventions noted above, I reevaluated the patient and found that they have :improved   Social Determinants of Health:  Lives at home   Dispostion:  After consideration of the diagnostic results and the patients response to treatment, I feel that the patent would benefit from discharge with  outpatient f/u.          Final Clinical Impression(s) / ED Diagnoses Final diagnoses:  Kidney stone    Rx / DC Orders ED Discharge Orders          Ordered    oxyCODONE-acetaminophen (PERCOCET/ROXICET) 5-325 MG tablet  Every 6 hours PRN        08/25/23 1545    ondansetron (ZOFRAN-ODT) 4 MG disintegrating tablet  Every 8 hours PRN        08/25/23 1545    tamsulosin (FLOMAX) 0.4 MG CAPS capsule  Daily        08/25/23 1545              Jacalyn Lefevre, MD 08/25/23 1559

## 2023-08-25 NOTE — ED Notes (Signed)
Lab added on Urine Culture

## 2023-08-25 NOTE — ED Notes (Signed)
Pt assisted to the bathroom in wheelchair.

## 2023-08-25 NOTE — ED Notes (Signed)
Pt assisted to bathroom by wheelchair.

## 2023-08-27 DIAGNOSIS — K5900 Constipation, unspecified: Secondary | ICD-10-CM | POA: Diagnosis not present

## 2023-08-27 DIAGNOSIS — M545 Low back pain, unspecified: Secondary | ICD-10-CM | POA: Diagnosis not present

## 2023-08-27 DIAGNOSIS — I482 Chronic atrial fibrillation, unspecified: Secondary | ICD-10-CM | POA: Diagnosis not present

## 2023-08-27 DIAGNOSIS — S72001A Fracture of unspecified part of neck of right femur, initial encounter for closed fracture: Secondary | ICD-10-CM | POA: Diagnosis not present

## 2023-08-27 LAB — URINE CULTURE: Culture: >=100000 — AB

## 2023-08-28 ENCOUNTER — Telehealth (HOSPITAL_BASED_OUTPATIENT_CLINIC_OR_DEPARTMENT_OTHER): Payer: Self-pay | Admitting: *Deleted

## 2023-08-28 NOTE — Progress Notes (Signed)
ED Antimicrobial Stewardship Positive Culture Follow Up   Tanya Mueller is an 88 y.o. female who presented to [facility] on 08/25/2023 with a chief complaint of  Chief Complaint  Patient presents with   Hematuria    Recent Results (from the past 720 hour(s))  Urine Culture     Status: Abnormal   Collection Time: 08/25/23 11:41 AM   Specimen: Urine, Clean Catch  Result Value Ref Range Status   Specimen Description   Final    URINE, CLEAN CATCH Performed at [facility], [address], [place], [place] [address]    Special Requests   Final    NONE Performed at [facility], [address] [place], [place] [address]    Culture (A)  Final    >=100,000 COLONIES/mL STREPTOCOCCUS AGALACTIAE TESTING AGAINST S. AGALACTIAE NOT ROUTINELY PERFORMED DUE TO PREDICTABILITY OF AMP/PEN/VAN SUSCEPTIBILITY. Performed at [facility], [address], [place], [place] [address]    Report Status 08/27/2023 FINAL  Final    [x]  Patient discharged originally without antimicrobial agent and treatment may be indicated  Symptom check - if patient has symptoms -   New antibiotic prescription: Keflex 500mg  BID x 7 days.   ED Provider: Jacalyn Lefevre, MD  Estill Batten, PharmD, BCCCP  08/28/2023, 9:42 AM Clinical Pharmacist Monday - Friday phone -  [phone] Saturday - Sunday phone - [phone]

## 2023-08-28 NOTE — Telephone Encounter (Signed)
Post ED Visit - Positive Culture Follow-up: Successful Patient Follow-Up  Culture assessed and recommendations reviewed by:  []  Enzo Bi, Pharm.D. []  Celedonio Miyamoto, Pharm.D., BCPS AQ-ID []  Garvin Fila, Pharm.D., BCPS []  Georgina Pillion, Pharm.D., BCPS []  [place], [address].D., BCPS, AAHIVP []  Estella Husk, Pharm.D., BCPS, AAHIVP []  Lysle Pearl, PharmD, BCPS []  Phillips Climes, PharmD, BCPS []  Agapito Games, PharmD, BCPS []  Verlan Friends, PharmD  Positive urine culture  [x]  Patient discharged without antimicrobial prescription and treatment is now indicated []  Organism is resistant to prescribed ED discharge antimicrobial []  Patient with positive blood cultures  Changes discussed with ED provider: Jacalyn Lefevre, MD New antibiotic prescription Keflex 500mg  PO BID x 7 days Called to [facility] for (+) UTI symptoms  Contacted Daughter of patient, date 08/28/2023, time 1000   Lysle Pearl 08/28/2023, 10:03 AM

## 2023-09-04 ENCOUNTER — Telehealth: Payer: Self-pay | Admitting: Internal Medicine

## 2023-09-04 ENCOUNTER — Ambulatory Visit: Payer: PPO | Attending: Internal Medicine | Admitting: Internal Medicine

## 2023-09-04 ENCOUNTER — Encounter: Payer: Self-pay | Admitting: Internal Medicine

## 2023-09-04 VITALS — BP 140/70 | HR 52 | Ht 66.0 in | Wt 134.8 lb

## 2023-09-04 DIAGNOSIS — I4892 Unspecified atrial flutter: Secondary | ICD-10-CM | POA: Diagnosis not present

## 2023-09-04 MED ORDER — DILTIAZEM HCL ER COATED BEADS 120 MG PO CP24: 120.0000 mg | ORAL_CAPSULE | Freq: Every day | ORAL | 2 refills | Status: DC

## 2023-09-04 MED ORDER — DILTIAZEM HCL 120 MG PO TABS: 120.0000 mg | ORAL_TABLET | Freq: Every day | ORAL | 1 refills | Status: DC

## 2023-09-04 NOTE — Telephone Encounter (Signed)
Updated prescription sent

## 2023-09-04 NOTE — Telephone Encounter (Signed)
Pt c/o medication issue:  1. Name of Medication: diltiazem (CARDIZEM) 120 MG tablet   2. How are you currently taking this medication (dosage and times per day)? Take 1 tablet (120 mg total) by mouth daily.   3. Are you having a reaction (difficulty breathing--STAT)? No   4. What is your medication issue? Patient's daughter said that she thought Dr. Jenene Slicker was sending in medication for extended release capsules. Please call to verify

## 2023-09-04 NOTE — Progress Notes (Signed)
Cardiology Office Note  Date: 09/04/2023   ID: Tanya Mueller, DOB [birth year], MRN [id number]  PCP:  Estanislado Pandy, MD  Cardiologist:  Marjo Bicker, MD Electrophysiologist:  None   History of Present Illness: Tanya Mueller is a 88 y.o. female known to have paroxysmal A-fib and paroxysmal atrial flutter, HTN is here for follow-up visit.  Patient was previously followed by Dr. Purvis Sheffield for palpitations for which she had a event monitor in 2018 that showed symptomatic PACs and echo showed normal LVEF and no valve abnormalities. She was eventually diagnosed with atrial fibrillation on EKG and Apple Watch EKG strips in 09/2022 after which patient was started on metoprolol tartrate 50 mg twice daily. She underwent a cystoscopy in the second week of December 2023 with no complications but upon discharge from the hospital, she came right back to ER with A-fib with RVR, on diltiazem drip, stabilized and increased dose of metoprolol tartrate from 50 mg to 75 mg twice daily upon discharge. Coumadin was also switched to Eliquis. In 10/2022, she continues to have palpitations with heart rate ranging between 70 and 100s. Amiodarone was started with controlled heart rates but she developed new symptoms of DOE.  EKG from 2/24 showed new onset atrial flutter.  She was eventually admitted to the [facility] in 12/2022 for acute on chronic diastolic heart failure exacerbation stabilized on IV Lasix.  She spontaneously converted to normal sinus rhythm in the hospital.  Upon discharge, she continued to be in normal sinus rhythm.  She is here for follow-up visit.  She has been taking diltiazem short acting 60 mg 1 to 2 pills almost daily due to palpitations.  EKG today showed normal sinus rhythm and frequent PACs.  Interval history of ER visits and hospitalizations for hip fracture and kidney stones.  Otherwise doing great, no symptoms of DOE, leg swelling, dizziness, syncope.  Past Medical History:   Diagnosis Date   Allergic rhinitis    Arthritis    Back ,hips   Cancer (HCC)    Complication of anesthesia    awareness during general anesthesia.   Dysrhythmia    A-Fib and PSVT   Fatigue    History of atrial fibrillation    episode 2013 converted with cardizem-- happened at time acute illness due to kidney stone --  resolved  no issues since   History of kidney stones    History of sepsis    urosepsis  2013 due to kidney stone   Hypertension    Left nephrolithiasis    Left ureteral calculus    Low back pain    Neuromuscular disorder (HCC)    PAC (premature atrial contraction)    Scoliosis    Solitary lung nodule    benign right middle lobe per CT   Tremor     Past Surgical History:  Procedure Laterality Date   BACK SURGERY  2015   rod and screws   CATARACT EXTRACTION W/ INTRAOCULAR LENS  IMPLANT, BILATERAL  10/02/2008   CYSTOSCOPY WITH BIOPSY N/A 04/08/2019   Procedure: CYSTOSCOPY WITH bladder BIOPSY, FULGURATION;  Surgeon: Jerilee Field, MD;  Location: [facility];  Service: Urology;  Laterality: N/A;   CYSTOSCOPY WITH STENT PLACEMENT Left 09/15/2014   Procedure: CYSTOSCOPY WITH STENT PLACEMENT;  Surgeon: Doyne Keel, MD;  Location: [facility];  Service: Urology;  Laterality: Left;   CYSTOSCOPY/RETROGRADE/URETEROSCOPY Left 08/09/2014   Procedure: CYSTOSCOPY/RETROGRADE/LEFT STENT;  Surgeon: Valetta Fuller, MD;  Location: [facility];  Service: Urology;  Laterality: Left;   CYSTOSCOPY/URETEROSCOPY/HOLMIUM LASER/STENT PLACEMENT Left 09/15/2022   Procedure: CYSTOSCOPY LEFT URETEROSCOPY/STONE BASKET EXTRACTION;  Surgeon: Bjorn Pippin, MD;  Location: [facility];  Service: Urology;  Laterality: Left;  1 HR FOR CASE   EXTRACORPOREAL SHOCK WAVE LITHOTRIPSY  10/03/2011   HIP PINNING,CANNULATED Right 07/10/2023   Procedure: PERCUTANEOUS FIXATION OF FEMORAL NECK;  Surgeon: Vickki Hearing, MD;  Location: [facility];  Service: Orthopedics;  Laterality: Right;  c arm    HOLMIUM LASER APPLICATION Left 09/15/2014   Procedure: HOLMIUM LASER APPLICATION;  Surgeon: Doyne Keel, MD;  Location: [facility];  Service: Urology;  Laterality: Left;   IR URETERAL STENT LEFT NEW ACCESS W/O SEP NEPHROSTOMY CATH  08/18/2022   LAPAROTOMY W/  UNILATERAL SALPINGOOPHORECTOMY  age 54   NEPHROLITHOTOMY Left 08/18/2022   Procedure: LEFT NEPHROLITHOTOMY PERCUTANEOUS;  Surgeon: Bjorn Pippin, MD;  Location: [facility];  Service: Urology;  Laterality: Left;  2.5 HRS FOR CASE   TOTAL ABDOMINAL HYSTERECTOMY  age 39   w/ unilateral salpingoophorectomy   TRANSTHORACIC ECHOCARDIOGRAM  02/13/2012   mild LVH/  ef 65%/  trivial MR  &  PR/  mild RVE   URETEROSCOPY Left 09/15/2014   Procedure: URETEROSCOPY;  Surgeon: Doyne Keel, MD;  Location: [facility];  Service: Urology;  Laterality: Left;    Current Outpatient Medications  Medication Sig Dispense Refill   alendronate (FOSAMAX) 70 MG tablet Take by mouth.     apixaban (ELIQUIS) 5 MG TABS tablet Take 1 tablet (5 mg total) by mouth 2 (two) times daily.     diltiazem (CARDIZEM) 60 MG tablet take 1 tablet (60 MILLIGRAM total) by mouth every 8 (eight) hours as needed (palpitations). 90 tablet 0   fluticasone (FLONASE) 50 MCG/ACT nasal spray Place 2 sprays into the nose daily as needed for allergies.     furosemide (LASIX) 20 MG tablet Take 1 tablet (20 mg total) by mouth daily as needed for fluid or edema.     losartan (COZAAR) 50 MG tablet Take 1 tablet (50 mg total) by mouth daily. 90 tablet 3   MOTEGRITY 2 MG TABS Take 1 tablet by mouth once daily 30 tablet 0   ondansetron (ZOFRAN-ODT) 4 MG disintegrating tablet Take 1 tablet (4 mg total) by mouth every 8 (eight) hours as needed. 20 tablet 0   oxyCODONE (ROXICODONE) 15 MG immediate release tablet Take 1 tablet (15 mg total) by mouth every 6 (six) hours as needed for severe pain. 30 tablet 0   oxyCODONE-acetaminophen (PERCOCET/ROXICET) 5-325 MG tablet  Take 1 tablet by mouth every 6 (six) hours as needed for severe pain (pain score 7-10). 15 tablet 0   polyethylene glycol (MIRALAX / GLYCOLAX) 17 g packet Take 17 g by mouth daily as needed for moderate constipation.     potassium chloride (KLOR-CON M) 10 MEQ tablet Take 1 tablet (10 mEq total) by mouth daily as needed (with Lasix).     sertraline (ZOLOFT) 50 MG tablet Take 75 mg by mouth daily.     tamsulosin (FLOMAX) 0.4 MG CAPS capsule Take 1 capsule (0.4 mg total) by mouth daily. 30 capsule 0   No current facility-administered medications for this visit.   Allergies:  Morphine   Social History: The patient  reports that she has never smoked. She has never been exposed to tobacco smoke. She has never used smokeless tobacco. She reports that she does not drink alcohol and does not use drugs.  Family History: The patient's family history includes Asthma in her sister; Bladder Cancer in her brother; Bone cancer in her brother; Cancer in some other family members; Cerebral aneurysm in her father; Hypertension in her brother and mother.   ROS:  Please see the history of present illness. Otherwise, complete review of systems is positive for none.  All other systems are reviewed and negative.   Physical Exam: VS:  BP (!) 140/70   Pulse (!) 52   Ht 5\' 6"  (1.676 m)   Wt 134 lb 12.8 oz (61.1 kg)   SpO2 98%   BMI 21.76 kg/m , BMI Body mass index is 21.76 kg/m.  Wt Readings from Last 3 Encounters:  09/04/23 134 lb 12.8 oz (61.1 kg)  08/25/23 129 lb (58.5 kg)  07/10/23 131 lb 9.8 oz (59.7 kg)    General: Patient appears comfortable at rest. HEENT: Conjunctiva and lids normal, oropharynx clear with moist mucosa. Neck: Supple, no elevated JVP or carotid bruits, no thyromegaly. Lungs: Clear to auscultation, nonlabored breathing at rest. Cardiac: Irregular rate and rhythm, no S3 or significant systolic murmur, no pericardial rub. Abdomen: Soft, nontender, no hepatomegaly, bowel sounds present,  no guarding or rebound. Extremities: No pitting edema, distal pulses 2+. Skin: Warm and dry. Musculoskeletal: No kyphosis. Neuropsychiatric: Alert and oriented x3, affect grossly appropriate.  ECG today:  Atrial Fibrillation  Recent Labwork: 09/15/2022: TSH 1.165 12/10/2022: B Natriuretic Peptide 427.0 12/12/2022: Magnesium 1.8 08/25/2023: ALT 13; AST 29; BUN 9; Creatinine, Ser 0.61; Hemoglobin 12.1; Platelets 314; Potassium 3.6; Sodium 139  No results found for: "CHOL", "TRIG", "HDL", "CHOLHDL", "VLDL", "LDLCALC", "LDLDIRECT"  Other Studies Reviewed Today: I personally reviewed the Holter monitor and echo report from 2018 and 2023 Event monitor from 2018 showed PACs but no atrial fibrillation Echo from 2018 and 2023 showed normal LVEF and no valve abnormalities  Assessment and Plan:   # Paroxysmal A-fib # Paroxysmal atrial flutter -Patient was diagnosed with A-fib in 09/2022 on her Apple Watch.  She was started on amiodarone with subsequent EKG in 11/2022 showing atrial flutter.  She was admitted to [facility] during 12/2022 hospitalization for acute on chronic HFpEF where she spontaneously converted to NSR.  Since discharge, she continued to be in NSR.  Amiodarone was discontinued due to jitteriness.  Metoprolol was discontinued upon discharge due to bradycardia.  Currently on diltiazem 60 mg every 8 hours as needed for palpitations, currently taking 1 to 2 pills daily.  Will switch short acting to long-acting diltiazem 120 mg once daily.  EKG today showed normal sinus rhythm and frequent PACs, HR 80 bpm.  Continue Eliquis 5 mg twice daily.  # Chronic diastolic heart failure -Compensated, continue p.o. Lasix 20 mg daily as needed.  # Moderate RV enlargement in 12/23 echo, resolved -Limited echocardiogram showed normal RV and LV size/function.  # HTN, controlled -Continue losartan 50 mg once daily.   Medication Adjustments/Labs and Tests Ordered: Current medicines are reviewed at length  with the patient today.  Concerns regarding medicines are outlined above.      Disposition:  Follow up  1 year  Signed, Hareem Surowiec Verne Spurr, MD, 09/04/2023 1:34 PM    [facility] at [facility] [address], [place], [place] [address]

## 2023-09-04 NOTE — Patient Instructions (Addendum)
Medication Instructions:  Your physician has recommended you make the following change in your medication:  Stop taking Diltiazem 60 mg and start taking Diltiazem 120 mg daily Continue taking all other medications as prescribed   Labwork: None  Testing/Procedures:   Follow-Up: Your physician recommends that you schedule a follow-up appointment in: 1 year. You will receive a reminder call in about 8 months reminding you to schedule your appointment. If you don't receive this call, please contact our office.   Any Other Special Instructions Will Be Listed Below (If Applicable).  Thank you for choosing [facility]!     If you need a refill on your cardiac medications before your next appointment, please call your pharmacy.

## 2023-09-07 ENCOUNTER — Other Ambulatory Visit (INDEPENDENT_AMBULATORY_CARE_PROVIDER_SITE_OTHER): Payer: PPO

## 2023-09-07 ENCOUNTER — Encounter: Payer: Self-pay | Admitting: Orthopedic Surgery

## 2023-09-07 ENCOUNTER — Ambulatory Visit (INDEPENDENT_AMBULATORY_CARE_PROVIDER_SITE_OTHER): Payer: PPO | Admitting: Orthopedic Surgery

## 2023-09-07 VITALS — Ht 64.0 in | Wt 137.0 lb

## 2023-09-07 DIAGNOSIS — S72001A Fracture of unspecified part of neck of right femur, initial encounter for closed fracture: Secondary | ICD-10-CM

## 2023-09-07 DIAGNOSIS — S72001D Fracture of unspecified part of neck of right femur, subsequent encounter for closed fracture with routine healing: Secondary | ICD-10-CM

## 2023-09-07 NOTE — Progress Notes (Signed)
Office Visit Note   Patient: Tanya Mueller           Date of Birth: 1934-10-30           MRN: [id number] Visit Date: 09/07/2023 Requested by: Sasser, Clarene Critchley, MD [address] [place],  [place] [address] PCP: Estanislado Pandy, MD   Chief Complaint  Patient presents with   Follow-up    F/U OTIF RT HIP , HIP FEM NCK FX 07/10/23   Allergies  Allergen Reactions   Morphine Other (See Comments)    Nervous   Encounter Diagnoses  Name Primary?   Closed fracture of right hip with routine healing, subsequent encounter: otif  (07/08/23)    Closed displaced fracture of right femoral neck (HCC) Yes   Body mass index is 23.52 kg/m.  PROCEDURE: Open treatment internal fixation right hip with cannulated screws  IMAGING: DG HIP UNILAT WITH PELVIS 2-3 VIEWS RIGHT  Result Date: 09/07/2023 Imaging right hip that is post OTIF with cannulated screws 3 cannulated screws are seen in the right hip to fully threaded 1 partially-threaded with washers The area is a fracture of the femoral neck which looks stable and the hardware looks to be intact There is some comminution noted on the superolateral portion of the femoral neck.  This is followed closely with serial imaging has not changed Healing fracture right femoral neck with no apparent complications     ASSESSMENT AND PLAN:  Tanya Mueller continues to improve.  She says that some of her back pain has improved since she had her surgery  Her x-ray shows 3 screws in good position she has some superolateral comminution of the neck.  Fracture appears to be stabilized with the cannulated screws  Excellent flexion of the hip without any pain  She does get an occasional lateral pain and an occasional medial pain in the abductor area these are minor  Recommend continue protected weightbearing and x-ray again in 4 weeks  Patient can be weightbearing as tolerated

## 2023-09-10 ENCOUNTER — Other Ambulatory Visit: Payer: Self-pay | Admitting: Gastroenterology

## 2023-09-10 DIAGNOSIS — K581 Irritable bowel syndrome with constipation: Secondary | ICD-10-CM

## 2023-09-10 DIAGNOSIS — K5903 Drug induced constipation: Secondary | ICD-10-CM

## 2023-09-13 ENCOUNTER — Ambulatory Visit (HOSPITAL_COMMUNITY)
Admission: RE | Admit: 2023-09-13 | Discharge: 2023-09-13 | Disposition: A | Discharge status: Home / Self Care | Payer: PPO | Source: Ambulatory Visit | Attending: Urology | Admitting: Urology

## 2023-09-13 ENCOUNTER — Ambulatory Visit: Payer: PPO | Admitting: Urology

## 2023-09-13 VITALS — BP 188/74 | HR 86

## 2023-09-13 DIAGNOSIS — N3020 Other chronic cystitis without hematuria: Secondary | ICD-10-CM | POA: Diagnosis not present

## 2023-09-13 DIAGNOSIS — N132 Hydronephrosis with renal and ureteral calculous obstruction: Secondary | ICD-10-CM | POA: Diagnosis not present

## 2023-09-13 DIAGNOSIS — N201 Calculus of ureter: Secondary | ICD-10-CM

## 2023-09-13 DIAGNOSIS — N2889 Other specified disorders of kidney and ureter: Secondary | ICD-10-CM | POA: Diagnosis not present

## 2023-09-13 DIAGNOSIS — R310 Gross hematuria: Secondary | ICD-10-CM

## 2023-09-13 DIAGNOSIS — N200 Calculus of kidney: Secondary | ICD-10-CM | POA: Diagnosis not present

## 2023-09-13 DIAGNOSIS — N202 Calculus of kidney with calculus of ureter: Secondary | ICD-10-CM | POA: Diagnosis not present

## 2023-09-13 LAB — URINALYSIS, ROUTINE W REFLEX MICROSCOPIC
Bilirubin, UA: NEGATIVE
Glucose, UA: NEGATIVE
Ketones, UA: NEGATIVE
Nitrite, UA: NEGATIVE
Protein,UA: NEGATIVE
Specific Gravity, UA: 1.015 (ref 1.005–1.030)
Urobilinogen, Ur: 0.2 mg/dL (ref 0.2–1.0)
pH, UA: 7 (ref 5.0–7.5)

## 2023-09-13 LAB — MICROSCOPIC EXAMINATION
Bacteria, UA: NONE SEEN
RBC, Urine: >30 /[HPF] — AB (ref 0–2)

## 2023-09-13 NOTE — Progress Notes (Signed)
Subjective: 1. Renal stones   2. Ureteral stone   3. Hydronephrosis with urinary obstruction due to ureteral calculus   4. Chronic cystitis    09/13/23: Tanya Mueller returns today in f/u.  She was in the ER on 08/25/23 with right flank pain,malaise and hematuria and was found on CT to have a 4x55mm right UPJ stone with small bilateral renal stones.  She is on Eliquis.  She has occasional right flank pain.  She has no nausea. She has no dysuria but has some frequency and urgency.  She is on tamsulosin. Since her last visit she has had a right femur fracture that was nailed on 07/10/23 .  Her UA had >50WBC and RBC with few bacteria and the urine culture in the ER grew strep.  She was given a PO antibiotic when the culture returned.  She had no fever.  CBC and CMP were unremarkable.   UA today has >30 RBC's.   03/01/23: Tanya Mueller returns today in f/u for her history below. Her Cr was 1.02 in 3/24.  She had a CT on 12/26/22 for LLQ pain and constipation and was found to have small bilateral renal stones without obstruction. The bladder looked fine.  She has had some increased pain in the left flank and into the hip and across the back and into the leg.  She has nausea without vomiting.  She had hematuria a couple of weeks ago and that gradually clears.  Her UA today has >30 RBC's.   She has some frequency and urgency.  She has not strain to void at times.  She remains on Eliquis.   2/29/24[name] returns today in f/u.  She had a left PCNL and left URS late last year.  She had hemorrhagic cystitis on 2/5 and took cipro and then amoxicillin once the culture was back.   She had a renal US on 11/21/22 and that showed no hydro or stones.  Her PVR is 0ml today and her UA just has 3-10 RBC's.  She has persistent left flank pain.   She has some vaginal irritation.  She has some malaise as well.  She is on chronic oxycodone q6hrs for chronic back pain.    09/21/22: Tanya Mueller returns today in f/u.  She had a left PCNL and subsequent left  URS and the stent has been removed.  She has some mild back pain.  She has no dysuria.  She had some increased nocturia last night.  Her UA has 11-30 RBC's. She had to come back to the hospital with a-fib on the evening of her last procedure on 09/15/22 and her HR remains variable.    Gu hx; Tanya Mueller is an 88 yo female who is a former patient of Dr. Mena Goes in [place].  She had follicular cystitis on a biopsy in 7/20 and has a history of stones with multiple prior procedures.  She has a history of OAB and has failed vesicare and myrbetriq.   She last had a CT in in 2/23 and had a 2.3cm laminated stone in the left renal pelvis and smaller right renal stones.  There was a 12mm stone in the LLP on a CT in 2020.   She has a chronic UTI that are becoming more frequency and she will generally benefit from a course of antibiotics.  The last oral antibiotic didn't help and needed a injection. She was in the ER on 05/05/22 with constipation.  A culture that day was negative.  She currently complains  of malodorous urine over the last 2 weeks with malaise.  She has no dysuria.   She has some frequency and urgency.  She has not had recent hematuria.   She has had multilevel back surgery with fusion and her back pain gets worse with the infections, left > right.  Her UA looks infected.    ROS:  Review of Systems  Gastrointestinal:  Positive for constipation.  Musculoskeletal:  Positive for back pain and joint pain.  All other systems reviewed and are negative.   Allergies  Allergen Reactions   Morphine Other (See Comments)    Nervous    Past Medical History:  Diagnosis Date   Allergic rhinitis    Arthritis    Back ,hips   Cancer (HCC)    Complication of anesthesia    awareness during general anesthesia.   Dysrhythmia    A-Fib and PSVT   Fatigue    History of atrial fibrillation    episode 2013 converted with cardizem-- happened at time acute illness due to kidney stone --  resolved  no issues since    History of kidney stones    History of sepsis    urosepsis  2013 due to kidney stone   Hypertension    Left nephrolithiasis    Left ureteral calculus    Low back pain    Neuromuscular disorder (HCC)    PAC (premature atrial contraction)    Scoliosis    Solitary lung nodule    benign right middle lobe per CT   Tremor     Past Surgical History:  Procedure Laterality Date   BACK SURGERY  2015   rod and screws   CATARACT EXTRACTION W/ INTRAOCULAR LENS  IMPLANT, BILATERAL  10/02/2008   CYSTOSCOPY WITH BIOPSY N/A 04/08/2019   Procedure: CYSTOSCOPY WITH bladder BIOPSY, FULGURATION;  Surgeon: Jerilee Field, MD;  Location: [facility];  Service: Urology;  Laterality: N/A;   CYSTOSCOPY WITH STENT PLACEMENT Left 09/15/2014   Procedure: CYSTOSCOPY WITH STENT PLACEMENT;  Surgeon: Doyne Keel, MD;  Location: [facility];  Service: Urology;  Laterality: Left;   CYSTOSCOPY/RETROGRADE/URETEROSCOPY Left 08/09/2014   Procedure: CYSTOSCOPY/RETROGRADE/LEFT STENT;  Surgeon: Valetta Fuller, MD;  Location: [facility];  Service: Urology;  Laterality: Left;   CYSTOSCOPY/URETEROSCOPY/HOLMIUM LASER/STENT PLACEMENT Left 09/15/2022   Procedure: CYSTOSCOPY LEFT URETEROSCOPY/STONE BASKET EXTRACTION;  Surgeon: Bjorn Pippin, MD;  Location: [facility];  Service: Urology;  Laterality: Left;  1 HR FOR CASE   EXTRACORPOREAL SHOCK WAVE LITHOTRIPSY  10/03/2011   HIP PINNING,CANNULATED Right 07/10/2023   Procedure: PERCUTANEOUS FIXATION OF FEMORAL NECK;  Surgeon: Vickki Hearing, MD;  Location: [facility];  Service: Orthopedics;  Laterality: Right;  c arm   HOLMIUM LASER APPLICATION Left 09/15/2014   Procedure: HOLMIUM LASER APPLICATION;  Surgeon: Doyne Keel, MD;  Location: [facility];  Service: Urology;  Laterality: Left;   IR URETERAL STENT LEFT NEW ACCESS W/O SEP NEPHROSTOMY CATH  08/18/2022   LAPAROTOMY W/  UNILATERAL SALPINGOOPHORECTOMY  age 59   NEPHROLITHOTOMY Left 08/18/2022    Procedure: LEFT NEPHROLITHOTOMY PERCUTANEOUS;  Surgeon: Bjorn Pippin, MD;  Location: [facility];  Service: Urology;  Laterality: Left;  2.5 HRS FOR CASE   TOTAL ABDOMINAL HYSTERECTOMY  age 89   w/ unilateral salpingoophorectomy   TRANSTHORACIC ECHOCARDIOGRAM  02/13/2012   mild LVH/  ef 65%/  trivial MR  &  PR/  mild RVE   URETEROSCOPY Left 09/15/2014   Procedure: URETEROSCOPY;  Surgeon: Lesia Sago  Mena Goes, MD;  Location[facility];  Service: Urology;  Laterality: Left;    Social History   Socioeconomic History   Marital status: Married    Spouse name: Not on file   Number of children: 1   Years of education: Not on file   Highest education level: Not on file  Occupational History    Employer: RETIRED  Tobacco Use   Smoking status: Never    Passive exposure: Never   Smokeless tobacco: Never  Vaping Use   Vaping status: Never Used  Substance and Sexual Activity   Alcohol use: No   Drug use: No   Sexual activity: Not Currently  Other Topics Concern   Not on file  Social History Narrative   Not on file   Social Drivers of Health   Financial Resource Strain: Low Risk  (12/14/2022)   Overall Financial Resource Strain (CARDIA)    Difficulty of Paying Living Expenses: Not hard at all  Food Insecurity: No Food Insecurity (07/09/2023)   Hunger Vital Sign    Worried About Running Out of Food in the Last Year: Never true    Ran Out of Food in the Last Year: Never true  Transportation Needs: No Transportation Needs (07/19/2023)   Received from [facility]   PRAPARE - Transportation    Lack of Transportation (Medical): No    Lack of Transportation (Non-Medical): No  Physical Activity: Inactive (12/14/2022)   Exercise Vital Sign    Days of Exercise per Week: 0 days    Minutes of Exercise per Session: 0 min  Stress: No Stress Concern Present (12/14/2022)   [employer] of Occupational Health - Occupational Stress Questionnaire    Feeling of Stress : Not at  all  Social Connections: Socially Integrated (12/14/2022)   Social Connection and Isolation Panel [NHANES]    Frequency of Communication with Friends and Family: More than three times a week    Frequency of Social Gatherings with Friends and Family: More than three times a week    Attends Religious Services: 1 to 4 times per year    Active Member of [employer] or Organizations: Yes    Attends Banker Meetings: 1 to 4 times per year    Marital Status: Married  Catering manager Violence: Not At Risk (07/09/2023)   Humiliation, Afraid, Rape, and Kick questionnaire    Fear of Current or Ex-Partner: No    Emotionally Abused: No    Physically Abused: No    Sexually Abused: No    Family History  Problem Relation Age of Onset   Hypertension Mother    Cerebral aneurysm Father    Asthma Sister    Bladder Cancer Brother    Hypertension Brother    Bone cancer Brother    Cancer Other        Lung and bladder   Cancer Other        "Bone marrow cancer"    Anti-infectives: Anti-infectives (From admission, onward)    None       Current Outpatient Medications  Medication Sig Dispense Refill   alendronate (FOSAMAX) 70 MG tablet Take by mouth.     apixaban (ELIQUIS) 5 MG TABS tablet Take 1 tablet (5 mg total) by mouth 2 (two) times daily.     diltiazem (CARDIZEM CD) 120 MG 24 hr capsule Take 1 capsule (120 mg total) by mouth daily. 90 capsule 2   fluticasone (FLONASE) 50 MCG/ACT nasal spray Place 2 sprays into the  nose daily as needed for allergies.     furosemide (LASIX) 20 MG tablet Take 1 tablet (20 mg total) by mouth daily as needed for fluid or edema.     losartan (COZAAR) 50 MG tablet Take 1 tablet (50 mg total) by mouth daily. 90 tablet 3   MOTEGRITY 2 MG TABS Take 1 tablet by mouth once daily 30 tablet 0   ondansetron (ZOFRAN-ODT) 4 MG disintegrating tablet Take 1 tablet (4 mg total) by mouth every 8 (eight) hours as needed. 20 tablet 0   oxyCODONE (ROXICODONE) 15 MG  immediate release tablet Take 1 tablet (15 mg total) by mouth every 6 (six) hours as needed for severe pain. 30 tablet 0   oxyCODONE-acetaminophen (PERCOCET/ROXICET) 5-325 MG tablet Take 1 tablet by mouth every 6 (six) hours as needed for severe pain (pain score 7-10). 15 tablet 0   polyethylene glycol (MIRALAX / GLYCOLAX) 17 g packet Take 17 g by mouth daily as needed for moderate constipation.     potassium chloride (KLOR-CON M) 10 MEQ tablet Take 1 tablet (10 mEq total) by mouth daily as needed (with Lasix).     sertraline (ZOLOFT) 50 MG tablet Take 75 mg by mouth daily.     tamsulosin (FLOMAX) 0.4 MG CAPS capsule Take 1 capsule (0.4 mg total) by mouth daily. 30 capsule 0   No current facility-administered medications for this visit.     Objective: Vital signs in last 24 hours: BP (!) 188/74   Pulse 86   Intake/Output from previous day: No intake/output data recorded. Intake/Output this shift: @IOTHISSHIFT @   Physical Exam Vitals reviewed.  Constitutional:      Appearance: Normal appearance.  Abdominal:     General: Abdomen is flat.     Palpations: Abdomen is soft.     Tenderness: There is abdominal tenderness (RUQ.).  Neurological:     Mental Status: She is alert.     Lab Results:  Results for orders placed or performed in visit on 09/13/23 (from the past 24 hours)  Urinalysis, Routine w reflex microscopic     Status: Abnormal   Collection Time: 09/13/23  9:11 AM  Result Value Ref Range   Specific Gravity, UA 1.015 1.005 - 1.030   pH, UA 7.0 5.0 - 7.5   Color, UA Yellow Yellow   Appearance Ur Clear Clear   Leukocytes,UA Trace (A) Negative   Protein,UA Negative Negative/Trace   Glucose, UA Negative Negative   Ketones, UA Negative Negative   RBC, UA 3+ (A) Negative   Bilirubin, UA Negative Negative   Urobilinogen, Ur 0.2 0.2 - 1.0 mg/dL   Nitrite, UA Negative Negative   Microscopic Examination See below:    Narrative   Performed at:  [address] [facility]  [place] [address], [place], [place]  [id number] Lab Director: Chinita Pester MT, Phone:  [phone]  Microscopic Examination     Status: Abnormal   Collection Time: 09/13/23  9:11 AM   Urine  Result Value Ref Range   WBC, UA 0-5 0 - 5 /hpf   RBC, Urine >30 (A) 0 - 2 /hpf   Epithelial Cells (non renal) 0-10 0 - 10 /hpf   Bacteria, UA None seen None seen/Few   Narrative   Performed at:  [address] [facility] [place] [address], [place], [place]  [id number] Lab Director: Chinita Pester MT, Phone:  [phone]        BMET No results for input(s): "NA", "K", "CL", "CO2", "GLUCOSE", "BUN", "CREATININE", "  CALCIUM" in the last 72 hours. PT/INR No results for input(s): "LABPROT", "INR" in the last 72 hours. ABG No results for input(s): "PHART", "HCO3" in the last 72 hours.  Invalid input(s): "PCO2", "PO2" Recent Results (from the past 2160 hours)  Urinalysis, Routine w reflex microscopic -Urine, Clean Catch     Status: Abnormal   Collection Time: 07/09/23 10:20 AM  Result Value Ref Range   Color, Urine COLORLESS (A) YELLOW   APPearance CLEAR CLEAR   Specific Gravity, Urine 1.002 (L) 1.005 - 1.030   pH 7.0 5.0 - 8.0   Glucose, UA NEGATIVE NEGATIVE mg/dL   Hgb urine dipstick MODERATE (A) NEGATIVE   Bilirubin Urine NEGATIVE NEGATIVE   Ketones, ur NEGATIVE NEGATIVE mg/dL   Protein, ur NEGATIVE NEGATIVE mg/dL   Nitrite NEGATIVE NEGATIVE   Leukocytes,Ua NEGATIVE NEGATIVE   RBC / HPF 0-5 0 - 5 RBC/hpf   WBC, UA 0-5 0 - 5 WBC/hpf   Bacteria, UA NONE SEEN NONE SEEN   Squamous Epithelial / HPF 0-5 0 - 5 /HPF    Comment: Performed at [facility], [address] [place], [place] [address]  CBC with Differential     Status: Abnormal   Collection Time: 07/09/23 12:48 PM  Result Value Ref Range   WBC 11.7 (H) 4.0 - 10.5 K/uL   RBC 3.77 (L) 3.87 - 5.11 MIL/uL   Hemoglobin 13.2 12.0 - 15.0 g/dL   HCT 47.8 29.5 - 62.1 %   MCV 103.2 (H) 80.0 - 100.0 fL   MCH 35.0 (H) 26.0 -  34.0 pg   MCHC 33.9 30.0 - 36.0 g/dL   RDW 30.8 65.7 - 84.6 %   Platelets 317 150 - 400 K/uL   nRBC 0.0 0.0 - 0.2 %   Neutrophils Relative % 82 %   Neutro Abs 9.5 (H) 1.7 - 7.7 K/uL   Lymphocytes Relative 10 %   Lymphs Abs 1.2 0.7 - 4.0 K/uL   Monocytes Relative 6 %   Monocytes Absolute 0.7 0.1 - 1.0 K/uL   Eosinophils Relative 1 %   Eosinophils Absolute 0.1 0.0 - 0.5 K/uL   Basophils Relative 1 %   Basophils Absolute 0.1 0.0 - 0.1 K/uL   Immature Granulocytes 0 %   Abs Immature Granulocytes 0.04 0.00 - 0.07 K/uL    Comment: Performed at [facility], [address], [place], [place] [address]  Basic metabolic panel     Status: Abnormal   Collection Time: 07/09/23 12:48 PM  Result Value Ref Range   Sodium 141 135 - 145 mmol/L   Potassium 3.6 3.5 - 5.1 mmol/L   Chloride 103 98 - 111 mmol/L   CO2 26 22 - 32 mmol/L   Glucose, Bld 122 (H) 70 - 99 mg/dL    Comment: Glucose reference range applies only to samples taken after fasting for at least 8 hours.   BUN 10 8 - 23 mg/dL   Creatinine, Ser 2.84 0.44 - 1.00 mg/dL   Calcium 9.1 8.9 - 13.2 mg/dL   GFR, Estimated >44 >01 mL/min    Comment: (NOTE) Calculated using the CKD-EPI Creatinine Equation (2021)    Anion gap 12 5 - 15    Comment: Performed at [facility], [address], [place], [place] [address]  Surgical PCR screen     Status: None   Collection Time: 07/09/23  6:34 PM   Specimen: Nasal Mucosa; Nasal Swab  Result Value Ref Range   MRSA, PCR NEGATIVE NEGATIVE  Staphylococcus aureus NEGATIVE NEGATIVE    Comment: (NOTE) The Xpert SA Assay (FDA approved for NASAL specimens in patients 65 years of age and older), is one component of a comprehensive surveillance program. It is not intended to diagnose infection nor to guide or monitor treatment. Performed at [facility], [address], [place], [place] [address]   CBC     Status: Abnormal   Collection Time: 07/11/23  4:34 AM  Result Value Ref Range   WBC 9.5 4.0 -  10.5 K/uL   RBC 2.72 (L) 3.87 - 5.11 MIL/uL   Hemoglobin 9.5 (L) 12.0 - 15.0 g/dL    Comment: REPEATED TO VERIFY DELTA CHECK NOTED    HCT 28.7 (L) 36.0 - 46.0 %   MCV 105.5 (H) 80.0 - 100.0 fL   MCH 34.9 (H) 26.0 - 34.0 pg   MCHC 33.1 30.0 - 36.0 g/dL   RDW 65.7 84.6 - 96.2 %   Platelets 224 150 - 400 K/uL   nRBC 0.0 0.0 - 0.2 %    Comment: Performed at [facility], [address] [place], [place] [address]  Basic metabolic panel     Status: Abnormal   Collection Time: 07/11/23  4:34 AM  Result Value Ref Range   Sodium 136 135 - 145 mmol/L   Potassium 3.0 (L) 3.5 - 5.1 mmol/L   Chloride 102 98 - 111 mmol/L   CO2 25 22 - 32 mmol/L   Glucose, Bld 102 (H) 70 - 99 mg/dL    Comment: Glucose reference range applies only to samples taken after fasting for at least 8 hours.   BUN 10 8 - 23 mg/dL   Creatinine, Ser 1.32 0.44 - 1.00 mg/dL   Calcium 8.0 (L) 8.9 - 10.3 mg/dL   GFR, Estimated >44 >01 mL/min    Comment: (NOTE) Calculated using the CKD-EPI Creatinine Equation (2021)    Anion gap 9 5 - 15    Comment: Performed at [facility], [address] [place], [place] [address]  CBC     Status: Abnormal   Collection Time: 07/12/23  4:39 AM  Result Value Ref Range   WBC 9.0 4.0 - 10.5 K/uL   RBC 2.69 (L) 3.87 - 5.11 MIL/uL   Hemoglobin 9.5 (L) 12.0 - 15.0 g/dL   HCT 36.6 (L) 44.0 - 34.7 %   MCV 104.8 (H) 80.0 - 100.0 fL   MCH 35.3 (H) 26.0 - 34.0 pg   MCHC 33.7 30.0 - 36.0 g/dL   RDW 42.5 95.6 - 38.7 %   Platelets 235 150 - 400 K/uL   nRBC 0.0 0.0 - 0.2 %    Comment: Performed at [facility], [address], [place], [place] [address]  Basic metabolic panel     Status: Abnormal   Collection Time: 07/12/23  4:39 AM  Result Value Ref Range   Sodium 134 (L) 135 - 145 mmol/L   Potassium 3.0 (L) 3.5 - 5.1 mmol/L   Chloride 101 98 - 111 mmol/L   CO2 24 22 - 32 mmol/L   Glucose, Bld 101 (H) 70 - 99 mg/dL    Comment: Glucose reference range applies only to samples taken after  fasting for at least 8 hours.   BUN 9 8 - 23 mg/dL   Creatinine, Ser 2.95 0.44 - 1.00 mg/dL   Calcium 8.2 (L) 8.9 - 10.3 mg/dL   GFR, Estimated >18 >84 mL/min    Comment: (NOTE) Calculated using the CKD-EPI Creatinine Equation (2021)  Anion gap 9 5 - 15    Comment: Performed at [facility], [address] [place], [place] [address]  Urinalysis, Routine w reflex microscopic -Urine, Clean Catch     Status: Abnormal   Collection Time: 08/25/23 11:41 AM  Result Value Ref Range   Color, Urine RED (A) YELLOW    Comment: BIOCHEMICALS MAY BE AFFECTED BY COLOR   APPearance HAZY (A) CLEAR   Specific Gravity, Urine 1.008 1.005 - 1.030   pH 6.0 5.0 - 8.0   Glucose, UA NEGATIVE NEGATIVE mg/dL   Hgb urine dipstick LARGE (A) NEGATIVE   Bilirubin Urine NEGATIVE NEGATIVE   Ketones, ur NEGATIVE NEGATIVE mg/dL   Protein, ur 401 (A) NEGATIVE mg/dL   Nitrite NEGATIVE NEGATIVE   Leukocytes,Ua TRACE (A) NEGATIVE   RBC / HPF >50 0 - 5 RBC/hpf   WBC, UA >50 0 - 5 WBC/hpf   Bacteria, UA FEW (A) NONE SEEN   Squamous Epithelial / HPF 0-5 0 - 5 /HPF   WBC Clumps PRESENT     Comment: Performed at [facility], [address], [place], [place] [address]  Urine Culture     Status: Abnormal   Collection Time: 08/25/23 11:41 AM   Specimen: Urine, Clean Catch  Result Value Ref Range   Specimen Description      URINE, CLEAN CATCH Performed at [facility], [address] [place], [place] [address]    Special Requests      NONE Performed at [facility], [address] [place], [place] [address]    Culture (A)     >=100,000 COLONIES/mL STREPTOCOCCUS AGALACTIAE TESTING AGAINST S. AGALACTIAE NOT ROUTINELY PERFORMED DUE TO PREDICTABILITY OF AMP/PEN/VAN SUSCEPTIBILITY. Performed at [facility], [address], [place], [place] [address]    Report Status 08/27/2023 FINAL   CBC with Differential     Status: Abnormal   Collection Time: 08/25/23 12:46 PM  Result Value Ref Range   WBC 7.1 4.0 - 10.5  K/uL   RBC 3.61 (L) 3.87 - 5.11 MIL/uL   Hemoglobin 12.1 12.0 - 15.0 g/dL   HCT 38.7 56.4 - 33.2 %   MCV 101.7 (H) 80.0 - 100.0 fL   MCH 33.5 26.0 - 34.0 pg   MCHC 33.0 30.0 - 36.0 g/dL   RDW 95.1 88.4 - 16.6 %   Platelets 314 150 - 400 K/uL   nRBC 0.0 0.0 - 0.2 %   Neutrophils Relative % 71 %   Neutro Abs 5.1 1.7 - 7.7 K/uL   Lymphocytes Relative 20 %   Lymphs Abs 1.4 0.7 - 4.0 K/uL   Monocytes Relative 6 %   Monocytes Absolute 0.4 0.1 - 1.0 K/uL   Eosinophils Relative 2 %   Eosinophils Absolute 0.1 0.0 - 0.5 K/uL   Basophils Relative 1 %   Basophils Absolute 0.1 0.0 - 0.1 K/uL   Immature Granulocytes 0 %   Abs Immature Granulocytes 0.01 0.00 - 0.07 K/uL    Comment: Performed at [facility], [address] [place], [place] [address]  Comprehensive metabolic panel     Status: Abnormal   Collection Time: 08/25/23 12:46 PM  Result Value Ref Range   Sodium 139 135 - 145 mmol/L   Potassium 3.6 3.5 - 5.1 mmol/L   Chloride 103 98 - 111 mmol/L   CO2 26 22 - 32 mmol/L   Glucose, Bld 108 (H) 70 - 99 mg/dL    Comment: Glucose reference range applies only to samples taken  after fasting for at least 8 hours.   BUN 9 8 - 23 mg/dL   Creatinine, Ser 2.95 0.44 - 1.00 mg/dL   Calcium 9.3 8.9 - 62.1 mg/dL   Total Protein 8.5 (H) 6.5 - 8.1 g/dL   Albumin 3.7 3.5 - 5.0 g/dL   AST 29 15 - 41 U/L   ALT 13 0 - 44 U/L   Alkaline Phosphatase 84 38 - 126 U/L   Total Bilirubin 0.7 <1.2 mg/dL   GFR, Estimated >30 >86 mL/min    Comment: (NOTE) Calculated using the CKD-EPI Creatinine Equation (2021)    Anion gap 10 5 - 15    Comment: Performed at [facility], [address], [place], [place] [address]  Urinalysis, Routine w reflex microscopic     Status: Abnormal   Collection Time: 09/13/23  9:11 AM  Result Value Ref Range   Specific Gravity, UA 1.015 1.005 - 1.030   pH, UA 7.0 5.0 - 7.5   Color, UA Yellow Yellow   Appearance Ur Clear Clear   Leukocytes,UA Trace (A) Negative   Protein,UA  Negative Negative/Trace   Glucose, UA Negative Negative   Ketones, UA Negative Negative   RBC, UA 3+ (A) Negative   Bilirubin, UA Negative Negative   Urobilinogen, Ur 0.2 0.2 - 1.0 mg/dL   Nitrite, UA Negative Negative   Microscopic Examination See below:     Comment: Microscopic was indicated and was performed.  Microscopic Examination     Status: Abnormal   Collection Time: 09/13/23  9:11 AM   Urine  Result Value Ref Range   WBC, UA 0-5 0 - 5 /hpf   RBC, Urine >30 (A) 0 - 2 /hpf   Epithelial Cells (non renal) 0-10 0 - 10 /hpf   Bacteria, UA None seen None seen/Few    UA has >30 RBC's with 0-5 WBC and a few bacteria.   Studies/Results: DG Abd 1 View Result Date: 09/13/2023 CLINICAL DATA:  Nephrolithiasis EXAM: ABDOMEN - 1 VIEW COMPARISON:  08/25/2023 FINDINGS: The bowel gas pattern is normal. Numerous calcifications project over the bilateral renal shadows. The slightly elongated stone previously seen at the right UPJ is not definitively seen at this location on today's exam. Stool is present throughout the colon. Extensive lumbosacral fusion. Ununited right femoral neck fracture status post ORIF. IMPRESSION: Numerous calcifications project over the bilateral renal shadows. The slightly elongated stone previously seen at the right UPJ is not definitively seen at this location on today's exam. Electronically Signed   By: Duanne Guess D.O.   On: 09/13/2023 10:36   DG Abd 1 View Result Date: 09/13/2023 CLINICAL DATA:  Nephrolithiasis EXAM: ABDOMEN - 1 VIEW COMPARISON:  08/25/2023 FINDINGS: The bowel gas pattern is normal. Numerous calcifications project over the bilateral renal shadows. The slightly elongated stone previously seen at the right UPJ is not definitively seen at this location on today's exam. Stool is present throughout the colon. Extensive lumbosacral fusion. Ununited right femoral neck fracture status post ORIF. IMPRESSION: Numerous calcifications project over the  bilateral renal shadows. The slightly elongated stone previously seen at the right UPJ is not definitively seen at this location on today's exam. Electronically Signed   By: Duanne Guess D.O.   On: 09/13/2023 10:36   DG HIP UNILAT WITH PELVIS 2-3 VIEWS RIGHT Result Date: 09/07/2023 Imaging right hip that is post OTIF with cannulated screws 3 cannulated screws are seen in the right hip to fully threaded 1 partially-threaded with washers The area  is a fracture of the femoral neck which looks stable and the hardware looks to be intact There is some comminution noted on the superolateral portion of the femoral neck.  This is followed closely with serial imaging has not changed Healing fracture right femoral neck with no apparent complications   CT Renal Stone Study Result Date: 08/25/2023 CLINICAL DATA:  Abdominal/flank pain, stone suspected. EXAM: CT ABDOMEN AND PELVIS WITHOUT CONTRAST TECHNIQUE: Multidetector CT imaging of the abdomen and pelvis was performed following the standard protocol without IV contrast. RADIATION DOSE REDUCTION: This exam was performed according to the departmental dose-optimization program which includes automated exposure control, adjustment of the mA and/or kV according to patient size and/or use of iterative reconstruction technique. COMPARISON:  CT scan abdomen and pelvis from 03/12/2023. FINDINGS: Lower chest: There are subpleural atelectatic changes in the visualized lung bases. No overt consolidation. No pleural effusion. The heart is normal in size. No pericardial effusion. Hepatobiliary: The liver is normal in size. Non-cirrhotic configuration. No suspicious mass. No intrahepatic or extrahepatic bile duct dilation. No calcified gallstones. Normal gallbladder wall thickness. No pericholecystic inflammatory changes. Pancreas: Unremarkable. No pancreatic ductal dilatation or surrounding inflammatory changes. Spleen: Within normal limits. No focal lesion. Adrenals/Urinary  Tract: Adrenal glands are unremarkable. No suspicious renal mass seen within the limitations of this unenhanced exam. There is a 4 x 7 mm right pelviureteric junction calculus causing mild to moderate proximal hydronephrosis. There are additional at least 4, sub 4 mm calculi in the right kidney. There are at least 5, sub 5 mm calculi in the left kidney. There is mild left hydronephrosis and entire hydroureter. However, no discrete obstructing mass or ureterolithiasis seen. Unremarkable urinary bladder. Stomach/Bowel: There is a small sliding hiatal hernia. No disproportionate dilation of the small or large bowel loops. No evidence of abnormal bowel wall thickening or inflammatory changes. The appendix was not visualized; however there is no acute inflammatory process in the right lower quadrant. Vascular/Lymphatic: No ascites or pneumoperitoneum. No abdominal or pelvic lymphadenopathy, by size criteria. No aneurysmal dilation of the major abdominal arteries. There are mild peripheral atherosclerotic vascular calcifications of the aorta and its major branches. Reproductive: The uterus is surgically absent. No large adnexal mass. Other: The visualized soft tissues and abdominal wall are unremarkable. Musculoskeletal: No suspicious osseous lesions. There are moderate multilevel degenerative changes in the visualized spine. Posterior spinal fixation of L2 through S1 noted. There also 3 cortical screw decompressing the right femoral head. IMPRESSION: 1. There is a 4 x 7 mm right pelviureteric junction calculus causing mild-to-moderate proximal obstructive uropathy. 2. There are multiple additional bilateral nephrolithiasis, as described above. 3. There is mild left hydronephrosis and entire hydroureter without discrete obstructing mass or ureterolithiasis. Findings may be due to reflux or stricture at the vesicoureteric junction region. 4. Multiple other nonacute observations, as described above. Electronically Signed    By: Jules Schick M.D.   On: 08/25/2023 13:13   DG HIP UNILAT WITH PELVIS 2-3 VIEWS RIGHT Result Date: 08/09/2023 Imaging right hip status post ORIF right hip with 3 cannulated screws X-rays show 3 screws in the right hip with washers Compared to IntraOp film x-rays show no change in position of the fracture It should be noted at the superior portion of the neck on the AP view there is area that looks like comminution When compared to the preop CT scan this is not shown on the CT Impression stable fixation right hip status post cannulated screw fixation with note of  superior small area of comminution   DG C-Arm 1-60 Min Addendum Date: 07/23/2023 ADDENDUM REPORT: 07/23/2023 08:13 ADDENDUM: History of right fixation although laterality not indicated on the images. Electronically Signed   By: Corlis Leak M.D.   On: 07/23/2023 08:13   Result Date: 07/23/2023 CLINICAL DATA:  Hip pain EXAM: DG C-ARM 1-60 MIN COMPARISON:  CT from previous day FINDINGS: Minimally displaced subcapital femoral fracture is less conspicuous on these fluoroscopic spot images, which document placement of 2 orthopedic pins across the femoral neck. IMPRESSION: Orthopedic fixation of subcapital femoral fracture. Electronically Signed: By: Corlis Leak M.D. On: 07/10/2023 17:39   DG Chest Port 1 View Result Date: 07/11/2023 CLINICAL DATA:  88 year old female with bilateral rib and chest pain. EXAM: PORTABLE CHEST 1 VIEW COMPARISON:  Portable chest 12/10/2022 and earlier. FINDINGS: Portable AP semi upright view at 0651 hours. Partially visible chronic upper lumbar spine fusion hardware. Large lung volumes. Regressed and largely resolved bilateral pleural effusions since March. Stable mild cardiomegaly. Other mediastinal contours are within normal limits. Visualized tracheal air column is within normal limits. No pneumothorax or pulmonary edema. No consolidation. Possible small residual pleural effusions. Negative visible bowel gas.  Osteopenia. No acute osseous abnormality identified. IMPRESSION: 1. Substantial regression of bilateral pleural effusions since March, possible trace residual. 2. No new cardiopulmonary abnormality. No acute osseous abnormality identified. Electronically Signed   By: Odessa Fleming M.D.   On: 07/11/2023 07:09   DG HIP UNILAT WITH PELVIS 2-3 VIEWS RIGHT Result Date: 07/10/2023 CLINICAL DATA:  Fracture EXAM: DG HIP (WITH OR WITHOUT PELVIS) 2-3V RIGHT COMPARISON:  the previous day's study FINDINGS: A series of fluoroscopic intraoperative images. The subcapital fracture seen on prior imaging less conspicuous. Images document placement of 3 pins across the femoral neck. Near anatomic alignment. IMPRESSION: Instrumented fixation right femoral neck fracture. Electronically Signed   By: Corlis Leak M.D.   On: 07/10/2023 17:41   DG HIP UNILAT WITH PELVIS 2-3 VIEWS RIGHT Result Date: 07/10/2023 CLINICAL DATA:  Status post hardware fixation of the right femoral neck fracture. EXAM: DG HIP (WITH OR WITHOUT PELVIS) 2-3V RIGHT COMPARISON:  Right hip CT dated 07/09/2023. FINDINGS: Inner or placement of 3 screws bridging the right femoral neck fracture with anatomic position and alignment of the major fragments in the frontal projection, difficult to visualize in the lateral projection due to overlying soft tissues. Diffuse osteopenia. Lumbar spine fixation hardware. IMPRESSION: Status post internal fixation of the right femoral neck fracture with anatomic position and alignment of the major fragments in the frontal projection. Electronically Signed   By: Beckie Salts M.D.   On: 07/10/2023 15:57   CT Hip Right Wo Contrast Result Date: 07/09/2023 CLINICAL DATA:  Fracture, hip EXAM: CT OF THE RIGHT HIP WITHOUT CONTRAST TECHNIQUE: Multidetector CT imaging of the right hip was performed according to the standard protocol. Multiplanar CT image reconstructions were also generated. RADIATION DOSE REDUCTION: This exam was performed  according to the departmental dose-optimization program which includes automated exposure control, adjustment of the mA and/or kV according to patient size and/or use of iterative reconstruction technique. COMPARISON:  Same-day radiograph of the right hip at 11:27 a.m. FINDINGS: Bones/Joint/Cartilage There is an acute fracture of the right subcapital femoral head/neck with mild foreshortening. The right femoral head is seated within the acetabulum. No additional acute fracture identified. Remote healed right inferior pubic ramus fracture. The right sacroiliac joint and pubic symphysis are anatomically aligned. Degenerative changes of the right hip. Chondrocalcinosis of the  pubic symphysis and right hip. Partially visualized lumbosacral fusion hardware. Ligaments Suboptimally assessed by CT. Soft tissues/Muscles. Soft tissue swelling overlying the right lateral hip is noted. No discrete fluid collection or hematoma identified. No enlarged lymph nodes identified in the field of view. The visualized intra-abdominal contents are unremarkable. IMPRESSION: 1. Acute right subcapital femoral head/neck fracture with mild foreshortening. The right femoral head is seated within the acetabulum. 2. Remote healed right inferior pubic ramus fracture. Electronically Signed   By: Hart Robinsons M.D.   On: 07/09/2023 16:35   DG HIP UNILAT WITH PELVIS 2-3 VIEWS RIGHT Result Date: 07/09/2023 CLINICAL DATA:  [id number] Pain [id number] EXAM: DG HIP (WITH OR WITHOUT PELVIS) 2-3V RIGHT COMPARISON:  None Available. FINDINGS: There is subtle cortical irregularity along the medial border of the right femoral neck, along with relative foreshortening of the femoral neck relative to the contralateral side. Advanced osteopenia. Soft tissues unremarkable. IMPRESSION: Subtle cortical irregularity and foreshortening of the right femoral neck raises suspicion for impacted/displaced fracture. Further evaluation with cross-sectional imaging is advised.  Electronically Signed   By: Olive Bass M.D.   On: 07/09/2023 13:23   DG Lumbar Spine Complete Result Date: 07/09/2023 CLINICAL DATA:  fall EXAM: LUMBAR SPINE - COMPLETE 4+ VIEW COMPARISON:  None Available. FINDINGS: Status post L2-S1 posterior lumbosacral fusion. Hardware alignment appears grossly within normal limits findings of hardware fracture. No compression deformity identified. Advanced osteopenia. Paravertebral soft tissues are IMPRESSION: Postsurgical changes L2-S1 posterior lumbosacral fusion. No acute fracture or hardware complicating feature is identified, exam is somewhat limited advanced osteopenia. Consider CT for further evaluation if appropriate. Electronically Signed   By: Olive Bass M.D.   On: 07/09/2023 13:17   CT Head Wo Contrast Result Date: 07/09/2023 CLINICAL DATA:  Neck trauma.  Fall.  On blood thinner. EXAM: CT HEAD WITHOUT CONTRAST CT CERVICAL SPINE WITHOUT CONTRAST TECHNIQUE: Multidetector CT imaging of the head and cervical spine was performed following the standard protocol without intravenous contrast. Multiplanar CT image reconstructions of the cervical spine were also generated. RADIATION DOSE REDUCTION: This exam was performed according to the departmental dose-optimization program which includes automated exposure control, adjustment of the mA and/or kV according to patient size and/or use of iterative reconstruction technique. COMPARISON:  None Available. FINDINGS: CT HEAD FINDINGS Brain: No evidence of acute infarction, hemorrhage, hydrocephalus, extra-axial collection or mass lesion/mass effect. Vascular: No hyperdense vessel or unexpected calcification. Skull: Normal. Negative for fracture or focal lesion. Sinuses/Orbits: No evidence of injury. Mild mucosal thickening in the paranasal sinuses. CT CERVICAL SPINE FINDINGS Alignment: Normal. Skull base and vertebrae: No acute fracture. No primary bone lesion or focal pathologic process. Soft tissues and spinal canal:  No prevertebral fluid or swelling. No visible canal hematoma. Goiter Disc levels:  Much less than typical degenerative change for age. Upper chest: No evidence of injury IMPRESSION: No evidence of intracranial or cervical spine injury. Electronically Signed   By: Tiburcio Pea M.D.   On: 07/09/2023 12:06   CT Cervical Spine Wo Contrast Result Date: 07/09/2023 CLINICAL DATA:  Neck trauma.  Fall.  On blood thinner. EXAM: CT HEAD WITHOUT CONTRAST CT CERVICAL SPINE WITHOUT CONTRAST TECHNIQUE: Multidetector CT imaging of the head and cervical spine was performed following the standard protocol without intravenous contrast. Multiplanar CT image reconstructions of the cervical spine were also generated. RADIATION DOSE REDUCTION: This exam was performed according to the departmental dose-optimization program which includes automated exposure control, adjustment of the mA and/or kV according to patient size and/or  use of iterative reconstruction technique. COMPARISON:  None Available. FINDINGS: CT HEAD FINDINGS Brain: No evidence of acute infarction, hemorrhage, hydrocephalus, extra-axial collection or mass lesion/mass effect. Vascular: No hyperdense vessel or unexpected calcification. Skull: Normal. Negative for fracture or focal lesion. Sinuses/Orbits: No evidence of injury. Mild mucosal thickening in the paranasal sinuses. CT CERVICAL SPINE FINDINGS Alignment: Normal. Skull base and vertebrae: No acute fracture. No primary bone lesion or focal pathologic process. Soft tissues and spinal canal: No prevertebral fluid or swelling. No visible canal hematoma. Goiter Disc levels:  Much less than typical degenerative change for age. Upper chest: No evidence of injury IMPRESSION: No evidence of intracranial or cervical spine injury. Electronically Signed   By: Tiburcio Pea M.D.   On: 07/09/2023 12:06   I have reviewed her notes, images and labs in EPIC.   Assessment/Plan: Right UPJ stone.  She is having some residual  pain.   I will get a KUB to see if we can see the stone.  (Radiologist didn't think they saw the stone but I believe it is in the lower proximal ureter on the right)  f/u as planned with KUB and renal US.  Gross hematuria.  She had increased hematuria with the stone but that has improved.   Renal stones.   She has bilateral small renal stones.    Chronic cystitis.   She had a positive culture in the ER but no fever and has completed an antbiotic.    Sensation of incomplete emptying.   That has improved.   No orders of the defined types were placed in this encounter.    Orders Placed This Encounter  Procedures   Microscopic Examination   DG Abd 1 View    Reason for Exam (SYMPTOM  OR DIAGNOSIS REQUIRED):   right UPJ stone    Preferred imaging location?:   [facility]    Radiology Contrast Protocol - do NOT remove file path:   [email or web address]   DG Abd 1 View    Standing Status:   Future    Expected Date:   10/14/2023    Expiration Date:   12/12/2023    Reason for Exam (SYMPTOM  OR DIAGNOSIS REQUIRED):   right UPJ stone.    Preferred imaging location?:   [facility]    Radiology Contrast Protocol - do NOT remove file path:   [email or web address]   US RENAL    Standing Status:   Future    Expected Date:   10/14/2023    Expiration Date:   12/12/2023    Reason for Exam (SYMPTOM  OR DIAGNOSIS REQUIRED):   right UPJ stone with obstruction.    Preferred imaging location?:   [facility]   Urinalysis, Routine w reflex microscopic     Return in about 1 month (around 10/14/2023) for with KUB and renal US. .    CC: Dr. Fara Chute.      Bjorn Pippin 09/14/2023

## 2023-09-14 ENCOUNTER — Telehealth: Payer: Self-pay

## 2023-09-14 NOTE — Telephone Encounter (Signed)
-----   Message from Bjorn Pippin sent at 09/14/2023  7:58 AM EST ----- Regarding: stone Ilooked at the KUB and I believe the stone has moved down some.   She should f/u as planned but get back sooner if she has increased pain or fever.

## 2023-09-14 NOTE — Telephone Encounter (Signed)
Pt daughter was called to give results information per MD. Pt daughter wanted to know if her mom still had to get the [place] since you could see the stone on the xray ?

## 2023-09-14 NOTE — Telephone Encounter (Signed)
Pt was called to let her know MD still wants US done

## 2023-09-14 NOTE — Telephone Encounter (Signed)
Pt advised.

## 2023-10-01 ENCOUNTER — Other Ambulatory Visit: Payer: Self-pay | Admitting: Urology

## 2023-10-01 ENCOUNTER — Other Ambulatory Visit: Payer: PPO

## 2023-10-01 ENCOUNTER — Telehealth: Payer: Self-pay | Admitting: Urology

## 2023-10-01 DIAGNOSIS — N390 Urinary tract infection, site not specified: Secondary | ICD-10-CM

## 2023-10-01 DIAGNOSIS — R399 Unspecified symptoms and signs involving the genitourinary system: Secondary | ICD-10-CM

## 2023-10-01 DIAGNOSIS — R8290 Unspecified abnormal findings in urine: Secondary | ICD-10-CM

## 2023-10-01 LAB — URINALYSIS, ROUTINE W REFLEX MICROSCOPIC
Bilirubin, UA: NEGATIVE
Glucose, UA: NEGATIVE
Ketones, UA: NEGATIVE
Nitrite, UA: NEGATIVE
Specific Gravity, UA: 1.02 (ref 1.005–1.030)
Urobilinogen, Ur: 1 mg/dL (ref 0.2–1.0)
pH, UA: 6.5 (ref 5.0–7.5)

## 2023-10-01 LAB — MICROSCOPIC EXAMINATION
RBC, Urine: >30 /[HPF] — AB (ref 0–2)
WBC, UA: >30 /[HPF] — AB (ref 0–5)

## 2023-10-01 MED ORDER — NITROFURANTOIN MONOHYD MACRO 100 MG PO CAPS: 100.0000 mg | ORAL_CAPSULE | Freq: Two times a day (BID) | ORAL | 0 refills | Status: AC

## 2023-10-01 NOTE — Progress Notes (Signed)
Patient provided urine sample at lab today for acute UTI symptoms, which appeared grossly abnormal. Macrobid (Nitrofurantoin) 100 mg 2x/day x7 days prescribed for empiric treatment while awaiting urine culture results and sensitivities.  Evette Georges, MSN, FNP-C, [facility] Nurse Practitioner [facility] [place]

## 2023-10-01 NOTE — Telephone Encounter (Signed)
Return call to patient's daughter. Rinda is aware to do a urine drop off due to no opening available. Rinda voiced understanding.

## 2023-10-01 NOTE — Telephone Encounter (Signed)
Mother is having pain with urination. No appts open. Thinks she has UTI

## 2023-10-02 ENCOUNTER — Telehealth: Payer: Self-pay

## 2023-10-02 NOTE — Telephone Encounter (Signed)
-----   Message from Lauraine JAYSON Oz sent at 10/01/2023  4:53 PM EST ----- Please let pt know that her urine sample was abnormal. Based on that + reported symptoms suspicious for acute UTI, Macrobid  (Nitrofurantoin ) 100 mg 2x/day x7 days prescribed for empiric treatment while awaiting urine culture results and sensitivities.

## 2023-10-02 NOTE — Telephone Encounter (Signed)
 Patient's daughter Rinda was made aware and voiced understanding.

## 2023-10-04 ENCOUNTER — Other Ambulatory Visit: Payer: Self-pay | Admitting: Urology

## 2023-10-04 DIAGNOSIS — N3000 Acute cystitis without hematuria: Secondary | ICD-10-CM

## 2023-10-04 DIAGNOSIS — R399 Unspecified symptoms and signs involving the genitourinary system: Secondary | ICD-10-CM

## 2023-10-04 LAB — URINE CULTURE

## 2023-10-04 MED ORDER — AMOXICILLIN-POT CLAVULANATE 875-125 MG PO TABS: 1.0000 | ORAL_TABLET | Freq: Two times a day (BID) | ORAL | 0 refills | Status: AC

## 2023-10-05 ENCOUNTER — Ambulatory Visit (HOSPITAL_COMMUNITY)
Admission: RE | Admit: 2023-10-05 | Discharge: 2023-10-05 | Disposition: A | Discharge status: Home / Self Care | Payer: PPO | Source: Ambulatory Visit | Attending: Urology | Admitting: Urology

## 2023-10-05 ENCOUNTER — Telehealth: Payer: Self-pay

## 2023-10-05 DIAGNOSIS — N1330 Unspecified hydronephrosis: Secondary | ICD-10-CM | POA: Diagnosis not present

## 2023-10-05 DIAGNOSIS — N132 Hydronephrosis with renal and ureteral calculous obstruction: Secondary | ICD-10-CM | POA: Diagnosis not present

## 2023-10-05 DIAGNOSIS — I878 Other specified disorders of veins: Secondary | ICD-10-CM | POA: Diagnosis not present

## 2023-10-05 DIAGNOSIS — N202 Calculus of kidney with calculus of ureter: Secondary | ICD-10-CM | POA: Diagnosis not present

## 2023-10-05 DIAGNOSIS — N201 Calculus of ureter: Secondary | ICD-10-CM | POA: Insufficient documentation

## 2023-10-05 NOTE — Telephone Encounter (Signed)
 Called Pt to relay message from NP. Pt said she'd pick up Rx from pharmacy

## 2023-10-05 NOTE — Telephone Encounter (Signed)
-----   Message from Donnita [place] sent at 10/04/2023  4:00 PM EST ----- Please let pt know that urine culture was positive. Based on findings need to discontinue Macrobid and start Augmentin as preferred antibiotic; prescription sent. Thanks.

## 2023-10-08 ENCOUNTER — Ambulatory Visit (INDEPENDENT_AMBULATORY_CARE_PROVIDER_SITE_OTHER): Payer: PPO | Admitting: Orthopedic Surgery

## 2023-10-08 ENCOUNTER — Other Ambulatory Visit (INDEPENDENT_AMBULATORY_CARE_PROVIDER_SITE_OTHER): Payer: PPO

## 2023-10-08 VITALS — BP 178/73 | HR 78

## 2023-10-08 DIAGNOSIS — S72001D Fracture of unspecified part of neck of right femur, subsequent encounter for closed fracture with routine healing: Secondary | ICD-10-CM

## 2023-10-08 DIAGNOSIS — M7061 Trochanteric bursitis, right hip: Secondary | ICD-10-CM | POA: Diagnosis not present

## 2023-10-08 MED ORDER — METHYLPREDNISOLONE ACETATE 40 MG/ML IJ SUSP
40.0000 mg | Freq: Once | INTRAMUSCULAR | Status: AC
  Administered 2023-10-08: 40 mg via INTRA_ARTICULAR

## 2023-10-08 NOTE — Progress Notes (Signed)
 Post op Office Visit Note   Patient: Tanya Mueller           Date of Birth: 28-Apr-1935           MRN: [id number] Visit Date: 10/08/2023 Requested by: Sasser, Deward ORN, MD [address] [place],  [place] [address] PCP: Atilano Deward ORN, MD   Encounter Diagnosis  Name Primary?   Closed fracture of right hip with routine healing, subsequent encounter: otif  (07/08/23) Yes   Chief Complaint  Patient presents with   Routine Post Op    Right HIP DOS 07/10/23   Allergies  Allergen Reactions   Morphine  Other (See Comments)    Nervous   PAIN MED: Oxycodone  15 mg from chronic back pain [id number] Eliquis   Increased pain right hip, groin, thigh, right greater troch.   Clinical exam shows no pain with hip flexion extension some pain with abduction Internal/external rotation seem good  IMAGING: DG HIP UNILAT WITH PELVIS 2-3 VIEWS RIGHT Result Date: 10/08/2023 Imaging right hip ORIF right hip July 10, 2023 3 screws in the right hip with washer The fracture has maintained alignment the screws have maintained position At the superolateral head neck area there is a V shaped defect in the bone this is also noted on the lateral this has not changed since the original postop imaging  Impression stable fixation of the right femoral neck fracture without hardware complication     ASSESSMENT AND PLAN:  Rex-ray in 4 to 6 weeks.  Superolateral defect has persisted since the original postop x-rays no evidence of varus collapse no evidence of avascular necrosis  Repeat imaging in 4 to 6 weeks and continue to follow this area for complete healing  Encounter Diagnoses  Name Primary?   Closed fracture of right hip with routine healing, subsequent encounter: otif  (07/08/23) Yes   Greater trochanteric bursitis of right hip    Procedure note injection for right hip bursitis  Verbal consent was obtained for injection of the right hip  Timeout was completed to confirm the injection site  The  medications used were 40 mg of Depo-Medrol  and 1% lidocaine  3 cc  Anesthesia was provided by ethyl chloride and the skin was prepped with alcohol.  After cleaning the skin with alcohol a 25-gauge needle was used to inject the greater trochanteric bursa right hip   No complications were noted

## 2023-10-08 NOTE — Progress Notes (Deleted)
   There were no vitals taken for this visit.  There is no height or weight on file to calculate BMI.  No chief complaint on file.   Encounter Diagnosis  Name Primary?   Closed fracture of right hip with routine healing, subsequent encounter: otif  (07/08/23) Yes    DOI/DOS/ Date: 07/08/23  {CHL AMB ORT SYMPTOMS POST TREATMENT:21798}

## 2023-10-08 NOTE — Progress Notes (Signed)
   There were no vitals taken for this visit.  There is no height or weight on file to calculate BMI.  No chief complaint on file.   Encounter Diagnosis  Name Primary?   Closed fracture of right hip with routine healing, subsequent encounter: otif  (07/08/23) Yes    DOI/DOS/ Date: 07/10/23  Worse can't think of anything I have done to make it worse unless I moved the wrong way. Been like this for several days.

## 2023-10-12 ENCOUNTER — Other Ambulatory Visit: Payer: Self-pay | Admitting: Gastroenterology

## 2023-10-12 DIAGNOSIS — K581 Irritable bowel syndrome with constipation: Secondary | ICD-10-CM

## 2023-10-12 DIAGNOSIS — T402X5A Adverse effect of other opioids, initial encounter: Secondary | ICD-10-CM

## 2023-10-15 ENCOUNTER — Telehealth: Payer: Self-pay | Admitting: Internal Medicine

## 2023-10-15 MED ORDER — DILTIAZEM HCL 30 MG PO TABS: 30.0000 mg | ORAL_TABLET | Freq: Three times a day (TID) | ORAL | 1 refills | Status: AC | PRN

## 2023-10-15 NOTE — Progress Notes (Signed)
Name: Tanya Mueller DOB: 1935/07/30 MRN: [id number]  History of Present Illness: Tanya Mueller is a 89 y.o. female who presents today for follow up visit at [facility]. She is accompanied by her daughter Tanya Mueller. - GU History: 1. Kidney stones. - Multiple prior stone procedures. 2. OAB with urinary frequency and urgency. - Previously failed Vesicare and Myrbetriq.  CT stone on 08/25/2023 showed:  1. A 4 x 7 mm right pelviureteric junction calculus causing mild-to-moderate proximal obstructive uropathy. 2. Multiple additional bilateral nephrolithiasis. 3. Mild left hydronephrosis and entire hydroureter without discrete obstructing mass or ureterolithiasis.   At last visit with Dr. Annabell Howells on 09/13/2023: - Reported occasional right flank pain.   Since last visit: > 10/01/2023: Urine culture positive for >100k Beta hemolytic Streptococcus, group B. Discontinue empiric Macrobid; Augmentin prescribed.  > 10/05/2023:  - KUB showed "Poor visualization of the renal shadows due to overlying bowel. The bowel gas pattern is normal. Phleboliths overlie the pelvis. No definite radio-opaque calculi or other significant radiographic abnormality are seen." - RUS showed mild-to-moderate right hydronephrosis, mild left hydronephrosis, and "Left renal 0.6 cm mid sinus shadowing focus, possibly representing a stone versus vascular calcification."  Today: Reports mild low grade fever over the past week or so (<100 F), occasional nausea, and tremor (which has occurred previously when anxious / stressed).   Reports chronic pain in multiple locations including bilateral mid/low back, right hip / thigh, etc. Denies acute flank pain or abdominal pain. She takes opioids routinely for pain as prescribed elsewhere.  She denies increased urinary urgency, frequency, nocturia, dysuria, gross hematuria, hesitancy, straining to void, or sensations of incomplete emptying.   Fall Screening: Do you  usually have a device to assist in your mobility? Yes  - walker   Medications: Current Outpatient Medications  Medication Sig Dispense Refill   alendronate (FOSAMAX) 70 MG tablet Take by mouth.     amoxicillin-clavulanate (AUGMENTIN) 875-125 MG tablet Take 1 tablet by mouth every 12 (twelve) hours. 14 tablet 0   apixaban (ELIQUIS) 5 MG TABS tablet Take 1 tablet (5 mg total) by mouth 2 (two) times daily.     diltiazem (CARDIZEM CD) 120 MG 24 hr capsule Take 1 capsule (120 mg total) by mouth daily. 90 capsule 2   diltiazem (CARDIZEM) 30 MG tablet Take 1 tablet (30 mg total) by mouth every 8 (eight) hours as needed (Palpitations). 180 tablet 1   fluticasone (FLONASE) 50 MCG/ACT nasal spray Place 2 sprays into the nose daily as needed for allergies.     furosemide (LASIX) 20 MG tablet Take 1 tablet (20 mg total) by mouth daily as needed for fluid or edema.     losartan (COZAAR) 50 MG tablet Take 1 tablet (50 mg total) by mouth daily. 90 tablet 3   ondansetron (ZOFRAN-ODT) 4 MG disintegrating tablet Take 1 tablet (4 mg total) by mouth every 8 (eight) hours as needed. 20 tablet 0   oxyCODONE (ROXICODONE) 15 MG immediate release tablet Take 1 tablet (15 mg total) by mouth every 6 (six) hours as needed for severe pain. 30 tablet 0   oxyCODONE-acetaminophen (PERCOCET/ROXICET) 5-325 MG tablet Take 1 tablet by mouth every 6 (six) hours as needed for severe pain (pain score 7-10). 15 tablet 0   polyethylene glycol (MIRALAX / GLYCOLAX) 17 g packet Take 17 g by mouth daily as needed for moderate constipation.     potassium chloride (KLOR-CON M) 10 MEQ tablet Take 1 tablet (10 mEq total) by  mouth daily as needed (with Lasix).     Prucalopride Succinate (MOTEGRITY) 2 MG TABS Take 1 tablet by mouth once daily 30 tablet 0   sertraline (ZOLOFT) 50 MG tablet Take 75 mg by mouth daily.     tamsulosin (FLOMAX) 0.4 MG CAPS capsule Take 1 capsule (0.4 mg total) by mouth daily. 30 capsule 0   No current  facility-administered medications for this visit.    Allergies: Allergies  Allergen Reactions   Morphine Other (See Comments)    Nervous    Past Medical History:  Diagnosis Date   Allergic rhinitis    Arthritis    Back ,hips   Cancer (HCC)    Complication of anesthesia    awareness during general anesthesia.   Dysrhythmia    A-Fib and PSVT   Fatigue    History of atrial fibrillation    episode 2013 converted with cardizem-- happened at time acute illness due to kidney stone --  resolved  no issues since   History of kidney stones    History of sepsis    urosepsis  2013 due to kidney stone   Hypertension    Left nephrolithiasis    Left ureteral calculus    Low back pain    Neuromuscular disorder (HCC)    PAC (premature atrial contraction)    Scoliosis    Solitary lung nodule    benign right middle lobe per CT   Tremor    Past Surgical History:  Procedure Laterality Date   BACK SURGERY  2015   rod and screws   CATARACT EXTRACTION W/ INTRAOCULAR LENS  IMPLANT, BILATERAL  10/02/2008   CYSTOSCOPY WITH BIOPSY N/A 04/08/2019   Procedure: CYSTOSCOPY WITH bladder BIOPSY, FULGURATION;  Surgeon: Jerilee Field, MD;  Location: [facility];  Service: Urology;  Laterality: N/A;   CYSTOSCOPY WITH STENT PLACEMENT Left 09/15/2014   Procedure: CYSTOSCOPY WITH STENT PLACEMENT;  Surgeon: Doyne Keel, MD;  Location: [facility];  Service: Urology;  Laterality: Left;   CYSTOSCOPY/RETROGRADE/URETEROSCOPY Left 08/09/2014   Procedure: CYSTOSCOPY/RETROGRADE/LEFT STENT;  Surgeon: Valetta Fuller, MD;  Location: [facility];  Service: Urology;  Laterality: Left;   CYSTOSCOPY/URETEROSCOPY/HOLMIUM LASER/STENT PLACEMENT Left 09/15/2022   Procedure: CYSTOSCOPY LEFT URETEROSCOPY/STONE BASKET EXTRACTION;  Surgeon: Bjorn Pippin, MD;  Location: [facility];  Service: Urology;  Laterality: Left;  1 HR FOR CASE   EXTRACORPOREAL SHOCK WAVE LITHOTRIPSY  10/03/2011   HIP PINNING,CANNULATED Right  07/10/2023   Procedure: PERCUTANEOUS FIXATION OF FEMORAL NECK;  Surgeon: Vickki Hearing, MD;  Location: [facility];  Service: Orthopedics;  Laterality: Right;  c arm   HOLMIUM LASER APPLICATION Left 09/15/2014   Procedure: HOLMIUM LASER APPLICATION;  Surgeon: Doyne Keel, MD;  [facility];  Service: Urology;  Laterality: Left;   IR URETERAL STENT LEFT NEW ACCESS W/O SEP NEPHROSTOMY CATH  08/18/2022   LAPAROTOMY W/  UNILATERAL SALPINGOOPHORECTOMY  age 20   NEPHROLITHOTOMY Left 08/18/2022   Procedure: LEFT NEPHROLITHOTOMY PERCUTANEOUS;  Surgeon: Bjorn Pippin, MD;  Location: [facility];  Service: Urology;  Laterality: Left;  2.5 HRS FOR CASE   TOTAL ABDOMINAL HYSTERECTOMY  age 54   w/ unilateral salpingoophorectomy   TRANSTHORACIC ECHOCARDIOGRAM  02/13/2012   mild LVH/  ef 65%/  trivial MR  &  PR/  mild RVE   URETEROSCOPY Left 09/15/2014   Procedure: URETEROSCOPY;  Surgeon: Doyne Keel, MD;  Location: [facility];  Service: Urology;  Laterality: Left;   Family History  Problem Relation Age of Onset   Hypertension Mother    Cerebral aneurysm Father    Asthma Sister    Bladder Cancer Brother    Hypertension Brother    Bone cancer Brother    Cancer Other        Lung and bladder   Cancer Other        "Bone marrow cancer"   Social History   Socioeconomic History   Marital status: Married    Spouse name: Not on file   Number of children: 1   Years of education: Not on file   Highest education level: Not on file  Occupational History    Employer: RETIRED  Tobacco Use   Smoking status: Never    Passive exposure: Never   Smokeless tobacco: Never  Vaping Use   Vaping status: Never Used  Substance and Sexual Activity   Alcohol use: No   Drug use: No   Sexual activity: Not Currently  Other Topics Concern   Not on file  Social History Narrative   Not on file   Social Drivers of Health   Financial Resource Strain: Low Risk   (12/14/2022)   Overall Financial Resource Strain (CARDIA)    Difficulty of Paying Living Expenses: Not hard at all  Food Insecurity: No Food Insecurity (07/09/2023)   Hunger Vital Sign    Worried About Running Out of Food in the Last Year: Never true    Ran Out of Food in the Last Year: Never true  Transportation Needs: No Transportation Needs (07/19/2023)   [facility]   PRAPARE - Transportation    Lack of Transportation (Medical): No    Lack of Transportation (Non-Medical): No  Physical Activity: Inactive (12/14/2022)   Exercise Vital Sign    Days of Exercise per Week: 0 days    Minutes of Exercise per Session: 0 min  Stress: No Stress Concern Present (12/14/2022)   [employer] of Occupational Health - Occupational Stress Questionnaire    Feeling of Stress : Not at all  Social Connections: Socially Integrated (12/14/2022)   Social Connection and Isolation Panel [NHANES]    Frequency of Communication with Friends and Family: More than three times a week    Frequency of Social Gatherings with Friends and Family: More than three times a week    Attends Religious Services: 1 to 4 times per year    Active Member of [employer] or Organizations: Yes    Attends Banker Meetings: 1 to 4 times per year    Marital Status: Married  Catering manager Violence: Not At Risk (07/09/2023)   Humiliation, Afraid, Rape, and Kick questionnaire    Fear of Current or Ex-Partner: No    Emotionally Abused: No    Physically Abused: No    Sexually Abused: No    SUBJECTIVE  Review of Systems Constitutional: Patient denies any unintentional weight loss or change in strength lntegumentary: Patient denies any rashes or pruritus Cardiovascular: Patient denies chest pain or syncope Respiratory: Patient denies shortness of breath Gastrointestinal: As per HPI; reports chronic constipation Musculoskeletal: Patient denies muscle cramps or weakness Neurologic: Patient reports  intermittent tremor Hematologic/Lymphatic: Patient denies bleeding tendencies Endocrine: Patient denies heat/cold intolerance  GU: As per HPI.  OBJECTIVE Vitals:   10/18/23 1410  BP: (!) 179/80  Pulse: 86  Temp: 98.4 F (36.9 C)   There is no height or weight on file to calculate BMI.  Physical Examination Constitutional: No obvious distress; patient is  non-toxic appearing  Cardiovascular: No visible lower extremity edema.  Respiratory: The patient does not have audible wheezing/stridor; respirations do not appear labored  Gastrointestinal: Abdomen non-distended Musculoskeletal: Normal ROM of UEs  Skin: No obvious rashes/open sores  Neurologic: CN 2-12 grossly intact Psychiatric: Answered questions appropriately with anxious affect  Hematologic/Lymphatic/Immunologic: No obvious bruises or sites of spontaneous bleeding  Urine microscopy: >30 RBC/hpf with no evidence of UTI  ASSESSMENT Ureteral stone - Plan: Urinalysis, Routine w reflex microscopic  Kidney stone - Plan: CBC, Comprehensive metabolic panel  Malaise - Plan: CBC, Comprehensive metabolic panel  Bilateral hydronephrosis - Plan: CBC, Comprehensive metabolic panel  Ureteral calculus  We reviewed her recent results. The right UPJ stone seen on prior CT is not clearly seen on KUB or RUS however she has some persistent bilateral hydronephrosis (R > L) with some stones in both kidneys. No evidence of UTI today.  She was advised that Dr. Annabell Howells has advised right ureteroscopic stone manipulation. We discussed that procedure and possible risks / benefits including but not limited to: including pain, infection, sepsis, UTI, ureter perforation, need for stenting, post-op ureteral stricture, hematuria.   Per Dr. Annabell Howells repeat CT stone is not highly suggested however that can be considered for further evaluation to better characterize her current stone burden if she is not amenable to proceeding with ureteroscopy at this  time.  She is highly resistant to any future stone procedures. We discussed risks related to persistent hydronephrosis including but not limited to: UTls, pyelonephritis, urosepsis, kidney damage/ failure, decreased kidney function requiring dialysis, hastened demise.   She elected to hold off on CT or surgery at this time. She did agree to check blood work today since she is feeling unwell.   She was encouraged to contact urology provider if she elects to proceed with further stone evaluation / management in the future.   She was advised to go to the ER if She develops fever >100.5 F, uncontrollable pain, or other significantly concerning symptoms.  Pt verbalized understanding and agreement. All questions were answered.   PLAN Advised the following: Labs today (CBC & CMP). Return if symptoms worsen or fail to improve.  Orders Placed This Encounter  Procedures   Microscopic Examination   Urinalysis, Routine w reflex microscopic   CBC   Comprehensive metabolic panel   Total time spent caring for the patient today was over 30 minutes. This includes time spent on the date of the visit reviewing the patient's chart before the visit, time spent during the visit, and time spent after the visit on documentation. Over 50% of that time was spent in face-to-face time with this patient for direct counseling. E&M based on time and complexity of medical decision making.  It has been explained that the patient is to follow regularly with their PCP in addition to all other providers involved in their care and to follow instructions provided by these respective offices. Patient advised to contact urology clinic if any urologic-pertaining questions, concerns, new symptoms or problems arise in the interim period.  There are no Patient Instructions on file for this visit.  Electronically signed by:  Donnita Falls, MSN, FNP-C, CUNP 10/18/2023 3:54 PM

## 2023-10-15 NOTE — Telephone Encounter (Signed)
 Patient informed and verbalized understanding of plan. Short acting Cardizem 30 Mg q8h as needed for palpitations sent in to [facility] pharmacy

## 2023-10-15 NOTE — Telephone Encounter (Signed)
 Patient c/o Palpitations:  STAT if patient reporting lightheadedness, shortness of breath, or chest pain  How long have you had palpitations/irregular HR/ Afib? Are you having the symptoms now? Yes for about a week  Are you currently experiencing lightheadedness, SOB or CP? No   Do you have a history of afib (atrial fibrillation) or irregular heart rhythm? Yes   Have you checked your BP or HR? (document readings if available): 70's -80's  Are you experiencing any other symptoms? Nervous, fills bad

## 2023-10-15 NOTE — Telephone Encounter (Signed)
 What are your symptoms?Patient can feel heart beat fast and can feel she is out of rhythm Patient states her smart watch says she's been in afib 2 times today and has been for several days and is very nervous and shaky  How long has this been going on? 1 week  Do you have a Hx of Afib? Yes  Do you feel like you are in Afib? Yes on and off  BP and HR over the last 5 days-does not have any BP or HR to give BP and HR currently- 193/127 HR 75  Medications? Patient is currently taking all medications as prescribed other than she is taking diltiazem  short acting 60 mg some days not taking both each day she has only taken one or the other she states the extended release does not help fast enough  -Patient states she feels like she has at least 1 episode a day of going into afib

## 2023-10-16 ENCOUNTER — Telehealth: Payer: Self-pay | Admitting: Internal Medicine

## 2023-10-16 ENCOUNTER — Other Ambulatory Visit: Payer: PPO

## 2023-10-16 ENCOUNTER — Other Ambulatory Visit: Payer: Self-pay | Admitting: Internal Medicine

## 2023-10-16 ENCOUNTER — Telehealth: Payer: Self-pay

## 2023-10-16 DIAGNOSIS — R002 Palpitations: Secondary | ICD-10-CM

## 2023-10-16 NOTE — Telephone Encounter (Signed)
 Per provider-Dr. StaciaBETHA Beckwith, can you obtain two week event monitor, live. I am not sure of the duration of these episodes. Helpful to quantify the burden and to assess rates.   Spoke with daughter Rinda. Advised her that will mail monitor out to her will include instructions as well. Verbalized understanding.

## 2023-10-16 NOTE — Telephone Encounter (Signed)
 Checking percert on the following   Mailed monitor on 10/16/2023/[place] AT

## 2023-10-16 NOTE — Telephone Encounter (Signed)
 Call Pt to let her know per NP okay to continue Macrobid until 01/16 appt

## 2023-10-16 NOTE — Telephone Encounter (Signed)
 Pt daughter called stating Macrobid  prescribed then antibiotic changed when culture came back.Macrobid  seemed to be working. Pt said after the new Rx her moms' urine became cloudy and smells bad. As well as mom started not feeling good again. Can they continue Macrobid  until appt 01/16

## 2023-10-17 DIAGNOSIS — I10 Essential (primary) hypertension: Secondary | ICD-10-CM | POA: Diagnosis not present

## 2023-10-17 DIAGNOSIS — I700 Atherosclerosis of aorta: Secondary | ICD-10-CM | POA: Diagnosis not present

## 2023-10-17 DIAGNOSIS — D6869 Other thrombophilia: Secondary | ICD-10-CM | POA: Diagnosis not present

## 2023-10-17 DIAGNOSIS — G8929 Other chronic pain: Secondary | ICD-10-CM | POA: Diagnosis not present

## 2023-10-17 DIAGNOSIS — F3342 Major depressive disorder, recurrent, in full remission: Secondary | ICD-10-CM | POA: Diagnosis not present

## 2023-10-17 DIAGNOSIS — E876 Hypokalemia: Secondary | ICD-10-CM | POA: Diagnosis not present

## 2023-10-17 DIAGNOSIS — I509 Heart failure, unspecified: Secondary | ICD-10-CM | POA: Diagnosis not present

## 2023-10-17 DIAGNOSIS — E261 Secondary hyperaldosteronism: Secondary | ICD-10-CM | POA: Diagnosis not present

## 2023-10-17 DIAGNOSIS — I4891 Unspecified atrial fibrillation: Secondary | ICD-10-CM | POA: Diagnosis not present

## 2023-10-17 DIAGNOSIS — F419 Anxiety disorder, unspecified: Secondary | ICD-10-CM | POA: Diagnosis not present

## 2023-10-17 DIAGNOSIS — F1120 Opioid dependence, uncomplicated: Secondary | ICD-10-CM | POA: Diagnosis not present

## 2023-10-17 DIAGNOSIS — I110 Hypertensive heart disease with heart failure: Secondary | ICD-10-CM | POA: Diagnosis not present

## 2023-10-18 ENCOUNTER — Ambulatory Visit: Payer: PPO | Admitting: Urology

## 2023-10-18 VITALS — BP 179/80 | HR 86 | Temp 98.4°F

## 2023-10-18 DIAGNOSIS — R5381 Other malaise: Secondary | ICD-10-CM

## 2023-10-18 DIAGNOSIS — N200 Calculus of kidney: Secondary | ICD-10-CM | POA: Diagnosis not present

## 2023-10-18 DIAGNOSIS — N1330 Unspecified hydronephrosis: Secondary | ICD-10-CM | POA: Diagnosis not present

## 2023-10-18 DIAGNOSIS — N201 Calculus of ureter: Secondary | ICD-10-CM

## 2023-10-18 LAB — MICROSCOPIC EXAMINATION
Bacteria, UA: NONE SEEN
RBC, Urine: >30 /[HPF] — AB (ref 0–2)

## 2023-10-18 LAB — URINALYSIS, ROUTINE W REFLEX MICROSCOPIC
Bilirubin, UA: NEGATIVE
Glucose, UA: NEGATIVE
Ketones, UA: NEGATIVE
Leukocytes,UA: NEGATIVE
Nitrite, UA: NEGATIVE
Protein,UA: NEGATIVE
Specific Gravity, UA: 1.01 (ref 1.005–1.030)
Urobilinogen, Ur: 0.2 mg/dL (ref 0.2–1.0)
pH, UA: 6.5 (ref 5.0–7.5)

## 2023-10-19 ENCOUNTER — Telehealth: Payer: Self-pay

## 2023-10-19 LAB — CBC
Hematocrit: 35 % (ref 34.0–46.6)
Hemoglobin: 11.4 g/dL (ref 11.1–15.9)
MCH: 31.4 pg (ref 26.6–33.0)
MCHC: 32.6 g/dL (ref 31.5–35.7)
MCV: 96 fL (ref 79–97)
Platelets: 357 10*3/uL (ref 150–450)
RBC: 3.63 x10E6/uL — ABNORMAL LOW (ref 3.77–5.28)
RDW: 12.4 % (ref 11.7–15.4)
WBC: 6 10*3/uL (ref 3.4–10.8)

## 2023-10-19 LAB — COMPREHENSIVE METABOLIC PANEL WITH GFR
ALT: 15 IU/L (ref 0–32)
AST: 23 IU/L (ref 0–40)
Albumin: 4.3 g/dL (ref 3.7–4.7)
Alkaline Phosphatase: 81 IU/L (ref 44–121)
BUN/Creatinine Ratio: 11 — ABNORMAL LOW (ref 12–28)
BUN: 8 mg/dL (ref 8–27)
Bilirubin Total: 0.4 mg/dL (ref 0.0–1.2)
CO2: 24 mmol/L (ref 20–29)
Calcium: 9.4 mg/dL (ref 8.7–10.3)
Chloride: 101 mmol/L (ref 96–106)
Creatinine, Ser: 0.71 mg/dL (ref 0.57–1.00)
Globulin, Total: 4.4 g/dL (ref 1.5–4.5)
Glucose: 106 mg/dL — ABNORMAL HIGH (ref 70–99)
Potassium: 4.1 mmol/L (ref 3.5–5.2)
Sodium: 141 mmol/L (ref 134–144)
Total Protein: 8.7 g/dL — ABNORMAL HIGH (ref 6.0–8.5)
eGFR: 83 mL/min/1.73

## 2023-10-19 NOTE — Telephone Encounter (Signed)
 Patient/daughter was made aware and voiced understanding.

## 2023-10-19 NOTE — Telephone Encounter (Signed)
-----   Message from Tanya Mueller sent at 10/19/2023 12:56 PM EST ----- Please let pt know that her labs showed no acute findings and appear stable compared to prior. Normal renal function, liver function, electrolytes, and WBC count.

## 2023-10-24 DIAGNOSIS — R002 Palpitations: Secondary | ICD-10-CM

## 2023-10-25 DIAGNOSIS — R002 Palpitations: Secondary | ICD-10-CM | POA: Diagnosis not present

## 2023-10-29 ENCOUNTER — Other Ambulatory Visit: Payer: Self-pay | Admitting: *Deleted

## 2023-10-29 MED ORDER — APIXABAN 5 MG PO TABS: 5.0000 mg | ORAL_TABLET | Freq: Two times a day (BID) | ORAL | 5 refills | Status: DC

## 2023-10-29 NOTE — Telephone Encounter (Signed)
Prescription refill request for Eliquis received. Indication: A Flutter Last office visit: 09/04/23  V Mallipeddi MD Scr:  0.71 on 10/18/23  Epic Age: 89 Weight: 61.1kg  Based on above findings Eliquis 5mg  twice daily is the appropriate dose.  Refill approved.

## 2023-11-08 DIAGNOSIS — F411 Generalized anxiety disorder: Secondary | ICD-10-CM | POA: Diagnosis not present

## 2023-11-08 DIAGNOSIS — I480 Paroxysmal atrial fibrillation: Secondary | ICD-10-CM | POA: Diagnosis not present

## 2023-11-08 DIAGNOSIS — Z6820 Body mass index (BMI) 20.0-20.9, adult: Secondary | ICD-10-CM | POA: Diagnosis not present

## 2023-11-08 DIAGNOSIS — M25551 Pain in right hip: Secondary | ICD-10-CM | POA: Diagnosis not present

## 2023-11-08 DIAGNOSIS — M48061 Spinal stenosis, lumbar region without neurogenic claudication: Secondary | ICD-10-CM | POA: Diagnosis not present

## 2023-11-15 ENCOUNTER — Other Ambulatory Visit: Payer: Self-pay | Admitting: Gastroenterology

## 2023-11-15 DIAGNOSIS — T402X5A Adverse effect of other opioids, initial encounter: Secondary | ICD-10-CM

## 2023-11-15 DIAGNOSIS — K581 Irritable bowel syndrome with constipation: Secondary | ICD-10-CM

## 2023-11-19 ENCOUNTER — Other Ambulatory Visit (INDEPENDENT_AMBULATORY_CARE_PROVIDER_SITE_OTHER): Payer: Self-pay

## 2023-11-19 ENCOUNTER — Ambulatory Visit: Payer: PPO | Admitting: Orthopedic Surgery

## 2023-11-19 DIAGNOSIS — S72001D Fracture of unspecified part of neck of right femur, subsequent encounter for closed fracture with routine healing: Secondary | ICD-10-CM | POA: Diagnosis not present

## 2023-11-19 NOTE — Progress Notes (Signed)
   There were no vitals taken for this visit.  There is no height or weight on file to calculate BMI.  Chief Complaint  Patient presents with   Hip Pain    RIGHT 3 PIN PLACEMENT 07/08/23     Encounter Diagnosis  Name Primary?   Closed fracture of right hip with routine healing, subsequent encounter: otif  (07/08/23) (3 pin/ screws) Yes    DOI/DOS/ Date: 07/08/23  Worse

## 2023-11-19 NOTE — Patient Instructions (Signed)
While we are working on your approval forCT please go ahead and call to schedule your appointment with [facility] within at least one (1) week.   Central Scheduling [phone]  

## 2023-11-19 NOTE — Progress Notes (Signed)
 Office Visit Note   Patient: Tanya Mueller           Date of Birth: [birth year]           MRN: [id number] Visit Date: 11/19/2023 Requested by: Sasser, Clarene Critchley, MD [address] [place],  [place] [address] PCP: Estanislado Pandy, MD   Assessment & Plan:   Encounter Diagnoses  Name Primary?   Closed fracture of right hip with routine healing, subsequent encounter: otif  (07/08/23) (3 pin/ screws) Yes   Closed fracture of neck of right femur with routine healing, subsequent encounter     No orders of the defined types were placed in this encounter.   Possible nonunion right hip status post closed reduction percutaneous pinning  Recommend CT scan to evaluate fracture   Subjective: Chief Complaint  Patient presents with   Hip Pain    RIGHT 3 PIN PLACEMENT 07/08/23     HPI: 4-1/70-month status post ORIF right hip with cannulated screws complains of increasing pain and decreasing function after a period of improvement pain in the groin pain in the right leg pain radiating down the right leg pain right thigh and medial thigh              ROS: No new symptoms other than stated, chronic pain   Images personally read and my interpretation :  DG HIP UNILAT WITH PELVIS 2-3 VIEWS RIGHT Result Date: 11/19/2023 Report right hip ORIF with 3 cannulated screws back in October X-rays compared posteriorly over the last 4 months Fracture appears to be going into varus and nonunion Impression possible nonunion varus collapse of ORIF right hip with 3 cannulated screws     Visit Diagnoses:  1. Closed fracture of right hip with routine healing, subsequent encounter: otif  (07/08/23) (3 pin/ screws)   2. Closed fracture of neck of right femur with routine healing, subsequent encounter      Follow-Up Instructions: No follow-ups on file.    Objective: Vital Signs: There were no vitals taken for this visit.   Examination of the right hip patient has  Equal leg lengths  125 degrees of flexion  mild pain  15 degrees internal rotation moderate pain Today ambulated into the office in a wheelchair but uses a walker at home  Specialty Comments:  No specialty comments available.  Imaging: DG HIP UNILAT WITH PELVIS 2-3 VIEWS RIGHT Result Date: 11/19/2023 Report right hip ORIF with 3 cannulated screws back in October X-rays compared posteriorly over the last 4 months Fracture appears to be going into varus and nonunion Impression possible nonunion varus collapse of ORIF right hip with 3 cannulated screws     PMFS History: Patient Active Problem List   Diagnosis Date Noted   Closed fracture of right hip (HCC) 08/08/2023   Right femoral fracture (HCC) 07/12/2023   Status post hip surgery 07/12/2023   Hip pain, acute, right 07/09/2023   Acquired dilation of right ventricle of heart 03/19/2023   (HFpEF) heart failure with preserved ejection fraction (HCC) 03/19/2023   Chronic pain 12/10/2022   Prolonged QT interval 12/10/2022   Atrial flutter (HCC) 12/06/2022   Atrial fibrillation (HCC) 09/16/2022   Paroxysmal atrial fibrillation with RVR (HCC) 09/15/2022   Leukopenia 09/15/2022   Elevated brain natriuretic peptide (BNP) level 09/15/2022   Macrocytic anemia 09/15/2022   Therapeutic opioid-induced constipation (OIC) 05/04/2022   Ankle swelling 12/15/2020   Essential hypertension 12/15/2020   Lumbar spondylosis with myelopathy 06/26/2017  Scoliosis deformity of spine 06/26/2017   Hypokalemia 04/30/2017   Hypomagnesemia 04/30/2017   Hyponatremia 04/30/2017   PVC's (premature ventricular contractions) 04/30/2017   Shortness of breath 04/28/2017   Kidney stone 08/09/2014   Ureteral calculus 08/09/2014   Palpitations 05/06/2012   Past Medical History:  Diagnosis Date   Allergic rhinitis    Arthritis    Back ,hips   Cancer (HCC)    Complication of anesthesia    awareness during general anesthesia.   Dysrhythmia    A-Fib and PSVT   Fatigue    History of atrial  fibrillation    episode 2013 converted with cardizem-- happened at time acute illness due to kidney stone --  resolved  no issues since   History of kidney stones    History of sepsis    urosepsis  2013 due to kidney stone   Hypertension    Left nephrolithiasis    Left ureteral calculus    Low back pain    Neuromuscular disorder (HCC)    PAC (premature atrial contraction)    Scoliosis    Solitary lung nodule    benign right middle lobe per CT   Tremor     Family History  Problem Relation Age of Onset   Hypertension Mother    Cerebral aneurysm Father    Asthma Sister    Bladder Cancer Brother    Hypertension Brother    Bone cancer Brother    Cancer Other        Lung and bladder   Cancer Other        "Bone marrow cancer"    Past Surgical History:  Procedure Laterality Date   BACK SURGERY  2015   rod and screws   CATARACT EXTRACTION W/ INTRAOCULAR LENS  IMPLANT, BILATERAL  10/02/2008   CYSTOSCOPY WITH BIOPSY N/A 04/08/2019   Procedure: CYSTOSCOPY WITH bladder BIOPSY, FULGURATION;  Surgeon: Jerilee Field, MD;  Location: [facility];  Service: Urology;  Laterality: N/A;   CYSTOSCOPY WITH STENT PLACEMENT Left 09/15/2014   Procedure: CYSTOSCOPY WITH STENT PLACEMENT;  Surgeon: Doyne Keel, MD;  Location: [facility];  Service: Urology;  Laterality: Left;   CYSTOSCOPY/RETROGRADE/URETEROSCOPY Left 08/09/2014   Procedure: CYSTOSCOPY/RETROGRADE/LEFT STENT;  Surgeon: Valetta Fuller, MD;  Location: [facility];  Service: Urology;  Laterality: Left;   CYSTOSCOPY/URETEROSCOPY/HOLMIUM LASER/STENT PLACEMENT Left 09/15/2022   Procedure: CYSTOSCOPY LEFT URETEROSCOPY/STONE BASKET EXTRACTION;  Surgeon: Bjorn Pippin, MD;  Location: [facility];  Service: Urology;  Laterality: Left;  1 HR FOR CASE   EXTRACORPOREAL SHOCK WAVE LITHOTRIPSY  10/03/2011   HIP PINNING,CANNULATED Right 07/10/2023   Procedure: PERCUTANEOUS FIXATION OF FEMORAL NECK;  Surgeon: Vickki Hearing, MD;  Location:  [facility];  Service: Orthopedics;  Laterality: Right;  c arm   HOLMIUM LASER APPLICATION Left 09/15/2014   Procedure: HOLMIUM LASER APPLICATION;  Surgeon: Doyne Keel, MD;  Location: [facility];  Service: Urology;  Laterality: Left;   IR URETERAL STENT LEFT NEW ACCESS W/O SEP NEPHROSTOMY CATH  08/18/2022   LAPAROTOMY W/  UNILATERAL SALPINGOOPHORECTOMY  age 63   NEPHROLITHOTOMY Left 08/18/2022   Procedure: LEFT NEPHROLITHOTOMY PERCUTANEOUS;  Surgeon: Bjorn Pippin, MD;  Location: [facility];  Service: Urology;  Laterality: Left;  2.5 HRS FOR CASE   TOTAL ABDOMINAL HYSTERECTOMY  age 58   w/ unilateral salpingoophorectomy   TRANSTHORACIC ECHOCARDIOGRAM  02/13/2012   mild LVH/  ef 65%/  trivial MR  &  PR/  mild RVE   URETEROSCOPY  Left 09/15/2014   Procedure: URETEROSCOPY;  Surgeon: Doyne Keel, MD;  Location: [facility];  Service: Urology;  Laterality: Left;   Social History   Occupational History    Employer: RETIRED  Tobacco Use   Smoking status: Never    Passive exposure: Never   Smokeless tobacco: Never  Vaping Use   Vaping status: Never Used  Substance and Sexual Activity   Alcohol use: No   Drug use: No   Sexual activity: Not Currently

## 2023-11-23 ENCOUNTER — Ambulatory Visit (HOSPITAL_COMMUNITY)
Admission: RE | Admit: 2023-11-23 | Discharge: 2023-11-23 | Disposition: A | Discharge status: Home / Self Care | Payer: PPO | Source: Ambulatory Visit | Attending: Orthopedic Surgery | Admitting: Orthopedic Surgery

## 2023-11-23 DIAGNOSIS — S72001D Fracture of unspecified part of neck of right femur, subsequent encounter for closed fracture with routine healing: Secondary | ICD-10-CM | POA: Diagnosis not present

## 2023-11-23 DIAGNOSIS — M1611 Unilateral primary osteoarthritis, right hip: Secondary | ICD-10-CM | POA: Diagnosis not present

## 2023-11-23 DIAGNOSIS — T84050A Periprosthetic osteolysis of internal prosthetic right hip joint, initial encounter: Secondary | ICD-10-CM | POA: Diagnosis not present

## 2023-11-23 DIAGNOSIS — M85851 Other specified disorders of bone density and structure, right thigh: Secondary | ICD-10-CM | POA: Diagnosis not present

## 2023-12-07 ENCOUNTER — Ambulatory Visit: Payer: PPO | Admitting: Orthopedic Surgery

## 2023-12-07 ENCOUNTER — Encounter: Payer: Self-pay | Admitting: Orthopedic Surgery

## 2023-12-07 DIAGNOSIS — S72001K Fracture of unspecified part of neck of right femur, subsequent encounter for closed fracture with nonunion: Secondary | ICD-10-CM

## 2023-12-07 MED ORDER — VITAMIN D3 125 MCG (5000 UT) PO CAPS: 1.0000 | ORAL_CAPSULE | Freq: Every day | ORAL | 0 refills | Status: AC

## 2023-12-07 NOTE — Progress Notes (Signed)
   There were no vitals taken for this visit.  There is no height or weight on file to calculate BMI.  Chief Complaint  Patient presents with   Results    Review CT scan right hip     No diagnosis found.  DOI/DOS/ Date: 07/08/23  Unchanged

## 2023-12-07 NOTE — Progress Notes (Signed)
   Chief Complaint  Patient presents with   Results    Review CT scan right hip     89 year old female status post closed reduction percutaneous pinning/open treatment internal fixation right femoral neck fracture on July 08, 2023  Patient began having increased pain in the right hip and was sent for CT scan after x-rays were inconclusive as to whether the fracture had completely healed.  CT scan was reviewed and the images show that the fracture is a nonunion with no change in hardware position    I reviewed the images with the patient and her daughter.  I presented options for treatment  1.  Hardware removal with total hip replacement or bipolar replacement  2.  The patient actually asked what would happen if we did not remove the screws and replaced the hip.  I suggested that she would have continued pain potential for hardware loosening and displacement of the nondisplaced fracture.  She is unclear what she wants to do.  She has been in a lot of pain from chronic back pain and the recent increase in pain is also caused her a lot of discomfort.  She is ambulatory with her walker.  Her daughter says sometimes she has to tell her to stop doing so much  I also recommended we start some vitamin D with calcium to maybe get some bone healing as she does not want to have surgery at this time  She is left with the option of calling me if she changes her mind to schedule surgery for March 19 or if not to follow her in 4 weeks and x-ray again.   Meds ordered this encounter  Medications   Cholecalciferol (VITAMIN D3) 125 MCG (5000 UT) CAPS    Sig: Take 1 capsule (5,000 Units total) by mouth daily at 6 (six) AM.    Dispense:  30 capsule    Refill:  0

## 2023-12-07 NOTE — Progress Notes (Signed)
 Tanya Mueller

## 2023-12-14 ENCOUNTER — Telehealth: Payer: Self-pay

## 2023-12-14 MED ORDER — DILTIAZEM HCL ER 180 MG PO TB24: 180.0000 mg | ORAL_TABLET | Freq: Every day | ORAL | 5 refills | Status: DC

## 2023-12-14 NOTE — Telephone Encounter (Signed)
-----   Message from Vishnu P Mallipeddi sent at 12/14/2023  9:41 AM EDT ----- 4.9% PAC burden, 2.2% PVC burden, 37 runs of nonsustained SVT and 1 run of NSVT.  Average HR 66 bpm.  Increase diltiazem from 120 mg to 180 mg once daily.

## 2023-12-14 NOTE — Telephone Encounter (Signed)
 Spoke with daughter she received a call from the pharmacy stating that the medication was different from what was sent in previously. I verified that it was still a long acting or extended release and that she is taking once daily.

## 2023-12-14 NOTE — Telephone Encounter (Signed)
 The patient has been notified of the result and verbalized understanding.  All questions (if any) were answered. Carmelina Paddock, [place] 12/14/2023 10:09 AM

## 2023-12-14 NOTE — Telephone Encounter (Signed)
Daughter was calling back with questions. Please advise  ?

## 2023-12-17 ENCOUNTER — Other Ambulatory Visit: Payer: Self-pay | Admitting: Gastroenterology

## 2023-12-17 DIAGNOSIS — K581 Irritable bowel syndrome with constipation: Secondary | ICD-10-CM

## 2023-12-17 DIAGNOSIS — T402X5A Adverse effect of other opioids, initial encounter: Secondary | ICD-10-CM

## 2023-12-24 ENCOUNTER — Telehealth: Payer: Self-pay | Admitting: Internal Medicine

## 2023-12-24 MED ORDER — DILTIAZEM HCL ER COATED BEADS 180 MG PO CP24: 180.0000 mg | ORAL_CAPSULE | Freq: Every day | ORAL | 5 refills | Status: DC

## 2023-12-24 NOTE — Telephone Encounter (Signed)
 Pt c/o medication issue:  1. Name of Medication: diltiazem (CARDIZEM LA) 180 MG 24 hr tablet   2. How are you currently taking this medication (dosage and times per day)? Take 1 tablet (180 mg total) by mouth daily.   3. Are you having a reaction (difficulty breathing--STAT)? No  4. What is your medication issue? Patient's daughter stated when picking up the medication, the copay for the tablet form is going to be $50. Patient's daughter is requesting for [place] to send the prescription for the capsule tablets due to the copay only being $5. Patient's daughter requested a call back in regard to this. Please advise.

## 2023-12-24 NOTE — Telephone Encounter (Signed)
Daughter aware and verbalized understanding

## 2023-12-24 NOTE — Telephone Encounter (Signed)
 Tried to call and let daughter Rinda know that the medication has been updated. Unable to leave a message as to voicemail was full

## 2024-01-02 ENCOUNTER — Telehealth: Payer: Self-pay | Admitting: Internal Medicine

## 2024-01-02 NOTE — Telephone Encounter (Signed)
 Patient's daughter called to make an appt.  I called her back no answer, voice mail is full

## 2024-01-04 ENCOUNTER — Ambulatory Visit: Admitting: Orthopedic Surgery

## 2024-01-04 ENCOUNTER — Other Ambulatory Visit (INDEPENDENT_AMBULATORY_CARE_PROVIDER_SITE_OTHER): Payer: Self-pay

## 2024-01-04 DIAGNOSIS — S72001K Fracture of unspecified part of neck of right femur, subsequent encounter for closed fracture with nonunion: Secondary | ICD-10-CM

## 2024-01-04 NOTE — Progress Notes (Signed)
   There were no vitals taken for this visit.  There is no height or weight on file to calculate BMI.  No chief complaint on file.   Encounter Diagnosis  Name Primary?   Closed fracture of right hip with nonunion, subsequent encounter OTIF 07/10/23 Yes    DOI/DOS/ 4 week follow up  Worse depends on what has been done

## 2024-01-04 NOTE — Progress Notes (Signed)
 Patient ID: Tanya Mueller, female   DOB: [birth year], 89 y.o.   MRN: [id number]  Chief Complaint  Patient presents with   Hip Pain    Right hip pain. Follow 4 weeks.    Encounter Diagnosis  Name Primary?   Closed fracture of right hip with nonunion, subsequent encounter OTIF 07/10/23 Yes    Tanya Mueller is 88 soon-to-be 89.  We had a long discussion regarding her right hip nonunion  Her family is moving to [place] and wished to have surgery they are to remove the screws and perform total hip replacement  (Or bipolar replacement)  She has had mostly lateral thigh pain mild groin pain  She has pretty good hip flexion with minimal groin symptoms  X-rays today show that the fracture line is more visible  I printed off records for her to take with her to the orthopedic practice and [place] with x-rays

## 2024-01-18 ENCOUNTER — Other Ambulatory Visit: Payer: Self-pay | Admitting: Gastroenterology

## 2024-01-18 DIAGNOSIS — K581 Irritable bowel syndrome with constipation: Secondary | ICD-10-CM

## 2024-01-18 DIAGNOSIS — T402X5A Adverse effect of other opioids, initial encounter: Secondary | ICD-10-CM

## 2024-01-28 DIAGNOSIS — S72031A Displaced midcervical fracture of right femur, initial encounter for closed fracture: Secondary | ICD-10-CM | POA: Diagnosis not present

## 2024-02-15 ENCOUNTER — Other Ambulatory Visit: Payer: Self-pay | Admitting: Gastroenterology

## 2024-02-15 DIAGNOSIS — T402X5A Adverse effect of other opioids, initial encounter: Secondary | ICD-10-CM

## 2024-02-15 DIAGNOSIS — K581 Irritable bowel syndrome with constipation: Secondary | ICD-10-CM

## 2024-05-15 ENCOUNTER — Other Ambulatory Visit: Payer: Self-pay | Admitting: Internal Medicine

## 2024-05-15 DIAGNOSIS — I4892 Unspecified atrial flutter: Secondary | ICD-10-CM

## 2024-05-15 NOTE — Telephone Encounter (Signed)
 Prescription refill request for Eliquis  received. Indication: Aflutter Last office visit: 09/04/23 (Mallipedi)  Scr: 0.71 (10/18/23)  Age: 89 Weight: 62.1kg  Appropriate dose. Refill sent.

## 2024-05-27 ENCOUNTER — Other Ambulatory Visit: Payer: Self-pay | Admitting: Internal Medicine

## 2024-10-23 ENCOUNTER — Other Ambulatory Visit: Payer: Self-pay | Admitting: Internal Medicine
